# Patient Record
Sex: Male | Born: 1935
Health system: Southern US, Community
[De-identification: ages and names within clinical notes are randomized; demographics above are authoritative.]

## PROBLEM LIST (undated history)

## (undated) DIAGNOSIS — I2699 Other pulmonary embolism without acute cor pulmonale: Secondary | ICD-10-CM

## (undated) DIAGNOSIS — H409 Unspecified glaucoma: Secondary | ICD-10-CM

## (undated) DIAGNOSIS — K5900 Constipation, unspecified: Secondary | ICD-10-CM

## (undated) DIAGNOSIS — I10 Essential (primary) hypertension: Secondary | ICD-10-CM

## (undated) DIAGNOSIS — C439 Malignant melanoma of skin, unspecified: Secondary | ICD-10-CM

## (undated) DIAGNOSIS — E785 Hyperlipidemia, unspecified: Secondary | ICD-10-CM

## (undated) DIAGNOSIS — C61 Malignant neoplasm of prostate: Secondary | ICD-10-CM

## (undated) DIAGNOSIS — I341 Nonrheumatic mitral (valve) prolapse: Secondary | ICD-10-CM

## (undated) DIAGNOSIS — I82409 Acute embolism and thrombosis of unspecified deep veins of unspecified lower extremity: Secondary | ICD-10-CM

## (undated) DIAGNOSIS — J302 Other seasonal allergic rhinitis: Secondary | ICD-10-CM

## (undated) HISTORY — PX: CATARACT EXTRACTION: SUR2

## (undated) HISTORY — DX: Hyperlipidemia, unspecified: E78.5

## (undated) HISTORY — PX: EYE SURGERY: SHX253

## (undated) HISTORY — PX: COLONOSCOPY: SHX174

---

## 1998-05-03 HISTORY — PX: PROSTATECTOMY: SHX69

## 2002-11-21 ENCOUNTER — Encounter: Payer: Self-pay | Admitting: Emergency Medicine

## 2002-11-21 ENCOUNTER — Observation Stay (HOSPITAL_COMMUNITY): Admission: EM | Admit: 2002-11-21 | Discharge: 2002-11-22 | Payer: Self-pay | Admitting: Emergency Medicine

## 2004-10-26 ENCOUNTER — Ambulatory Visit: Payer: Self-pay | Admitting: Internal Medicine

## 2004-11-25 ENCOUNTER — Ambulatory Visit: Payer: Self-pay | Admitting: Internal Medicine

## 2004-12-24 ENCOUNTER — Ambulatory Visit: Payer: Self-pay | Admitting: Internal Medicine

## 2004-12-30 ENCOUNTER — Ambulatory Visit: Payer: Self-pay | Admitting: Internal Medicine

## 2004-12-30 ENCOUNTER — Encounter: Payer: Self-pay | Admitting: Internal Medicine

## 2005-03-04 ENCOUNTER — Ambulatory Visit: Payer: Self-pay | Admitting: Internal Medicine

## 2005-04-05 ENCOUNTER — Ambulatory Visit: Payer: Self-pay | Admitting: Internal Medicine

## 2005-09-14 ENCOUNTER — Ambulatory Visit: Payer: Self-pay | Admitting: Internal Medicine

## 2006-03-15 ENCOUNTER — Ambulatory Visit: Payer: Self-pay | Admitting: Internal Medicine

## 2006-03-25 ENCOUNTER — Ambulatory Visit: Payer: Self-pay | Admitting: Internal Medicine

## 2006-03-25 LAB — CONVERTED CEMR LAB
ALT: 23 units/L (ref 0–40)
BUN: 13 mg/dL (ref 6–23)
Creatinine, Ser: 1.1 mg/dL (ref 0.4–1.5)
LDL Cholesterol: 118 mg/dL — ABNORMAL HIGH (ref 0–99)
VLDL: 32 mg/dL (ref 0–40)

## 2006-06-28 ENCOUNTER — Ambulatory Visit: Payer: Self-pay | Admitting: Internal Medicine

## 2006-06-28 ENCOUNTER — Ambulatory Visit: Payer: Self-pay

## 2006-07-11 ENCOUNTER — Encounter: Admission: RE | Admit: 2006-07-11 | Discharge: 2006-07-11 | Payer: Self-pay | Admitting: Internal Medicine

## 2006-07-11 ENCOUNTER — Ambulatory Visit: Payer: Self-pay | Admitting: Internal Medicine

## 2006-10-04 ENCOUNTER — Encounter: Payer: Self-pay | Admitting: Internal Medicine

## 2006-10-05 DIAGNOSIS — I341 Nonrheumatic mitral (valve) prolapse: Secondary | ICD-10-CM | POA: Insufficient documentation

## 2006-10-27 ENCOUNTER — Ambulatory Visit: Payer: Self-pay | Admitting: Internal Medicine

## 2006-10-27 DIAGNOSIS — I82409 Acute embolism and thrombosis of unspecified deep veins of unspecified lower extremity: Secondary | ICD-10-CM | POA: Insufficient documentation

## 2006-10-27 DIAGNOSIS — Z8546 Personal history of malignant neoplasm of prostate: Secondary | ICD-10-CM | POA: Insufficient documentation

## 2006-10-27 DIAGNOSIS — E785 Hyperlipidemia, unspecified: Secondary | ICD-10-CM | POA: Insufficient documentation

## 2006-12-28 ENCOUNTER — Encounter: Payer: Self-pay | Admitting: Internal Medicine

## 2007-03-09 ENCOUNTER — Ambulatory Visit: Payer: Self-pay | Admitting: Internal Medicine

## 2007-03-13 LAB — CONVERTED CEMR LAB
BUN: 10 mg/dL (ref 6–23)
CO2: 30 meq/L (ref 19–32)
Calcium: 9.7 mg/dL (ref 8.4–10.5)
Cholesterol: 193 mg/dL (ref 0–200)
GFR calc Af Amer: 122 mL/min
GFR calc non Af Amer: 101 mL/min
Glucose, Bld: 95 mg/dL (ref 70–99)
Hemoglobin: 15.9 g/dL (ref 13.0–17.0)
LDL Cholesterol: 122 mg/dL — ABNORMAL HIGH (ref 0–99)
Potassium: 4.7 meq/L (ref 3.5–5.1)
Total CHOL/HDL Ratio: 4.7
Triglycerides: 146 mg/dL (ref 0–149)

## 2007-09-07 ENCOUNTER — Ambulatory Visit: Payer: Self-pay | Admitting: *Deleted

## 2007-09-07 ENCOUNTER — Emergency Department (HOSPITAL_COMMUNITY): Admission: EM | Admit: 2007-09-07 | Discharge: 2007-09-07 | Payer: Self-pay | Admitting: Emergency Medicine

## 2007-09-07 ENCOUNTER — Telehealth: Payer: Self-pay | Admitting: Internal Medicine

## 2007-09-07 ENCOUNTER — Encounter (INDEPENDENT_AMBULATORY_CARE_PROVIDER_SITE_OTHER): Payer: Self-pay | Admitting: Emergency Medicine

## 2007-09-13 ENCOUNTER — Inpatient Hospital Stay (HOSPITAL_COMMUNITY): Admission: AD | Admit: 2007-09-13 | Discharge: 2007-09-16 | Payer: Self-pay | Admitting: Internal Medicine

## 2007-09-13 ENCOUNTER — Ambulatory Visit: Payer: Self-pay | Admitting: Internal Medicine

## 2007-09-13 ENCOUNTER — Ambulatory Visit: Payer: Self-pay | Admitting: Cardiology

## 2007-09-13 ENCOUNTER — Telehealth (INDEPENDENT_AMBULATORY_CARE_PROVIDER_SITE_OTHER): Payer: Self-pay | Admitting: *Deleted

## 2007-09-13 DIAGNOSIS — Z86718 Personal history of other venous thrombosis and embolism: Secondary | ICD-10-CM

## 2007-09-13 LAB — CONVERTED CEMR LAB
BUN: 9 mg/dL (ref 6–23)
Basophils Absolute: 0 10*3/uL (ref 0.0–0.1)
Basophils Relative: 0.1 % (ref 0.0–1.0)
Eosinophils Absolute: 0.1 10*3/uL (ref 0.0–0.7)
Hemoglobin: 15 g/dL (ref 13.0–17.0)
Lymphocytes Relative: 11.5 % — ABNORMAL LOW (ref 12.0–46.0)
MCHC: 34.1 g/dL (ref 30.0–36.0)
MCV: 97.9 fL (ref 78.0–100.0)
Monocytes Absolute: 1.1 10*3/uL — ABNORMAL HIGH (ref 0.1–1.0)
Neutro Abs: 6 10*3/uL (ref 1.4–7.7)
RBC: 4.49 M/uL (ref 4.22–5.81)
RDW: 11.5 % (ref 11.5–14.6)

## 2007-09-14 ENCOUNTER — Ambulatory Visit: Payer: Self-pay | Admitting: Vascular Surgery

## 2007-09-14 ENCOUNTER — Encounter: Payer: Self-pay | Admitting: Internal Medicine

## 2007-09-14 ENCOUNTER — Ambulatory Visit: Payer: Self-pay | Admitting: Internal Medicine

## 2007-09-18 ENCOUNTER — Telehealth (INDEPENDENT_AMBULATORY_CARE_PROVIDER_SITE_OTHER): Payer: Self-pay | Admitting: *Deleted

## 2007-09-18 ENCOUNTER — Ambulatory Visit: Payer: Self-pay | Admitting: Internal Medicine

## 2007-09-22 ENCOUNTER — Ambulatory Visit: Payer: Self-pay | Admitting: Internal Medicine

## 2007-09-22 DIAGNOSIS — Z86711 Personal history of pulmonary embolism: Secondary | ICD-10-CM | POA: Insufficient documentation

## 2007-09-26 ENCOUNTER — Ambulatory Visit: Payer: Self-pay | Admitting: Internal Medicine

## 2007-09-26 LAB — CONVERTED CEMR LAB
INR: 8
Prothrombin Time: 33.9 s

## 2007-10-03 ENCOUNTER — Ambulatory Visit: Payer: Self-pay | Admitting: Internal Medicine

## 2007-10-03 LAB — CONVERTED CEMR LAB
INR: 2.3
Prothrombin Time: 18.6 s

## 2007-10-11 ENCOUNTER — Ambulatory Visit: Payer: Self-pay | Admitting: Internal Medicine

## 2007-10-11 LAB — CONVERTED CEMR LAB
INR: 3.4
Prothrombin Time: 22.4 s

## 2007-10-20 ENCOUNTER — Ambulatory Visit: Payer: Self-pay | Admitting: Internal Medicine

## 2007-10-30 ENCOUNTER — Ambulatory Visit: Payer: Self-pay | Admitting: Internal Medicine

## 2007-10-30 ENCOUNTER — Inpatient Hospital Stay (HOSPITAL_COMMUNITY): Admission: EM | Admit: 2007-10-30 | Discharge: 2007-10-30 | Payer: Self-pay | Admitting: Emergency Medicine

## 2007-11-01 ENCOUNTER — Telehealth (INDEPENDENT_AMBULATORY_CARE_PROVIDER_SITE_OTHER): Payer: Self-pay | Admitting: *Deleted

## 2007-11-01 ENCOUNTER — Ambulatory Visit: Payer: Self-pay | Admitting: Internal Medicine

## 2007-11-01 ENCOUNTER — Encounter (INDEPENDENT_AMBULATORY_CARE_PROVIDER_SITE_OTHER): Payer: Self-pay | Admitting: *Deleted

## 2007-11-01 LAB — CONVERTED CEMR LAB: INR: 2.1

## 2007-11-02 ENCOUNTER — Telehealth (INDEPENDENT_AMBULATORY_CARE_PROVIDER_SITE_OTHER): Payer: Self-pay | Admitting: *Deleted

## 2007-11-09 ENCOUNTER — Ambulatory Visit: Payer: Self-pay | Admitting: Internal Medicine

## 2007-12-11 ENCOUNTER — Ambulatory Visit: Payer: Self-pay | Admitting: Internal Medicine

## 2007-12-11 LAB — CONVERTED CEMR LAB
INR: 2
Prothrombin Time: 17.5 s

## 2007-12-22 ENCOUNTER — Ambulatory Visit: Payer: Self-pay | Admitting: Internal Medicine

## 2007-12-22 LAB — CONVERTED CEMR LAB
INR: 1.5
Prothrombin Time: 14.9 s

## 2008-01-09 ENCOUNTER — Ambulatory Visit: Payer: Self-pay | Admitting: Internal Medicine

## 2008-01-09 LAB — CONVERTED CEMR LAB
INR: 2.5
Prothrombin Time: 19.2 s

## 2008-01-30 ENCOUNTER — Ambulatory Visit: Payer: Self-pay | Admitting: Internal Medicine

## 2008-02-06 ENCOUNTER — Telehealth: Payer: Self-pay | Admitting: Internal Medicine

## 2008-02-07 ENCOUNTER — Telehealth (INDEPENDENT_AMBULATORY_CARE_PROVIDER_SITE_OTHER): Payer: Self-pay | Admitting: *Deleted

## 2008-02-09 ENCOUNTER — Ambulatory Visit: Payer: Self-pay | Admitting: Internal Medicine

## 2008-02-09 ENCOUNTER — Telehealth: Payer: Self-pay | Admitting: Internal Medicine

## 2008-02-09 LAB — CONVERTED CEMR LAB: INR: 1.8

## 2008-02-16 ENCOUNTER — Ambulatory Visit: Payer: Self-pay | Admitting: Internal Medicine

## 2008-02-16 LAB — CONVERTED CEMR LAB: INR: 2.3

## 2008-02-20 ENCOUNTER — Encounter: Payer: Self-pay | Admitting: Internal Medicine

## 2008-03-14 ENCOUNTER — Ambulatory Visit: Payer: Self-pay | Admitting: Internal Medicine

## 2008-03-14 LAB — CONVERTED CEMR LAB
INR: 2.6
Prothrombin Time: 19.7 s

## 2008-04-15 ENCOUNTER — Ambulatory Visit: Payer: Self-pay | Admitting: Internal Medicine

## 2008-04-15 LAB — CONVERTED CEMR LAB: Prothrombin Time: 17.1 s

## 2008-04-23 ENCOUNTER — Ambulatory Visit: Payer: Self-pay | Admitting: Internal Medicine

## 2008-04-29 ENCOUNTER — Ambulatory Visit: Payer: Self-pay | Admitting: Internal Medicine

## 2008-05-06 LAB — CONVERTED CEMR LAB
ALT: 22 units/L (ref 0–53)
AST: 25 units/L (ref 0–37)
CO2: 29 meq/L (ref 19–32)
Calcium: 9.5 mg/dL (ref 8.4–10.5)
Direct LDL: 124.7 mg/dL
GFR calc Af Amer: 106 mL/min
HDL: 33.8 mg/dL — ABNORMAL LOW (ref >39.0)
Potassium: 4 meq/L (ref 3.5–5.1)
Sodium: 141 meq/L (ref 135–145)
Total CHOL/HDL Ratio: 6
Triglycerides: 202 mg/dL (ref 0–149)

## 2008-05-24 ENCOUNTER — Ambulatory Visit: Payer: Self-pay | Admitting: Internal Medicine

## 2008-06-24 ENCOUNTER — Ambulatory Visit: Payer: Self-pay | Admitting: Internal Medicine

## 2008-07-22 ENCOUNTER — Ambulatory Visit: Payer: Self-pay | Admitting: Internal Medicine

## 2008-07-22 LAB — CONVERTED CEMR LAB: INR: 18

## 2008-08-23 ENCOUNTER — Ambulatory Visit: Payer: Self-pay | Admitting: Internal Medicine

## 2008-08-23 LAB — CONVERTED CEMR LAB: INR: 2.2

## 2008-09-23 ENCOUNTER — Ambulatory Visit: Payer: Self-pay | Admitting: Internal Medicine

## 2008-09-23 LAB — CONVERTED CEMR LAB
INR: 3.1
Prothrombin Time: 21.1 s

## 2008-10-22 ENCOUNTER — Ambulatory Visit: Payer: Self-pay | Admitting: Internal Medicine

## 2008-10-22 LAB — CONVERTED CEMR LAB
INR: 2.1
Prothrombin Time: 17.9 s

## 2008-11-25 ENCOUNTER — Ambulatory Visit: Payer: Self-pay | Admitting: Internal Medicine

## 2008-11-28 ENCOUNTER — Telehealth (INDEPENDENT_AMBULATORY_CARE_PROVIDER_SITE_OTHER): Payer: Self-pay | Admitting: *Deleted

## 2008-12-20 ENCOUNTER — Ambulatory Visit: Payer: Self-pay | Admitting: Internal Medicine

## 2009-01-17 ENCOUNTER — Ambulatory Visit: Payer: Self-pay | Admitting: Internal Medicine

## 2009-01-17 LAB — CONVERTED CEMR LAB
INR: 2.7
Prothrombin Time: 19.8 s

## 2009-02-14 ENCOUNTER — Ambulatory Visit: Payer: Self-pay | Admitting: Internal Medicine

## 2009-02-14 LAB — CONVERTED CEMR LAB
INR: 2.5
Prothrombin Time: 19.2 s

## 2009-03-14 ENCOUNTER — Ambulatory Visit: Payer: Self-pay | Admitting: Family Medicine

## 2009-03-14 LAB — CONVERTED CEMR LAB: Prothrombin Time: 15.6 s

## 2009-03-21 ENCOUNTER — Ambulatory Visit: Payer: Self-pay | Admitting: Internal Medicine

## 2009-03-31 ENCOUNTER — Ambulatory Visit: Payer: Self-pay | Admitting: Internal Medicine

## 2009-04-08 ENCOUNTER — Encounter: Payer: Self-pay | Admitting: Internal Medicine

## 2009-04-08 LAB — CONVERTED CEMR LAB: PSA: <0.04 ng/mL

## 2009-04-18 ENCOUNTER — Encounter: Payer: Self-pay | Admitting: Internal Medicine

## 2009-04-21 ENCOUNTER — Ambulatory Visit: Payer: Self-pay | Admitting: Internal Medicine

## 2009-04-21 LAB — CONVERTED CEMR LAB
INR: 2.8 — ABNORMAL HIGH (ref 0.8–1.0)
Prothrombin Time: 29.2 s — ABNORMAL HIGH (ref 9.1–11.7)

## 2009-04-22 ENCOUNTER — Encounter: Payer: Self-pay | Admitting: Internal Medicine

## 2009-05-19 ENCOUNTER — Ambulatory Visit: Payer: Self-pay | Admitting: Hematology & Oncology

## 2009-05-19 ENCOUNTER — Ambulatory Visit: Payer: Self-pay | Admitting: Internal Medicine

## 2009-05-19 LAB — CONVERTED CEMR LAB
INR: 3
Prothrombin Time: 21 s

## 2009-05-30 ENCOUNTER — Encounter: Payer: Self-pay | Admitting: Internal Medicine

## 2009-05-30 LAB — CBC WITH DIFFERENTIAL (CANCER CENTER ONLY)
EOS%: 6.5 % (ref 0.0–7.0)
Eosinophils Absolute: 0.3 10*3/uL (ref 0.0–0.5)
LYMPH%: 27.9 % (ref 14.0–48.0)
MCH: 34 pg — ABNORMAL HIGH (ref 28.0–33.4)
MCHC: 35.1 g/dL (ref 32.0–35.9)
MCV: 97 fL (ref 82–98)
MONO%: 9.2 % (ref 0.0–13.0)
NEUT#: 2.3 10*3/uL (ref 1.5–6.5)
Platelets: 162 10*3/uL (ref 145–400)
RBC: 4.43 10*6/uL (ref 4.20–5.70)
RDW: 11 % (ref 10.5–14.6)

## 2009-06-02 ENCOUNTER — Ambulatory Visit: Payer: Self-pay | Admitting: Internal Medicine

## 2009-06-02 DIAGNOSIS — G4700 Insomnia, unspecified: Secondary | ICD-10-CM

## 2009-06-02 LAB — D-DIMER, QUANTITATIVE: D-Dimer, Quant: 0.22 ug/mL-FEU (ref 0.00–0.48)

## 2009-06-04 LAB — JAK2 GENOTYPR: JAK2 GenotypR: NOT DETECTED

## 2009-06-05 ENCOUNTER — Ambulatory Visit: Payer: Self-pay | Admitting: Internal Medicine

## 2009-06-05 LAB — HYPERCOAGULABLE PANEL, COMPREHENSIVE
Anticardiolipin IgA: <3 APL U/mL (ref <10)
Anticardiolipin IgM: <3 MPL U/mL (ref <10)
Beta-2 Glyco I IgG: <3 U/mL (ref <=15)
Beta-2-Glycoprotein I IgM: <3 U/mL (ref <=15)
DRVVT 1:1 Mix: 40.5 secs (ref 36.1–47.0)
PTTLA 4:1 Mix: 40.5 secs (ref 36.3–48.8)
Protein C Activity: <15 % (ref 75–133)
Protein C, Total: 51 % — ABNORMAL LOW (ref 70–140)
Protein S Activity: 29 % — ABNORMAL LOW (ref 69–129)

## 2009-06-09 LAB — CONVERTED CEMR LAB
ALT: 33 units/L (ref 0–53)
AST: 30 units/L (ref 0–37)
Calcium: 9.7 mg/dL (ref 8.4–10.5)
Chloride: 106 meq/L (ref 96–112)
Creatinine, Ser: 1.1 mg/dL (ref 0.4–1.5)
GFR calc non Af Amer: 69.27 mL/min (ref >60)
Potassium: 4.6 meq/L (ref 3.5–5.1)
VLDL: 35 mg/dL (ref 0.0–40.0)

## 2009-06-16 ENCOUNTER — Ambulatory Visit: Payer: Self-pay | Admitting: Internal Medicine

## 2009-06-16 LAB — CONVERTED CEMR LAB: INR: 3.9 — ABNORMAL HIGH (ref 0.8–1.0)

## 2009-06-30 ENCOUNTER — Ambulatory Visit: Payer: Self-pay | Admitting: Internal Medicine

## 2009-06-30 LAB — CONVERTED CEMR LAB: INR: 2.6

## 2009-07-21 ENCOUNTER — Ambulatory Visit: Payer: Self-pay | Admitting: Internal Medicine

## 2009-08-18 ENCOUNTER — Ambulatory Visit: Payer: Self-pay | Admitting: Internal Medicine

## 2009-09-15 ENCOUNTER — Ambulatory Visit: Payer: Self-pay

## 2009-09-15 ENCOUNTER — Ambulatory Visit: Payer: Self-pay | Admitting: Internal Medicine

## 2009-09-19 ENCOUNTER — Encounter: Payer: Self-pay | Admitting: Internal Medicine

## 2009-10-30 ENCOUNTER — Ambulatory Visit: Payer: Self-pay | Admitting: Family

## 2009-10-30 ENCOUNTER — Telehealth (INDEPENDENT_AMBULATORY_CARE_PROVIDER_SITE_OTHER): Payer: Self-pay | Admitting: *Deleted

## 2009-10-30 ENCOUNTER — Ambulatory Visit (HOSPITAL_BASED_OUTPATIENT_CLINIC_OR_DEPARTMENT_OTHER): Admission: RE | Admit: 2009-10-30 | Discharge: 2009-10-30 | Payer: Self-pay | Admitting: Internal Medicine

## 2009-10-30 ENCOUNTER — Ambulatory Visit: Payer: Self-pay | Admitting: Diagnostic Radiology

## 2009-10-30 LAB — CONVERTED CEMR LAB
BUN: 19 mg/dL (ref 6–23)
CO2: 24 meq/L (ref 19–32)
Calcium: 9.5 mg/dL (ref 8.4–10.5)
Creatinine, Ser: 0.98 mg/dL (ref 0.40–1.50)
Glucose, Bld: 100 mg/dL — ABNORMAL HIGH (ref 70–99)

## 2009-12-15 ENCOUNTER — Ambulatory Visit: Payer: Self-pay | Admitting: Internal Medicine

## 2009-12-15 ENCOUNTER — Ambulatory Visit: Payer: Self-pay | Admitting: Diagnostic Radiology

## 2009-12-15 ENCOUNTER — Ambulatory Visit (HOSPITAL_BASED_OUTPATIENT_CLINIC_OR_DEPARTMENT_OTHER): Admission: RE | Admit: 2009-12-15 | Discharge: 2009-12-15 | Payer: Self-pay | Admitting: Internal Medicine

## 2009-12-15 DIAGNOSIS — M1990 Unspecified osteoarthritis, unspecified site: Secondary | ICD-10-CM

## 2010-02-09 ENCOUNTER — Telehealth (INDEPENDENT_AMBULATORY_CARE_PROVIDER_SITE_OTHER): Payer: Self-pay | Admitting: *Deleted

## 2010-03-13 ENCOUNTER — Telehealth (INDEPENDENT_AMBULATORY_CARE_PROVIDER_SITE_OTHER): Payer: Self-pay | Admitting: *Deleted

## 2010-03-13 ENCOUNTER — Ambulatory Visit: Payer: Self-pay | Admitting: Internal Medicine

## 2010-03-13 DIAGNOSIS — M549 Dorsalgia, unspecified: Secondary | ICD-10-CM | POA: Insufficient documentation

## 2010-03-16 ENCOUNTER — Encounter: Admission: RE | Admit: 2010-03-16 | Discharge: 2010-03-16 | Payer: Self-pay | Admitting: Internal Medicine

## 2010-03-17 ENCOUNTER — Telehealth: Payer: Self-pay | Admitting: Internal Medicine

## 2010-04-14 ENCOUNTER — Ambulatory Visit: Payer: Self-pay | Admitting: Internal Medicine

## 2010-05-28 ENCOUNTER — Telehealth: Payer: Self-pay | Admitting: Internal Medicine

## 2010-06-02 NOTE — Consult Note (Signed)
Summary: hematology --d/c coumadin 5-11  [facility]   Imported By: Lanelle Bal 06/30/2009 07:57:35  _____________________________________________________________________  External Attachment:    Type:   Image     Comment:   External Document

## 2010-06-02 NOTE — Progress Notes (Signed)
Summary: question about conflict of his two new meds for back  Phone Note Call from Patient Call back at Home Phone [phone]   Caller: Patient Summary of Call: patient called here after he picked up the two medications that Dr Drue Novel prescribed for him this morning---says he was reading enclosed paperwork and he says he has a conflict between these medications and the full aspirin that he takes every evening---I didnt see aspirin on his meds list  needs Dr Drue Novel to advise him--ok to take aspirin with these two new meds?? or discontinue while on these two meds?? Initial call taken by: Jerolyn Shin,  March 13, 2010 11:45 AM  Follow-up for Phone Call        Please advise. Lucious Groves CMA  March 13, 2010 1:17 PM    sometimes NSAIDs like meloxicam affect blood thinning properties of aspirin.  At this point I think he will benefit from meloxicam. I recommend him to  take the medications as prescribed Jose E. Paz MD,  March 13, 2010 4:11 PM  Additional Follow-up for Phone Call Additional follow up Details #1::        Patient aware of Dr.Paz's instructions and ok'd Additional Follow-up by: Shonna Chock CMA,  March 13, 2010 5:10 PM

## 2010-06-02 NOTE — Assessment & Plan Note (Signed)
Summary: pt/inr/swh  Nurse Visit   Vital Signs:  Patient profile:   77 year old male Weight:      168.6 pounds BP sitting:   120 / 80  Vitals Entered By: Shary Decamp (June 30, 2009 9:52 AM)  Allergies: 1)  ! Sulfa Laboratory Results   Blood Tests      INR: 2.6   (Normal Range: 0.88-1.12   Therap INR: 2.0-3.5) Comments: PREVIOUS INR: 3.9 06/16/09 CURRENT DOSE: 5MG  TABLETS - 1/2 tablet daily No change Next INR is scheduled for 07/21/09... ok?? Shary Decamp  June 30, 2009 9:54 AM yes Nolon Rod. Gokul Waybright MD  June 30, 2009 6:44 PM     Orders Added: 1)  Est. Patient Level I [99211] 2)  Protime [09811BJ]

## 2010-06-02 NOTE — Progress Notes (Signed)
Summary: REFILL REQUEST  Phone Note Refill Request Message from:  Patient on February 09, 2010 11:21 AM  Refills Requested: Medication #1:  ATENOLOL 50 MG  TABS 1 tab once daily   Dosage confirmed as above?Dosage Confirmed   Supply Requested: 3 months  Medication #2:  PRAVACHOL 20 MG TABS 1 by mouth qhs   Dosage confirmed as above?Dosage Confirmed   Supply Requested: 3 months [facility]  Next Appointment Scheduled: 04/14/10 Initial call taken by: Lavell Islam,  February 09, 2010 11:22 AM    Prescriptions: ATENOLOL 50 MG  TABS (ATENOLOL) 1 tab once daily  #90 x 3   Entered by:   Army Fossa CMA   Authorized by:   Nolon Rod. Paz MD   Signed by:   Army Fossa CMA on 02/09/2010   Method used:   Electronically to        [facility]* (mail-order)       [address]       [place], [place]  [address]       Ph: [phone]       Fax: [phone]   RxID:   [id number] PRAVACHOL 20 MG TABS (PRAVASTATIN SODIUM) 1 by mouth qhs  #90 x 3   Entered by:   Army Fossa CMA   Authorized by:   Nolon Rod. Paz MD   Signed by:   Army Fossa CMA on 02/09/2010   Method used:   Electronically to        [facility]* (mail-order)       [address]       [place], [place]  [address]       Ph: [phone]       Fax: [phone]   RxID:   [id number]

## 2010-06-02 NOTE — Assessment & Plan Note (Signed)
Summary: PT CHECK--refer to hematology--needs OV---/KDC  Nurse Visit   Vital Signs:  Patient profile:   77 year old male Height:      73 inches Weight:      167 pounds BMI:     22.11 Pulse rate:   70 / minute BP sitting:   130 / 90  Vitals Entered By: Shary Decamp (May 19, 2009 9:03 AM) CC: INR check   Allergies: 1)  ! Sulfa Laboratory Results   Blood Tests     PT: 21.0 s   (Normal Range: 10.6-13.4)  INR: 3.0   (Normal Range: 0.88-1.12   Therap INR: 2.0-3.5) Comments: PREVIOUS INR 2.8 (04/21/2009) CURRENT DOSE: 5mg  tablets - 1/2 by mouth once daily except 1 on fri No change Patient scheduled next PT/INR check for 4 weeks.....ok? Shary Decamp  May 19, 2009 9:05 AM yes Muscab Brenneman E. Christien Berthelot MD  May 19, 2009 9:49 AM     Impression & Recommendations:  Problem # 1:  PE (ICD-415.19) last seen 12-09 needs OV (yearly) also will refer to hematology: ?coumadin for life  send my last 2 OV notes  His updated medication list for this problem includes:    Warfarin Sodium 5 Mg Tabs (Warfarin sodium) .Marland Kitchen... As directed  Orders: Protime (16109UE) Hematology Referral (Hematology)  discussed with pt, he has cpx scheduled for 06/02/09.  Hematology referral done [facility]  May 20, 2009 2:23 PM  Complete Medication List: 1)  Atenolol 50 Mg Tabs (Atenolol) .Marland Kitchen.. 1 tab once daily 2)  Glucosamine-chondroitin Tabs (Glucosamine-chondroitin tabs) 3)  Enulose 10 Gm/62ml Soln (Lactulose encephalopathy) .... 2 tsp two times a day prn 4)  Mvi  5)  Antihistimine  .... Prn 6)  Tylenol  .... Prn 7)  Folic Acid  8)  Vicodin Es 4.5-409 Mg Tabs (Hydrocodone-acetaminophen) .Marland Kitchen.. 1 -2 q 6 hrs as needed pain 9)  Alprazolam 0.5 Mg Tabs (Alprazolam) .Marland Kitchen.. 1-1 1/2 by mouth at bedtime as needed insomnia 10)  Warfarin Sodium 5 Mg Tabs (Warfarin sodium) .... As directed 11)  Flexeril 10 Mg Tabs (Cyclobenzaprine hcl) .Marland Kitchen.. 1 by mouth at bedtime 12)  Pravachol 20 Mg Tabs (Pravastatin sodium)  .Marland Kitchen.. 1 by mouth qhs   Orders Added: 1)  Est. Patient Level I [81191] 2)  Protime [47829FA] 3)  Hematology Referral [Hematology]

## 2010-06-02 NOTE — Assessment & Plan Note (Signed)
Summary: pt check/kdc  Nurse Visit   Vital Signs:  Patient profile:   77 year old male Height:      73 inches Weight:      168 pounds BP sitting:   120 / 80  Vitals Entered By: Shary Decamp (July 21, 2009 8:23 AM) CC: inr check   Allergies: 1)  ! Sulfa Laboratory Results   Blood Tests      INR: 2.4   (Normal Range: 0.88-1.12   Therap INR: 2.0-3.5) Comments: CURRENT DOSE: 5mg  tablets - 1/2 by mouth once daily No change recheck in 4 weeks Shary Decamp  July 21, 2009 8:24 AM     Orders Added: 1)  Est. Patient Level I [47829] 2)  Protime [56213YQ]

## 2010-06-02 NOTE — Miscellaneous (Signed)
Summary: Orders Update  Clinical Lists Changes  Problems: Added new problem of CALF PAIN, RIGHT (ICD-729.5) Orders: Added new Test order of Venous Duplex Lower Extremity (Venous Duplex Lower) - Signed 

## 2010-06-02 NOTE — Consult Note (Signed)
Summary: likely d[facility] coumadin 08-2009[facility]  [facility]   Imported By: Lanelle Bal 06/19/2009 12:23:40  _____________________________________________________________________  External Attachment:    Type:   Image     Comment:   External Document

## 2010-06-02 NOTE — Assessment & Plan Note (Signed)
Summary: PT CHECK///SPH  Nurse Visit   Vital Signs:  Patient profile:   77 year old male Height:      73 inches Weight:      168 pounds BP sitting:   130 / 80  Vitals Entered By: Shary Decamp (August 18, 2009 9:12 AM)   Allergies: 1)  ! Sulfa Laboratory Results   Blood Tests      INR: 2.1   (Normal Range: 0.88-1.12   Therap INR: 2.0-3.5) Comments: CURRENT DOSE:  5mg  tablets - 1/2 by mouth once daily No change ??d/c coumadin in may?? Shary Decamp  August 18, 2009 9:14 AM continue with same dose, D/C Coumadin in one month!!  ( hematology consult) office visit in 3 months patient to let me know if there is any problems with leg swelling, chest pain or shortness of breath Frieda Arnall E. Taje Tondreau MD  August 19, 2009 1:32 PM  discussed with pt Shary Decamp  August 20, 2009 10:00 AM     Orders Added: 1)  Est. Patient Level I [16109] 2)  Protime [[id number]] Prescriptions: ATENOLOL 50 MG  TABS (ATENOLOL) 1 tab once daily  #90 x 1   Entered by:   Shary Decamp   Authorized by:   Nolon Rod. Shemeca Lukasik MD   Signed by:   Shary Decamp on 08/18/2009   Method used:   Faxed to ...       [facility])             , [place]         Ph:        Fax: [phone]   RxID:   [id number] PRAVACHOL 20 MG TABS (PRAVASTATIN SODIUM) 1 by mouth qhs  #90 x 1   Entered by:   Shary Decamp   Authorized by:   Nolon Rod. Aerion Bagdasarian MD   Signed by:   Shary Decamp on 08/18/2009   Method used:   Faxed to ...       [facility]order)             , [place]         Ph:        Fax: [phone]   RxID:   [id number]

## 2010-06-02 NOTE — Assessment & Plan Note (Signed)
Summary: yearly--NS/KDC   Vital Signs:  Patient profile:   77 year old male Height:      73 inches Weight:      168.6 pounds Pulse rate:   76 / minute BP sitting:   120 / 80  Vitals Entered By: Shary Decamp (June 02, 2009 1:19 PM) CC: yearly - not fasting Comments  - saw Dr. Myna Hidalgo last week, had CBC, d-dimer, coagu panel drawn  - had PSA 12/10 by urology but pt has been cleared from urology for 10 years Shary Decamp  June 02, 2009 1:33 PM    History of Present Illness: h/o DVT PE---saw Dr. Myna Hidalgo last week, had CBC, d-dimer, coagu panel drawn. Plans to d/c coumadin May 2011  h/o prostate ca --- had PSA 12/10 by urology but pt has been cleared from urology for 10 years; will just need a PSA yearly  hyperlipidemia-good medication compliance  MVP-- on atenolol  insomnia-- rarely takes xanax  back pain-- uses pain meds rarely   Allergies: 1)  ! Sulfa  Past History:  Past Medical History: MVP Prostate cancer  Hyperlipidemia DVT, hx of 2001 (after prostate surgery) PE 5-09: coumadin for life? (will be refered to hematology at some point)  Past Surgical History: Reviewed history from 04/23/2008 and no changes required. Prostatectomy ( aprox. 2000) Cataract extraction  Family History: Reviewed history from 09/13/2007 and no changes required.  CAD-- father have heart disease on his mid 89s DM--no prostate ca--no colon cancer--no  Social History: Reviewed history from 10/27/2006 and no changes required. Never Smoked Alcohol use-no Married no children diet-- healthy for the most part  exercise-- no  Review of Systems General:  Denies fever and weight loss. CV:  Denies chest pain or discomfort and swelling of feet. Resp:  Denies cough and shortness of breath. GI:  Denies bloody stools, nausea, and vomiting. GU:  Denies hematuria, urinary frequency, and urinary hesitancy. Psych:  Denies anxiety and depression.  Physical Exam  General:  alert,  well-developed, and well-nourished.   Neck:  no masses, no thyromegaly, and normal carotid upstroke.   Lungs:  normal respiratory effort, no intercostal retractions, no accessory muscle use, and normal breath sounds.   Heart:  normal rate, regular rhythm, and no murmur.   Abdomen:  soft, non-tender, normal bowel sounds, no distention, and no masses.  no bruit Extremities:  no edema Psych:  Oriented X3, memory intact for recent and remote, normally interactive, good eye contact, not anxious appearing, and not depressed appearing.     Impression & Recommendations:  Problem # 1:  PE (ICD-415.19) just a slow hematology  Plan is to a stop Coumadin in May 2011 if the labs are fine His updated medication list for this problem includes:    Warfarin Sodium 5 Mg Tabs (Warfarin sodium) .Marland Kitchen... As directed  Problem # 2:  HEALTH SCREENING (ICD-V70.0) TD 2002 pneumonia shot 2006 shingles shot: had already    cscope 01-2005, had tics, next 2016  rec:  increase  exercise and continue with his healthy diet    Problem # 3:  HYPERLIPIDEMIA (ICD-272.4) labs  His updated medication list for this problem includes:    Pravachol 20 Mg Tabs (Pravastatin sodium) .Marland Kitchen... 1 by mouth qhs  Labs Reviewed: SGOT: 25 (04/29/2008)   SGPT: 22 (04/29/2008)   HDL:33.8 (04/29/2008), 41.5 (03/09/2007)  LDL:DEL (04/29/2008), 122 (03/09/2007)  Chol:204 (04/29/2008), 193 (03/09/2007)  Trig:202 (04/29/2008), 146 (03/09/2007)  Problem # 4:  PROSTATE CANCER, HX OF (ICD-V10.46) cleared  by  urology from now on  will need a PSA yearly here.  Next PSA 1- 2012  Problem # 5:  INSOMNIA-SLEEP DISORDER-UNSPEC (ICD-780.52) continue xanax prn  Complete Medication List: 1)  Pravachol 20 Mg Tabs (Pravastatin sodium) .Marland Kitchen.. 1 by mouth qhs 2)  Atenolol 50 Mg Tabs (Atenolol) .Marland Kitchen.. 1 tab once daily 3)  Alprazolam 0.5 Mg Tabs (Alprazolam) .Marland Kitchen.. 1-1 1/2 by mouth at bedtime as needed insomnia 4)  Glucosamine-chondroitin Tabs  (Glucosamine-chondroitin tabs) 5)  Enulose 10 Gm/97ml Soln (Lactulose encephalopathy) .... 2 tsp two times a day prn 6)  Mvi  7)  Antihistimine  .... Prn 8)  Tylenol  .... Prn 9)  Folic Acid  10)  Vicodin Es 7.5-750 Mg Tabs (Hydrocodone-acetaminophen) .Marland Kitchen.. 1 -2 q 6 hrs as needed pain 11)  Flexeril 10 Mg Tabs (Cyclobenzaprine hcl) .Marland Kitchen.. 1 by mouth at bedtime 12)  Warfarin Sodium 5 Mg Tabs (Warfarin sodium) .... As directed  Patient Instructions: 1)  came  back fasting: 2)  FLP, AST, ALT, BMP, TSH Dx hyperlipidemia 3)  Please schedule a follow-up appointment in 6 months .    Preventive Care Screening  Colonoscopy:    Date:  12/30/2004    Results:  Diverticulosis, hemorrhoids (internal)   Prior Values:    PSA:  <0.04 (04/08/2009)    Last Tetanus Booster:  Td (01/01/2001)    Last Flu Shot:  hat it elsewhere (04/23/2008)    Last Pneumovax:  Pneumovax (03/03/2005)

## 2010-06-02 NOTE — Progress Notes (Signed)
----   Converted from flag ---- ---- 03/17/2010 11:32 AM, Army Fossa CMA wrote: Please cancel orthopedic surgeon referral. ------------------------------

## 2010-06-02 NOTE — Assessment & Plan Note (Signed)
Summary: 6 MTH FU/NS/KDC   Vital Signs:  Patient profile:   77 year old male Weight:      170.25 pounds Pulse rate:   67 / minute Pulse rhythm:   regular BP sitting:   118 / 82  (left arm) Cuff size:   regular  Vitals Entered By: Army Fossa CMA (December 15, 2009 12:43 PM) CC: 6 month f/u :not fasting Comments Still having back and hip pain.   History of Present Illness:  routine office visit patient complained of right hip pain for the last 2 months, he has a history of DJD, he suspects  that's the problem. He takes Advil 2 or 3 times a day and Tylenol less than 3000 mg daily with relatively good control of the symptoms Pain decrease with inactivity and increased with walking. no back pain per se  ROS Wife has advanced melanoma, Zayvier  is obviously concerned, his counselor today He saw his urologist, he follow he for prostate cancer for 10 years, he was released from urology care. Needs yearly exam here from this point on  Current Medications (verified): 1)  Pravachol 20 Mg Tabs (Pravastatin Sodium) .Marland Kitchen.. 1 By Mouth Qhs 2)  Atenolol 50 Mg  Tabs (Atenolol) .Marland Kitchen.. 1 Tab Once Daily 3)  Alprazolam 0.5 Mg  Tabs (Alprazolam) .Marland Kitchen.. 1-1 1/2 By Mouth At Bedtime As Needed Insomnia 4)  Glucosamine-Chondroitin   Tabs (Glucosamine-Chondroitin Tabs) 5)  Enulose 10 Gm/41ml  Soln (Lactulose Encephalopathy) .... 2 Tsp Two Times A Day Prn 6)  Mvi 7)  Antihistimine .... Prn 8)  Tylenol .... Prn 9)  Folic Acid 10)  Flexeril 10 Mg Tabs (Cyclobenzaprine Hcl) .Marland Kitchen.. 1 By Mouth At Bedtime 11)  Equate  Liqd (Nutritional Supplements) .... As Needed  Allergies: 1)  ! Sulfa  Past History:  Past Medical History: Reviewed history from 09/15/2009 and no changes required. MVP Prostate cancer  Hyperlipidemia DVT, hx of 2001 (after prostate surgery) PE 5-09:was referred to hematology, and they recommend to discontinue Coumadin 5/11  Past Surgical History: Reviewed history from 04/23/2008 and no  changes required. Prostatectomy ( aprox. 2000) Cataract extraction  Social History: Never Smoked Alcohol use-no Married, wife w/ melanoma, advanced  no children diet-- healthy for the most part  exercise-- no  Review of Systems CV:  Denies chest pain or discomfort and swelling of feet. Resp:  Denies cough and shortness of breath.  Physical Exam  General:  alert and well-developed.   Neck:  no masses.   Lungs:  Normal respiratory effort, chest expands symmetrically. Lungs are clear to auscultation, no crackles or wheezes. Heart:  Normal rate and regular rhythm. S1 and S2 normal without gallop, murmur, click, rub or other extra sounds. Extremities:  no edema   Impression & Recommendations:  Problem # 1:  DEGENERATIVE JOINT DISEASE (ICD-715.90) hip pain likely related to DJD see instructions The following medications were removed from the medication list:    Vicodin Es 7.5-750 Mg Tabs (Hydrocodone-acetaminophen) .Marland Kitchen... 1 -2 q 6 hrs as needed pain  Orders: T-Hip Comp Right Min 2 views (73510TC)  Problem # 2:  DVT, HX OF (ICD-V12.51) off Coumadin The following medications were removed from the medication list:    Warfarin Sodium 5 Mg Tabs (Warfarin sodium) .Marland Kitchen... As directed  Problem # 3:  PROSTATE CANCER, HX OF (ICD-V10.46) patient was followup by urology for 10 years, last followup was negative. Will have regular checkups with me from now on. Return to urology p.r.n.  Problem # 4:  wife with melanoma counseled   Complete Medication List: 1)  Pravachol 20 Mg Tabs (Pravastatin sodium) .Marland Kitchen.. 1 by mouth qhs 2)  Atenolol 50 Mg Tabs (Atenolol) .Marland Kitchen.. 1 tab once daily 3)  Alprazolam 0.5 Mg Tabs (Alprazolam) .Marland Kitchen.. 1-1 1/2 by mouth at bedtime as needed insomnia 4)  Glucosamine-chondroitin Tabs (Glucosamine-chondroitin tabs) 5)  Enulose 10 Gm/48ml Soln (Lactulose encephalopathy) .... 2 tsp two times a day prn 6)  Mvi  7)  Antihistimine  .... Prn 8)  Tylenol  .... Prn 9)  Folic  Acid  10)  Flexeril 10 Mg Tabs (Cyclobenzaprine hcl) .Marland Kitchen.. 1 by mouth at bedtime 11)  Equate Liqd (Nutritional supplements) .... As needed  Patient Instructions: 1)  keep tylenol less than 4,000 mg a day 2)  kep advil to less than 3 tablets a day 3)  watch for side effects, stomach irritation 4)  Please schedule a follow-up appointment in 4 months .  Prescriptions: ENULOSE 10 GM/15ML  SOLN (LACTULOSE ENCEPHALOPATHY) 2 tsp two times a day prn  #3 month supp x 1   Entered by:   Army Fossa CMA   Authorized by:   Nolon Rod. Paz MD   Signed by:   Army Fossa CMA on 12/15/2009   Method used:   Faxed to ...       [facility]order)             , [place]         Ph:        Fax: [phone]   RxID:   [id number] ATENOLOL 50 MG  TABS (ATENOLOL) 1 tab once daily  #90 x 3   Entered by:   Army Fossa CMA   Authorized by:   Nolon Rod. Paz MD   Signed by:   Army Fossa CMA on 12/15/2009   Method used:   Faxed to ...       [facility]order)             , [place]         Ph:        Fax: [phone]   RxID:   [id number] PRAVACHOL 20 MG TABS (PRAVASTATIN SODIUM) 1 by mouth qhs  #90 x 3   Entered by:   Army Fossa CMA   Authorized by:   Nolon Rod. Paz MD   Signed by:   Army Fossa CMA on 12/15/2009   Method used:   Faxed to ...       [facility]order)             , [place]         Ph:        Fax: [phone]   RxID:   [id number]

## 2010-06-02 NOTE — Assessment & Plan Note (Signed)
Summary: LUNG CONCERN//KN--Rm 5   Vital Signs:  Patient profile:   77 year old male Height:      73 inches Weight:      168.75 pounds BMI:     22.34 O2 Sat:      100 % on Room air Temp:     98.1 degrees F oral Pulse rate:   60 / minute Pulse rhythm:   regular Resp:     16 per minute BP sitting:   136 / 88  (right arm) Cuff size:   regular  Vitals Entered By: Mervin Kung CMA (October 30, 2009 9:27 AM)  O2 Flow:  Room air  CC:  Room 5  Pt states he is having pain in left side of back when he breathes x 2 days. Pt states he has history of PE.Marland Kitchen  History of Present Illness: Mr Platte is a 77 year old male who presents today with complaint of left sided pleuritic pain with inspiration which started 4 days ago.  Notes + wheezing and some shortness of breath with walking and continued pain.   Patient has history of DVT and bilateral PE in 09/2007.  Denies associated fever. Notes slight cough this AM.  Denies associated calf pain or LE swelling.  He has completed his coumadin therapy for his prior PE- last dose was 2 weeks ago.    Allergies: 1)  ! Sulfa  Past History:  Past Medical History: Last updated: 09/15/2009 MVP Prostate cancer  Hyperlipidemia DVT, hx of 2001 (after prostate surgery) PE 5-09:was referred to hematology, and they recommend to discontinue Coumadin 5/11  Past Surgical History: Last updated: 04/23/2008 Prostatectomy ( aprox. 2000) Cataract extraction  Family History: Last updated: 06/02/2009  CAD-- father have heart disease on his mid 45s DM--no prostate ca--no colon cancer--no  Social History: Last updated: 06/02/2009 Never Smoked Alcohol use-no Married no children diet-- healthy for the most part  exercise-- no  Risk Factors: Smoking Status: never (10/05/2006)  Review of Systems       see HPI  Physical Exam  General:  Well-developed,well-nourished,in no acute distress; alert,appropriate and cooperative throughout examination Head:   Normocephalic and atraumatic without obvious abnormalities. No apparent alopecia or balding. Chest Wall:  no reproducible tenderness on left lateral ribs. Lungs:  Normal respiratory effort, chest expands symmetrically. Lungs are clear to auscultation, no crackles or wheezes. Heart:  Normal rate and regular rhythm. S1 and S2 normal without gallop, murmur, click, rub or other extra sounds. Psych:  Cognition and judgment appear intact. Alert and cooperative with normal attention span and concentration. No apparent delusions, illusions, hallucinations   Impression & Recommendations:  Problem # 1:  CHEST PAIN, PLEURITIC (GMW-102.72) Assessment New CTA chest ordered and performed given patient's hx of prior PE.  CTA was negative for PE.  Suspect that his pain is musculolskeletal in nature.  Orders: TLB-BMP (Basic Metabolic Panel-BMET) (80048-METABOL) Misc. Referral (Misc. Ref)  Complete Medication List: 1)  Pravachol 20 Mg Tabs (Pravastatin sodium) .Marland Kitchen.. 1 by mouth qhs 2)  Atenolol 50 Mg Tabs (Atenolol) .Marland Kitchen.. 1 tab once daily 3)  Alprazolam 0.5 Mg Tabs (Alprazolam) .Marland Kitchen.. 1-1 1/2 by mouth at bedtime as needed insomnia 4)  Glucosamine-chondroitin Tabs (Glucosamine-chondroitin tabs) 5)  Enulose 10 Gm/42ml Soln (Lactulose encephalopathy) .... 2 tsp two times a day prn 6)  Mvi  7)  Antihistimine  .... Prn 8)  Tylenol  .... Prn 9)  Folic Acid  10)  Vicodin Es 7.5-750 Mg Tabs (Hydrocodone-acetaminophen) .Marland Kitchen.. 1 -2  q 6 hrs as needed pain 11)  Flexeril 10 Mg Tabs (Cyclobenzaprine hcl) .Marland Kitchen.. 1 by mouth at bedtime 12)  Warfarin Sodium 5 Mg Tabs (Warfarin sodium) .... As directed  Patient Instructions: 1)  Complete your lab work downstairs. 2)  We will call you with the time for CT scan this afternoon.  CC: Room 5  Pt states he is having pain in left side of back when he breathes x 2 days. Pt states he has history of PE. Is Patient Diabetic? No Comments Pt states he has completed and is off of  Coumadin.   Current Allergies (reviewed today): ! SULFA

## 2010-06-02 NOTE — Assessment & Plan Note (Signed)
Summary: calf pain/swh   Vital Signs:  Patient profile:   77 year old male Height:      73 inches Weight:      168 pounds BMI:     22.25 Temp:     99.3 degrees F BP sitting:   120 / 80  Vitals Entered By: Shary Decamp (Sep 15, 2009 8:44 AM) CC: rt calf pain x yesterday, pt has not d/c'd coumadin   History of Present Illness: here with his wife One-day history of right calf pain,? Swelling The patient denies any unusual exercise or injury No recent car or airplane trip  Current Medications (verified): 1)  Pravachol 20 Mg Tabs (Pravastatin Sodium) .Marland Kitchen.. 1 By Mouth Qhs 2)  Atenolol 50 Mg  Tabs (Atenolol) .Marland Kitchen.. 1 Tab Once Daily 3)  Alprazolam 0.5 Mg  Tabs (Alprazolam) .Marland Kitchen.. 1-1 1/2 By Mouth At Bedtime As Needed Insomnia 4)  Glucosamine-Chondroitin   Tabs (Glucosamine-Chondroitin Tabs) 5)  Enulose 10 Gm/53ml  Soln (Lactulose Encephalopathy) .... 2 Tsp Two Times A Day Prn 6)  Mvi 7)  Antihistimine .... Prn 8)  Tylenol .... Prn 9)  Folic Acid 10)  Vicodin Es 0.4-540 Mg  Tabs (Hydrocodone-Acetaminophen) .Marland Kitchen.. 1 -2 Q 6 Hrs As Needed Pain 11)  Flexeril 10 Mg Tabs (Cyclobenzaprine Hcl) .Marland Kitchen.. 1 By Mouth At Bedtime 12)  Warfarin Sodium 5 Mg Tabs (Warfarin Sodium) .... As Directed  Allergies (verified): 1)  ! Sulfa  Past History:  Past Medical History: MVP Prostate cancer  Hyperlipidemia DVT, hx of 2001 (after prostate surgery) PE 5-09:was referred to hematology, and they recommend to discontinue Coumadin 5/11  Review of Systems       denies chest pain or shortness of breath No palpitations He does admit that here lately he is getting a little more tired with yard work but again no exertional chest pain  Physical Exam  General:  alert, well-developed, and well-nourished.  alert, well-developed, and well-nourished.   Pulses:  good pedal pulses bilaterally, good capillary refill bilaterally Extremities:  no pitting edema Left calf is half inch larger than the right No  tenderness to palpation   Impression & Recommendations:  Problem # 1:  LEG PAIN, RIGHT (ICD-729.5) the patient presents with right calf pain, exam showed mild enlargement of the right calf, no tenderness. He is still on Coumadin 4 a history of DVT and PE in the past, hematology recommended to discontinue Coumadin and by the end of May 2011 Plan: Ultrasound rule out DVT, if the ultrasound is negative I think we will be able to discontinue Coumadin as planned in few days. If the ultrasound is positive for DVT, likely he will need Coumadin for life.  Problem # 2:  addendum reportedly ultrasound negative Advised patient  no DVT okay to discontinue Coumadin as planned For the pain:  recommend leg elevation  and tylenol, to let me know if not better  pt aware.Marland KitchenMarland KitchenShary Decamp  Sep 15, 2009 4:45 PM  Complete Medication List: 1)  Pravachol 20 Mg Tabs (Pravastatin sodium) .Marland Kitchen.. 1 by mouth qhs 2)  Atenolol 50 Mg Tabs (Atenolol) .Marland Kitchen.. 1 tab once daily 3)  Alprazolam 0.5 Mg Tabs (Alprazolam) .Marland Kitchen.. 1-1 1/2 by mouth at bedtime as needed insomnia 4)  Glucosamine-chondroitin Tabs (Glucosamine-chondroitin tabs) 5)  Enulose 10 Gm/62ml Soln (Lactulose encephalopathy) .... 2 tsp two times a day prn 6)  Mvi  7)  Antihistimine  .... Prn 8)  Tylenol  .... Prn 9)  Folic Acid  10)  Vicodin  Es 7.5-750 Mg Tabs (Hydrocodone-acetaminophen) .Marland Kitchen.. 1 -2 q 6 hrs as needed pain 11)  Flexeril 10 Mg Tabs (Cyclobenzaprine hcl) .Marland Kitchen.. 1 by mouth at bedtime 12)  Warfarin Sodium 5 Mg Tabs (Warfarin sodium) .... As directed  Other Orders: Protime ([id number]) Fingerstick (11914) Doppler Referral (Doppler)  Laboratory Results   Blood Tests      INR: 2.5   (Normal Range: 0.88-1.12   Therap INR: 2.0-3.5) Comments: CURRENT DOSE: 5MG  TABLETS - 1/2 by mouth once daily....Marland KitchenMarland KitchenShary Decamp  Sep 15, 2009 8:45 AM

## 2010-06-02 NOTE — Progress Notes (Signed)
Summary: discomfort left side  Phone Note Call from Patient   Caller: Patient Summary of Call: PT left VM that he has has some discomfort in his left side which he though to be a pull muscle. Pt now states that he feel the discomfort is in his lungs and request OV to see dr Drue Novel today. Tried to call pt back to get more details line busy will try again later.Marland KitchenMarland KitchenFelecia Deloach CMA  October 30, 2009 8:38 AM  pt c/o some wheezing,SOB, and pain on left back side increasing with deep breath x3days.  pt also has a slight non-productive cough. Pt denies any pressure, numbness, dizziness, or difficulty breathing. Pt has pending appt for today but advise if symptoms worsen or change in any way prior to OV he needs to be seen in ED immediately, pt ok..............Marland KitchenFelecia Deloach CMA  October 30, 2009 8:59 AM

## 2010-06-02 NOTE — Letter (Signed)
Summary: Letter from Patient with Concerns  Letter from Patient with Concerns   Imported By: Lanelle Bal 10/04/2009 10:58:45  _____________________________________________________________________  External Attachment:    Type:   Image     Comment:   External Document  Appended Document: Letter from Patient with Concerns advised patient our records are different from the hospital records. We know he has no hypertension we knew he had PE in 2009  We knew also that he had a DVT in 2001 after surgery We will discuss this in more detail when he comes back  Appended Document: Letter from Patient with Concerns DISCUSS WITH PATIENT

## 2010-06-02 NOTE — Assessment & Plan Note (Signed)
Summary: back pain/kn   Vital Signs:  Patient profile:   77 year old male Weight:      169.13 pounds Pulse rate:   71 / minute Pulse rhythm:   regular BP sitting:   128 / 88  (left arm) Cuff size:   regular  Vitals Entered By: Army Fossa CMA (March 13, 2010 9:33 AM) CC: Pt here c/o ongoing back pain. Comments Worse over past 10 days. [facility] [address]    History of Present Illness: continue with back pain since the last visit 8-11 pain he is now mostly in the mid  lower back and sometimes in the right lower back From time to time it radiates to the right leg, lateral aspect of the tight. Pain does not go beyond the knee. He is taking Tylenol and ibuprofen several times a day pain increased by walking Pain decrease by lying down on the left side and bending the knees.  Review of systems No fever No bladder or bowel incontinence No rash No abdominal pain No gross hematuria  Current Medications (verified): 1)  Pravachol 20 Mg Tabs (Pravastatin Sodium) .Marland Kitchen.. 1 By Mouth Qhs 2)  Atenolol 50 Mg  Tabs (Atenolol) .Marland Kitchen.. 1 Tab Once Daily 3)  Alprazolam 0.5 Mg  Tabs (Alprazolam) .Marland Kitchen.. 1-1 1/2 By Mouth At Bedtime As Needed Insomnia 4)  Glucosamine-Chondroitin   Tabs (Glucosamine-Chondroitin Tabs) 5)  Enulose 10 Gm/63ml  Soln (Lactulose Encephalopathy) .... 2 Tsp Two Times A Day Prn 6)  Mvi 7)  Antihistimine .... Prn 8)  Tylenol .... Prn 9)  Folic Acid  Allergies (verified): 1)  ! Sulfa  Past History:  Past Medical History: Reviewed history from 09/15/2009 and no changes required. MVP Prostate cancer  Hyperlipidemia DVT, hx of 2001 (after prostate surgery) PE 5-09:was referred to hematology, and they recommend to discontinue Coumadin 5/11  Past Surgical History: Reviewed history from 04/23/2008 and no changes required. Prostatectomy ( aprox. 2000) Cataract extraction  Social History: Reviewed history from 12/15/2009 and no changes required. Never Smoked Alcohol  use-no Married, wife w/ melanoma, advanced  no children diet-- healthy for the most part  exercise-- no  Physical Exam  General:  alert and well-developed.   Abdomen:  soft, non-tender, no distention, no masses, no guarding, and no rigidity.   Msk:  no tender to palpation of the lower back Extremities:  no edema Neurologic:  posture and gait is antalgic Lower extremity motor strength symmetric Knee jerk symmetric Decrease right ankle jerk   Impression & Recommendations:  Problem # 1:  BACK PAIN (ICD-724.5) back pain for months with some radicular features History prostate cancer Plan: MRI orthopedic surgical referral Pain control with Vicodin, Mobic. Discontinue OTCs  The following medications were removed from the medication list:    Flexeril 10 Mg Tabs (Cyclobenzaprine hcl) .Marland Kitchen... 1 by mouth at bedtime His updated medication list for this problem includes:    Vicodin 5-500 Mg Tabs (Hydrocodone-acetaminophen) ..... One or 2 every 6 hours as needed for pain    Meloxicam 15 Mg Tabs (Meloxicam) ..... One by mouth daily, take with food  Orders: Radiology Referral (Radiology) Orthopedic Referral (Ortho)  Complete Medication List: 1)  Pravachol 20 Mg Tabs (Pravastatin sodium) .Marland Kitchen.. 1 by mouth qhs 2)  Atenolol 50 Mg Tabs (Atenolol) .Marland Kitchen.. 1 tab once daily 3)  Alprazolam 0.5 Mg Tabs (Alprazolam) .Marland Kitchen.. 1-1 1/2 by mouth at bedtime as needed insomnia 4)  Glucosamine-chondroitin Tabs (Glucosamine-chondroitin tabs) 5)  Enulose 10 Gm/69ml Soln (Lactulose encephalopathy) .Marland KitchenMarland KitchenMarland Kitchen  2 tsp two times a day prn 6)  Mvi  7)  Antihistimine  .... Prn 8)  Folic Acid  9)  Vicodin 5-500 Mg Tabs (Hydrocodone-acetaminophen) .... One or 2 every 6 hours as needed for pain 10)  Meloxicam 15 Mg Tabs (Meloxicam) .... One by mouth daily, take with food  Patient Instructions: 1)  discontinue over-the-counter pain medication 2)  Take medicines as prescribed 3)  watch for side effects, Vicodin will make her  drowsy, Mobic may irritate your stomach 4)  heating pad Prescriptions: MELOXICAM 15 MG TABS (MELOXICAM) one by mouth daily, take with food  #30 x 1   Entered and Authorized by:   Elita Quick E. Calbert Hulsebus MD   Signed by:   Nolon Rod. Onia Shiflett MD on 03/13/2010   Method used:   Print then Give to Patient   RxID:   [id number] VICODIN 5-500 MG TABS (HYDROCODONE-ACETAMINOPHEN) one or 2 every 6 hours as needed for pain  #60 x 0   Entered and Authorized by:   Nolon Rod. Dariel Betzer MD   Signed by:   Nolon Rod. Ashlay Altieri MD on 03/13/2010   Method used:   Print then Give to Patient   RxID:   [id number]    Orders Added: 1)  Est. Patient Level IV [02725] 2)  Radiology Referral [Radiology] 3)  Orthopedic Referral [Ortho]

## 2010-06-04 NOTE — Assessment & Plan Note (Signed)
Summary: 4 MONTH//OV//PH   Vital Signs:  Patient profile:   77 year old male Weight:      169.50 pounds Pulse rate:   67 / minute Pulse rhythm:   regular BP sitting:   126 / 84  (left arm) Cuff size:   regular  Vitals Entered By: Army Fossa CMA (April 14, 2010 9:24 AM) CC: 4 month f/u- fasting  Comments Discuss Vicodin & Meloxicam [facility] [address]    History of Present Illness: followup on back pain, it  is 90% better. meloxicam helps the pain, taking it as needed. He took some Vicodin but has several  leftover pills   Current Medications (verified): 1)  Pravachol 20 Mg Tabs (Pravastatin Sodium) .Marland Kitchen.. 1 By Mouth Qhs 2)  Atenolol 50 Mg  Tabs (Atenolol) .Marland Kitchen.. 1 Tab Once Daily 3)  Glucosamine-Chondroitin   Tabs (Glucosamine-Chondroitin Tabs) 4)  Enulose 10 Gm/78ml  Soln (Lactulose Encephalopathy) .... 2 Tsp Two Times A Day Prn 5)  Mvi 6)  Antihistimine .... Prn 7)  Folic Acid 8)  Vicodin 5-500 Mg Tabs (Hydrocodone-Acetaminophen) .... One or 2 Every 6 Hours As Needed For Pain 9)  Meloxicam 15 Mg Tabs (Meloxicam) .... One By Mouth Daily, Take With Food  Allergies (verified): 1)  ! Sulfa  Past History:  Past Medical History: MVP Prostate cancer  Hyperlipidemia DVT, hx of 2001 (after prostate surgery) PE 5-09:was referred to hematology, and they recommend to discontinue Coumadin 5/11  Past Surgical History: Reviewed history from 04/23/2008 and no changes required. Prostatectomy ( aprox. 2000) Cataract extraction  Social History: Reviewed history from 12/15/2009 and no changes required. Never Smoked Alcohol use-no Married, wife w/ melanoma, advanced  no children diet-- healthy for the most part  exercise-- no  Review of Systems CV:  Denies chest pain or discomfort and swelling of feet. Resp:  Denies cough and shortness of breath. GI:  Denies abdominal pain; no hearburn.  Physical Exam  General:  alert and well-developed.   Lungs:  Normal respiratory  effort, chest expands symmetrically. Lungs are clear to auscultation, no crackles or wheezes. Heart:  Normal rate and regular rhythm. S1 and S2 normal without gallop, murmur, click, rub or other extra sounds. Psych:  Cognition and judgment appear intact. Alert and cooperative with normal attention span and concentration. No apparent delusions, illusions, hallucinations   Impression & Recommendations:  Problem # 1:  BACK PAIN (ICD-724.5) much improved Plan: Meloxicam as needed, Vicodin as needed as well.... if meloxicam does  not help  enough  His updated medication list for this problem includes:    Vicodin 5-500 Mg Tabs (Hydrocodone-acetaminophen) ..... One or 2 every 6 hours as needed for pain    Meloxicam 15 Mg Tabs (Meloxicam) ..... One by mouth daily, take with food  Problem # 2:  DEGENERATIVE JOINT DISEASE (ICD-715.90) see #1 Meloxicam and Vicodin as needed His updated medication list for this problem includes:    Vicodin 5-500 Mg Tabs (Hydrocodone-acetaminophen) ..... One or 2 every 6 hours as needed for pain    Meloxicam 15 Mg Tabs (Meloxicam) ..... One by mouth daily, take with food  Problem # 3:  HYPERLIPIDEMIA (ICD-272.4) no change His updated medication list for this problem includes:    Pravachol 20 Mg Tabs (Pravastatin sodium) .Marland Kitchen... 1 by mouth qhs  Labs Reviewed: SGOT: 30 (06/05/2009)   SGPT: 33 (06/05/2009)   HDL:38.90 (06/05/2009), 33.8 (04/29/2008)  LDL:86 (06/05/2009), DEL (81/19/1478)  Chol:160 (06/05/2009), 204 (04/29/2008)  Trig:175.0 (06/05/2009), 202 (04/29/2008)  Complete  Medication List: 1)  Pravachol 20 Mg Tabs (Pravastatin sodium) .Marland Kitchen.. 1 by mouth qhs 2)  Atenolol 50 Mg Tabs (Atenolol) .Marland Kitchen.. 1 tab once daily 3)  Glucosamine-chondroitin Tabs (Glucosamine-chondroitin tabs) 4)  Enulose 10 Gm/71ml Soln (Lactulose encephalopathy) .... 2 tsp two times a day prn 5)  Mvi  6)  Antihistimine  .... Prn 7)  Folic Acid  8)  Vicodin 5-500 Mg Tabs  (Hydrocodone-acetaminophen) .... One or 2 every 6 hours as needed for pain 9)  Meloxicam 15 Mg Tabs (Meloxicam) .... One by mouth daily, take with food  Patient Instructions: 1)  Please schedule a follow-up appointment in 2 months, fasting, physical exam Prescriptions: MELOXICAM 15 MG TABS (MELOXICAM) one by mouth daily, take with food  #90 x 1   Entered and Authorized by:   Elita Quick E. Paz MD   Signed by:   Nolon Rod. Paz MD on 04/14/2010   Method used:   Print then Give to Patient   RxID:   [id number]    Orders Added: 1)  Est. Patient Level III [14782]

## 2010-06-04 NOTE — Progress Notes (Signed)
Summary: flexeril refill?  Phone Note Call from Patient Call back at Work Phone [phone]   Caller: Patient Summary of Call: Pt called and states that he has been taking Flexeril ongoing for back pain. His medication has expired so he would like a new rx sent in to [facility] [address]. Is this possible? He has an appt in 2 weeks to see you.  Initial call taken by: Army Fossa CMA,  May 28, 2010 12:46 PM  Follow-up for Phone Call        Flexeril 10 mg one p.o. b.i.d. p.r.n. pain, 60, 3 refills Follow-up by: Nolon Rod. Antero Derosia MD,  May 28, 2010 1:40 PM    New/Updated Medications: FLEXERIL 10 MG TABS (CYCLOBENZAPRINE HCL) 1 by mouth two times a day as needed for pain. Prescriptions: FLEXERIL 10 MG TABS (CYCLOBENZAPRINE HCL) 1 by mouth two times a day as needed for pain.  #60 x 3   Entered by:   Army Fossa CMA   Authorized by:   Nolon Rod. Quyen Cutsforth MD   Signed by:   Army Fossa CMA on 05/28/2010   Method used:   Electronically to        [facility] [address] (retail)       [phone]       [place], [place]  [address]       Ph: [phone]       Fax: [phone]   RxID:   [id number]

## 2010-06-09 ENCOUNTER — Encounter (INDEPENDENT_AMBULATORY_CARE_PROVIDER_SITE_OTHER): Payer: Medicare Other | Admitting: Internal Medicine

## 2010-06-09 ENCOUNTER — Other Ambulatory Visit: Payer: Self-pay | Admitting: Internal Medicine

## 2010-06-09 ENCOUNTER — Encounter: Payer: Self-pay | Admitting: Internal Medicine

## 2010-06-09 DIAGNOSIS — M1990 Unspecified osteoarthritis, unspecified site: Secondary | ICD-10-CM

## 2010-06-09 DIAGNOSIS — E785 Hyperlipidemia, unspecified: Secondary | ICD-10-CM

## 2010-06-09 DIAGNOSIS — Z8546 Personal history of malignant neoplasm of prostate: Secondary | ICD-10-CM

## 2010-06-09 DIAGNOSIS — Z0000 Encounter for general adult medical examination without abnormal findings: Secondary | ICD-10-CM

## 2010-06-09 DIAGNOSIS — Z130 Encounter for screening for diseases of the blood and blood-forming organs and certain disorders involving the immune mechanism: Secondary | ICD-10-CM

## 2010-06-09 DIAGNOSIS — H6980 Other specified disorders of Eustachian tube, unspecified ear: Secondary | ICD-10-CM

## 2010-06-09 LAB — CBC WITH DIFFERENTIAL/PLATELET
Basophils Absolute: 0 10*3/uL (ref 0.0–0.1)
Eosinophils Absolute: 0.2 10*3/uL (ref 0.0–0.7)
HCT: 45.2 % (ref 39.0–52.0)
Hemoglobin: 15.9 g/dL (ref 13.0–17.0)
Lymphs Abs: 1 10*3/uL (ref 0.7–4.0)
MCHC: 35.1 g/dL (ref 30.0–36.0)
Neutro Abs: 4 10*3/uL (ref 1.4–7.7)
Platelets: 166 10*3/uL (ref 150.0–400.0)
RDW: 12.2 % (ref 11.5–14.6)

## 2010-06-09 LAB — ALT: ALT: 35 U/L (ref 0–53)

## 2010-06-09 LAB — LIPID PANEL
HDL: 35.5 mg/dL — ABNORMAL LOW (ref >39.00)
Total CHOL/HDL Ratio: 4
VLDL: 39.8 mg/dL (ref 0.0–40.0)

## 2010-06-18 NOTE — Assessment & Plan Note (Signed)
Summary: CPX AND LABS/SPH/PH   Vital Signs:  Patient profile:   77 year old male Height:      73 inches Weight:      170.38 pounds BMI:     22.56 Pulse rate:   80 / minute Pulse rhythm:   regular BP sitting:   132 / 82  (left arm) Cuff size:   regular  Vitals Entered By: Army Fossa CMA (June 09, 2010 9:52 AM) CC: CPX, fasting  Comments feel like has water in ears Discuss ASA dosge  [employer] [facility] [employer]/[facility]    History of Present Illness: Here for Medicare AWV:  1.   Risk factors based on Past M, S, F history: reviewed  2.   Physical Activities: active at home, trying to take walks but is busy taking care of wife  3.   Depression/mood: wife w/ cancer , sometimes get anxious but is managing ok 4.   Hearing: some difficult on the L, see below  5.   ADL's: independent  6.   Fall Risk: no recent falls , prevention discussed  7.   Home Safety: does feel safe at home  8.   Height, weight, &visual acuity: see VS, sees eye doctor routinely  9.   Counseling: yes  10.   Labs ordered based on risk factors: yes  11.           Referral Coordination, if needed  12.           Care Plan, see a/p  13.            Cognitive Assessment : cognition, memory and motor skill seem appropiate   in addition, we discussed the following too much ASA?-- no L ear feels like it has water x last week, on-off . No d/c or pain, hearing slightly  decreased . no recent URI symptoms  low back pain-- still there on-off, pain relatively well controlled w/ vicodin-flexeril-meloxicam  Prostate cancer-- was released by urology, 11 years post surgery    Hyperlipidemia-- good medication compliance     Preventive Screening-Counseling & Management  Caffeine-Diet-Exercise     Does Patient Exercise: yes  Current Medications (verified): 1)  Pravachol 20 Mg Tabs (Pravastatin Sodium) .Marland Kitchen.. 1 By Mouth Qhs 2)  Atenolol 50 Mg  Tabs (Atenolol) .Marland Kitchen.. 1 Tab Once Daily 3)  Glucosamine-Chondroitin    Tabs (Glucosamine-Chondroitin Tabs) 4)  Enulose 10 Gm/69ml  Soln (Lactulose Encephalopathy) .... 2 Tsp Two Times A Day Prn 5)  Mvi 6)  Antihistimine .... Prn 7)  Folic Acid 8)  Vicodin 5-500 Mg Tabs (Hydrocodone-Acetaminophen) .... One or 2 Every 6 Hours As Needed For Pain 9)  Meloxicam 15 Mg Tabs (Meloxicam) .... One By Mouth Daily, Take With Food 10)  Flexeril 10 Mg Tabs (Cyclobenzaprine Hcl) .Marland Kitchen.. 1 By Mouth Two Times A Day As Needed For Pain. 11)  Aspirtab 324 Mg Tbec (Aspirin) 12)  Miralax  Powd (Polyethylene Glycol 3350) .... As Needed 13)  Tylenol Pm Extra Strength 500-25 Mg Tabs (Diphenhydramine-Apap (Sleep)) .... As Needed  Allergies (verified): 1)  ! Sulfa  Past History:  Past Medical History: MVP Prostate cancer , released from urology in 2010,needs yearly PSAs with PCP Hyperlipidemia DVT, hx of 2001 (after prostate surgery) PE 5-09:was referred to hematology, and they recommend to discontinue Coumadin 5/11  Past Surgical History: Reviewed history from 04/23/2008 and no changes required. Prostatectomy ( aprox. 2000) Cataract extraction  Family History: Reviewed history from 06/02/2009 and no changes required.  CAD-- father have heart disease on his mid 36s DM--no prostate ca--no colon cancer--no  Social History: Never Smoked Alcohol use-no Married, wife w/ melanoma, advanced  no children diet-- healthy for the most part  exercise-- trying to walk most days Does Patient Exercise:  yes  Review of Systems CV:  Denies chest pain or discomfort and swelling of feet. Resp:  Denies cough and shortness of breath. GI:  Denies bloody stools, diarrhea, nausea, and vomiting. GU:  Denies hematuria, urinary frequency, and urinary hesitancy. MS:  (-) LE paresthesias  .  Physical Exam  General:  alert, well-developed, and well-nourished.   Ears:  R ear normal and L ear normal.  (L TM slightly retracted?) Neck:  no masses and no thyromegaly.   Lungs:  Normal  respiratory effort, chest expands symmetrically. Lungs are clear to auscultation, no crackles or wheezes. Heart:  normal rate, regular rhythm, and no murmur.   Abdomen:  soft, non-tender, no distention, no masses, no guarding, and no rigidity.   Extremities:  no edema, calves  symmetric, nontender Neurologic:  alert & oriented X3, strength normal in all extremities, and gait normal.   Psych:  Oriented X3, memory intact for recent and remote, normally interactive, good eye contact, not anxious appearing, and not depressed appearing.     Impression & Recommendations:  Problem # 1:  HEALTH SCREENING (ICD-V70.0)  TD 2002 pneumonia shot  got his second shot in 2006 shingles shot: had already had a  flu shot already   cscope 01-2005, had tics, next 2016  status post prostatectomy for prostate cancer around 2000, discharge from urology  2010, Rx  yearly PSAs ---checking today  counseled: continue trying to exercise daily, continue with his healthy diet  Orders: Medicare -1st Annual Wellness Visit [phone])  Problem # 2:  DEGENERATIVE JOINT DISEASE (ICD-715.90) continue with   right-sided back pain from time to time; we agreed to continue with present medications and he is to think about see DR Ramos, local injection?   His updated medication list for this problem includes:    Vicodin 5-500 Mg Tabs (Hydrocodone-acetaminophen) ..... One or 2 every 6 hours as needed for pain    Meloxicam 15 Mg Tabs (Meloxicam) ..... One by mouth daily, take with food    Aspirtab 324 Mg Tbec (Aspirin)  Problem # 3:  HYPERLIPIDEMIA (ICD-272.4) good medication compliance  His updated medication list for this problem includes:    Pravachol 20 Mg Tabs (Pravastatin sodium) .Marland Kitchen... 1 by mouth qhs  Labs Reviewed: SGOT: 30 (06/05/2009)   SGPT: 33 (06/05/2009)   HDL:38.90 (06/05/2009), 33.8 (04/29/2008)  LDL:86 (06/05/2009), DEL (98/03/9146)  Chol:160 (06/05/2009), 204 (04/29/2008)  Trig:175.0 (06/05/2009), 202  (04/29/2008)  Orders: TLB-ALT (SGPT) (84460-ALT) TLB-AST (SGOT) (84450-SGOT) TLB-Lipid Panel (80061-LIPID) Specimen Handling (82956)  Problem # 4:  ? of EUSTACHIAN TUBE DYSFUNCTION (ICD-381.81) left ear feels like it has water on it  Exam unremarkable, TM slightly retracted? recommendation to wait for a few days, if symptoms persist will need a ENT evaluation  Complete Medication List: 1)  Pravachol 20 Mg Tabs (Pravastatin sodium) .Marland Kitchen.. 1 by mouth qhs 2)  Atenolol 50 Mg Tabs (Atenolol) .Marland Kitchen.. 1 tab once daily 3)  Enulose 10 Gm/15ml Soln (Lactulose encephalopathy) .... 2 tsp two times a day prn 4)  Miralax Powd (Polyethylene glycol 3350) .... As needed 5)  Vicodin 5-500 Mg Tabs (Hydrocodone-acetaminophen) .... One or 2 every 6 hours as needed for pain 6)  Meloxicam 15 Mg Tabs (Meloxicam) .... One by  mouth daily, take with food 7)  Flexeril 10 Mg Tabs (Cyclobenzaprine hcl) .Marland Kitchen.. 1 by mouth two times a day as needed for pain. 8)  Aspirtab 324 Mg Tbec (Aspirin) 9)  Glucosamine-chondroitin Tabs (Glucosamine-chondroitin tabs) 10)  Tylenol Pm Extra Strength 500-25 Mg Tabs (Diphenhydramine-apap (sleep)) .... As needed 11)  Mvi  12)  Antihistimine  .... Prn 13)  Folic Acid   Other Orders: Venipuncture (95638) TLB-PSA (Prostate Specific Antigen) (84153-PSA) TLB-CBC Platelet - w/Differential (85025-CBCD)  Patient Instructions: 1)  Please schedule a follow-up appointment in 6 months .  Prescriptions: VICODIN 5-500 MG TABS (HYDROCODONE-ACETAMINOPHEN) one or 2 every 6 hours as needed for pain  #60 x 0   Entered and Authorized by:   Nolon Rod. Kanyla Omeara MD   Signed by:   Nolon Rod. Saksham Akkerman MD on 06/09/2010   Method used:   Print then Give to Patient   RxID:   [id number]    Orders Added: 1)  Venipuncture [06301] 2)  TLB-PSA (Prostate Specific Antigen) [60109-NAT] 3)  TLB-CBC Platelet - w/Differential [85025-CBCD] 4)  TLB-ALT (SGPT) [84460-ALT] 5)  TLB-AST (SGOT) [84450-SGOT] 6)  TLB-Lipid Panel  [80061-LIPID] 7)  Specimen Handling [99000] 8)  Est. Patient Level III [55732] 9)  Medicare -1st Annual Wellness Visit [G0438]   Immunization History:  Influenza Immunization History:    Influenza:  historical (01/01/2010)   Immunization History:  Influenza Immunization History:    Influenza:  Historical (01/01/2010)   Risk Factors:  Exercise:  yes    ANTICOAGULATION RECORD PREVIOUS REGIMEN & LAB RESULTS Anticoagulation Diagnosis:  PE on  03/14/2009 Previous INR Goal Range:  2-3 on  03/14/2009 Previous INR:  2.5 on  09/15/2009 Previous Coumadin Dose(mg):  1/2 TAB QD on  04/23/2008 Previous Regimen:  2 tab Fridays and 1 tab all other days on  03/14/2009  NEW REGIMEN & LAB RESULTS Regimen: 2 tab Fridays and 1 tab all other days  (no change)

## 2010-09-11 ENCOUNTER — Other Ambulatory Visit: Payer: Self-pay | Admitting: *Deleted

## 2010-09-11 MED ORDER — LACTULOSE 10 GM/15ML PO SOLN: ORAL | Status: DC

## 2010-09-15 NOTE — Discharge Summary (Signed)
NAMEJORDIN, Brandon Burke NO.:  [id number]   MEDICAL RECORD NO.:  [id number]          PATIENT TYPE:  INP   LOCATION:  3712                         FACILITY:  [facility]   PHYSICIAN:  Titus Dubin. Hopper, MD,FACP,FCCPDATE OF BIRTH:  1933-09-30   DATE OF ADMISSION:  09/14/2007  DATE OF DISCHARGE:  09/16/2007                               DISCHARGE SUMMARY   ADMITTING DIAGNOSES:  1. Bilateral pulmonary emboli with pulmonary infarctions.  2. Past medical history of deep venous thrombosis.  3. History of prostate cancer.   BRIEF HISTORY:  Mr. Huelsmann presented acutely on Sep 13, 2007, with  right-sided chest pain, which was pleuritic in nature.  NSAIDs had  resulted in some improvement, as has holding his breath and change in  position.  He was seen in the emergency room on Sep 07, 2007, for  tendonitis of the right lower extremity.  He did have a past medical  history of deep venous thrombosis in 2001.  At the emergency room  visit, venous Dopplers were negative for deep venous thrombosis.   PHYSICAL EXAMINATION:  GENERAL:  On exam, he was afebrile.  VITAL SIGNS:  Normal despite his pain.  He was splinting on the right  without rub.  Homans sign was negative bilaterally.   He was sent for chest x-ray and CT angiogram on a stat basis.  This  revealed bilateral emboli with infarctions, greater on the right than  the left.   He was hospitalized for anticoagulation with heparin and Coumadin as per  pharmacy.  His narcotic pain medication was continued.   He stabilized and had no complications during the hospitalization.   On the day of discharge, he was essentially pain free.  He stated that  Sep 16, 2007 was the first day that he did  not have the pleuritic-type  pain.   He was afebrile.  It is to be noted that his temperature had been as  high as 101.2 on Sep 14, 2007, but he was otherwise afebrile during the  hospitalization.  He denied any shortness of breath.  He was  afebrile  with a regular rate of 75 with respiratory rate of 18.  Blood pressure  was 123/68, and O2 sats were 96% on room air.  On exam, he had decreased  breath sounds at the right base without whispered pectoriloquy or rub.  Homans sign was negative.  He had no edema.  Repeat venous Doppler in  the hospital had been negative as well.   White count was 6,600 with a hematocrit of 41.  INR at the time of  discharge was 2.6.   His coagulopathy profile was basically normal.  His antithrombin III was  121 with normals 75-120.  His dRVVT was 48.5 with normals of 36-47.  The  remainder of the coagulopathy profile was negative.  He was given a copy  of this for his home file.   The patient felt comfortable administering his Lovenox and was willing  to go home with Lovenox therapy.  He is to be noted that this  necessitated a copay of approximately $400.   He  was discharged on Lovenox 110 mg daily for 13 days.  Coumadin was  being continued at 5 mg daily with a followup prothrombin time May 18,  at Dr. Leta Jungling office.   He was to continue the Vicodin 1-2 every 6 hours as needed for pain.  There was no change in his atenolol 50 mg daily, folic acid 100 mg  daily, lactulose 10 mg daily and, Niaspan 1000 mg daily.   He was asked to practice modified incentive spirometry by blowing up by  10 balloons a day.   The past medical history was again reviewed.  The initial deep venous  thrombosis had occurred following surgery for prostate cancer, but  albeit several months thereafter.  He did have plantar fascitis at that  time and was somewhat limited in his mobilization.   At this time, there is no predisposition for deep venous thrombosis or  pulmonary and thromboemboli and lifelong anticoagulation is assumed to  be the appropriate course.   I have recommended that he  see Dr. Genene Churn. Granfortuna to review the  coagulopathy data and history in reference to lifelong anticoagulation  in view  of the absence of any triggers or  predisposition.  These  recommendations are based on the review of anticoagulation duration post  event  in [facility] within the last month.   His discharge status improved.   Prognosis appears to be good.   DIET AND ACTIVITY:  As tolerated.   He is to return to the emergency room should he have recurrence of chest  pain, hemoptysis, or other warning signs as discussed.      Titus Dubin. Alwyn Ren, MD,FACP,FCCP  Electronically Signed     [facility]/MEDQ  D:  09/16/2007  T:  09/16/2007  Job:  [id number]

## 2010-09-15 NOTE — Discharge Summary (Signed)
Brandon Burke, Brandon Burke           ACCOUNT NO.:  [id number]   MEDICAL RECORD NO.:  [id number]         PATIENT TYPE:  CINP   LOCATION:                               FACILITY:  [facility]   PHYSICIAN:  Brandon Burke, MDDATE OF BIRTH:  [birth year]   DATE OF ADMISSION:  10/30/2007  DATE OF DISCHARGE:  10/30/2007                               DISCHARGE SUMMARY   PRIMARY CARE PHYSICIAN:  Brandon Ora, MD   DISCHARGE DIAGNOSES:  Recent deep vein thrombosis with pulmonary  embolism.   HISTORY OF PRESENT ILLNESS:  Brandon Burke is a very pleasant 77-year-  old white male recently discharged from [facility] on Sep 16, 2007, with continued treatment for bilateral pulmonary embolism with  pulmonary infarctions on continued Lovenox and Coumadin therapy.  The  patient presented to [facility] Emergency Room on morning of admission  with worsening right lower extremity pain without any associated  shortness of breath or chest pain.  INR obtained at the time of  admission 2.0.  The patient concerned of recurrent or worsening DVT,  therefore, presented to the emergency room for further evaluation.  Due  to the patient's history, as stated above the patient was admitted for  further evaluation and treatment.   PAST MEDICAL HISTORY:  1. Recent DVT with bilateral PE.  2. Hypertension.  3. Hyperlipidemia.  4. Mitral valve prolapse.  5. Prostate cancer.   COURSE OF HOSPITALIZATION:  1. Increasing right lower extremity pain in the setting of known      bilateral pulmonary embolisms with history of DVT.  The patient was      admitted for emergency room and placed on heparin drip.  The      patient did report difficulty managing his INRs as it has been as      high as 8.  As mentioned above, the patient denied any worsening      shortness of breath or chest pain actually having improved since      discharge on Sep 16, 2007.  Venous Dopplers of the lower      extremities were obtained during  this admission, which showed no      evidence of DVT and the patient's leg pain has resolved shortly      after time of admission.  After discussion with the patient's      primary care physician, I have decided that the patient will be      followed by the Coumadin Clinic for management of his INR while on      Coumadin.  The patient is agreeable to this plan.   MEDICATIONS:  At the time of discharge:  1. Coumadin 2.5 mg a.m. daily unless otherwise instructed.  2. Atenolol 50 mg p.o. daily.  3. Vicodin 5/325 one to two tablets p.o. q.4 h. p.r.n. pain.  4. Folic acid 1 mg p.o. daily.  5. Lactulose 10 mg p.o. daily.  6. Xanax 0.5 mg p.o. nightly as needed.  7. Glucosamine chondroitin 1 tablet daily.   Pertinent lab work during this hospitalization:  White cell count 4.6,  platelet count 140, hemoglobin  14.2, hematocrit 40.6.  Sodium 139,  potassium 5.2, BUN 17, creatinine 1.01.  D-dimer less than 0.22.  INR at  the time of discharge 2.0.   DISPOSITION:  The patient felt medically stable for discharge home with  continued anticoagulation therapy.  The patient is scheduled to follow  up with the Coumadin Clinic on November 01, 2007, at 12:15 p.m.  In addition,  the patient instructed to call primary care physician, Dr. Willow Burke, for  appointment in approximately 1 to 2 months.      Cordelia Pen, NP      Raenette Rover. Felicity Coyer, MD  Electronically Signed    LE/MEDQ  D:  12/04/2007  T:  12/05/2007  Job:  [id number]   cc:   Brandon Ora, MD

## 2010-09-15 NOTE — H&P (Signed)
NAMEERRIC, MACHNIK NO.:  [id number]   MEDICAL RECORD NO.:  [id number]          PATIENT TYPE:  INP   LOCATION:  3113                         FACILITY:  [facility]   PHYSICIAN:  Maude Leriche, MD      DATE OF BIRTH:  [birth year]   DATE OF ADMISSION:  10/30/2007  DATE OF DISCHARGE:                              HISTORY & PHYSICAL   PRIMARY CARE PHYSICIAN:  Dr. Willow Ora.   CHIEF COMPLAINT:  Leg pain.   HISTORY OF PRESENT ILLNESS:  Mr. Bracco is a 77 year old white male  with history of right lower extremity DVT and pulmonary embolism  resultant there of and has been under treatment for the past 4 weeks  after a prolonged hospital course, who presents with worsening right  lower extremity pain.  He reports this is the same pain that he had at  the presentation of DVT, and his primary care physician told him to  present to the ED if he had recurrence in his pain.  He denies shortness  of breath or chest tightness.  He does report some sharp pleuritic chest  pain that is consistent with his prior PE and has been getting better.  He denies swelling or focal pain in his leg that is palpable, but he  just has generalized pain in his right calf.  He reports he has been  having difficulty with his Coumadin and anticoagulation treatment.   PAST MEDICAL HISTORY:  1. Recent DVT with PE.  2. Hypertension.  3. Hyperlipidemia.  4. Mitral valve prolapse.  5. Prostate cancer.   FAMILY HISTORY:  Significant for hypertension.   SOCIAL HISTORY:  No drugs of abuse, nondrinker, nonsmoker.  [place]   ALLERGIES:  Sulfa.   MEDICATIONS:  1. Multivitamin.  2. Lactulose.  3. Glucosamine chondroitan.  4. Folic acid.  5. Atenolol 50 mg once daily.  6. Coumadin 2.5 mg daily.  7. Vicodin as needed for pain.  8. Xanax 0.5 mg as needed.   REVIEW OF SYSTEMS:  As per history of present illness, also a 14-point  review of systems was obtained  and negative.   PHYSICAL EXAMINATION:  VITAL SIGNS:  Temperature 97.6 degrees  Fahrenheit, pulse 63, respirations 18, blood pressure 130/68.  GENERAL:  Healthy-appearing white male in no acute distress.  HEENT: Oropharynx clear.  Extraocular movements intact.  NECK:  Supple without jugular venous distention.  CHEST:  Clear to auscultation bilaterally without wheezes, rales or  rhonchi.  HEART:  Regular rate and rhythm without murmur or gallop.  ABDOMEN:  Soft, nontender, positive bowel sounds.  EXTREMITIES:  No palpable cords, tenderness to palpation, edema or  erythema of either lower extremity.  SKIN: No gross skin lesions.  NEUROLOGIC:  No focal weakness.   LABORATORY DATA:  CBC within normal limits.  INR 2.0, D-dimer less than  0.23.   IMPRESSION:  Patient with DVT and PE on active therapy with new pain in  right lower extremity and low end INR.  Concern for insufficient  anticoagulation and extension of DVT.  Plan to admit for heparinization  and increased Coumadin dose.  Placed on telemetry for monitoring and  will check a Doppler of lower extremities in the morning to evaluate for  progression of DVT.      Maude Leriche, MD  Electronically Signed     EWR/MEDQ  D:  10/30/2007  T:  10/30/2007  Job:  [id number]

## 2010-09-18 NOTE — Discharge Summary (Signed)
NAME:  Brandon Burke, Brandon Burke NO.:  [id number]   MEDICAL RECORD NO.:  [id number]                   PATIENT TYPE:  OBV   LOCATION:  0374                                 FACILITY:  [facility]   PHYSICIAN:  Rene Paci, M.D. [facility]          DATE OF BIRTH:  26-Apr-1932   DATE OF ADMISSION:  11/21/2002  DATE OF DISCHARGE:  11/22/2002                                 DISCHARGE SUMMARY   DISCHARGE DIAGNOSES:  1. Chest pain, rule out myocardial infarction, negative.  Etiology unclear.  2. History of mitral valve prolapse.  3. Elevated blood pressure.   DISCHARGE MEDICATIONS:  1. Atenolol 50 mg p.o. daily (as prior to admission).  2. Baby aspirin 81 mg p.o. daily.  3. Over-the-counter pain relief such as Aleve, Tylenol, or ibuprofen as     needed.   DISPOSITION:  Home.   FOLLOWUP:  Tomorrow a.m., July 23 at 8:30 with an exercise Cardiolite at  [facility] to further risk stratify, then with Wanda Plump, MD [facility],  patient's chosen primary care physician with whom he has not yet met.   HOSPITAL COURSE:  CHEST PAIN, RULE OUT MYOCARDIAL INFARCTION:  The patient  is a pleasant, healthy 77 year old thin white gentleman with no significant  past medical history except for mitral valve prolapse for which he has been  treated with atenolol.  Per his report brought to the hospital day of  admission when he experienced a sudden onset of sharp pressure like  sensation substernally and beneath his left breast.  This sensation lasted  at a significant value of time, approximately 15-20 minutes and then waned  on its own without intervention.  Pain was non-exertional, did not radiate  to arm or jaw.  No dyspnea on exertion.  No previous chest pain.  No history  of previous MI or coronary disease.  Cardiac risk factors are negative for  tobacco, diabetes, hypertension, unknown lipids, father with MI at age 41.  Initial set of enzymes in the emergency room were negative but  given his  family history it was felt reasonable to admit patient to telemetry for rule  out enzymes and observation.  His enzymes remained normal throughout this  hospitalization and patient had no recurrence of his chest pain.  It should  be noted patient was never given nitroglycerin as his chest pain had  resolved before he got to the emergency room.  It is unclear if his muscular  pain was a pleuritic type event or musculoskeletal.  It should also be noted  his O2 saturations remained completely normal throughout this  hospitalization.  An outpatient Cardiolite was arranged with Willa Rough, M.D. to be performed the day after admission to further risk stratify.  Results of this will be sent to patient's chosen primary care physician as  stated above.  His EKG was normal sinus rhythm without any ST or T changes.   CONDITION ON DISCHARGE:  Medically  stable.                                               Rene Paci, M.D. [facility]    VL/MEDQ  D:  11/22/2002  T:  11/22/2002  Job:  [id number]   cc:   Wanda Plump, MD [facility]  [phone] [address] [place], [place] [address]

## 2010-12-11 ENCOUNTER — Ambulatory Visit (INDEPENDENT_AMBULATORY_CARE_PROVIDER_SITE_OTHER): Payer: Medicare Other | Admitting: Internal Medicine

## 2010-12-11 ENCOUNTER — Encounter: Payer: Self-pay | Admitting: Internal Medicine

## 2010-12-11 DIAGNOSIS — M1990 Unspecified osteoarthritis, unspecified site: Secondary | ICD-10-CM

## 2010-12-11 DIAGNOSIS — E785 Hyperlipidemia, unspecified: Secondary | ICD-10-CM

## 2010-12-11 NOTE — Progress Notes (Signed)
  Subjective:    Patient ID: Brandon Burke, male    DOB: May 17, 1931, 77 y.o.   MRN: [id number]  HPI Routine office visit, feels very well, in fact states he has not felt this well in a while.  Past Medical History: MVP Prostate cancer , released from urology in 2010,needs yearly PSAs with PCP Hyperlipidemia DVT, hx of 2001 (after prostate surgery) PE 5-09:was referred to hematology, and they recommend to discontinue Coumadin 5/11  Past Surgical History: Prostatectomy ( aprox. 2000) Cataract extraction  Family History:  CAD-- father have heart disease on his mid 70s DM--no prostate ca--no colon cancer--no  Social History: Never Smoked Alcohol use-no Married, wife w/ melanoma, advanced  no children diet-- healthy for the most part  exercise-- trying to walk most days Does Patient Exercise:  yes  Review of Systems Denies chest pain or shortness of breath Good medication compliance with routine medicines. He was having problems with hip pain, doing  better, has discontinue pain medication. His wife (she is my pt) has melanoma, fortunately she is doing well.    Objective:   Physical Exam  Constitutional: He is oriented to person, place, and time. He appears well-developed and well-nourished.  Cardiovascular: Normal rate and regular rhythm.   No murmur heard. Pulmonary/Chest: Effort normal. No respiratory distress. He has no wheezes. He has no rales.  Abdominal: Soft. Bowel sounds are normal. He exhibits no distension. There is no tenderness. There is no rebound.  Musculoskeletal: He exhibits no edema.  Neurological: He is alert and oriented to person, place, and time.          Assessment & Plan:  Counseled about his wife

## 2010-12-11 NOTE — Assessment & Plan Note (Signed)
Doing great, hip pain has decreased, not needing essentially any medication

## 2010-12-11 NOTE — Assessment & Plan Note (Signed)
Well-controlled, no change 

## 2011-01-27 LAB — CBC
MCV: 97.3
Platelets: 164
Platelets: 180
WBC: 6.6
WBC: 7.4

## 2011-01-27 LAB — PROTIME-INR
Prothrombin Time: 24 — ABNORMAL HIGH
Prothrombin Time: 29.1 — ABNORMAL HIGH

## 2011-01-27 LAB — HEPARIN LEVEL (UNFRACTIONATED): Heparin Unfractionated: 0.43

## 2011-01-28 LAB — BASIC METABOLIC PANEL
Calcium: 9.3
GFR calc Af Amer: >60
GFR calc non Af Amer: >60
Glucose, Bld: 98
Sodium: 139

## 2011-01-28 LAB — PROTIME-INR
INR: 2 — ABNORMAL HIGH
INR: 2 — ABNORMAL HIGH
Prothrombin Time: 23.6 — ABNORMAL HIGH

## 2011-01-28 LAB — CBC
Hemoglobin: 14.2
RBC: 4.3
RDW: 12.6

## 2011-02-08 ENCOUNTER — Telehealth: Payer: Self-pay | Admitting: *Deleted

## 2011-02-08 MED ORDER — PRAVASTATIN SODIUM 20 MG PO TABS: 20.0000 mg | ORAL_TABLET | Freq: Every day | ORAL | Status: DC

## 2011-02-08 MED ORDER — ATENOLOL 50 MG PO TABS: 50.0000 mg | ORAL_TABLET | Freq: Every day | ORAL | Status: DC

## 2011-02-08 NOTE — Telephone Encounter (Signed)
Pt request for: Pravastatin, Atenolol refills #90 to [facility] [[facility] Done.

## 2011-06-22 ENCOUNTER — Ambulatory Visit (INDEPENDENT_AMBULATORY_CARE_PROVIDER_SITE_OTHER): Payer: Medicare Other | Admitting: Internal Medicine

## 2011-06-22 ENCOUNTER — Encounter: Payer: Self-pay | Admitting: Internal Medicine

## 2011-06-22 VITALS — BP 132/80 | HR 58 | Temp 97.7°F | Ht 72.5 in | Wt 168.0 lb

## 2011-06-22 DIAGNOSIS — M1990 Unspecified osteoarthritis, unspecified site: Secondary | ICD-10-CM

## 2011-06-22 DIAGNOSIS — I059 Rheumatic mitral valve disease, unspecified: Secondary | ICD-10-CM

## 2011-06-22 DIAGNOSIS — Z0000 Encounter for general adult medical examination without abnormal findings: Secondary | ICD-10-CM | POA: Insufficient documentation

## 2011-06-22 DIAGNOSIS — E785 Hyperlipidemia, unspecified: Secondary | ICD-10-CM

## 2011-06-22 DIAGNOSIS — Z8546 Personal history of malignant neoplasm of prostate: Secondary | ICD-10-CM

## 2011-06-22 DIAGNOSIS — Z23 Encounter for immunization: Secondary | ICD-10-CM

## 2011-06-22 LAB — COMPREHENSIVE METABOLIC PANEL
AST: 23 U/L (ref 0–37)
BUN: 18 mg/dL (ref 6–23)
CO2: 24 mEq/L (ref 19–32)
Calcium: 10 mg/dL (ref 8.4–10.5)
Chloride: 107 mEq/L (ref 96–112)
Creatinine, Ser: 1 mg/dL (ref 0.4–1.5)
GFR: 78.72 mL/min (ref >60.00)
Glucose, Bld: 86 mg/dL (ref 70–99)

## 2011-06-22 LAB — LIPID PANEL
Cholesterol: 141 mg/dL (ref 0–200)
HDL: 34.9 mg/dL — ABNORMAL LOW (ref >39.00)
Triglycerides: 130 mg/dL (ref 0.0–149.0)

## 2011-06-22 NOTE — Assessment & Plan Note (Signed)
Good med compliance , labs  

## 2011-06-22 NOTE — Assessment & Plan Note (Addendum)
Doing great, hip pain barely an issue

## 2011-06-22 NOTE — Assessment & Plan Note (Addendum)
Will check a PSA.

## 2011-06-22 NOTE — Assessment & Plan Note (Addendum)
Td 2002 pneumonia shot  got his second shot in 2006 shingles shot: had already cscope 01-2005, had tics, next 2016 status post prostatectomy for prostate cancer around 2000, discharge from urology  2010, Rx  yearly PSAs ---checking today counseled: continue trying to exercise daily, continue with his healthy diet

## 2011-06-22 NOTE — Progress Notes (Signed)
  Subjective:    Patient ID: Brandon Burke, male    DOB: Oct 05, 1931, 78 y.o.   MRN: [id number]  HPI Here for Medicare AWV: 1. Risk factors based on Past M, S, F history: reviewed  2. Physical Activities: active at home, stationary bike, takes walks weather permit  3. Depression/mood: wife w/ cancer, she is stable, feels well 4. Hearing: some difficult on the L, not worse, has not seen an audiologist  5. ADL's: independent , still drive  6. Fall Risk: no recent falls, does climbs ladders sometimes , prevention discussed  7. Home Safety: does feel safe at home  8. Height, weight, &visual acuity: see VS, sees eye doctor routinely  9. Counseling: yes  10. Labs ordered based on risk factors: yes  11.           Referral Coordination, if needed  12.           Care Plan, see a/p  13.            Cognitive Assessment : cognition, memory and motor skill seem appropiate   in addition, we discussed the following Cholesterol-- good med compliance  DJD-- had hip pain at some point,  definitely improved  Prostate cancer-- was released by urology, needs PSAs yearly for now Mild constipation, on metamucil regularly, that has decreased the need for lactulose   Past Medical History: MVP Prostate cancer , released from urology in 2010,needs yearly PSAs with PCP Hyperlipidemia DVT, hx of 2001 (after prostate surgery) PE 5-09:was referred to hematology, and they recommend to discontinue Coumadin 5/11  Past Surgical History: Prostatectomy ( aprox. 2000) Cataract extraction  Family History: CAD-- father have heart disease on his mid 19s DM--no Strokes--no prostate ca--no colon cancer--no  Social History: Never Smoked Alcohol use-no Married, wife w/ melanoma, advanced  no children diet-- healthy for the most part  exercise-- trying to walk most days , see above   Review of Systems  Constitutional: Negative for fever, fatigue and unexpected weight change.  Respiratory: Negative for  cough, shortness of breath and wheezing.   Cardiovascular: Negative for chest pain and leg swelling.  Gastrointestinal: Negative for abdominal pain and blood in stool.  Genitourinary: Negative for dysuria, hematuria and difficulty urinating.       Objective:   Physical Exam  Constitutional: He is oriented to person, place, and time. He appears well-developed and well-nourished.  HENT:  Head: Normocephalic and atraumatic.  Neck: No thyromegaly present.       Nl carotid pulse  Cardiovascular: Normal rate, regular rhythm and normal heart sounds.   No murmur heard. Pulmonary/Chest: Effort normal and breath sounds normal. He has no wheezes. He has no rales.  Abdominal: Soft. Bowel sounds are normal. He exhibits no distension. There is no tenderness. There is no rebound and no guarding.  Musculoskeletal: He exhibits no edema.  Neurological: He is alert and oriented to person, place, and time.  Skin: Skin is warm and dry.  Psychiatric: He has a normal mood and affect. His behavior is normal. Judgment and thought content normal.          Assessment & Plan:

## 2011-06-22 NOTE — Assessment & Plan Note (Signed)
asx .  EKG

## 2011-06-24 ENCOUNTER — Encounter: Payer: Self-pay | Admitting: *Deleted

## 2011-08-09 ENCOUNTER — Telehealth: Payer: Self-pay | Admitting: *Deleted

## 2011-08-09 ENCOUNTER — Telehealth: Payer: Self-pay | Admitting: Internal Medicine

## 2011-08-09 MED ORDER — PRAVASTATIN SODIUM 20 MG PO TABS: 20.0000 mg | ORAL_TABLET | Freq: Every day | ORAL | Status: DC

## 2011-08-09 MED ORDER — ATENOLOL 50 MG PO TABS: 50.0000 mg | ORAL_TABLET | Freq: Every day | ORAL | Status: DC

## 2011-08-09 NOTE — Telephone Encounter (Signed)
Refill done.  

## 2011-08-09 NOTE — Telephone Encounter (Signed)
error 

## 2012-02-17 ENCOUNTER — Other Ambulatory Visit: Payer: Self-pay

## 2012-02-17 MED ORDER — CYCLOBENZAPRINE HCL 5 MG PO TABS: 5.0000 mg | ORAL_TABLET | Freq: Two times a day (BID) | ORAL | Status: DC | PRN

## 2012-02-17 NOTE — Telephone Encounter (Signed)
Pt states needs Flexeril Rx like to keep some on hand due to muscles in back strained. Ov 06/22/11, last filled looks like 12/11/10 historical med.  Plz advise   MW

## 2012-02-17 NOTE — Telephone Encounter (Signed)
Discussed with pt, sent in rx.  

## 2012-02-17 NOTE — Telephone Encounter (Signed)
Flexeril 5 mg, 1 by mouth twice a day #40, no refills. Please warn pt about drowsiness. If pain is severe -->  needs office visit.

## 2012-03-03 ENCOUNTER — Observation Stay (HOSPITAL_COMMUNITY)
Admission: EM | Admit: 2012-03-03 | Discharge: 2012-03-03 | Disposition: A | Discharge status: Home / Self Care | Payer: Medicare Other | Attending: Family Medicine | Admitting: Family Medicine

## 2012-03-03 ENCOUNTER — Observation Stay (HOSPITAL_COMMUNITY): Payer: Medicare Other

## 2012-03-03 ENCOUNTER — Encounter (HOSPITAL_COMMUNITY): Payer: Self-pay | Admitting: Emergency Medicine

## 2012-03-03 ENCOUNTER — Emergency Department (HOSPITAL_COMMUNITY): Payer: Medicare Other

## 2012-03-03 DIAGNOSIS — R002 Palpitations: Secondary | ICD-10-CM

## 2012-03-03 DIAGNOSIS — E785 Hyperlipidemia, unspecified: Secondary | ICD-10-CM | POA: Diagnosis present

## 2012-03-03 DIAGNOSIS — Z86711 Personal history of pulmonary embolism: Secondary | ICD-10-CM

## 2012-03-03 DIAGNOSIS — I059 Rheumatic mitral valve disease, unspecified: Secondary | ICD-10-CM | POA: Insufficient documentation

## 2012-03-03 DIAGNOSIS — R079 Chest pain, unspecified: Principal | ICD-10-CM | POA: Diagnosis present

## 2012-03-03 DIAGNOSIS — R9431 Abnormal electrocardiogram [ECG] [EKG]: Secondary | ICD-10-CM

## 2012-03-03 DIAGNOSIS — N400 Enlarged prostate without lower urinary tract symptoms: Secondary | ICD-10-CM | POA: Insufficient documentation

## 2012-03-03 DIAGNOSIS — Z86718 Personal history of other venous thrombosis and embolism: Secondary | ICD-10-CM

## 2012-03-03 DIAGNOSIS — R072 Precordial pain: Secondary | ICD-10-CM

## 2012-03-03 HISTORY — DX: Malignant neoplasm of prostate: C61

## 2012-03-03 HISTORY — DX: Nonrheumatic mitral (valve) prolapse: I34.1

## 2012-03-03 HISTORY — DX: Acute embolism and thrombosis of unspecified deep veins of unspecified lower extremity: I82.409

## 2012-03-03 HISTORY — DX: Other pulmonary embolism without acute cor pulmonale: I26.99

## 2012-03-03 LAB — CBC WITH DIFFERENTIAL/PLATELET
Eosinophils Absolute: 0.2 10*3/uL (ref 0.0–0.7)
Eosinophils Relative: 4 % (ref 0–5)
HCT: 38.2 % — ABNORMAL LOW (ref 39.0–52.0)
Hemoglobin: 13.5 g/dL (ref 13.0–17.0)
Lymphocytes Relative: 23 % (ref 12–46)
Lymphs Abs: 0.9 10*3/uL (ref 0.7–4.0)
MCH: 33 pg (ref 26.0–34.0)
MCV: 93.4 fL (ref 78.0–100.0)
Monocytes Relative: 12 % (ref 3–12)
RBC: 4.09 MIL/uL — ABNORMAL LOW (ref 4.22–5.81)

## 2012-03-03 LAB — CREATININE, SERUM
Creatinine, Ser: 1.07 mg/dL (ref 0.50–1.35)
GFR calc non Af Amer: 64 mL/min — ABNORMAL LOW (ref >90)

## 2012-03-03 LAB — POCT I-STAT, CHEM 8
BUN: 22 mg/dL (ref 6–23)
Creatinine, Ser: 1.1 mg/dL (ref 0.50–1.35)
Glucose, Bld: 106 mg/dL — ABNORMAL HIGH (ref 70–99)
Hemoglobin: 12.9 g/dL — ABNORMAL LOW (ref 13.0–17.0)
TCO2: 24 mmol/L (ref 0–100)

## 2012-03-03 LAB — D-DIMER, QUANTITATIVE: D-Dimer, Quant: <0.27 ug/mL-FEU (ref 0.00–0.48)

## 2012-03-03 LAB — GLUCOSE, CAPILLARY: Glucose-Capillary: 89 mg/dL (ref 70–99)

## 2012-03-03 MED ORDER — IOHEXOL 350 MG/ML SOLN: 80.0000 mL | Freq: Once | INTRAVENOUS | Status: DC | PRN

## 2012-03-03 MED ORDER — FOLIC ACID 400 MCG PO TABS: 400.0000 ug | ORAL_TABLET | Freq: Every day | ORAL | Status: DC

## 2012-03-03 MED ORDER — ADULT MULTIVITAMIN W/MINERALS CH
1.0000 | ORAL_TABLET | Freq: Every day | ORAL | Status: DC
  Administered 2012-03-03: 1 via ORAL
  Filled 2012-03-03: qty 1

## 2012-03-03 MED ORDER — SODIUM CHLORIDE 0.9 % IV BOLUS (SEPSIS)
500.0000 mL | Freq: Once | INTRAVENOUS | Status: AC
  Administered 2012-03-03: 500 mL via INTRAVENOUS

## 2012-03-03 MED ORDER — HEPARIN SODIUM (PORCINE) 5000 UNIT/ML IJ SOLN
5000.0000 [IU] | Freq: Three times a day (TID) | INTRAMUSCULAR | Status: DC
  Administered 2012-03-03: 5000 [IU] via SUBCUTANEOUS
  Filled 2012-03-03 (×4): qty 1

## 2012-03-03 MED ORDER — SODIUM CHLORIDE 0.9 % IJ SOLN
3.0000 mL | Freq: Two times a day (BID) | INTRAMUSCULAR | Status: DC
  Administered 2012-03-03: 3 mL via INTRAVENOUS

## 2012-03-03 MED ORDER — ISOSORBIDE MONONITRATE 15 MG HALF TABLET: 15.0000 mg | ORAL_TABLET | Freq: Every day | ORAL | Status: DC

## 2012-03-03 MED ORDER — FOLIC ACID 1 MG PO TABS
1.0000 mg | ORAL_TABLET | Freq: Every day | ORAL | Status: DC
  Administered 2012-03-03: 1 mg via ORAL
  Filled 2012-03-03: qty 1

## 2012-03-03 MED ORDER — ASPIRIN 325 MG PO TABS
325.0000 mg | ORAL_TABLET | Freq: Every day | ORAL | Status: DC
  Filled 2012-03-03: qty 1

## 2012-03-03 MED ORDER — SODIUM CHLORIDE 0.9 % IV SOLN: 250.0000 mL | INTRAVENOUS | Status: DC | PRN

## 2012-03-03 MED ORDER — SIMVASTATIN 10 MG PO TABS
10.0000 mg | ORAL_TABLET | Freq: Every day | ORAL | Status: DC
  Filled 2012-03-03: qty 1

## 2012-03-03 MED ORDER — CYCLOBENZAPRINE HCL 5 MG PO TABS
5.0000 mg | ORAL_TABLET | Freq: Three times a day (TID) | ORAL | Status: DC | PRN
  Filled 2012-03-03: qty 1

## 2012-03-03 MED ORDER — NITROGLYCERIN 0.4 MG SL SUBL: 0.4000 mg | SUBLINGUAL_TABLET | SUBLINGUAL | Status: DC | PRN

## 2012-03-03 MED ORDER — GLUCOSAMINE-CHONDROITIN 500-400 MG PO TABS: 1.0000 | ORAL_TABLET | Freq: Every day | ORAL | Status: DC

## 2012-03-03 MED ORDER — ATENOLOL 50 MG PO TABS
50.0000 mg | ORAL_TABLET | Freq: Every day | ORAL | Status: DC
  Administered 2012-03-03: 50 mg via ORAL
  Filled 2012-03-03: qty 1

## 2012-03-03 MED ORDER — SUPER HIGH VITAMINS/MINERALS PO TABS: 1.0000 | ORAL_TABLET | Freq: Every day | ORAL | Status: DC

## 2012-03-03 MED ORDER — PRAVASTATIN SODIUM 20 MG PO TABS: 40.0000 mg | ORAL_TABLET | Freq: Every day | ORAL | Status: DC

## 2012-03-03 MED ORDER — SODIUM CHLORIDE 0.9 % IJ SOLN: 3.0000 mL | INTRAMUSCULAR | Status: DC | PRN

## 2012-03-03 NOTE — Discharge Summary (Signed)
Physician Discharge Summary  Brandon Mitra Lebo WJX:[id number] DOB: 07/30/1931 DOA: 03/03/2012  PCP: Willow Ora, MD  Admit date: 03/03/2012 Discharge date: 03/03/2012  Time spent: 20 minutes  Recommendations for Outpatient Follow-up:  1. Patient will follow up with Dr. Algie Coffer who saw patient briefly in hospital for Exercise stress test early next week 2. Patient's statin has been increased to 40 mg each bedtime 3. Patient was started on low-dose Imdur 15 mg daily.  Discharge Diagnoses:  Principal Problem:  *Chest pain Active Problems:  HYPERLIPIDEMIA  DVT, HX OF   Discharge Condition: Good  Diet recommendation: Low-salt heart healthy  Filed Weights   03/03/12 0500  Weight: 74.3 kg (163 lb 12.8 oz)    History of present illness:  This 78 year old male with history of benign prostatic hypertrophy palpitations which are chronic, known mitral valve prolapse diagnosed in [place] a long time ago, history of pulmonary embolism presented to [facility] emergency room with tachycardia and mild left arm pain on day of admission as per Dr. Olena Leatherwood notes. This is made better by nitroglycerin in the emergency room. Patient had no shortness of breath.. Given his prior history of pulmonary embolus and DVT in a setting of prosthetic cancer that has been treated in the past, he underwent a CT angiogram as well as an echocardiogram which did not show any wall motion abnormality. However his CT angiogram showed significant burden of coronary calcification and as a result of this Dr. Algie Coffer was consulted. Dr Algie Coffer did speak with the patient in the room with me and recommended potential risk ratification-patient will need followup with him in 3-5 days for an outpatient exercise stress test. He potentially will also need to have a lipid panel to risk stratify him and he has been placed on Imdur 50 mg which should be held if patient becomes a little bit high lightheaded or  hypotensive.    Procedures:  Echocardiogram  CT scan chest (i.e. Studies not automatically included, echos, thoracentesis, etc; not x-rays)  Consultations:  Cardiology  Discharge Exam: Filed Vitals:   03/03/12 0500 03/03/12 0617 03/03/12 1117 03/03/12 1358  BP: 152/89 138/82 136/77 137/71  Pulse: 62  69 72  Temp: 97.9 F (36.6 C)   98.5 F (36.9 C)  TempSrc: Oral   Oral  Resp: 16   18  Height: 5\' 11"  (1.803 m)     Weight: 74.3 kg (163 lb 12.8 oz)     SpO2: 97%   96%     Discharge Instructions     Medication List     As of 03/03/2012  5:49 PM    STOP taking these medications         aspirin EC 325 MG tablet      TAKE these medications         aspirin 325 MG tablet   Take 325 mg by mouth at bedtime.      atenolol 50 MG tablet   Commonly known as: TENORMIN   Take 1 tablet (50 mg total) by mouth daily.      cyclobenzaprine 5 MG tablet   Commonly known as: FLEXERIL   Take 5 mg by mouth 3 (three) times daily as needed. For pain      folic acid 400 MCG tablet   Commonly known as: FOLVITE   Take 400 mcg by mouth daily.      glucosamine-chondroitin 500-400 MG tablet   Take 1 tablet by mouth daily.      isosorbide mononitrate 15  mg Tb24   Commonly known as: IMDUR   Take 0.5 tablets (15 mg total) by mouth daily.      multivitamin,tx-minerals tablet   Take 1 tablet by mouth daily.      pravastatin 20 MG tablet   Commonly known as: PRAVACHOL   Take 2 tablets (40 mg total) by mouth daily.          The results of significant diagnostics from this hospitalization (including imaging, microbiology, ancillary and laboratory) are listed below for reference.    Significant Diagnostic Studies: Dg Chest 2 View  03/03/2012  *RADIOLOGY REPORT*  Clinical Data: Shortness of breath and chest pressure.  CHEST - 2 VIEW  Comparison: CT chest 10/30/2009 and PA and lateral chest 09/13/2007.  Findings: Minimal linear atelectasis or scarring is seen in the lung bases.   The lungs otherwise appear clear.  Heart size is normal.  No pneumothorax or pleural effusion.  IMPRESSION: No acute disease.   Original Report Authenticated By: Holley Dexter, M.D.    Ct Angio Chest Pe W/cm &/or Wo Cm  03/03/2012  *RADIOLOGY REPORT*  Clinical Data: Rule out pulmonary embolus  CT ANGIOGRAPHY CHEST  Technique:  Multidetector CT imaging of the chest using the standard protocol during bolus administration of intravenous contrast. Multiplanar reconstructed images including MIPs were obtained and reviewed to evaluate the vascular anatomy.  Contrast:  80 ml of omni 300  Comparison: 10/30/2009  Findings:  Lungs/pleura: Again noted is a small area of loculated pleural fluid within the medial right lung base.  This is unchanged measuring approximately 2.5 x 5.0 cm, image 76.  Dependent changes and subsegmental atelectasis noted within the posterior lung bases. No airspace consolidation.  There is a clustered area of peribronchovascular nodules within the right middle lobe, image 60. These are new from previous exam.  Small granuloma is identified within the right upper lobe, image 43.  Heart/Mediastinum: No supraclavicular or axillary adenopathy.  No enlarged mediastinal or hilar lymph nodes.  Calcifications within the LAD and RCA coronary arteries noted.  Heart size appears normal.  No pericardial effusion.  The pulmonary arteries are patent.  Upper abdomen: Simple appearing cyst is identified within the dome of liver, image 82.  Smaller hypodensities are also along the dome of the liver measures 4.3 mm.  This is unchanged from previous exam and remains too small to characterize.  Bones/Musculoskeletal:  Review of the visualized bony structures is unremarkable.  IMPRESSION: 1.   There is no evidence for acute pulmonary embolus. 2.  Dependent changes and subsegmental atelectasis present within both lung bases. 3.  Stable focal area of loculated pleural fluid within the right base.  This is unchanged  since 10/30/2009. 4.  New clustered area of peribronchovascular nodularity within the right middle lobe.  Most likely the sequela of atypical inflammation or infection. 5.  Prominent coronary artery calcifications involving the LAD and RCA.   Original Report Authenticated By: Signa Kell, M.D.     Microbiology: No results found for this or any previous visit (from the past 240 hour(s)).   Labs: Basic Metabolic Panel:  Lab 03/03/12 4132 03/03/12 0100  NA -- 142  K -- 3.8  CL -- 105  CO2 -- --  GLUCOSE -- 106*  BUN -- 22  CREATININE 1.07 1.10  CALCIUM -- --  MG -- --  PHOS -- --   Liver Function Tests: No results found for this basename: AST:5,ALT:5,ALKPHOS:5,BILITOT:5,PROT:5,ALBUMIN:5 in the last 168 hours No results found for this basename:  LIPASE:5,AMYLASE:5 in the last 168 hours No results found for this basename: AMMONIA:5 in the last 168 hours CBC:  Lab 03/03/12 0100 03/03/12 0044  WBC -- 4.1  NEUTROABS -- 2.5  HGB 12.9* 13.5  HCT 38.0* 38.2*  MCV -- 93.4  PLT -- 125*   Cardiac Enzymes: No results found for this basename: CKTOTAL:5,CKMB:5,CKMBINDEX:5,TROPONINI:5 in the last 168 hours BNP: BNP (last 3 results) No results found for this basename: PROBNP:3 in the last 8760 hours CBG:  Lab 03/03/12 0735  GLUCAP 89       Signed:  Rhetta Mura  [facility] 03/03/2012, 5:49 PM

## 2012-03-03 NOTE — Progress Notes (Signed)
  Echocardiogram 2D Echocardiogram has been performed.  Alvester Eads 03/03/2012, 10:42 AM

## 2012-03-03 NOTE — H&P (Signed)
[facility] History and Physical  Brandon Burke [facility] ION:[id number] DOB: March 09, 1932 DOA: 03/03/2012  Referring physician: ED PCP: Willow Ora, MD  Specialists: None  Chief Complaint: CP  HPI: Brandon Burke is a 78 y.o. male who presents with palpitation chest pain, onset earlier this evening.  Nothing made it worse, located in L arm, no associated SOB.  Made better with nitro in ED.  Patient has history of DVT/PE x2 in the past 1 of the 2 was provoked the other was not.  Hospitalist service has been asked to admit for CP r/o cardiac and PE.  Review of Systems: 12 systems reviewed and negative  Past Medical History  Diagnosis Date  . Mitral valve prolapse   . PE (pulmonary embolism)   . DVT (deep venous thrombosis)   . High blood cholesterol   . Prostate cancer    Past Surgical History  Procedure Date  . Prostatectomy    Social History:  reports that he has never smoked. He does not have any smokeless tobacco history on file. He reports that he does not drink alcohol or use illicit drugs.  Allergies  Allergen Reactions  . Sulfonamide Derivatives Nausea And Vomiting    No family history on file. No family history of clotting disorders, dad had CAD and died age 38.  Prior to Admission medications   Medication Sig Start Date End Date Taking? Authorizing Provider  aspirin 325 MG tablet Take 325 mg by mouth at bedtime.     Yes Historical Provider, MD  atenolol (TENORMIN) 50 MG tablet Take 1 tablet (50 mg total) by mouth daily. 08/09/11  Yes Wanda Plump, MD  cyclobenzaprine (FLEXERIL) 5 MG tablet Take 5 mg by mouth 3 (three) times daily as needed. For pain   Yes Historical Provider, MD  folic acid (FOLVITE) 400 MCG tablet Take 400 mcg by mouth daily.     Yes Historical Provider, MD  glucosamine-chondroitin 500-400 MG tablet Take 1 tablet by mouth daily.     Yes Historical Provider, MD  Multiple Vitamins-Minerals (MULTIVITAMIN,TX-MINERALS) tablet Take 1 tablet by mouth  daily.     Yes Historical Provider, MD  pravastatin (PRAVACHOL) 20 MG tablet Take 1 tablet (20 mg total) by mouth daily. 08/09/11  Yes Wanda Plump, MD   Physical Exam: Filed Vitals:   03/03/12 0011 03/03/12 0100 03/03/12 0319  BP: 150/68 133/71 140/75  Pulse: 76 69 72  Temp: 97.9 F (36.6 C)    TempSrc: Oral    Resp: 25 18 14   SpO2: 97% 99% 98%    General:  NAD, resting comfortably in bed Eyes: PEERLA EOMI ENT: mucous membranes moist Neck: supple w/o JVD Cardiovascular: RRR w/o MRG Respiratory: CTA B Abdomen: soft, nt, nd, bs+ Skin: no rash nor lesion Musculoskeletal: MAE, full ROM all 4 extremities Psychiatric: normal tone and affect Neurologic: AAOx3, grossly non-focal   Labs on Admission:  Basic Metabolic Panel:  Lab 03/03/12 2440  NA 142  K 3.8  CL 105  CO2 --  GLUCOSE 106*  BUN 22  CREATININE 1.10  CALCIUM --  MG --  PHOS --   Liver Function Tests: No results found for this basename: AST:5,ALT:5,ALKPHOS:5,BILITOT:5,PROT:5,ALBUMIN:5 in the last 168 hours No results found for this basename: LIPASE:5,AMYLASE:5 in the last 168 hours No results found for this basename: AMMONIA:5 in the last 168 hours CBC:  Lab 03/03/12 0100 03/03/12 0044  WBC -- 4.1  NEUTROABS -- 2.5  HGB 12.9* 13.5  HCT 38.0* 38.2*  MCV -- 93.4  PLT -- 125*   Cardiac Enzymes: No results found for this basename: CKTOTAL:5,CKMB:5,CKMBINDEX:5,TROPONINI:5 in the last 168 hours  BNP (last 3 results) No results found for this basename: PROBNP:3 in the last 8760 hours CBG: No results found for this basename: GLUCAP:5 in the last 168 hours  Radiological Exams on Admission: Dg Chest 2 View  03/03/2012  *RADIOLOGY REPORT*  Clinical Data: Shortness of breath and chest pressure.  CHEST - 2 VIEW  Comparison: CT chest 10/30/2009 and PA and lateral chest 09/13/2007.  Findings: Minimal linear atelectasis or scarring is seen in the lung bases.  The lungs otherwise appear clear.  Heart size is normal.   No pneumothorax or pleural effusion.  IMPRESSION: No acute disease.   Original Report Authenticated By: Holley Dexter, M.D.     EKG: Independently reviewed.  ST segment depressions in leads 2,3, avf, appear old  Assessment/Plan Principal Problem:  *Chest pain Active Problems:  HYPERLIPIDEMIA   1. Chest pain - palpitations with chest pain, ddx PE vs cardiac vs other, d. Dimer pending, doppler US pending, 2d echo pending, needs cards to r/o ACS. 2. Hyperlipidemia - only risk factor for ACS but this is very well controlled over past few years.  Code Status: Full Code (must indicate code status--if unknown or must be presumed, indicate so) Family Communication: Spoke with patient and wife (indicate person spoken with, if applicable, with phone number if by telephone) Disposition Plan: Admit to obs (indicate anticipated LOS)  Time spent: 50 min  Thanvi Blincoe M. [facility] Pager [phone]  If 7PM-7AM, please contact night-coverage [email or web address] Password [facility] 03/03/2012, 3:54 AM

## 2012-03-03 NOTE — Progress Notes (Signed)
PHARMACIST - PHYSICIAN ORDER COMMUNICATION  CONCERNING: P&T Medication Policy on Herbal Medications  DESCRIPTION:  This patient's order for:  Glucosamine-chondroitin  has been noted.  This product(s) is classified as an "herbal" or natural product. Due to a lack of definitive safety studies or FDA approval, nonstandard manufacturing practices, plus the potential risk of unknown drug-drug interactions while on inpatient medications, the Pharmacy and Therapeutics Committee does not permit the use of "herbal" or natural products of this type within [facility].   ACTION TAKEN: The pharmacy department is unable to verify this order at this time and your patient has been informed of this safety policy. Please reevaluate patient's clinical condition at discharge and address if the herbal or natural product(s) should be resumed at that time.   Christoper Fabian, PharmD, BCPS Clinical pharmacist, pager [phone] 03/03/2012   5:21 AM

## 2012-03-03 NOTE — ED Provider Notes (Signed)
History     CSN: [id number]  Arrival date & time 03/03/12  0009   First MD Initiated Contact with Patient 03/03/12 0012      Chief Complaint  Patient presents with  . Palpitations    (Consider location/radiation/quality/duration/timing/severity/associated sxs/prior treatment) Patient is a 78 y.o. male presenting with palpitations and chest pain. The history is provided by the patient. No language interpreter was used.  Palpitations  This is a new problem. The current episode started 1 to 2 hours ago. The problem occurs constantly. The problem has not changed since onset.Associated with: none. Associated symptoms include chest pain and chest pressure. Pertinent negatives include no diaphoresis, no fever, no lower extremity edema and no shortness of breath. He has tried nothing for the symptoms. The treatment provided no relief. Risk factors include dyslipidemia. His past medical history does not include valve disorder.  Chest Pain The chest pain began 1 - 2 hours ago. Chest pain occurs constantly. The chest pain is unchanged. Associated with: walking across the room. The pain is currently at 4/10. The severity of the pain is mild. The quality of the pain is described as tightness. Radiates to: across chest. Primary symptoms include palpitations. Pertinent negatives for primary symptoms include no fever and no shortness of breath.  The palpitations did not occur with shortness of breath.  Pertinent negatives for associated symptoms include no diaphoresis and no lower extremity edema. He tried aspirin for the symptoms. Risk factors include being elderly and male gender.  Pertinent negatives for past medical history include no MI and no valve disorder.  Procedure history is negative for cardiac catheterization.     Past Medical History  Diagnosis Date  . Mitral valve prolapse   . PE (pulmonary embolism)   . DVT (deep venous thrombosis)   . High blood cholesterol   . Prostate cancer      Past Surgical History  Procedure Date  . Prostatectomy     No family history on file.  History  Substance Use Topics  . Smoking status: Never Smoker   . Smokeless tobacco: Not on file  . Alcohol Use: No      Review of Systems  Constitutional: Negative for fever and diaphoresis.  Respiratory: Negative for shortness of breath.   Cardiovascular: Positive for chest pain and palpitations.  All other systems reviewed and are negative.    Allergies  Sulfonamide derivatives  Home Medications   Current Outpatient Rx  Name Route Sig Dispense Refill  . ASPIRIN 325 MG PO TABS Oral Take 325 mg by mouth at bedtime.      . ASPIRIN EC 325 MG PO TBEC Oral Take 650 mg by mouth once.    . ATENOLOL 50 MG PO TABS Oral Take 1 tablet (50 mg total) by mouth daily. 90 tablet 3  . CYCLOBENZAPRINE HCL 5 MG PO TABS Oral Take 5 mg by mouth 3 (three) times daily as needed. For pain    . FOLIC ACID 400 MCG PO TABS Oral Take 400 mcg by mouth daily.      Marland Kitchen GLUCOSAMINE-CHONDROITIN 500-400 MG PO TABS Oral Take 1 tablet by mouth daily.      . SUPER HIGH VITAMINS/MINERALS PO TABS Oral Take 1 tablet by mouth daily.      Marland Kitchen PRAVASTATIN SODIUM 20 MG PO TABS Oral Take 1 tablet (20 mg total) by mouth daily. 90 tablet 3    BP 133/71  Pulse 69  Temp 97.9 F (36.6 C) (Oral)  Resp 18  SpO2 99%  Physical Exam  Constitutional: He is oriented to person, place, and time. He appears well-developed and well-nourished. No distress.  HENT:  Head: Normocephalic and atraumatic.  Mouth/Throat: Oropharynx is clear and moist.  Eyes: Conjunctivae normal are normal. Pupils are equal, round, and reactive to light.  Neck: Normal range of motion. Neck supple. No JVD present.  Cardiovascular: Normal rate, regular rhythm and intact distal pulses.   Pulmonary/Chest: Effort normal and breath sounds normal. He has no wheezes. He has no rales.  Abdominal: Soft. Bowel sounds are normal. There is no tenderness. There is no  rebound and no guarding.  Musculoskeletal: Normal range of motion.  Neurological: He is alert and oriented to person, place, and time.  Skin: Skin is warm and dry.  Psychiatric: He has a normal mood and affect.    ED Course  Procedures (including critical care time)  Labs Reviewed  CBC WITH DIFFERENTIAL - Abnormal; Notable for the following:    RBC 4.09 (*)     HCT 38.2 (*)     Platelets 125 (*)     All other components within normal limits  POCT I-STAT, CHEM 8 - Abnormal; Notable for the following:    Glucose, Bld 106 (*)     Hemoglobin 12.9 (*)     HCT 38.0 (*)     All other components within normal limits  POCT I-STAT TROPONIN I   Dg Chest 2 View  03/03/2012  *RADIOLOGY REPORT*  Clinical Data: Shortness of breath and chest pressure.  CHEST - 2 VIEW  Comparison: CT chest 10/30/2009 and PA and lateral chest 09/13/2007.  Findings: Minimal linear atelectasis or scarring is seen in the lung bases.  The lungs otherwise appear clear.  Heart size is normal.  No pneumothorax or pleural effusion.  IMPRESSION: No acute disease.   Original Report Authenticated By: Holley Dexter, M.D.      No diagnosis found.    MDM   Date: 03/03/2012  Rate: 75  Rhythm: normal sinus rhythm  QRS Axis: normal  Intervals: normal  ST/T Wave abnormalities: nonspecific ST changes  Conduction Disutrbances:none  Narrative Interpretation:   Old EKG Reviewed: changes noted      Case d/w cards fellow, admit to medicine    Persephone Schriever K Isreal Moline-Rasch, MD 03/03/12 [phone]

## 2012-03-03 NOTE — Progress Notes (Signed)
VASCULAR LAB PRELIMINARY  PRELIMINARY  PRELIMINARY  PRELIMINARY  Bilateral lower extremity venous duplex completed.    Preliminary report:  Bilateral:  No evidence of DVT, superficial thrombosis, or Baker's Cyst.   Lise Pincus, RVS 03/03/2012, 10:52 AM

## 2012-03-03 NOTE — ED Notes (Addendum)
PT. ARRIVED WITH EMS FROM HOME REPORTS PALPITATIONS " SKIPPING BEATS" ONSET THIS EVENING , DENIES CHEST PAIN , NO SOB . PT. TOOK 3 ADULT ASA PRIOR TO ARRIVAL.

## 2012-03-03 NOTE — Progress Notes (Signed)
UR Completed Ovid Witman Graves-Bigelow, RN,BSN [phone]  

## 2012-03-03 NOTE — ED Notes (Signed)
REPORT GIVEN TO 3 WEST UNIT NURSE , TRANSPORTED IN STABLE CONDITION , DENIES CHEST PAIN / NO SOB , IV SITE UNREMARKABLE.

## 2012-03-07 ENCOUNTER — Ambulatory Visit (INDEPENDENT_AMBULATORY_CARE_PROVIDER_SITE_OTHER): Payer: Medicare Other | Admitting: Internal Medicine

## 2012-03-07 VITALS — BP 138/72 | HR 56 | Temp 97.5°F | Wt 166.0 lb

## 2012-03-07 DIAGNOSIS — R079 Chest pain, unspecified: Secondary | ICD-10-CM

## 2012-03-07 DIAGNOSIS — E785 Hyperlipidemia, unspecified: Secondary | ICD-10-CM

## 2012-03-07 NOTE — Patient Instructions (Addendum)
Come back fasting: FLP, CBC, LFT---- DX palpitations, chest discomfort Come back in 4 months for a physical exam Call anytime if your symptoms resurface

## 2012-03-07 NOTE — Assessment & Plan Note (Signed)
Pravachol was increased from 20 mg to 40 mg at the hospital, check labs

## 2012-03-07 NOTE — Assessment & Plan Note (Addendum)
Recent episode of palpitations and ill-defined chest discomfort. Was admitted to the hospital (pt reports he had had EKG lead @ his chest--> I assume he was at a telemetry bed). Had a single troponin at the hospital that was negative. Echocardiogram showed no wall motion abnormalities. Went to cardiology to have a stress test yesterday , the patient canceled it  and likes to be referred to a [place]  office. Plan: Stress test at [facility] Call if symptoms resurface Labs  Continue imdur  for now Consider a Holter

## 2012-03-07 NOTE — Progress Notes (Signed)
  Subjective:    Patient ID: Brandon Burke, male    DOB: Jun 03, 1931, 78 y.o.   MRN: [id number]  HPI Hospital followup Was seen at the hospital 03/03/2012, admitted to telemetry (?) Per chart, he complained of palpitations, chest pain. Chest pain decreased with nitroglycerin per chart review CT angiogram show no pulmonary emboli, he did have significant calcium burden in the coronary arteries, they consulted cardiology, they eventually recommended an outpatient stress test. He was started on imdur Echocardiogram showed no wall motion abnormalities. Labs reviewed as well  Past Medical History: MVP Prostate cancer , released from urology in 2010,needs yearly PSAs with PCP Hyperlipidemia DVT, hx of 2001 (after prostate surgery) PE 5-09:was referred to hematology, and they recommend to discontinue Coumadin 5/11  Past Surgical History: Prostatectomy ( aprox. 2000) Cataract extraction  Family History: CAD-- father have heart disease on his mid 65s DM--no Strokes--no prostate ca--no colon cancer--no  Social History: Never Smoked Alcohol use-no Married, wife w/ melanoma, advanced   no children  Review of Systems Patient is here for followup, the way he described his symptoms is:  palpitations, "no actual chest pain" (dyscomfort, "something not right"), some left arm discomfort. Since he left the hospital he is asymptomatic He denies nausea, loss of consciousness. He did have some dry cough. His CBC show a slightly decreased hemoglobin was in the hospital, denies abdominal pain, GERD or blood in the stools.    Objective:   Physical Exam General -- alert, well-developed, and well-nourished.    Lungs -- normal respiratory effort, no intercostal retractions, no accessory muscle use, and normal breath sounds.   Heart-- normal rate, regular rhythm, no murmur, and no gallop.   Abdomen--soft, non-tender, no distention, no masses, no HSM, no guarding, and no rigidity.     Extremities-- no pretibial edema bilaterally Psych-- Cognition and judgment appear intact. Alert and cooperative with normal attention span and concentration.  not anxious appearing and not depressed appearing.       Assessment & Plan:

## 2012-03-08 ENCOUNTER — Encounter: Payer: Self-pay | Admitting: Internal Medicine

## 2012-03-08 ENCOUNTER — Telehealth: Payer: Self-pay | Admitting: Internal Medicine

## 2012-03-08 ENCOUNTER — Other Ambulatory Visit (INDEPENDENT_AMBULATORY_CARE_PROVIDER_SITE_OTHER): Payer: Medicare Other

## 2012-03-08 DIAGNOSIS — R002 Palpitations: Secondary | ICD-10-CM

## 2012-03-08 DIAGNOSIS — R0789 Other chest pain: Secondary | ICD-10-CM

## 2012-03-08 LAB — LIPID PANEL
Cholesterol: 140 mg/dL (ref 0–200)
HDL: 31.9 mg/dL — ABNORMAL LOW (ref >39.00)
Total CHOL/HDL Ratio: 4
VLDL: 44.2 mg/dL — ABNORMAL HIGH (ref 0.0–40.0)

## 2012-03-08 LAB — HEPATIC FUNCTION PANEL
AST: 23 U/L (ref 0–37)
Alkaline Phosphatase: 59 U/L (ref 39–117)
Bilirubin, Direct: 0.1 mg/dL (ref 0.0–0.3)

## 2012-03-08 LAB — CBC WITH DIFFERENTIAL/PLATELET
Basophils Absolute: 0 10*3/uL (ref 0.0–0.1)
Eosinophils Absolute: 0.1 10*3/uL (ref 0.0–0.7)
Lymphocytes Relative: 20 % (ref 12.0–46.0)
MCHC: 33.7 g/dL (ref 30.0–36.0)
Monocytes Relative: 9 % (ref 3.0–12.0)
Neutrophils Relative %: 67.6 % (ref 43.0–77.0)
RBC: 4.82 Mil/uL (ref 4.22–5.81)
RDW: 12.6 % (ref 11.5–14.6)

## 2012-03-08 NOTE — Telephone Encounter (Signed)
Please advise 

## 2012-03-08 NOTE — Telephone Encounter (Signed)
Go back to pravastatin 20 mg daily for one week, then try again pravastatin 40 mg daily. See if that has anything to do with headaches. If the headache is severe, needs to be seen or go to the ER

## 2012-03-08 NOTE — Telephone Encounter (Signed)
Discussed with pt

## 2012-03-08 NOTE — Telephone Encounter (Signed)
Patient calling, he has developed a h/a in the back of his head near the base of his skull the last two afternoons.  His Pravastatin was increased from 20mg  40mg  on Friday 11/1 pm.  He was seen in the office on Tuesday 11/5.  Was told that the medication could cause h/a's.   The headache has not been present in the morning when he awakens.   Triaged per Headache protocal.  Sent a message to the office.   Please advise if he can cut back on the Pravastatin.  His stress test is scheduled for 11/14.

## 2012-03-09 ENCOUNTER — Encounter: Payer: Self-pay | Admitting: Family Medicine

## 2012-03-09 ENCOUNTER — Ambulatory Visit (INDEPENDENT_AMBULATORY_CARE_PROVIDER_SITE_OTHER): Payer: Medicare Other | Admitting: Family Medicine

## 2012-03-09 ENCOUNTER — Telehealth: Payer: Self-pay | Admitting: Internal Medicine

## 2012-03-09 VITALS — BP 120/70 | HR 85 | Temp 97.8°F | Wt 166.0 lb

## 2012-03-09 DIAGNOSIS — R519 Headache, unspecified: Secondary | ICD-10-CM | POA: Insufficient documentation

## 2012-03-09 DIAGNOSIS — R42 Dizziness and giddiness: Secondary | ICD-10-CM

## 2012-03-09 DIAGNOSIS — R079 Chest pain, unspecified: Secondary | ICD-10-CM

## 2012-03-09 DIAGNOSIS — R51 Headache: Secondary | ICD-10-CM | POA: Insufficient documentation

## 2012-03-09 MED ORDER — NITROGLYCERIN 0.3 MG SL SUBL: SUBLINGUAL_TABLET | SUBLINGUAL | Status: DC

## 2012-03-09 NOTE — Assessment & Plan Note (Signed)
None since admission Stress test scheduled next week with Corinda Gubler

## 2012-03-09 NOTE — Telephone Encounter (Signed)
Noted, patient will see Dr.Lowne today at 4:00 pm. Message will be forwarded to Dr.Paz [facility]

## 2012-03-09 NOTE — Progress Notes (Signed)
  Subjective:    Patient ID: Brandon Burke, male    DOB: April 23, 1932, 78 y.o.   MRN: [id number]  HPIPt here to f/u hospital.  Pt was admitted for chest pain but has not had anymore cp since arriving in ER.   Pt was d/c on Imdur and inc dose pravachol.  Pt thought it was the pravachol.   He did not need any ntg in hospital per pt.  No other complaints.    Review of Systems As above    Objective:   Physical Exam  Nursing note and vitals reviewed. Constitutional: He is oriented to person, place, and time. He appears well-developed and well-nourished.  HENT:  Right Ear: External ear normal.  Left Ear: External ear normal.  Nose: Nose normal.  Eyes: EOM are normal. Right eye exhibits no discharge. Left eye exhibits no discharge.  Neck: Normal range of motion. Neck supple. No thyromegaly present.  Cardiovascular: Normal rate and regular rhythm.   Pulmonary/Chest: Effort normal and breath sounds normal.  Musculoskeletal: He exhibits no edema and no tenderness.  Neurological: He is alert and oriented to person, place, and time.  Psychiatric: He has a normal mood and affect. His behavior is normal. Judgment and thought content normal.          Assessment & Plan:

## 2012-03-09 NOTE — Telephone Encounter (Signed)
Caller: Tim/Patient; Patient Name: Brandon Burke; PCP: Willow Ora; Best Callback Phone Number: [phone]; Reason for call: 03/09/12 Headache behind right ear with dizziness 8/10 comes and goes with sharp pain. Elevation of pain when turning neck.  Took 1 500mg  Tylenol at 0900 with no improvement and headache is worse. Urgent symptom of "New onset of severe headache unrelieved with 4 hours of home care" per Headache protocol.  Home care advice given.  Appointment scheduled 03/09/12 at 16:00 with Dr. Laury Axon "Doc of the afternoon".

## 2012-03-09 NOTE — Patient Instructions (Signed)
Chest Pain (Nonspecific) It is often hard to give a specific diagnosis for the cause of chest pain. There is always a chance that your pain could be related to something serious, such as a heart attack or a blood clot in the lungs. You need to follow up with your caregiver for further evaluation. CAUSES   Heartburn.  Pneumonia or bronchitis.  Anxiety or stress.  Inflammation around your heart (pericarditis) or lung (pleuritis or pleurisy).  A blood clot in the lung.  A collapsed lung (pneumothorax). It can develop suddenly on its own (spontaneous pneumothorax) or from injury (trauma) to the chest.  Shingles infection (herpes zoster virus). The chest wall is composed of bones, muscles, and cartilage. Any of these can be the source of the pain.  The bones can be bruised by injury.  The muscles or cartilage can be strained by coughing or overwork.  The cartilage can be affected by inflammation and become sore (costochondritis). DIAGNOSIS  Lab tests or other studies, such as X-rays, electrocardiography, stress testing, or cardiac imaging, may be needed to find the cause of your pain.  TREATMENT   Treatment depends on what may be causing your chest pain. Treatment may include:  Acid blockers for heartburn.  Anti-inflammatory medicine.  Pain medicine for inflammatory conditions.  Antibiotics if an infection is present.  You may be advised to change lifestyle habits. This includes stopping smoking and avoiding alcohol, caffeine, and chocolate.  You may be advised to keep your head raised (elevated) when sleeping. This reduces the chance of acid going backward from your stomach into your esophagus.  Most of the time, nonspecific chest pain will improve within 2 to 3 days with rest and mild pain medicine. HOME CARE INSTRUCTIONS   If antibiotics were prescribed, take your antibiotics as directed. Finish them even if you start to feel better.  For the next few days, avoid physical  activities that bring on chest pain. Continue physical activities as directed.  Do not smoke.  Avoid drinking alcohol.  Only take over-the-counter or prescription medicine for pain, discomfort, or fever as directed by your caregiver.  Follow your caregiver's suggestions for further testing if your chest pain does not go away.  Keep any follow-up appointments you made. If you do not go to an appointment, you could develop lasting (chronic) problems with pain. If there is any problem keeping an appointment, you must call to reschedule. SEEK MEDICAL CARE IF:   You think you are having problems from the medicine you are taking. Read your medicine instructions carefully.  Your chest pain does not go away, even after treatment.  You develop a rash with blisters on your chest. SEEK IMMEDIATE MEDICAL CARE IF:   You have increased chest pain or pain that spreads to your arm, neck, jaw, back, or abdomen.  You develop shortness of breath, an increasing cough, or you are coughing up blood.  You have severe back or abdominal pain, feel nauseous, or vomit.  You develop severe weakness, fainting, or chills.  You have a fever. THIS IS AN EMERGENCY. Do not wait to see if the pain will go away. Get medical help at once. Call your local emergency services (911 in [place] Do not drive yourself to the hospital. MAKE SURE YOU:   Understand these instructions.  Will watch your condition.  Will get help right away if you are not doing well or get worse. Document Released: 01/27/2005 Document Revised: 07/12/2011 Document Reviewed: 11/23/2007 ExitCare Patient Information 2013 [employer].  

## 2012-03-09 NOTE — Assessment & Plan Note (Signed)
Prob from Imdur---  Stop and switch to NTG Sl as needed Pt to have stress test next week If symptoms do no improve or worsen rto or go to ER Again --- pt has had no chest pain since the ER and was not given ntg during hospital stay

## 2012-03-16 ENCOUNTER — Ambulatory Visit (HOSPITAL_COMMUNITY): Payer: Medicare Other | Attending: Cardiovascular Disease | Admitting: Radiology

## 2012-03-16 VITALS — BP 151/93 | Ht 72.0 in | Wt 160.0 lb

## 2012-03-16 DIAGNOSIS — R0609 Other forms of dyspnea: Secondary | ICD-10-CM | POA: Insufficient documentation

## 2012-03-16 DIAGNOSIS — R0602 Shortness of breath: Secondary | ICD-10-CM

## 2012-03-16 DIAGNOSIS — R079 Chest pain, unspecified: Secondary | ICD-10-CM

## 2012-03-16 DIAGNOSIS — R002 Palpitations: Secondary | ICD-10-CM | POA: Insufficient documentation

## 2012-03-16 DIAGNOSIS — R42 Dizziness and giddiness: Secondary | ICD-10-CM | POA: Insufficient documentation

## 2012-03-16 DIAGNOSIS — R0989 Other specified symptoms and signs involving the circulatory and respiratory systems: Secondary | ICD-10-CM | POA: Insufficient documentation

## 2012-03-16 MED ORDER — TECHNETIUM TC 99M SESTAMIBI GENERIC - CARDIOLITE
11.0000 | Freq: Once | INTRAVENOUS | Status: AC | PRN
  Administered 2012-03-16: 11 via INTRAVENOUS

## 2012-03-16 MED ORDER — REGADENOSON 0.4 MG/5ML IV SOLN
0.4000 mg | Freq: Once | INTRAVENOUS | Status: AC
  Administered 2012-03-16: 0.4 mg via INTRAVENOUS

## 2012-03-16 MED ORDER — TECHNETIUM TC 99M SESTAMIBI GENERIC - CARDIOLITE
33.0000 | Freq: Once | INTRAVENOUS | Status: AC | PRN
  Administered 2012-03-16: 33 via INTRAVENOUS

## 2012-03-16 NOTE — Progress Notes (Signed)
[facility] SITE 3 NUCLEAR MED [address] [id number] [place] [address] [phone]  Cardiology Nuclear Med Study  SOU NOHR is a 78 y.o. male     MRN : [id number]     DOB: 20-Feb-1932  Procedure Date: 03/16/2012  Nuclear Med Background Indication for Stress Test:  Evaluation for Ischemia and ED visit for CP (-) enzymes History:  03/03/12 ECHO-EF:60%;?2004 Myocardial Perfusion Study;MVP Cardiac Risk Factors: Lipids  Symptoms:  Chest Pain, Dizziness, DOE, Fatigue with Exertion and Palpitations   Nuclear Pre-Procedure Caffeine/Decaff Intake:  None NPO After: 6:30am   Lungs:  clear O2 Sat: 98% on room air. IV 0.9% NS with Angio Cath:  22g  IV Site: L Forearm  IV Started by:  Milana Na, EMT-P  Chest Size (in):  42 Cup Size: n/a  Height: 6' (1.829 m)  Weight:  160 lb (72.576 kg)  BMI:  Body mass index is 21.70 kg/(m^2). Tech Comments:  Rx this am    Nuclear Med Study 1 or 2 day study: 1 day  Stress Test Type:  Treadmill/Lexiscan  Reading MD: Kristeen Miss, MD  Order Authorizing Provider:  J.Paz MD  Resting Radionuclide: Technetium 46m Sestamibi  Resting Radionuclide Dose: 11.0 mCi   Stress Radionuclide:  Technetium 66m Sestamibi  Stress Radionuclide Dose: 33.0 mCi           Stress Protocol Rest HR: 70 Stress HR: 95  Rest BP: 151/93 Stress BP: 161/87  Exercise Time (min): 2:00 METS: n/a   Predicted Max HR: 142 bpm % Max HR: 66.9 bpm Rate Pressure Product: 62952   Dose of Adenosine (mg):  n/a Dose of Lexiscan: 0.4 mg  Dose of Atropine (mg): n/a Dose of Dobutamine: n/a mcg/kg/min (at max HR)  Stress Test Technologist: Frederick Peers, EMT-P  Nuclear Technologist:  Domenic Polite, CNMT     Rest Procedure:  Myocardial perfusion imaging was performed at rest 45 minutes following the intravenous administration of Technetium 45m Sestamibi Rest ECG: NSR - Normal EKG  Stress Procedure:  The patient received IV Lexiscan 0.4 mg over  15-seconds with concurrent low level exercise and then Technetium 79m Sestamibi was injected at 30-seconds while the patient continued walking one more minute. There were no significant changes with Lexiscan. Quantitative spect images were obtained after a 45-minute delay. Stress ECG: No significant change from baseline ECG  QPS Raw Data Images:  Normal; no motion artifact; normal heart/lung ratio. Stress Images:  Normal homogeneous uptake in all areas of the myocardium. Rest Images:  Normal homogeneous uptake in all areas of the myocardium. Subtraction (SDS):  No evidence of ischemia. Transient Ischemic Dilatation (Normal <1.22):  0.91 Lung/Heart Ratio (Normal <0.45):  0.27  Quantitative Gated Spect Images QGS EDV:  73 ml QGS ESV:  25 ml  Impression Exercise Capacity:  Lexiscan with no exercise. BP Response:  Normal blood pressure response. Clinical Symptoms:  No significant symptoms noted. ECG Impression:  No significant ST segment change suggestive of ischemia. Comparison with Prior Nuclear Study: No images to compare  Overall Impression:  Normal stress nuclear study.  No evidence of ischemia.    LV Ejection Fraction: 66%.  LV Wall Motion:  NL LV Function; NL Wall Motion.    Vesta Mixer, Montez Hageman., MD, [facility] 03/16/2012, 3:11 PM Office - [phone] Pager [phone]

## 2012-05-09 ENCOUNTER — Other Ambulatory Visit: Payer: Self-pay

## 2012-05-09 MED ORDER — PRAVASTATIN SODIUM 40 MG PO TABS: 40.0000 mg | ORAL_TABLET | Freq: Every day | ORAL | Status: DC

## 2012-05-09 NOTE — Telephone Encounter (Signed)
Spoke to pt's wife & she state that we needed to send in pravastatin 40mg  1 tab daily to [facility].  Refill done.

## 2012-05-09 NOTE — Telephone Encounter (Signed)
Msg from the patient requesting a refill on Pravastatin 40 mg be sent to [facility]. His dose was increased but we are still calling in the 20 mg pill and he would like a call back if you have questions.

## 2012-06-17 ENCOUNTER — Other Ambulatory Visit: Payer: Self-pay

## 2012-07-06 ENCOUNTER — Encounter: Payer: Self-pay | Admitting: Internal Medicine

## 2012-07-06 ENCOUNTER — Ambulatory Visit (INDEPENDENT_AMBULATORY_CARE_PROVIDER_SITE_OTHER): Payer: Medicare Other | Admitting: Internal Medicine

## 2012-07-06 VITALS — BP 130/82 | HR 58 | Ht 72.5 in | Wt 167.0 lb

## 2012-07-06 DIAGNOSIS — Z8546 Personal history of malignant neoplasm of prostate: Secondary | ICD-10-CM

## 2012-07-06 DIAGNOSIS — E785 Hyperlipidemia, unspecified: Secondary | ICD-10-CM

## 2012-07-06 DIAGNOSIS — Z0000 Encounter for general adult medical examination without abnormal findings: Secondary | ICD-10-CM

## 2012-07-06 DIAGNOSIS — R079 Chest pain, unspecified: Secondary | ICD-10-CM

## 2012-07-06 DIAGNOSIS — M1990 Unspecified osteoarthritis, unspecified site: Secondary | ICD-10-CM

## 2012-07-06 LAB — LIPID PANEL
Cholesterol: 140 mg/dL (ref 0–200)
HDL: 36.4 mg/dL — ABNORMAL LOW (ref >39.00)
LDL Cholesterol: 72 mg/dL (ref 0–99)
Triglycerides: 159 mg/dL — ABNORMAL HIGH (ref 0.0–149.0)
VLDL: 31.8 mg/dL (ref 0.0–40.0)

## 2012-07-06 LAB — AST: AST: 26 U/L (ref 0–37)

## 2012-07-06 MED ORDER — ATENOLOL 50 MG PO TABS: 50.0000 mg | ORAL_TABLET | Freq: Every day | ORAL | Status: DC

## 2012-07-06 MED ORDER — PRAVASTATIN SODIUM 40 MG PO TABS: 40.0000 mg | ORAL_TABLET | Freq: Every day | ORAL | Status: DC

## 2012-07-06 NOTE — Assessment & Plan Note (Signed)
On Pravachol 40 mg, check labs

## 2012-07-06 NOTE — Patient Instructions (Signed)

## 2012-07-06 NOTE — Progress Notes (Signed)
  Subjective:    Patient ID: Brandon Burke, male    DOB: Mar 23, 1932, 79 y.o.   MRN: [id number]  HPI Here for Medicare AWV:  1. Risk factors based on Past M, S, F history: reviewed  2. Physical Activities: active at home, stationary bike, takes walks weather permit  3. Depression/mood: wife w/ cancer, no depression  4. Hearing: some difficult on the L, not worse, declined a referral to  audiology 5. ADL's: independent , still drive  6. Fall Risk: no recent falls, pt being very careful, see instructions  7. Home Safety: does feel safe at home  8. Height, weight, &visual acuity: see VS, sees eye doctor routinely  9. Counseling: yes  10. Labs ordered based on risk factors: yes  11. Referral Coordination, if needed  12. Care Plan, see a/p  13. Cognitive Assessment : cognition, memory and motor skill seem appropiate   in addition, we discussed the following High cholesterol, good compliance with medications, no apparent distress. Chest pain, no further symptoms, stress test was negative. DJD, uses Flexeril from time to time for back spasms.  Past Medical History: MVP Prostate cancer , released from urology in 2010,needs yearly PSAs with PCP Hyperlipidemia DVT, hx of 2001 (after prostate surgery) PE 5-09:was referred to hematology, and they recommend to discontinue Coumadin 5/11  Past Surgical History: Prostatectomy ( aprox. 2000) Cataract extraction  Family History: CAD-- father have heart disease on his mid 81s DM--no Strokes--no prostate ca--no colon cancer--no  Social History: Never Smoked Alcohol use-no Married, wife w/ melanoma, advanced   no children    Review of Systems Denies nausea, vomiting, diarrhea or blood in the stools. No dysuria, gross hematuria, difficulty urinating or difficulty controlling his bowels. Sleeping well most nights, from time to time needs a OTC sleeping aid.     Objective:   Physical Exam BP 130/82  Pulse 58  Ht 6' 0.5" (1.842  m)  Wt 167 lb (75.751 kg)  BMI 22.33 kg/m2  SpO2 99%  General -- alert, well-developed  Neck --no thyromegaly , normal carotid pulse Lungs -- normal respiratory effort, no intercostal retractions, no accessory muscle use, and normal breath sounds.   Heart-- normal rate, regular rhythm, no murmur, and no gallop.   Abdomen--soft, non-tender, no distention, no masses, no HSM, no guarding, and no rigidity.   Extremities-- no pretibial edema bilaterally Neurologic-- alert & oriented X3 and strength normal in all extremities. Psych-- Cognition and judgment appear intact. Alert and cooperative with normal attention span and concentration.  not anxious appearing and not depressed appearing.      Assessment & Plan:

## 2012-07-06 NOTE — Assessment & Plan Note (Addendum)
Td 2002, 2013 pneumonia shot  ---- got his second shot in 2006 shingles shot: had already cscope 01-2005, had tics, next 2016 status post prostatectomy for prostate cancer around 2000, discharge from urology  2010, Rx  yearly PSAs ---checking today counseled:   continue with his healthy life style!

## 2012-07-06 NOTE — Assessment & Plan Note (Signed)
Doing well, uses Flexeril from time to time for muscle spasms in the back.

## 2012-07-06 NOTE — Assessment & Plan Note (Signed)
Followup with urology is when necessary, check a PSA yearly.

## 2012-07-06 NOTE — Assessment & Plan Note (Addendum)
No further symptoms, stress test  (-) 03-2012

## 2013-01-09 ENCOUNTER — Ambulatory Visit (INDEPENDENT_AMBULATORY_CARE_PROVIDER_SITE_OTHER): Payer: Medicare Other | Admitting: Internal Medicine

## 2013-01-09 ENCOUNTER — Encounter: Payer: Self-pay | Admitting: Internal Medicine

## 2013-01-09 VITALS — BP 156/83 | HR 60 | Temp 97.7°F | Wt 166.0 lb

## 2013-01-09 DIAGNOSIS — G4700 Insomnia, unspecified: Secondary | ICD-10-CM

## 2013-01-09 DIAGNOSIS — R5383 Other fatigue: Secondary | ICD-10-CM | POA: Insufficient documentation

## 2013-01-09 DIAGNOSIS — R5381 Other malaise: Secondary | ICD-10-CM

## 2013-01-09 DIAGNOSIS — Z86711 Personal history of pulmonary embolism: Secondary | ICD-10-CM

## 2013-01-09 LAB — CBC WITH DIFFERENTIAL/PLATELET
Basophils Absolute: 0.1 10*3/uL (ref 0.0–0.1)
Eosinophils Absolute: 0.1 10*3/uL (ref 0.0–0.7)
Hemoglobin: 15.7 g/dL (ref 13.0–17.0)
Lymphocytes Relative: 19.3 % (ref 12.0–46.0)
MCHC: 34.5 g/dL (ref 30.0–36.0)
Neutro Abs: 3.3 10*3/uL (ref 1.4–7.7)
Platelets: 149 10*3/uL — ABNORMAL LOW (ref 150.0–400.0)
RDW: 12.6 % (ref 11.5–14.6)

## 2013-01-09 LAB — FOLATE: Folate: >24.8 ng/mL (ref >5.9)

## 2013-01-09 LAB — BASIC METABOLIC PANEL
BUN: 16 mg/dL (ref 6–23)
CO2: 27 mEq/L (ref 19–32)
Calcium: 9.9 mg/dL (ref 8.4–10.5)
Creatinine, Ser: 1.1 mg/dL (ref 0.4–1.5)
Glucose, Bld: 89 mg/dL (ref 70–99)
Sodium: 142 mEq/L (ref 135–145)

## 2013-01-09 NOTE — Assessment & Plan Note (Signed)
Occasional difficulty falling back to sleep ; recommend observation for now

## 2013-01-09 NOTE — Progress Notes (Signed)
  Subjective:    Patient ID: Brandon Burke, male    DOB: June 01, 1931, 79 y.o.   MRN: [id number]  HPI 6 month f/u, has several concerns: 3 months h/o lack of energy, Denies dyspnea on exertion, depression or anxiety, " simply less energy". Admits that in the daytime he feels more sleepy, usually takes one nap, at night he is able to fall asleep ok but is hard to go back to sleep if he has to go to the bathroom. Every 4 or 5 weeks gets very sweaty at night. Denies recent weight loss, actual fever or chills. Good medication compliance except for aspirin, due to excessive bleeding (from skin, no hematuria or blood per rectum) is decreasing the dose of aspirin a little    Past Medical History: MVP Prostate cancer , released from urology in 2010,needs yearly PSAs with PCP Hyperlipidemia DVT, hx of 2001 (after prostate surgery) PE 5-09:was referred to hematology, and they recommend to discontinue Coumadin 5/11  Past Surgical History: Prostatectomy ( aprox. 2000) Cataract extraction  Family History: CAD-- father have heart disease on his mid 79s DM--no Strokes--no prostate ca--no colon cancer--no  Social History: Never Smoked Alcohol use-no Married, wife w/ melanoma, advanced   no children    Review of Systems No LE edema. No snoring. No nausea, vomiting, diarrhea. No blood per rectum No cough-wheezing No HAs or new arthralgias  No problems with memory. No anxiety.     Objective:   Physical Exam BP 156/83  Pulse 60  Temp(Src) 97.7 F (36.5 C)  Wt 166 lb (75.297 kg)  BMI 22.19 kg/m2  SpO2 98% General -- alert, well-developed, NAD. Healthy appearing 79 year old gentleman Neck --no thyromegaly. HEENT-- Not pale.   Lungs -- normal respiratory effort, no intercostal retractions, no accessory muscle use, and normal breath sounds.  Heart-- normal rate, regular rhythm, no murmur.  Abdomen-- Not distended, good bowel sounds,soft, non-tender. No mass,organomegaly.No  rebound or rigidity.  Extremities-- no pretibial edema bilaterally  Neurologic-- alert & oriented X3. Speech, gait normal. Strength normal in all extremities.  Psych-- Cognition and judgment appear intact. Alert and cooperative with normal attention span and concentration. not anxious appearing and not depressed appearing.       Assessment & Plan:

## 2013-01-09 NOTE — Patient Instructions (Signed)
Get your blood work before you leave  Next visit in  3 months for a routine office visit Please make an appointment before you leave the office today (or call few weeks in advance) Call anytime if symptoms increase

## 2013-01-09 NOTE — Assessment & Plan Note (Addendum)
3 months history of fatigue. No cardiopulmonary symptoms. He is also getting more sleepy during the daytime, denies snoring.  Sx are age-related? Early dementia? Plan: CBC, BMP, B12, folic acid, vit d  sedimentation rate.

## 2013-01-09 NOTE — Assessment & Plan Note (Signed)
Reduce  aspirin from 325 to162 mg daily due to to excessive bleeding

## 2013-01-10 LAB — VITAMIN D 25 HYDROXY (VIT D DEFICIENCY, FRACTURES): Vit D, 25-Hydroxy: 44 ng/mL (ref 30–89)

## 2013-02-01 ENCOUNTER — Encounter: Payer: Self-pay | Admitting: Internal Medicine

## 2013-04-12 ENCOUNTER — Ambulatory Visit (INDEPENDENT_AMBULATORY_CARE_PROVIDER_SITE_OTHER): Payer: Medicare Other | Admitting: Internal Medicine

## 2013-04-12 ENCOUNTER — Encounter: Payer: Self-pay | Admitting: Internal Medicine

## 2013-04-12 VITALS — BP 131/85 | HR 61 | Temp 98.3°F | Wt 166.0 lb

## 2013-04-12 DIAGNOSIS — R5381 Other malaise: Secondary | ICD-10-CM

## 2013-04-12 DIAGNOSIS — G4700 Insomnia, unspecified: Secondary | ICD-10-CM

## 2013-04-12 NOTE — Patient Instructions (Signed)
  Next visit for a physical exam  fasting, by March 2015 Please make an appointment

## 2013-04-12 NOTE — Progress Notes (Signed)
   Subjective:    Patient ID: Brandon Burke, male    DOB: [birth year], 79 y.o.   MRN: [id number]  HPI Followup from previous visit. He complained of fatigue and difficulty sleeping, since the last visit he is doing great, symptoms resolve. He is taking Tylenol PM at night and that helps. On looking back he thinks he was not exercising enough and had some stress, now he is more active and again feels well. Denies snoring or lower extremity edema  Past Medical History  Diagnosis Date  . Mitral valve prolapse   . PE (pulmonary embolism)     5-09:was referred to hematology, and they recommend to discontinue Coumadin 5/11  . DVT (deep venous thrombosis)     hx of 2001 (after prostate surgery)  . Hyperlipidemia   . Prostate cancer      released from urology in 2010,needs yearly PSAs with PCP   Past Surgical History  Procedure Laterality Date  . Prostatectomy  2000   History   Social History  . Marital Status: Married    Spouse Name: N/A    Number of Children: 0  . Years of Education: N/A   Occupational History  . Retired    Social History Main Topics  . Smoking status: Never Smoker   . Smokeless tobacco: Never Used  . Alcohol Use: No  . Drug Use: No  . Sexual Activity: Not on file   Other Topics Concern  . Not on file   Social History Narrative   Married, wife w/ melanoma, advanced     no children      Family History: CAD-- father have heart disease on his mid 34s DM--no Strokes--no prostate ca--no colon cancer--no     Review of Systems     Objective:   Physical Exam BP 131/85  Pulse 61  Temp(Src) 98.3 F (36.8 C)  Wt 166 lb (75.297 kg)  SpO2 100% General -- alert, well-developed, NAD.   Lungs -- normal respiratory effort, no intercostal retractions, no accessory muscle use, and normal breath sounds.  Heart-- normal rate, regular rhythm, no murmur.  Extremities-- no pretibial edema bilaterally  Neurologic--  alert & oriented X3.  Psych--  Cognition and judgment appear intact. Cooperative with normal attention span and concentration. No anxious appearing , no depressed appearing.      Assessment & Plan:  Fatigue, insomnia: Labs were unremarkable, symptoms resolve, continue with Tylenol PM. Next visit March 2015.

## 2013-04-12 NOTE — Progress Notes (Signed)
Pre visit review using our clinic review tool, if applicable. No additional management support is needed unless otherwise documented below in the visit note. 

## 2013-05-11 ENCOUNTER — Other Ambulatory Visit: Payer: Self-pay | Admitting: Internal Medicine

## 2013-07-03 ENCOUNTER — Encounter: Payer: Medicare Other | Admitting: Internal Medicine

## 2013-07-19 ENCOUNTER — Encounter: Payer: Medicare Other | Admitting: Internal Medicine

## 2013-08-10 ENCOUNTER — Telehealth: Payer: Self-pay

## 2013-08-10 NOTE — Telephone Encounter (Deleted)
Pre visit review using our clinic review tool, if applicable. No additional management support is needed unless otherwise documented below in the visit note. 

## 2013-08-10 NOTE — Telephone Encounter (Signed)
Medication List and allergies:  Reviewed and updated  90 day supply/mail order: [facility] Local prescriptions:   Immunizations due:   A/P:   No changes to FH, PSH or Personal Hx Flu vaccine--01/2013 Tdap--06/2011 PNA--03/2005 Shingles--06/2005 CCS--12/2004--Dr Gessner--nml--next 2016 PSA--06/2011--0.01  To Discuss with Provider: Not at this time

## 2013-08-13 ENCOUNTER — Ambulatory Visit (INDEPENDENT_AMBULATORY_CARE_PROVIDER_SITE_OTHER): Payer: Medicare Other | Admitting: Internal Medicine

## 2013-08-13 ENCOUNTER — Encounter: Payer: Self-pay | Admitting: Internal Medicine

## 2013-08-13 VITALS — BP 124/74 | HR 70 | Temp 98.0°F | Ht 72.0 in | Wt 166.0 lb

## 2013-08-13 DIAGNOSIS — Z0000 Encounter for general adult medical examination without abnormal findings: Secondary | ICD-10-CM

## 2013-08-13 DIAGNOSIS — M1990 Unspecified osteoarthritis, unspecified site: Secondary | ICD-10-CM

## 2013-08-13 DIAGNOSIS — E785 Hyperlipidemia, unspecified: Secondary | ICD-10-CM

## 2013-08-13 NOTE — Assessment & Plan Note (Addendum)
Td 2002, 2013 pneumonia shot  ---- got his second shot in 2006 prevnar recommended shingles shot: had already cscope 01-2005, had tics, next 2016 status post prostatectomy for prostate cancer around 2000, discharge from urology  2010, Rx  yearly PSAs ---checking today Sees dermatology from time to time counseled:   continue with his healthy life style

## 2013-08-13 NOTE — Assessment & Plan Note (Addendum)
Developed back pain today, has that problem from time to time, recommend to call me if he is not improving soon, to take Tylenol as needed

## 2013-08-13 NOTE — Patient Instructions (Signed)
Schedule a lab appointment, fasting: PSA-- prostate cancer FLP AS ALT-- hyperlipidemia      Next visit is for routine check up regards your cholesterol in 6 months  No need to come back fasting Please make an appointment    Think about getting a immunization called [place] cause injuries and can affect all age groups. It is possible to use preventive measures to significantly decrease the likelihood of falls. There are many simple measures which can make your home safer and prevent falls. OUTDOORS  Repair cracks and edges of walkways and driveways.  Remove high doorway thresholds.  Trim shrubbery on the main path into your home.  Have good outside lighting.  Clear walkways of tools, rocks, debris, and clutter.  Check that handrails are not broken and are securely fastened. Both sides of steps should have handrails.  Have leaves, snow, and ice cleared regularly.  Use sand or salt on walkways during winter months.  In the garage, clean up grease or oil spills. BATHROOM  Install night lights.  Install grab bars by the toilet and in the tub and shower.  Use non-skid mats or decals in the tub or shower.  Place a plastic non-slip stool in the shower to sit on, if needed.  Keep floors dry and clean up all water on the floor immediately.  Remove soap buildup in the tub or shower on a regular basis.  Secure bath mats with non-slip, double-sided rug tape.  Remove throw rugs and tripping hazards from the floors. BEDROOMS  Install night lights.  Make sure a bedside light is easy to reach.  Do not use oversized bedding.  Keep a telephone by your bedside.  Have a firm chair with side arms to use for getting dressed.  Remove throw rugs and tripping hazards from the floor. KITCHEN  Keep handles on pots and pans turned toward the center of the stove. Use back burners when possible.  Clean up spills quickly and allow time for  drying.  Avoid walking on wet floors.  Avoid hot utensils and knives.  Position shelves so they are not too high or low.  Place commonly used objects within easy reach.  If necessary, use a sturdy step stool with a grab bar when reaching.  Keep electrical cables out of the way.  Do not use floor polish or wax that makes floors slippery. If you must use wax, use non-skid floor wax.  Remove throw rugs and tripping hazards from the floor. STAIRWAYS  Never leave objects on stairs.  Place handrails on both sides of stairways and use them. Fix any loose handrails. Make sure handrails on both sides of the stairways are as long as the stairs.  Check carpeting to make sure it is firmly attached along stairs. Make repairs to worn or loose carpet promptly.  Avoid placing throw rugs at the top or bottom of stairways, or properly secure the rug with carpet tape to prevent slippage. Get rid of throw rugs, if possible.  Have an electrician put in a light switch at the top and bottom of the stairs. OTHER FALL PREVENTION TIPS  Wear low-heel or rubber-soled shoes that are supportive and fit well. Wear closed toe shoes.  When using a stepladder, make sure it is fully opened and both spreaders are firmly locked. Do not climb a closed stepladder.  Add color or contrast paint or tape to grab bars and handrails in your home. Place contrasting color strips  on first and last steps.  Learn and use mobility aids as needed. Install an electrical emergency response system.  Turn on lights to avoid dark areas. Replace light bulbs that burn out immediately. Get light switches that glow.  Arrange furniture to create clear pathways. Keep furniture in the same place.  Firmly attach carpet with non-skid or double-sided tape.  Eliminate uneven floor surfaces.  Select a carpet pattern that does not visually hide the edge of steps.  Be aware of all pets. OTHER HOME SAFETY TIPS  Set the water temperature  for 120 F (48.8 C).  Keep emergency numbers on or near the telephone.  Keep smoke detectors on every level of the home and near sleeping areas. Document Released: 04/09/2002 Document Revised: 10/19/2011 Document Reviewed: 07/09/2011 [facility] Patient Information 2014 [place].

## 2013-08-13 NOTE — Assessment & Plan Note (Signed)
Good compliance with medications, labs 

## 2013-08-13 NOTE — Progress Notes (Signed)
Subjective:    Patient ID: Brandon Burke, male    DOB: 07/15/1931, 80 y.o.   MRN: [id number]  DOS:  08/13/2013 Type of  Visit:  Here for Medicare AWV:  1. Risk factors based on Past M, S, F history: reviewed  2. Physical Activities: active at home, stationary bike, takes walks weather permit  3. Depression/mood: wife w/ cancer, neg depression  screening 4. Hearing: some difficult on the L, pt will let me know if needs a referral to audiology  5. ADL's: independent , still drive  6. Fall Risk: +risk for  falls, pt being very careful, see instructions  7. Home Safety: does feel safe at home  8. Height, weight, &visual acuity: see VS, sees eye doctor routinely  9. Counseling: yes  10. Labs ordered based on risk factors: yes  11. Referral Coordination, if needed  12. Care Plan, see a/p  13. Cognitive Assessment : cognition, memory and motor skill seem appropiate   in addition, we discussed the following Hyperlipidemia, good medication compliance, due for labs MVP, on atenolol, asymptomatic. Developed back pain this morning, is the first episode in a long time, located in the lower back bilaterally , no radiation, and no LE paresthesias.   ROS No  CP, SOB No palpitations, no lower extremity edema Denies  nausea, vomiting diarrhea No abdominal pain Denies  blood in the stools (-) cough, sputum production (-) wheezing, chest congestion No dysuria, gross hematuria, difficulty urinating  No headaches Denies dizziness    Past Medical History  Diagnosis Date  . Mitral valve prolapse   . PE (pulmonary embolism)     5-09:was referred to hematology, and they recommend to discontinue Coumadin 5/11  . DVT (deep venous thrombosis)     hx of 2001 (after prostate surgery)  . Hyperlipidemia   . Prostate cancer      released from urology in 2010,needs yearly PSAs with PCP    Past Surgical History  Procedure Laterality Date  . Prostatectomy  2000    History   Social  History  . Marital Status: Married    Spouse Name: N/A    Number of Children: 0  . Years of Education: N/A   Occupational History  . Retired    Social History Main Topics  . Smoking status: Never Smoker   . Smokeless tobacco: Never Used  . Alcohol Use: No  . Drug Use: No  . Sexual Activity: Not on file   Other Topics Concern  . Not on file   Social History Narrative   Married, wife w/ melanoma, advanced     no children      Family History  Problem Relation Age of Onset  . CAD Father 61  . Diabetes Neg Hx   . Colon cancer Neg Hx   . Prostate cancer Neg Hx        Medication List       This list is accurate as of: 08/13/13  5:36 PM.  Always use your most recent med list.               aspirin 81 MG tablet  Take 162 mg by mouth daily.     atenolol 50 MG tablet  Commonly known as:  TENORMIN  Take 1 tablet (50 mg total) by mouth daily.     cyclobenzaprine 5 MG tablet  Commonly known as:  FLEXERIL  TAKE ONE TABLET BY MOUTH TWICE DAILY AS NEEDED     folic acid  400 MCG tablet  Commonly known as:  FOLVITE  Take 400 mcg by mouth daily.     multivitamin,tx-minerals tablet  Take 1 tablet by mouth daily.     pravastatin 40 MG tablet  Commonly known as:  PRAVACHOL  Take 1 tablet (40 mg total) by mouth daily.           Objective:   Physical Exam BP 124/74  Pulse 70  Temp(Src) 98 F (36.7 C)  Ht 6' (1.829 m)  Wt 166 lb (75.297 kg)  BMI 22.51 kg/m2  SpO2 100% General -- alert, well-developed, NAD.  Neck --no thyromegaly  HEENT-- Not pale.  Lungs -- normal respiratory effort, no intercostal retractions, no accessory muscle use, and normal breath sounds.  Heart-- normal rate, regular rhythm, no murmur.  Abdomen-- Not distended, good bowel sounds,soft, non-tender.  Back-- no TTP @  L spine Extremities-- no pretibial edema bilaterally  Neurologic--  alert & oriented X3. Speech normal, gait with some difficulty, uses a cane. Antalgic posture noted. DTRs  symmetric  Psych-- Cognition and judgment appear intact. Cooperative with normal attention span and concentration. No anxious or depressed appearing.     Assessment & Plan:

## 2013-08-14 ENCOUNTER — Other Ambulatory Visit: Payer: Medicare Other

## 2013-08-14 ENCOUNTER — Other Ambulatory Visit: Payer: Self-pay | Admitting: Internal Medicine

## 2013-08-20 ENCOUNTER — Other Ambulatory Visit: Payer: Medicare Other

## 2013-08-21 ENCOUNTER — Other Ambulatory Visit (INDEPENDENT_AMBULATORY_CARE_PROVIDER_SITE_OTHER): Payer: Medicare Other

## 2013-08-21 DIAGNOSIS — E785 Hyperlipidemia, unspecified: Secondary | ICD-10-CM

## 2013-08-21 DIAGNOSIS — C61 Malignant neoplasm of prostate: Secondary | ICD-10-CM

## 2013-08-21 LAB — LIPID PANEL
CHOLESTEROL: 137 mg/dL (ref 0–200)
HDL: 32.2 mg/dL — ABNORMAL LOW (ref >39.00)
LDL CALC: 61 mg/dL (ref 0–99)
TRIGLYCERIDES: 219 mg/dL — AB (ref 0.0–149.0)
Total CHOL/HDL Ratio: 4
VLDL: 43.8 mg/dL — ABNORMAL HIGH (ref 0.0–40.0)

## 2013-08-21 LAB — ALT: ALT: 26 U/L (ref 0–53)

## 2013-08-21 LAB — PSA: PSA: 0.01 ng/mL — AB (ref 0.10–4.00)

## 2013-08-21 LAB — AST: AST: 27 U/L (ref 0–37)

## 2013-09-17 ENCOUNTER — Encounter: Payer: Medicare Other | Admitting: Internal Medicine

## 2013-12-14 ENCOUNTER — Encounter: Payer: Self-pay | Admitting: Internal Medicine

## 2014-02-05 ENCOUNTER — Encounter: Payer: Self-pay | Admitting: Internal Medicine

## 2014-02-12 ENCOUNTER — Ambulatory Visit: Payer: Medicare Other | Admitting: Internal Medicine

## 2014-02-17 ENCOUNTER — Other Ambulatory Visit: Payer: Self-pay | Admitting: Internal Medicine

## 2014-02-26 ENCOUNTER — Encounter: Payer: Self-pay | Admitting: Internal Medicine

## 2014-02-26 ENCOUNTER — Ambulatory Visit (INDEPENDENT_AMBULATORY_CARE_PROVIDER_SITE_OTHER): Payer: Medicare Other | Admitting: Internal Medicine

## 2014-02-26 VITALS — BP 143/86 | HR 61 | Temp 97.5°F | Wt 162.0 lb

## 2014-02-26 DIAGNOSIS — J309 Allergic rhinitis, unspecified: Secondary | ICD-10-CM

## 2014-02-26 DIAGNOSIS — R5382 Chronic fatigue, unspecified: Secondary | ICD-10-CM

## 2014-02-26 DIAGNOSIS — I341 Nonrheumatic mitral (valve) prolapse: Secondary | ICD-10-CM

## 2014-02-26 LAB — CBC WITH DIFFERENTIAL/PLATELET
BASOS PCT: 0.6 % (ref 0.0–3.0)
Basophils Absolute: 0 10*3/uL (ref 0.0–0.1)
EOS ABS: 0.2 10*3/uL (ref 0.0–0.7)
EOS PCT: 3.2 % (ref 0.0–5.0)
HEMATOCRIT: 45.5 % (ref 39.0–52.0)
HEMOGLOBIN: 15.2 g/dL (ref 13.0–17.0)
LYMPHS ABS: 1.2 10*3/uL (ref 0.7–4.0)
Lymphocytes Relative: 21.4 % (ref 12.0–46.0)
MCHC: 33.5 g/dL (ref 30.0–36.0)
MCV: 97 fl (ref 78.0–100.0)
MONO ABS: 0.5 10*3/uL (ref 0.1–1.0)
Monocytes Relative: 8.9 % (ref 3.0–12.0)
NEUTROS ABS: 3.8 10*3/uL (ref 1.4–7.7)
NEUTROS PCT: 65.9 % (ref 43.0–77.0)
Platelets: 163 10*3/uL (ref 150.0–400.0)
RBC: 4.69 Mil/uL (ref 4.22–5.81)
RDW: 12.6 % (ref 11.5–15.5)
WBC: 5.7 10*3/uL (ref 4.0–10.5)

## 2014-02-26 LAB — BASIC METABOLIC PANEL
BUN: 14 mg/dL (ref 6–23)
CALCIUM: 9.7 mg/dL (ref 8.4–10.5)
CHLORIDE: 105 meq/L (ref 96–112)
CO2: 22 meq/L (ref 19–32)
Creatinine, Ser: 1 mg/dL (ref 0.4–1.5)
GFR: 79.11 mL/min (ref >60.00)
GLUCOSE: 89 mg/dL (ref 70–99)
Potassium: 4.5 mEq/L (ref 3.5–5.1)
SODIUM: 139 meq/L (ref 135–145)

## 2014-02-26 LAB — TSH: TSH: 2.48 u[IU]/mL (ref 0.35–4.50)

## 2014-02-26 NOTE — Assessment & Plan Note (Signed)
Recommend to discontinue antihistaminics, change to Flonase. See comments under fatigue

## 2014-02-26 NOTE — Progress Notes (Signed)
Subjective:    Patient ID: Brandon Burke, male    DOB: [birth year], 80 y.o.   MRN: [id number]  DOS:  02/26/2014 Type of visit - description : rov, here w/ wife Interval history: --Few months history of ill-defined symptoms, fatigue, tired, fall sleep easy, mild DOE; not doing anything different in except taking antihistaminics for allergies for the last few weeks. --MVP, on atenolol --Also complaining of dizziness for a few seconds sometimes when he stands up. No nausea vomiting.    ROS Denies chest pain, lower extremity edema, palpitations or orthopnea No vomiting, diarrhea or blood in the stools No cough or bronchial congestion No headaches, diplopia, slurred speech, motor deficits No anxiety or depression per se   Past Medical History  Diagnosis Date  . Mitral valve prolapse   . PE (pulmonary embolism)     5-09:was referred to hematology, and they recommend to discontinue Coumadin 5/11  . DVT (deep venous thrombosis)     hx of 2001 (after prostate surgery)  . Hyperlipidemia   . Prostate cancer      released from urology in 2010,needs yearly PSAs with PCP    Past Surgical History  Procedure Laterality Date  . Prostatectomy  2000    History   Social History  . Marital Status: Married    Spouse Name: N/A    Number of Children: 0  . Years of Education: N/A   Occupational History  . Retired    Social History Main Topics  . Smoking status: Never Smoker   . Smokeless tobacco: Never Used  . Alcohol Use: No  . Drug Use: No  . Sexual Activity: Not on file   Other Topics Concern  . Not on file   Social History Narrative   Married, wife w/ melanoma, advanced     no children         Medication List       This list is accurate as of: 02/26/14 11:59 PM.  Always use your most recent med list.               aspirin 81 MG tablet  Take 162 mg by mouth daily.     atenolol 50 MG tablet  Commonly known as:  TENORMIN  Take 1 tablet by mouth  daily     cyclobenzaprine 5 MG tablet  Commonly known as:  FLEXERIL  TAKE ONE TABLET BY MOUTH TWICE DAILY AS NEEDED     folic acid 732 MCG tablet  Commonly known as:  FOLVITE  Take 400 mcg by mouth daily.     multivitamin,tx-minerals tablet  Take 1 tablet by mouth daily.     pravastatin 40 MG tablet  Commonly known as:  PRAVACHOL  Take 1 tablet by mouth  daily           Objective:   Physical Exam BP 143/86  Pulse 61  Temp(Src) 97.5 F (36.4 C) (Oral)  Wt 162 lb (73.483 kg)  SpO2 100%  General -- alert, well-developed, NAD.  Neck --no thyromegaly  no JVD at 45,  HEENT-- Not pale.   Lungs -- normal respiratory effort, no intercostal retractions, no accessory muscle use, and normal breath sounds.  Heart-- normal rate, regular rhythm, no murmur.  Abdomen-- Not distended, good bowel sounds,soft, non-tender.  Extremities-- no pretibial edema bilaterally  Neurologic--  alert & oriented X3. Speech normal, gait appropriate for age, strength symmetric and appropriate for age.    Psych-- Cognition and judgment appear intact. Cooperative  with normal attention span and concentration. No anxious or depressed appearing.       Assessment & Plan:   Dizziness, like   benign, recommend observation

## 2014-02-26 NOTE — Assessment & Plan Note (Signed)
On beta blockers

## 2014-02-26 NOTE — Patient Instructions (Signed)
Get your blood work before you leave   Stop the antihistaminics Start OTC flonase 2 sprays on each side of the nose daily for allergies  Please come back to the office by 08-2014  for a physical exam. Come back fasting

## 2014-02-26 NOTE — Progress Notes (Signed)
Pre visit review using our clinic review tool, if applicable. No additional management support is needed unless otherwise documented below in the visit note. 

## 2014-02-26 NOTE — Assessment & Plan Note (Signed)
Nonspecific symptoms, review of systems negative. Recommend to stop antihistaminics which may cause fatigue. General labs He has been less active lately, recommend to gradually go back to exercise regimen. Call if not improving

## 2014-02-28 ENCOUNTER — Encounter: Payer: Self-pay | Admitting: Internal Medicine

## 2014-05-01 ENCOUNTER — Encounter: Payer: Self-pay | Admitting: Internal Medicine

## 2014-05-15 DIAGNOSIS — H02055 Trichiasis without entropian left lower eyelid: Secondary | ICD-10-CM | POA: Diagnosis not present

## 2014-05-15 DIAGNOSIS — H4011X1 Primary open-angle glaucoma, mild stage: Secondary | ICD-10-CM | POA: Diagnosis not present

## 2014-06-12 DIAGNOSIS — H4011X1 Primary open-angle glaucoma, mild stage: Secondary | ICD-10-CM | POA: Diagnosis not present

## 2014-07-23 ENCOUNTER — Emergency Department (HOSPITAL_BASED_OUTPATIENT_CLINIC_OR_DEPARTMENT_OTHER): Payer: Medicare Other

## 2014-07-23 ENCOUNTER — Encounter (HOSPITAL_BASED_OUTPATIENT_CLINIC_OR_DEPARTMENT_OTHER): Payer: Self-pay

## 2014-07-23 ENCOUNTER — Ambulatory Visit: Payer: Self-pay | Admitting: Adult Health

## 2014-07-23 ENCOUNTER — Encounter: Payer: Self-pay | Admitting: Adult Health

## 2014-07-23 ENCOUNTER — Ambulatory Visit (INDEPENDENT_AMBULATORY_CARE_PROVIDER_SITE_OTHER): Payer: Medicare Other | Admitting: Adult Health

## 2014-07-23 ENCOUNTER — Telehealth: Payer: Self-pay | Admitting: Internal Medicine

## 2014-07-23 ENCOUNTER — Emergency Department (HOSPITAL_BASED_OUTPATIENT_CLINIC_OR_DEPARTMENT_OTHER)
Admission: EM | Admit: 2014-07-23 | Discharge: 2014-07-23 | Disposition: A | Discharge status: Home / Self Care | Payer: Medicare Other | Attending: Emergency Medicine | Admitting: Emergency Medicine

## 2014-07-23 VITALS — BP 148/78 | HR 66 | Temp 98.4°F | Resp 18 | Ht 71.5 in | Wt 160.8 lb

## 2014-07-23 DIAGNOSIS — M79661 Pain in right lower leg: Secondary | ICD-10-CM | POA: Diagnosis not present

## 2014-07-23 DIAGNOSIS — E785 Hyperlipidemia, unspecified: Secondary | ICD-10-CM | POA: Insufficient documentation

## 2014-07-23 DIAGNOSIS — Z8546 Personal history of malignant neoplasm of prostate: Secondary | ICD-10-CM | POA: Insufficient documentation

## 2014-07-23 DIAGNOSIS — Z86711 Personal history of pulmonary embolism: Secondary | ICD-10-CM | POA: Insufficient documentation

## 2014-07-23 DIAGNOSIS — Z7982 Long term (current) use of aspirin: Secondary | ICD-10-CM | POA: Diagnosis not present

## 2014-07-23 DIAGNOSIS — Z79899 Other long term (current) drug therapy: Secondary | ICD-10-CM | POA: Diagnosis not present

## 2014-07-23 DIAGNOSIS — Z8679 Personal history of other diseases of the circulatory system: Secondary | ICD-10-CM | POA: Insufficient documentation

## 2014-07-23 DIAGNOSIS — M79604 Pain in right leg: Secondary | ICD-10-CM | POA: Insufficient documentation

## 2014-07-23 DIAGNOSIS — Z86718 Personal history of other venous thrombosis and embolism: Secondary | ICD-10-CM | POA: Diagnosis not present

## 2014-07-23 LAB — CBC WITH DIFFERENTIAL/PLATELET
BASOS ABS: 0 10*3/uL (ref 0.0–0.1)
Basophils Relative: 0 % (ref 0–1)
Eosinophils Absolute: 0.1 10*3/uL (ref 0.0–0.7)
Eosinophils Relative: 1 % (ref 0–5)
HCT: 41.4 % (ref 39.0–52.0)
Hemoglobin: 14.4 g/dL (ref 13.0–17.0)
LYMPHS ABS: 0.7 10*3/uL (ref 0.7–4.0)
Lymphocytes Relative: 16 % (ref 12–46)
MCH: 33.1 pg (ref 26.0–34.0)
MCHC: 34.8 g/dL (ref 30.0–36.0)
MCV: 95.2 fL (ref 78.0–100.0)
Monocytes Absolute: 0.5 10*3/uL (ref 0.1–1.0)
Monocytes Relative: 11 % (ref 3–12)
Neutro Abs: 3.3 10*3/uL (ref 1.7–7.7)
Neutrophils Relative %: 72 % (ref 43–77)
Platelets: 154 10*3/uL (ref 150–400)
RBC: 4.35 MIL/uL (ref 4.22–5.81)
RDW: 12 % (ref 11.5–15.5)
WBC: 4.6 10*3/uL (ref 4.0–10.5)

## 2014-07-23 LAB — BASIC METABOLIC PANEL
Anion gap: 8 (ref 5–15)
BUN: 15 mg/dL (ref 6–23)
CO2: 28 mmol/L (ref 19–32)
Calcium: 9.5 mg/dL (ref 8.4–10.5)
Chloride: 103 mmol/L (ref 96–112)
Creatinine, Ser: 0.98 mg/dL (ref 0.50–1.35)
GFR calc Af Amer: 88 mL/min — ABNORMAL LOW (ref >90)
GFR calc non Af Amer: 76 mL/min — ABNORMAL LOW (ref >90)
GLUCOSE: 101 mg/dL — AB (ref 70–99)
POTASSIUM: 4 mmol/L (ref 3.5–5.1)
SODIUM: 139 mmol/L (ref 135–145)

## 2014-07-23 LAB — CK: Total CK: 54 U/L (ref 7–232)

## 2014-07-23 NOTE — ED Notes (Signed)
Right calf pain since this am. Hx of DVT. Sent from PMD

## 2014-07-23 NOTE — ED Notes (Signed)
Patient transported to Ultrasound 

## 2014-07-23 NOTE — Patient Instructions (Signed)
Proceed to ER for further evaluation of right calf pain.,

## 2014-07-23 NOTE — ED Provider Notes (Signed)
  Physical Exam  BP 145/61 mmHg  Pulse 61  Temp(Src) 98.1 F (36.7 C) (Oral)  Resp 16  Ht 5' 11.5" (1.816 m)  Wt 160 lb (72.576 kg)  BMI 22.01 kg/m2  SpO2 99%  Physical Exam  ED Course  Procedures  MDM RO DVT on R, pending DVT study.  This was unremarkable.  Discussed possible occult pathology with pt and encouraged close PCP fu.  DC home in stable condition.  Pain of right lower extremity         Debby Freiberg, MD 07/23/14 1723

## 2014-07-23 NOTE — ED Provider Notes (Signed)
CSN: [id number]     Arrival date & time 07/23/14  1353 History   First MD Initiated Contact with Patient 07/23/14 1403     Chief Complaint  Patient presents with  . Leg Pain     (Consider location/radiation/quality/duration/timing/severity/associated sxs/prior Treatment) HPI Comments: Right calf pain since 6 AM. No trauma. Patient sent from PCP to rule out DVT. Has had 2 DVTs in the past. No longer anticoagulated since 2011. History of prostate cancer status post resection. Denies chest pain or shortness of breath today. Denies any fevers, chills, nausea or vomiting. No focal weakness, numbness or tingling. No back pain or abdominal pain.  The history is provided by the patient.    Past Medical History  Diagnosis Date  . Mitral valve prolapse   . PE (pulmonary embolism)     5-09:was referred to hematology, and they recommend to discontinue Coumadin 5/11  . DVT (deep venous thrombosis)     hx of 2001 (after prostate surgery)  . Hyperlipidemia   . Prostate cancer      released from urology in 2010,needs yearly PSAs with PCP   Past Surgical History  Procedure Laterality Date  . Prostatectomy  2000   Family History  Problem Relation Age of Onset  . CAD Father 70  . Diabetes Neg Hx   . Colon cancer Neg Hx   . Prostate cancer Neg Hx    History  Substance Use Topics  . Smoking status: Never Smoker   . Smokeless tobacco: Never Used  . Alcohol Use: No    Review of Systems  Constitutional: Negative for fever, activity change and appetite change.  HENT: Negative for congestion and rhinorrhea.   Eyes: Negative for visual disturbance.  Respiratory: Negative for cough, chest tightness and shortness of breath.   Gastrointestinal: Negative for nausea, vomiting and abdominal pain.  Genitourinary: Negative for dysuria, urgency and hematuria.  Musculoskeletal: Positive for myalgias and arthralgias.  Skin: Negative for rash.  Neurological: Negative for dizziness, weakness,  light-headedness and headaches.  A complete 10 system review of systems was obtained and all systems are negative except as noted in the HPI and PMH.      Allergies  Nitrofuran derivatives and Sulfonamide derivatives  Home Medications   Prior to Admission medications   Medication Sig Start Date End Date Taking? Authorizing Provider  aspirin 81 MG tablet Take 162 mg by mouth daily.    Historical Provider, MD  atenolol (TENORMIN) 50 MG tablet Take 1 tablet by mouth  daily 02/18/14   Colon Branch, MD  cyclobenzaprine (FLEXERIL) 5 MG tablet TAKE ONE TABLET BY MOUTH TWICE DAILY AS NEEDED 05/11/13   Colon Branch, MD  fluticasone [facility]) 50 MCG/ACT nasal spray Place 1 spray into both nostrils daily as needed for allergies or rhinitis.    Historical Provider, MD  folic acid (FOLVITE) 357 MCG tablet Take 400 mcg by mouth daily.      Historical Provider, MD  latanoprost (XALATAN) 0.005 % ophthalmic solution Place 1 drop into both eyes at bedtime.    Historical Provider, MD  Multiple Vitamins-Minerals (MULTIVITAMIN,TX-MINERALS) tablet Take 1 tablet by mouth daily.      Historical Provider, MD  pravastatin (PRAVACHOL) 40 MG tablet Take 1 tablet by mouth  daily 02/18/14   Colon Branch, MD   BP 105/49 mmHg  Pulse 65  Temp(Src) 98.1 F (36.7 C) (Oral)  Resp 16  Ht 5' 11.5" (1.816 m)  Wt 160 lb (72.576 kg)  BMI 22.01  kg/m2  SpO2 100% Physical Exam  Constitutional: He is oriented to person, place, and time. He appears well-developed and well-nourished. No distress.  HENT:  Head: Normocephalic and atraumatic.  Mouth/Throat: Oropharynx is clear and moist. No oropharyngeal exudate.  Eyes: Conjunctivae and EOM are normal. Pupils are equal, round, and reactive to light.  Neck: Normal range of motion. Neck supple.  No meningismus.  Cardiovascular: Normal rate, regular rhythm, normal heart sounds and intact distal pulses.   No murmur heard. Pulmonary/Chest: Effort normal and breath sounds normal. No  respiratory distress.  Abdominal: Soft. There is no tenderness. There is no rebound and no guarding.  Musculoskeletal: Normal range of motion. He exhibits tenderness. He exhibits no edema.  Right calf tenderness, no appreciable asymmetry, no overlying skin change. Intact DP and PT pulses  Neurological: He is alert and oriented to person, place, and time. No cranial nerve deficit. He exhibits normal muscle tone. Coordination normal.  No ataxia on finger to nose bilaterally. No pronator drift. 5/5 strength throughout. CN 2-12 intact. Negative Romberg. Equal grip strength. Sensation intact. Gait is normal.   Skin: Skin is warm.  Psychiatric: He has a normal mood and affect. His behavior is normal.  Nursing note and vitals reviewed.   ED Course  Procedures (including critical care time) Labs Review Labs Reviewed  BASIC METABOLIC PANEL - Abnormal; Notable for the following:    Glucose, Bld 101 (*)    GFR calc non Af Amer 76 (*)    GFR calc Af Amer 88 (*)    All other components within normal limits  CBC WITH DIFFERENTIAL/PLATELET  CK    Imaging Review No results found.   EKG Interpretation None      MDM   Final diagnoses:  None   Calf pain with history of DVT. No chest pain or shortness of breath to suggest PE. No hypoxia or tachycardia.  Doppler to rule out DVT. Check electrolytes and CK.  Labs and doppler pending at time of sign out to Dr. Colin Rhein.     Ezequiel Essex, MD 07/23/14 [phone]

## 2014-07-23 NOTE — Discharge Instructions (Signed)

## 2014-07-23 NOTE — ED Notes (Signed)
MD at bedside discussing test results and dispo plan of care. 

## 2014-07-23 NOTE — Progress Notes (Addendum)
Subjective:    Patient ID: Brandon Burke, male    DOB: 09/04/1931, 81 y.o.   MRN: [id number]  HPI  81 year old male who presents to the office for right calf pain since 6 am this morning. Feels as though the pain is dull in nature. PMH significant for DVT/PE. Denies SOB, palpitations, or tachycardia. Not currently anticoagulated. Hemodynamically stable at this time. Endorses taking two regular ASA this morning when he felt the pain.   2009 DVT in Right leg and bilateral PE  2000 DVT in left leg.   Review of Systems  Constitutional: Negative for activity change and appetite change.  Respiratory: Negative for cough, chest tightness and shortness of breath.   Cardiovascular: Negative for chest pain and leg swelling.       Right calf pain  Musculoskeletal: Negative for joint swelling.  Skin: Negative for color change and rash.   Past Medical History  Diagnosis Date  . Mitral valve prolapse   . PE (pulmonary embolism)     5-09:was referred to hematology, and they recommend to discontinue Coumadin 5/11  . DVT (deep venous thrombosis)     hx of 2001 (after prostate surgery)  . Hyperlipidemia   . Prostate cancer      released from urology in 2010,needs yearly PSAs with PCP    History   Social History  . Marital Status: Married    Spouse Name: N/A  . Number of Children: 0  . Years of Education: N/A   Occupational History  . Retired    Social History Main Topics  . Smoking status: Never Smoker   . Smokeless tobacco: Never Used  . Alcohol Use: No  . Drug Use: No  . Sexual Activity: Not on file   Other Topics Concern  . Not on file   Social History Narrative   Married, wife w/ melanoma, advanced     no children     Past Surgical History  Procedure Laterality Date  . Prostatectomy  2000    Family History  Problem Relation Age of Onset  . CAD Father 16  . Diabetes Neg Hx   . Colon cancer Neg Hx   . Prostate cancer Neg Hx     Allergies  Allergen  Reactions  . Nitrofuran Derivatives     headache  . Sulfonamide Derivatives Nausea And Vomiting    Current Outpatient Prescriptions on File Prior to Visit  Medication Sig Dispense Refill  . aspirin 81 MG tablet Take 162 mg by mouth daily.    Marland Kitchen atenolol (TENORMIN) 50 MG tablet Take 1 tablet by mouth  daily 90 tablet 1  . cyclobenzaprine (FLEXERIL) 5 MG tablet TAKE ONE TABLET BY MOUTH TWICE DAILY AS NEEDED 40 tablet 0  . folic acid (FOLVITE) 517 MCG tablet Take 400 mcg by mouth daily.      . Multiple Vitamins-Minerals (MULTIVITAMIN,TX-MINERALS) tablet Take 1 tablet by mouth daily.      . pravastatin (PRAVACHOL) 40 MG tablet Take 1 tablet by mouth  daily 90 tablet 1   No current facility-administered medications on file prior to visit.    BP 148/78 mmHg  Pulse 66  Temp(Src) 98.4 F (36.9 C) (Oral)  Resp 18  Ht 5' 11.5" (1.816 m)  Wt 160 lb 12.8 oz (72.938 kg)  BMI 22.12 kg/m2  SpO2 97%       Objective:   Physical Exam  Constitutional: He is oriented to person, place, and time. He appears well-developed and well-nourished.  No distress.  Cardiovascular: Normal rate and normal heart sounds.  Exam reveals no friction rub.   No murmur heard. Pulmonary/Chest: No respiratory distress. He has no wheezes. He has no rales. He exhibits no tenderness.  Musculoskeletal: He exhibits tenderness.  Dull pain in right calf with tenderness. No warmth, erythema, and/or superficial venous dilation   Neurological: He is alert and oriented to person, place, and time.  Skin: Skin is warm and dry.  Psychiatric: He has a normal mood and affect. His behavior is normal.       Assessment & Plan:  Possible DVT -  Attempted to rule out DVT/PE in clinic. Was not able to get an [place] until after 8 pm tonight ,due to PMH, sending patient to the ER for evaluation in a timely manner. Report called to charge nurse in ER.  - Patient has understanding of plan and is agreeable - Discussed plan with Debbrah Alar, NP

## 2014-07-23 NOTE — Progress Notes (Signed)
Pre visit review using our clinic review tool, if applicable. No additional management support is needed unless otherwise documented below in the visit note. 

## 2014-07-23 NOTE — Telephone Encounter (Signed)
[facility] - Client [facility]  Patient Name: Brandon Burke  DOB: Aug 07, 1931    Initial Comment Caller states he is having right calf pain and he has a history of blood clots. He wants to know will the office be able to give him an ultrasound of the leg.    Nurse Assessment  Nurse: Wynetta Emery, RN, [place] [facility] Date/Time Eilene Ghazi Time): 07/23/2014 10:16:56 AM  Confirm and document reason for call. If symptomatic, describe symptoms. ---[name] has history of blood clots in right calf -- having unusual pain in right calf of leg for 4 hours 6am onset  Has the patient traveled out of the country within the last 30 days? ---No  Does the patient require triage? ---Yes  Related visit to physician within the last 2 weeks? ---No  Does the PT have any chronic conditions? (i.e. diabetes, asthma, etc.) ---Yes  List chronic conditions. ---DVT right leg     Guidelines    Guideline Title Affirmed Question Affirmed Notes  Leg Pain [1] Thigh or calf pain AND [2] only 1 side AND [3] present > 1 hour    Final Disposition User   See Physician within 4 Hours (or PCP triage) Wynetta Emery, RN, [place] office to see if they wanted nurse to send to UC or if they wanted to order an [place] for him since could not make appt; Office states they have another PA coming in [place] and advised to check his schedule was able to pull his schedule and appt made for 100pm with Sallee Provencal, PA Patient notifed

## 2014-08-03 ENCOUNTER — Other Ambulatory Visit: Payer: Self-pay | Admitting: Internal Medicine

## 2014-08-07 ENCOUNTER — Telehealth: Payer: Self-pay | Admitting: Internal Medicine

## 2014-08-07 NOTE — Telephone Encounter (Signed)
Pre Visit letter sent  °

## 2014-08-20 DIAGNOSIS — H02054 Trichiasis without entropian left upper eyelid: Secondary | ICD-10-CM | POA: Diagnosis not present

## 2014-08-28 ENCOUNTER — Telehealth: Payer: Self-pay | Admitting: *Deleted

## 2014-08-28 ENCOUNTER — Encounter: Payer: Self-pay | Admitting: *Deleted

## 2014-08-28 NOTE — Telephone Encounter (Signed)
Pre-Visit Call completed with patient and chart updated.   Pre-Visit Info documented in Specialty Comments under [employer].    

## 2014-08-29 ENCOUNTER — Ambulatory Visit (INDEPENDENT_AMBULATORY_CARE_PROVIDER_SITE_OTHER): Payer: Medicare Other | Admitting: Internal Medicine

## 2014-08-29 ENCOUNTER — Encounter: Payer: Self-pay | Admitting: Internal Medicine

## 2014-08-29 VITALS — BP 122/78 | HR 76 | Temp 98.0°F | Ht 72.0 in | Wt 160.0 lb

## 2014-08-29 DIAGNOSIS — I341 Nonrheumatic mitral (valve) prolapse: Secondary | ICD-10-CM

## 2014-08-29 DIAGNOSIS — G4700 Insomnia, unspecified: Secondary | ICD-10-CM

## 2014-08-29 DIAGNOSIS — Z8546 Personal history of malignant neoplasm of prostate: Secondary | ICD-10-CM | POA: Diagnosis not present

## 2014-08-29 DIAGNOSIS — Z23 Encounter for immunization: Secondary | ICD-10-CM

## 2014-08-29 DIAGNOSIS — E785 Hyperlipidemia, unspecified: Secondary | ICD-10-CM | POA: Diagnosis not present

## 2014-08-29 DIAGNOSIS — M150 Primary generalized (osteo)arthritis: Secondary | ICD-10-CM

## 2014-08-29 DIAGNOSIS — Z0000 Encounter for general adult medical examination without abnormal findings: Secondary | ICD-10-CM | POA: Diagnosis not present

## 2014-08-29 DIAGNOSIS — M159 Polyosteoarthritis, unspecified: Secondary | ICD-10-CM

## 2014-08-29 LAB — LIPID PANEL
CHOL/HDL RATIO: 3
Cholesterol: 114 mg/dL (ref 0–200)
HDL: 33.3 mg/dL — ABNORMAL LOW (ref >39.00)
LDL Cholesterol: 52 mg/dL (ref 0–99)
NonHDL: 80.7
Triglycerides: 146 mg/dL (ref 0.0–149.0)
VLDL: 29.2 mg/dL (ref 0.0–40.0)

## 2014-08-29 LAB — AST: AST: 20 U/L (ref 0–37)

## 2014-08-29 LAB — PSA: PSA: 0.01 ng/mL — ABNORMAL LOW (ref 0.10–4.00)

## 2014-08-29 LAB — ALT: ALT: 18 U/L (ref 0–53)

## 2014-08-29 MED ORDER — ATENOLOL 50 MG PO TABS: 50.0000 mg | ORAL_TABLET | Freq: Every day | ORAL | Status: DC

## 2014-08-29 MED ORDER — PRAVASTATIN SODIUM 40 MG PO TABS: 40.0000 mg | ORAL_TABLET | Freq: Every day | ORAL | Status: DC

## 2014-08-29 NOTE — Assessment & Plan Note (Signed)
Occasionally takes Flexeril, Tylenol or naproxen

## 2014-08-29 NOTE — Assessment & Plan Note (Addendum)
Td   2013 pneumonia shot 2006 prevnar -- today shingles shot: had already  cscope 01-2005, had tics, pt electing no further screening cscopes or hemoccults  status post prostatectomy for prostate cancer around 2000, discharge from urology 2010, Rx yearly PSAs ---pt likes to cont checking, will get a psa  today   counseled: continue with his healthy life style

## 2014-08-29 NOTE — Patient Instructions (Signed)
Get your labs Come back to the office in 1 year   for a physical exam  Please schedule an appointment at the front desk    Come back fasting       Fall Prevention and Home Safety Falls cause injuries and can affect all age groups. It is possible to use preventive measures to significantly decrease the likelihood of falls. There are many simple measures which can make your home safer and prevent falls. OUTDOORS  Repair cracks and edges of walkways and driveways.  Remove high doorway thresholds.  Trim shrubbery on the main path into your home.  Have good outside lighting.  Clear walkways of tools, rocks, debris, and clutter.  Check that handrails are not broken and are securely fastened. Both sides of steps should have handrails.  Have leaves, snow, and ice cleared regularly.  Use sand or salt on walkways during winter months.  In the garage, clean up grease or oil spills. BATHROOM  Install night lights.  Install grab bars by the toilet and in the tub and shower.  Use non-skid mats or decals in the tub or shower.  Place a plastic non-slip stool in the shower to sit on, if needed.  Keep floors dry and clean up all water on the floor immediately.  Remove soap buildup in the tub or shower on a regular basis.  Secure bath mats with non-slip, double-sided rug tape.  Remove throw rugs and tripping hazards from the floors. BEDROOMS  Install night lights.  Make sure a bedside light is easy to reach.  Do not use oversized bedding.  Keep a telephone by your bedside.  Have a firm chair with side arms to use for getting dressed.  Remove throw rugs and tripping hazards from the floor. KITCHEN  Keep handles on pots and pans turned toward the center of the stove. Use back burners when possible.  Clean up spills quickly and allow time for drying.  Avoid walking on wet floors.  Avoid hot utensils and knives.  Position shelves so they are not too high or low.  Place  commonly used objects within easy reach.  If necessary, use a sturdy step stool with a grab bar when reaching.  Keep electrical cables out of the way.  Do not use floor polish or wax that makes floors slippery. If you must use wax, use non-skid floor wax.  Remove throw rugs and tripping hazards from the floor. STAIRWAYS  Never leave objects on stairs.  Place handrails on both sides of stairways and use them. Fix any loose handrails. Make sure handrails on both sides of the stairways are as long as the stairs.  Check carpeting to make sure it is firmly attached along stairs. Make repairs to worn or loose carpet promptly.  Avoid placing throw rugs at the top or bottom of stairways, or properly secure the rug with carpet tape to prevent slippage. Get rid of throw rugs, if possible.  Have an electrician put in a light switch at the top and bottom of the stairs. OTHER FALL PREVENTION TIPS  Wear low-heel or rubber-soled shoes that are supportive and fit well. Wear closed toe shoes.  When using a stepladder, make sure it is fully opened and both spreaders are firmly locked. Do not climb a closed stepladder.  Add color or contrast paint or tape to grab bars and handrails in your home. Place contrasting color strips on first and last steps.  Learn and use mobility aids as needed. Install  an electrical emergency response system.  Turn on lights to avoid dark areas. Replace light bulbs that burn out immediately. Get light switches that glow.  Arrange furniture to create clear pathways. Keep furniture in the same place.  Firmly attach carpet with non-skid or double-sided tape.  Eliminate uneven floor surfaces.  Select a carpet pattern that does not visually hide the edge of steps.  Be aware of all pets. OTHER HOME SAFETY TIPS  Set the water temperature for 120 F (48.8 C).  Keep emergency numbers on or near the telephone.  Keep smoke detectors on every level of the home and near  sleeping areas. Document Released: 04/09/2002 Document Revised: 10/19/2011 Document Reviewed: 07/09/2011 [facility] Patient Information 2015 [place], [place]. This information is not intended to replace advice given to you by your health care provider. Make sure you discuss any questions you have with your health care provider.   Preventive Care for Adults Ages 57 and over  Blood pressure check.** / Every 1 to 2 years.  Lipid and cholesterol check.**/ Every 5 years beginning at age 46.  Lung cancer screening. / Every year if you are aged 67-80 years and have a 30-pack-year history of smoking and currently smoke or have quit within the past 15 years. Yearly screening is stopped once you have quit smoking for at least 15 years or develop a health problem that would prevent you from having lung cancer treatment.  Fecal occult blood test (FOBT) of stool. / Every year beginning at age 58 and continuing until age 18. You may not have to do this test if you get a colonoscopy every 10 years.  Flexible sigmoidoscopy** or colonoscopy.** / Every 5 years for a flexible sigmoidoscopy or every 10 years for a colonoscopy beginning at age 61 and continuing until age 33.  Hepatitis C blood test.** / For all people born from [birth year] through 1963 and any individual with known risks for hepatitis C.  Abdominal aortic aneurysm (AAA) screening.** / A one-time screening for ages 72 to 4 years who are current or former smokers.  Skin self-exam. / Monthly.  Influenza vaccine. / Every year.  Tetanus, diphtheria, and acellular pertussis (Tdap/Td) vaccine.** / 1 dose of Td every 10 years.  Varicella vaccine.** / Consult your health care provider.  Zoster vaccine.** / 1 dose for adults aged 71 years or older.  Pneumococcal 13-valent conjugate (PCV13) vaccine.** / Consult your health care provider.  Pneumococcal polysaccharide (PPSV23) vaccine.** / 1 dose for all adults aged 85 years and older.  Meningococcal  vaccine.** / Consult your health care provider.  Hepatitis A vaccine.** / Consult your health care provider.  Hepatitis B vaccine.** / Consult your health care provider.  Haemophilus influenzae type b (Hib) vaccine.** / Consult your health care provider. **Family history and personal history of risk and conditions may change your health care provider's recommendations. Document Released: 06/15/2001 Document Revised: 04/24/2013 Document Reviewed: 09/14/2010 [facility] Patient Information 2015 [place], [place]. This information is not intended to replace advice given to you by your health care provider. Make sure you discuss any questions you have with your health care provider.

## 2014-08-29 NOTE — Assessment & Plan Note (Signed)
Seems to be doing great, check a PSA

## 2014-08-29 NOTE — Assessment & Plan Note (Signed)
On beta blockers, asymptomatic

## 2014-08-29 NOTE — Assessment & Plan Note (Signed)
Currently not an issue, uses OTCs as needed

## 2014-08-29 NOTE — Progress Notes (Signed)
Pre visit review using our clinic review tool, if applicable. No additional management support is needed unless otherwise documented below in the visit note. 

## 2014-08-29 NOTE — Progress Notes (Signed)
Subjective:    Patient ID: Brandon Burke, male    DOB: 28-Dec-1931, 81 y.o.   MRN: [id number]  DOS:  08/29/2014 Type of visit - description :  Here for Medicare AWV:  1. Risk factors based on Past M, S, F history: reviewed  2. Physical Activities: slt less active this year, plans to do better  3. Depression/mood: neg screening 4. Hearing: this year reports no problem, last year was referred to audiology but didn't go 5. ADL's: independent , still drive  6. Fall Risk: +risk for falls,   see instructions  7. Home Safety: does feel safe at home  8. Height, weight, &visual acuity: see VS, sees eye doctor routinely, has GLAUCOMA, VISION OK  9. Counseling: yes  10. Labs ordered based on risk factors: yes  11. Referral Coordination, if needed  12. Care Plan, see a/p  13. Cognitive Assessment : cognition, memory and motor skill seem appropiate  14.care team updated  15. End of life care-- already d/w wife  in addition, we discussed the following High cholesterol, good compliance with medications. Due for labs Mitral valve prolapse, on beta blockers, asymptomatic Last time he was here complaining of mild fatigue, still has mild symptoms on and off but not having major problems Insomnia, occasionally takes a OTC with good results. Prostate cancer, history of. Needs a PSA, doing great , asymptomatic  Review of Systems  Constitutional: No fever, chills. No unexplained wt changes. No unusual sweats HEENT: No dental problems, ear discharge, facial swelling, voice changes. No eye discharge, occ redness , f/u by opht. no intolerance to light Respiratory: No wheezing or difficulty breathing. No cough , mucus production Cardiovascular: No CP, leg swelling or palpitations GI: no nausea, vomiting, diarrhea or abdominal pain.  No blood in the stools. No dysphagia   Endocrine: No polyphagia, polyuria or polydipsia GU: No dysuria, gross hematuria, difficulty urinating. No urinary  urgency or frequency. Musculoskeletal: No joint swellings or unusual aches or pains Skin: No change in the color of the skin, palor or rash Allergic, immunologic: No environmental allergies or food allergies Neurological: No dizziness or syncope. No headaches. No diplopia, slurred speech, motor deficits, facial numbness Hematological: No enlarged lymph nodes, easy bruising or bleeding Psychiatry: No suicidal ideas, hallucinations, behavior problems or confusion. No unusual/severe anxiety or depression.    Past Medical History  Diagnosis Date  . Mitral valve prolapse   . PE (pulmonary embolism)     5-09:was referred to hematology, and they recommend to discontinue Coumadin 5/11  . DVT (deep venous thrombosis)     hx of 2001 (after prostate surgery)  . Hyperlipidemia   . Prostate cancer      released from urology in 2010,needs yearly PSAs with PCP    Past Surgical History  Procedure Laterality Date  . Prostatectomy  2000    History   Social History  . Marital Status: Married    Spouse Name: N/A  . Number of Children: 0  . Years of Education: N/A   Occupational History  . Retired    Social History Main Topics  . Smoking status: Never Smoker   . Smokeless tobacco: Never Used  . Alcohol Use: No  . Drug Use: No  . Sexual Activity: Not on file   Other Topics Concern  . Not on file   Social History Narrative   Married, wife w/ melanoma, advanced     no children      Family History  Problem Relation  Age of Onset  . CAD Father 22  . Diabetes Neg Hx   . Colon cancer Neg Hx   . Prostate cancer Neg Hx        Medication List       This list is accurate as of: 08/29/14 11:37 AM.  Always use your most recent med list.               aspirin 81 MG tablet  Take 162 mg by mouth daily.     atenolol 50 MG tablet  Commonly known as:  TENORMIN  Take 1 tablet (50 mg total) by mouth daily.     cyclobenzaprine 5 MG tablet  Commonly known as:  FLEXERIL  TAKE ONE  TABLET BY MOUTH TWICE DAILY AS NEEDED     diphenhydramine-acetaminophen 25-500 MG Tabs  Commonly known as:  TYLENOL PM  Take 1 tablet by mouth at bedtime as needed.     erythromycin ophthalmic ointment  1 application at bedtime.     fluticasone 50 MCG/ACT nasal spray  Commonly known as:  FLONASE  Place 1 spray into both nostrils daily as needed for allergies or rhinitis.     folic acid 092 MCG tablet  Commonly known as:  FOLVITE  Take 400 mcg by mouth daily.     latanoprost 0.005 % ophthalmic solution  Commonly known as:  XALATAN  Place 1 drop into both eyes at bedtime.     multivitamin,tx-minerals tablet  Take 1 tablet by mouth daily.     naproxen 250 MG tablet  Commonly known as:  NAPROSYN  Take 250 mg by mouth as needed.     pravastatin 40 MG tablet  Commonly known as:  PRAVACHOL  Take 1 tablet (40 mg total) by mouth daily.           Objective:   Physical Exam BP 122/78 mmHg  Pulse 76  Temp(Src) 98 F (36.7 C) (Oral)  Ht 6' (1.829 m)  Wt 160 lb (72.576 kg)  BMI 21.70 kg/m2  SpO2 98% General:   Well developed, well nourished . NAD.  HEENT:  Normocephalic . Face symmetric, atraumatic Neck --No thyromegaly, good carotid pulses Lungs:  CTA B Normal respiratory effort, no intercostal retractions, no accessory muscle use. Heart: RRR,  no murmur.  Abdomen:  Not distended, soft, non-tender. No rebound or rigidity. No mass,organomegaly;no bruit  Muscle skeletal: no pretibial edema bilaterally  Skin: Not pale. Not jaundice Neurologic:  alert & oriented X3.  Speech normal, gait appropriate for age and unassisted Psych--  Cognition and judgment appear intact.  Cooperative with normal attention span and concentration.  Behavior appropriate. No anxious or depressed appearing.       Assessment & Plan:

## 2014-08-29 NOTE — Assessment & Plan Note (Signed)
Good compliance with Pravachol, labs

## 2014-09-17 DIAGNOSIS — H4011X1 Primary open-angle glaucoma, mild stage: Secondary | ICD-10-CM | POA: Diagnosis not present

## 2014-09-17 DIAGNOSIS — H02055 Trichiasis without entropian left lower eyelid: Secondary | ICD-10-CM | POA: Diagnosis not present

## 2014-09-20 DIAGNOSIS — Z85828 Personal history of other malignant neoplasm of skin: Secondary | ICD-10-CM | POA: Diagnosis not present

## 2014-09-20 DIAGNOSIS — L570 Actinic keratosis: Secondary | ICD-10-CM | POA: Diagnosis not present

## 2014-09-20 DIAGNOSIS — L218 Other seborrheic dermatitis: Secondary | ICD-10-CM | POA: Diagnosis not present

## 2014-09-20 DIAGNOSIS — L910 Hypertrophic scar: Secondary | ICD-10-CM | POA: Diagnosis not present

## 2014-09-20 DIAGNOSIS — L821 Other seborrheic keratosis: Secondary | ICD-10-CM | POA: Diagnosis not present

## 2015-01-23 DIAGNOSIS — Z23 Encounter for immunization: Secondary | ICD-10-CM | POA: Diagnosis not present

## 2015-01-24 ENCOUNTER — Encounter: Payer: Self-pay | Admitting: Internal Medicine

## 2015-02-03 ENCOUNTER — Encounter: Payer: Self-pay | Admitting: Internal Medicine

## 2015-03-19 DIAGNOSIS — H04123 Dry eye syndrome of bilateral lacrimal glands: Secondary | ICD-10-CM | POA: Diagnosis not present

## 2015-03-19 DIAGNOSIS — H401131 Primary open-angle glaucoma, bilateral, mild stage: Secondary | ICD-10-CM | POA: Diagnosis not present

## 2015-04-01 DIAGNOSIS — L910 Hypertrophic scar: Secondary | ICD-10-CM | POA: Diagnosis not present

## 2015-04-01 DIAGNOSIS — L820 Inflamed seborrheic keratosis: Secondary | ICD-10-CM | POA: Diagnosis not present

## 2015-04-01 DIAGNOSIS — Z85828 Personal history of other malignant neoplasm of skin: Secondary | ICD-10-CM | POA: Diagnosis not present

## 2015-04-01 DIAGNOSIS — L570 Actinic keratosis: Secondary | ICD-10-CM | POA: Diagnosis not present

## 2015-04-01 DIAGNOSIS — L821 Other seborrheic keratosis: Secondary | ICD-10-CM | POA: Diagnosis not present

## 2015-04-22 DIAGNOSIS — H401131 Primary open-angle glaucoma, bilateral, mild stage: Secondary | ICD-10-CM | POA: Diagnosis not present

## 2015-07-20 ENCOUNTER — Other Ambulatory Visit: Payer: Self-pay | Admitting: Internal Medicine

## 2015-08-21 DIAGNOSIS — H02055 Trichiasis without entropian left lower eyelid: Secondary | ICD-10-CM | POA: Diagnosis not present

## 2015-08-21 DIAGNOSIS — H04123 Dry eye syndrome of bilateral lacrimal glands: Secondary | ICD-10-CM | POA: Diagnosis not present

## 2015-08-21 DIAGNOSIS — H401131 Primary open-angle glaucoma, bilateral, mild stage: Secondary | ICD-10-CM | POA: Diagnosis not present

## 2015-10-02 DIAGNOSIS — L812 Freckles: Secondary | ICD-10-CM | POA: Diagnosis not present

## 2015-10-02 DIAGNOSIS — L570 Actinic keratosis: Secondary | ICD-10-CM | POA: Diagnosis not present

## 2015-10-02 DIAGNOSIS — Z85828 Personal history of other malignant neoplasm of skin: Secondary | ICD-10-CM | POA: Diagnosis not present

## 2015-10-02 DIAGNOSIS — L821 Other seborrheic keratosis: Secondary | ICD-10-CM | POA: Diagnosis not present

## 2015-10-08 ENCOUNTER — Ambulatory Visit (INDEPENDENT_AMBULATORY_CARE_PROVIDER_SITE_OTHER): Payer: Medicare Other | Admitting: Internal Medicine

## 2015-10-08 ENCOUNTER — Encounter: Payer: Self-pay | Admitting: Internal Medicine

## 2015-10-08 VITALS — BP 118/76 | HR 62 | Temp 97.8°F | Ht 72.0 in | Wt 162.4 lb

## 2015-10-08 DIAGNOSIS — R5382 Chronic fatigue, unspecified: Secondary | ICD-10-CM | POA: Diagnosis not present

## 2015-10-08 DIAGNOSIS — E785 Hyperlipidemia, unspecified: Secondary | ICD-10-CM | POA: Diagnosis not present

## 2015-10-08 DIAGNOSIS — Z8546 Personal history of malignant neoplasm of prostate: Secondary | ICD-10-CM

## 2015-10-08 DIAGNOSIS — Z0000 Encounter for general adult medical examination without abnormal findings: Secondary | ICD-10-CM

## 2015-10-08 NOTE — Patient Instructions (Signed)
Get your blood work before you leave      Next visit in one year    Fall Prevention and Home Safety Falls cause injuries and can affect all age groups. It is possible to use preventive measures to significantly decrease the likelihood of falls. There are many simple measures which can make your home safer and prevent falls. OUTDOORS  Repair cracks and edges of walkways and driveways.  Remove high doorway thresholds.  Trim shrubbery on the main path into your home.  Have good outside lighting.  Clear walkways of tools, rocks, debris, and clutter.  Check that handrails are not broken and are securely fastened. Both sides of steps should have handrails.  Have leaves, snow, and ice cleared regularly.  Use sand or salt on walkways during winter months.  In the garage, clean up grease or oil spills. BATHROOM  Install night lights.  Install grab bars by the toilet and in the tub and shower.  Use non-skid mats or decals in the tub or shower.  Place a plastic non-slip stool in the shower to sit on, if needed.  Keep floors dry and clean up all water on the floor immediately.  Remove soap buildup in the tub or shower on a regular basis.  Secure bath mats with non-slip, double-sided rug tape.  Remove throw rugs and tripping hazards from the floors. BEDROOMS  Install night lights.  Make sure a bedside light is easy to reach.  Do not use oversized bedding.  Keep a telephone by your bedside.  Have a firm chair with side arms to use for getting dressed.  Remove throw rugs and tripping hazards from the floor. KITCHEN  Keep handles on pots and pans turned toward the center of the stove. Use back burners when possible.  Clean up spills quickly and allow time for drying.  Avoid walking on wet floors.  Avoid hot utensils and knives.  Position shelves so they are not too high or low.  Place commonly used objects within easy reach.  If necessary, use a sturdy step  stool with a grab bar when reaching.  Keep electrical cables out of the way.  Do not use floor polish or wax that makes floors slippery. If you must use wax, use non-skid floor wax.  Remove throw rugs and tripping hazards from the floor. STAIRWAYS  Never leave objects on stairs.  Place handrails on both sides of stairways and use them. Fix any loose handrails. Make sure handrails on both sides of the stairways are as long as the stairs.  Check carpeting to make sure it is firmly attached along stairs. Make repairs to worn or loose carpet promptly.  Avoid placing throw rugs at the top or bottom of stairways, or properly secure the rug with carpet tape to prevent slippage. Get rid of throw rugs, if possible.  Have an electrician put in a light switch at the top and bottom of the stairs. OTHER FALL PREVENTION TIPS  Wear low-heel or rubber-soled shoes that are supportive and fit well. Wear closed toe shoes.  When using a stepladder, make sure it is fully opened and both spreaders are firmly locked. Do not climb a closed stepladder.  Add color or contrast paint or tape to grab bars and handrails in your home. Place contrasting color strips on first and last steps.  Learn and use mobility aids as needed. Install an electrical emergency response system.  Turn on lights to avoid dark areas. Replace light bulbs that burn   out immediately. Get light switches that glow.  Arrange furniture to create clear pathways. Keep furniture in the same place.  Firmly attach carpet with non-skid or double-sided tape.  Eliminate uneven floor surfaces.  Select a carpet pattern that does not visually hide the edge of steps.  Be aware of all pets. OTHER HOME SAFETY TIPS  Set the water temperature for 120 F (48.8 C).  Keep emergency numbers on or near the telephone.  Keep smoke detectors on every level of the home and near sleeping areas. Document Released: 04/09/2002 Document Revised: 10/19/2011  Document Reviewed: 07/09/2011 ExitCare Patient Information 2015 [employer]. This information is not intended to replace advice given to you by your health care provider. Make sure you discuss any questions you have with your health care provider.   Preventive Care for Adults Ages 65 and over  Blood pressure check.** / Every 1 to 2 years.  Lipid and cholesterol check.**/ Every 5 years beginning at age 20.  Lung cancer screening. / Every year if you are aged 55-80 years and have a 30-pack-year history of smoking and currently smoke or have quit within the past 15 years. Yearly screening is stopped once you have quit smoking for at least 15 years or develop a health problem that would prevent you from having lung cancer treatment.  Fecal occult blood test (FOBT) of stool. / Every year beginning at age 50 and continuing until age 75. You may not have to do this test if you get a colonoscopy every 10 years.  Flexible sigmoidoscopy** or colonoscopy.** / Every 5 years for a flexible sigmoidoscopy or every 10 years for a colonoscopy beginning at age 50 and continuing until age 75.  Hepatitis C blood test.** / For all people born from 1945 through 1965 and any individual with known risks for hepatitis C.  Abdominal aortic aneurysm (AAA) screening.** / A one-time screening for ages 65 to 75 years who are current or former smokers.  Skin self-exam. / Monthly.  Influenza vaccine. / Every year.  Tetanus, diphtheria, and acellular pertussis (Tdap/Td) vaccine.** / 1 dose of Td every 10 years.  Varicella vaccine.** / Consult your health care provider.  Zoster vaccine.** / 1 dose for adults aged 60 years or older.  Pneumococcal 13-valent conjugate (PCV13) vaccine.** / Consult your health care provider.  Pneumococcal polysaccharide (PPSV23) vaccine.** / 1 dose for all adults aged 65 years and older.  Meningococcal vaccine.** / Consult your health care provider.  Hepatitis A vaccine.** /  Consult your health care provider.  Hepatitis B vaccine.** / Consult your health care provider.  Haemophilus influenzae type b (Hib) vaccine.** / Consult your health care provider. **Family history and personal history of risk and conditions may change your health care provider's recommendations. Document Released: 06/15/2001 Document Revised: 04/24/2013 Document Reviewed: 09/14/2010 ExitCare Patient Information 2015 [employer]. This information is not intended to replace advice given to you by your health care provider. Make sure you discuss any questions you have with your health care provider.   

## 2015-10-08 NOTE — Progress Notes (Signed)
Pre visit review using our clinic review tool, if applicable. No additional management support is needed unless otherwise documented below in the visit note. 

## 2015-10-08 NOTE — Assessment & Plan Note (Signed)
Td   2013; pneumonia shot 2006; prevnar -- 2016 shingles shot: had already  cscope 01-2005, had tics, pt electing no further screening cscopes or hemoccults  status post prostatectomy for prostate cancer around 2000, discharge from urology 2010, Rx yearly PSAs ---pt likes to cont checking, will get a psa  today   counseled: continue with his healthy life style , has a healthcare power of attorney, fall prevention discussed

## 2015-10-08 NOTE — Progress Notes (Signed)
Subjective:    Patient ID: Brandon Burke, male    DOB: [birth year], 82 y.o.   MRN: [id number]  DOS:  10/08/2015 Type of visit - description : CPX Interval history: In general feeling well   Review of Systems Constitutional: No fever. No chills. No unexplained wt changes. No unusual sweats. Some days he feels fatigue in the afternoon, takes a nap and feels better occ  insomnia, better with Tylenol PM sporadically  HEENT: No dental problems, no ear discharge, occasional ear itching,  no facial swelling, no voice changes. No eye discharge, no eye  redness , no  intolerance to light   Respiratory: No wheezing , no  difficulty breathing. No cough , no mucus production  Cardiovascular: No CP, no leg swelling , no  Palpitations  GI: no nausea, no vomiting, no diarrhea , no  abdominal pain.  No blood in the stools. No dysphagia, no odynophagia    Endocrine: No polyphagia, no polyuria , no polydipsia  GU: No dysuria, gross hematuria, difficulty urinating. No urinary urgency, no frequency.  Musculoskeletal: No joint swellings or unusual aches or pains  Skin: No change in the color of the skin, palor , no  Rash  Allergic, immunologic: No environmental allergies , no  food allergies  Neurological: No dizziness no  syncope. No headaches. No diplopia, no slurred, no slurred speech, no motor deficits, no facial  Numbness  Hematological: No enlarged lymph nodes, no easy bruising , no unusual bleedings  Psychiatry: No suicidal ideas, no hallucinations, no beavior problems, no confusion.  No unusual/severe anxiety, no depression   Past Medical History  Diagnosis Date  . Mitral valve prolapse   . PE (pulmonary embolism)     5-09:was referred to hematology, and they recommend to discontinue Coumadin 5/11  . DVT (deep venous thrombosis) ([place])     hx of 2001 (after prostate surgery)  . Hyperlipidemia   . Prostate cancer [facility])      released from urology in 2010,needs yearly PSAs with  PCP    Past Surgical History  Procedure Laterality Date  . Prostatectomy  2000    Social History   Social History  . Marital Status: Married    Spouse Name: N/A  . Number of Children: 0  . Years of Education: N/A   Occupational History  . Retired    Social History Main Topics  . Smoking status: Never Smoker   . Smokeless tobacco: Never Used  . Alcohol Use: No  . Drug Use: No  . Sexual Activity: Not on file   Other Topics Concern  . Not on file   Social History Narrative   Married, wife w/ melanoma, advanced     no children      Family History  Problem Relation Age of Onset  . CAD Father 61  . Diabetes Neg Hx   . Colon cancer Neg Hx   . Prostate cancer Neg Hx        Medication List       This list is accurate as of: 10/08/15 11:59 PM.  Always use your most recent med list.               ARTIFICIAL TEARS OP  Apply to eye daily as needed.     aspirin 81 MG tablet  Take 162 mg by mouth daily.     atenolol 50 MG tablet  Commonly known as:  TENORMIN  Take 1 tablet (50 mg total) by mouth daily.  diphenhydramine-acetaminophen 25-500 MG Tabs tablet  Commonly known as:  TYLENOL PM  Take 1 tablet by mouth at bedtime as needed.     erythromycin ophthalmic ointment  1 application at bedtime.     fluticasone 50 MCG/ACT nasal spray  Commonly known as:  FLONASE  Place 1 spray into both nostrils daily as needed for allergies or rhinitis.     folic acid A999333 MCG tablet  Commonly known as:  FOLVITE  Take 400 mcg by mouth daily.     latanoprost 0.005 % ophthalmic solution  Commonly known as:  XALATAN  Place 1 drop into both eyes at bedtime. Reported on 10/08/2015     multivitamin,tx-minerals tablet  Take 1 tablet by mouth daily.     polyethylene glycol packet  Commonly known as:  MIRALAX / GLYCOLAX  Take 17 g by mouth daily.     pravastatin 40 MG tablet  Commonly known as:  PRAVACHOL  Take 1 tablet (40 mg total) by mouth daily.     travoprost  (benzalkonium) 0.004 % ophthalmic solution  Commonly known as:  TRAVATAN  Place 1 drop into both eyes at bedtime.           Objective:   Physical Exam BP 118/76 mmHg  Pulse 62  Temp(Src) 97.8 F (36.6 C) (Oral)  Ht 6' (1.829 m)  Wt 162 lb 6 oz (73.653 kg)  BMI 22.02 kg/m2  SpO2 96%  General:   Well developed, well nourished . NAD.  Neck: No  thyromegaly  HEENT:  Normocephalic . Face symmetric, atraumatic. Ears: TMs and ear canals normal Lungs:  CTA B Normal respiratory effort, no intercostal retractions, no accessory muscle use. Heart: RRR,  no murmur.  No pretibial edema bilaterally  Abdomen:  Not distended, soft, non-tender. No rebound or rigidity.   Skin: Exposed areas without rash. Not pale. Not jaundice Neurologic:  alert & oriented X3.  Speech normal, gait appropriate for age and unassisted Strength symmetric and appropriate for age.  Psych: Cognition and judgment appear intact.  Cooperative with normal attention span and concentration.  Behavior appropriate. No anxious or depressed appearing.    Assessment & Plan:   Assessment Hyperlipidemia Prostate cancer, released from urology 2010, Rx yearly PSAs by PCP HEM:DVT 2001 after surgery, PE 2009, eval by hematology, they recommend DC Coumadin 2011 H/o MVP-- on BB glaucoma Sees dermatology q 6 months   PLAN Hyperlipidemia: Good compliance w/ medication, check FLP, AST, ALT (he is not fasting) History of prostate cancer: Check a PSA MVP: On beta blockers, no murmur Chronic fatigue: ROS (-) for red flag sx, Rx observation for now. RTC 1 year

## 2015-10-09 DIAGNOSIS — Z09 Encounter for follow-up examination after completed treatment for conditions other than malignant neoplasm: Secondary | ICD-10-CM | POA: Insufficient documentation

## 2015-10-09 LAB — CBC WITH DIFFERENTIAL/PLATELET
BASOS PCT: 0.4 % (ref 0.0–3.0)
Basophils Absolute: 0 10*3/uL (ref 0.0–0.1)
EOS ABS: 0.2 10*3/uL (ref 0.0–0.7)
Eosinophils Relative: 4.1 % (ref 0.0–5.0)
HEMATOCRIT: 43.8 % (ref 39.0–52.0)
HEMOGLOBIN: 14.9 g/dL (ref 13.0–17.0)
LYMPHS PCT: 18.4 % (ref 12.0–46.0)
Lymphs Abs: 0.9 10*3/uL (ref 0.7–4.0)
MCHC: 34 g/dL (ref 30.0–36.0)
MCV: 96.7 fl (ref 78.0–100.0)
MONO ABS: 0.6 10*3/uL (ref 0.1–1.0)
Monocytes Relative: 12 % (ref 3.0–12.0)
Neutro Abs: 3.1 10*3/uL (ref 1.4–7.7)
Neutrophils Relative %: 65.1 % (ref 43.0–77.0)
Platelets: 147 10*3/uL — ABNORMAL LOW (ref 150.0–400.0)
RBC: 4.53 Mil/uL (ref 4.22–5.81)
RDW: 12.6 % (ref 11.5–15.5)
WBC: 4.7 10*3/uL (ref 4.0–10.5)

## 2015-10-09 LAB — LIPID PANEL
CHOLESTEROL: 132 mg/dL (ref 0–200)
HDL: 33.7 mg/dL — ABNORMAL LOW (ref >39.00)
LDL Cholesterol: 68 mg/dL (ref 0–99)
NonHDL: 98.18
TRIGLYCERIDES: 149 mg/dL (ref 0.0–149.0)
Total CHOL/HDL Ratio: 4
VLDL: 29.8 mg/dL (ref 0.0–40.0)

## 2015-10-09 LAB — BASIC METABOLIC PANEL
BUN: 20 mg/dL (ref 6–23)
CHLORIDE: 106 meq/L (ref 96–112)
CO2: 24 mEq/L (ref 19–32)
CREATININE: 1.1 mg/dL (ref 0.40–1.50)
Calcium: 9.5 mg/dL (ref 8.4–10.5)
GFR: 68.15 mL/min (ref >60.00)
Glucose, Bld: 87 mg/dL (ref 70–99)
Potassium: 4.2 mEq/L (ref 3.5–5.1)
Sodium: 141 mEq/L (ref 135–145)

## 2015-10-09 LAB — ALT: ALT: 14 U/L (ref 0–53)

## 2015-10-09 LAB — AST: AST: 19 U/L (ref 0–37)

## 2015-10-09 LAB — PSA: PSA: 0 ng/mL — ABNORMAL LOW (ref 0.10–4.00)

## 2015-10-09 NOTE — Assessment & Plan Note (Signed)
Hyperlipidemia: Good compliance w/ medication, check FLP, AST, ALT (he is not fasting) History of prostate cancer: Check a PSA MVP: On beta blockers, no murmur Chronic fatigue: ROS (-) for red flag sx, Rx observation for now. RTC 1 year

## 2015-10-28 DIAGNOSIS — H43812 Vitreous degeneration, left eye: Secondary | ICD-10-CM | POA: Diagnosis not present

## 2015-10-28 DIAGNOSIS — H401131 Primary open-angle glaucoma, bilateral, mild stage: Secondary | ICD-10-CM | POA: Diagnosis not present

## 2015-10-28 DIAGNOSIS — H02055 Trichiasis without entropian left lower eyelid: Secondary | ICD-10-CM | POA: Diagnosis not present

## 2015-10-28 DIAGNOSIS — H04123 Dry eye syndrome of bilateral lacrimal glands: Secondary | ICD-10-CM | POA: Diagnosis not present

## 2015-11-20 DIAGNOSIS — H401131 Primary open-angle glaucoma, bilateral, mild stage: Secondary | ICD-10-CM | POA: Diagnosis not present

## 2015-12-17 DIAGNOSIS — H02055 Trichiasis without entropian left lower eyelid: Secondary | ICD-10-CM | POA: Diagnosis not present

## 2015-12-22 ENCOUNTER — Telehealth: Payer: Self-pay | Admitting: Internal Medicine

## 2015-12-22 NOTE — Telephone Encounter (Signed)
Pt dropped off a medical form for Dr. Larose Kells to fill out, pt will pick up when ready, documents placed in tray at front office

## 2015-12-25 NOTE — Telephone Encounter (Signed)
Have not seen form, will forward to [place].

## 2015-12-25 NOTE — Telephone Encounter (Signed)
Please call patient when forms are ready for pick up.

## 2015-12-26 NOTE — Telephone Encounter (Signed)
Paperwork received and completed.  Called and left detailed message on voice mail that paper work is ready for pick up.

## 2015-12-29 NOTE — Telephone Encounter (Signed)
Copies of paperwork sent for scanning.

## 2016-01-04 ENCOUNTER — Other Ambulatory Visit: Payer: Self-pay | Admitting: Internal Medicine

## 2016-01-29 DIAGNOSIS — H0232 Blepharochalasis right lower eyelid: Secondary | ICD-10-CM | POA: Diagnosis not present

## 2016-01-29 DIAGNOSIS — H02055 Trichiasis without entropian left lower eyelid: Secondary | ICD-10-CM | POA: Diagnosis not present

## 2016-01-29 DIAGNOSIS — H0231 Blepharochalasis right upper eyelid: Secondary | ICD-10-CM | POA: Diagnosis not present

## 2016-01-29 DIAGNOSIS — H02054 Trichiasis without entropian left upper eyelid: Secondary | ICD-10-CM | POA: Diagnosis not present

## 2016-02-13 ENCOUNTER — Telehealth: Payer: Self-pay | Admitting: Internal Medicine

## 2016-02-13 MED ORDER — CYCLOBENZAPRINE HCL 10 MG PO TABS: 10.0000 mg | ORAL_TABLET | Freq: Every evening | ORAL | 1 refills | Status: DC | PRN

## 2016-02-13 NOTE — Telephone Encounter (Signed)
Med no longer on med list. Please advise.

## 2016-02-13 NOTE — Telephone Encounter (Signed)
Patient is calling for a refill of Flexeril. Please advise.    Patient relation: self Patient phone: [phone] [facility]acy: [facility]  [address], [address] [place], [place], [place] [address]

## 2016-02-13 NOTE — Telephone Encounter (Signed)
Used to take it, ask pt  for what condition he uses it but is okay to refill, one tablet daily as needed #30 one refill

## 2016-02-13 NOTE — Telephone Encounter (Signed)
Spoke w/ Pt, he uses Flexeril PRN for back pain. Rx sent to [facility].

## 2016-03-23 DIAGNOSIS — H02055 Trichiasis without entropian left lower eyelid: Secondary | ICD-10-CM | POA: Diagnosis not present

## 2016-04-02 DIAGNOSIS — D1801 Hemangioma of skin and subcutaneous tissue: Secondary | ICD-10-CM | POA: Diagnosis not present

## 2016-04-02 DIAGNOSIS — L570 Actinic keratosis: Secondary | ICD-10-CM | POA: Diagnosis not present

## 2016-04-02 DIAGNOSIS — L821 Other seborrheic keratosis: Secondary | ICD-10-CM | POA: Diagnosis not present

## 2016-04-02 DIAGNOSIS — Z85828 Personal history of other malignant neoplasm of skin: Secondary | ICD-10-CM | POA: Diagnosis not present

## 2016-04-20 DIAGNOSIS — H02055 Trichiasis without entropian left lower eyelid: Secondary | ICD-10-CM | POA: Diagnosis not present

## 2016-04-20 DIAGNOSIS — H04123 Dry eye syndrome of bilateral lacrimal glands: Secondary | ICD-10-CM | POA: Diagnosis not present

## 2016-04-20 DIAGNOSIS — H401131 Primary open-angle glaucoma, bilateral, mild stage: Secondary | ICD-10-CM | POA: Diagnosis not present

## 2016-06-04 ENCOUNTER — Telehealth: Payer: Self-pay | Admitting: Internal Medicine

## 2016-06-04 NOTE — Telephone Encounter (Signed)
Called patient to schedule awv. Left msg for patient to call office and  schedule awv with health coach.

## 2016-06-04 NOTE — Telephone Encounter (Signed)
Patient called office regarding awv. Patient had questions about awv. Explained what an awv is. Patient declined to have awv at this time, but wanted to schedule cpe wit provider. Scheduled cpe in May 2018.

## 2016-06-10 DIAGNOSIS — H401131 Primary open-angle glaucoma, bilateral, mild stage: Secondary | ICD-10-CM | POA: Diagnosis not present

## 2016-06-10 DIAGNOSIS — H02055 Trichiasis without entropian left lower eyelid: Secondary | ICD-10-CM | POA: Diagnosis not present

## 2016-06-10 DIAGNOSIS — H04123 Dry eye syndrome of bilateral lacrimal glands: Secondary | ICD-10-CM | POA: Diagnosis not present

## 2016-06-21 DIAGNOSIS — S0502XA Injury of conjunctiva and corneal abrasion without foreign body, left eye, initial encounter: Secondary | ICD-10-CM | POA: Diagnosis not present

## 2016-08-06 DIAGNOSIS — Z23 Encounter for immunization: Secondary | ICD-10-CM | POA: Diagnosis not present

## 2016-08-23 DIAGNOSIS — H02055 Trichiasis without entropian left lower eyelid: Secondary | ICD-10-CM | POA: Diagnosis not present

## 2016-09-17 DIAGNOSIS — L821 Other seborrheic keratosis: Secondary | ICD-10-CM | POA: Diagnosis not present

## 2016-09-17 DIAGNOSIS — L570 Actinic keratosis: Secondary | ICD-10-CM | POA: Diagnosis not present

## 2016-09-17 DIAGNOSIS — Z85828 Personal history of other malignant neoplasm of skin: Secondary | ICD-10-CM | POA: Diagnosis not present

## 2016-09-21 DIAGNOSIS — H16141 Punctate keratitis, right eye: Secondary | ICD-10-CM | POA: Diagnosis not present

## 2016-09-22 ENCOUNTER — Ambulatory Visit (INDEPENDENT_AMBULATORY_CARE_PROVIDER_SITE_OTHER): Payer: Medicare Other | Admitting: Internal Medicine

## 2016-09-22 ENCOUNTER — Encounter: Payer: Self-pay | Admitting: Internal Medicine

## 2016-09-22 VITALS — BP 118/60 | HR 58 | Temp 98.1°F | Resp 14 | Ht 72.0 in | Wt 160.2 lb

## 2016-09-22 DIAGNOSIS — Z23 Encounter for immunization: Secondary | ICD-10-CM

## 2016-09-22 DIAGNOSIS — Z0000 Encounter for general adult medical examination without abnormal findings: Secondary | ICD-10-CM

## 2016-09-22 NOTE — Progress Notes (Signed)
Pre visit review using our clinic review tool, if applicable. No additional management support is needed unless otherwise documented below in the visit note. 

## 2016-09-22 NOTE — Assessment & Plan Note (Addendum)
--  Td   2013; pneumonia shot2006, booster 08-2016; prevnar -- 2016; s/p zostavax; had shingrex #1 @ pharmacy, will get #2 --CCS: scope 01-2005, had tics, pt electing no further screening   --S/P prostatectomy for prostate cancer around 2000, discharge from urology 2010, Rx yearly PSAs;  likes to cont checking, will get a psa  today --counseled: Diet, exercise. --Labs: CMP, FLP, CBC, TSH, PSA

## 2016-09-22 NOTE — Progress Notes (Signed)
Subjective:    Patient ID: Brandon Burke, male    DOB: January 20, 1932, 83 y.o.   MRN: [id number]  DOS:  09/22/2016 Type of visit - description : CPX Interval history: In general feeling well. Does have some GI issues. See below   Review of Systems  He is usually very regular, takes MiraLAX daily.  ~ 4 weeks ago developed episodes of constipation despite MiraLAX, after 3 or 4 days he decided to take a laxative and developed "explosive BMs" (loose stools mixed with gas), few episodes a day. He developed constipation again and took laxatives, then had more "explosive" BMs. He had this "cycle" 3 or 4 times. He has been more regular for the last 5 days. Denies fever, chills. No weight loss No blood in the stools, nausea or vomiting. No actual abdominal pain, he does have cramps with BMs.   Other than above, a 14 point review of systems is negative    Past Medical History:  Diagnosis Date  . DVT (deep venous thrombosis) ([place])    hx of 2001 (after prostate surgery)  . Hyperlipidemia   . Mitral valve prolapse   . PE (pulmonary embolism)    5-09:was referred to hematology, and they recommend to discontinue Coumadin 5/11  . Prostate cancer [facility])     released from urology in 2010,needs yearly PSAs with PCP    Past Surgical History:  Procedure Laterality Date  . PROSTATECTOMY  2000    Social History   Social History  . Marital status: Married    Spouse name: N/A  . Number of children: 0  . Years of education: N/A   Occupational History  . Retired Retired   Social History Main Topics  . Smoking status: Never Smoker  . Smokeless tobacco: Never Used  . Alcohol use No  . Drug use: No  . Sexual activity: Not on file   Other Topics Concern  . Not on file   Social History Narrative   Married, wife w/ melanoma, advanced, doing well as off 08-2016     no children    Moved to [facility] [place]  [place]     Family History    Problem Relation Age of Onset  . CAD Father 25  . Diabetes Neg Hx   . Colon cancer Neg Hx   . Prostate cancer Neg Hx      Allergies as of 09/22/2016      Reactions   Nitrofuran Derivatives    headache   Sulfonamide Derivatives Nausea And Vomiting      Medication List       Accurate as of 09/22/16 11:59 PM. Always use your most recent med list.          ARTIFICIAL TEARS OP Apply to eye daily as needed.   aspirin 81 MG tablet Take 162 mg by mouth daily.   atenolol 50 MG tablet Commonly known as:  TENORMIN Take 1 tablet (50 mg total) by mouth daily.   cyclobenzaprine 10 MG tablet Commonly known as:  FLEXERIL Take 1 tablet (10 mg total) by mouth at bedtime as needed for muscle spasms.   diphenhydramine-acetaminophen 25-500 MG Tabs tablet Commonly known as:  TYLENOL PM Take 1 tablet by mouth at bedtime as needed.   erythromycin ophthalmic ointment 1 application at bedtime.   fluticasone 50 MCG/ACT nasal spray Commonly known as:  FLONASE Place 1 spray into both nostrils daily as needed for allergies or  rhinitis.   folic acid 893 MCG tablet Commonly known as:  FOLVITE Take 400 mcg by mouth daily.   latanoprost 0.005 % ophthalmic solution Commonly known as:  XALATAN Place 1 drop into both eyes at bedtime. Reported on 10/08/2015   multivitamin,tx-minerals tablet Take 1 tablet by mouth daily.   polyethylene glycol packet Commonly known as:  MIRALAX / GLYCOLAX Take 17 g by mouth daily.   pravastatin 40 MG tablet Commonly known as:  PRAVACHOL Take 1 tablet (40 mg total) by mouth daily.          Objective:   Physical Exam BP 118/60 (BP Location: Right Arm, Patient Position: Sitting, Cuff Size: Small)   Pulse (!) 58   Temp 98.1 F (36.7 C) (Oral)   Resp 14   Ht 6' (1.829 m)   Wt 160 lb 4 oz (72.7 kg)   SpO2 96%   BMI 21.73 kg/m   General:   Well developed, well nourished . NAD.  Neck: No  thyromegaly  HEENT:  Normocephalic . Face symmetric,  atraumatic Lungs:  CTA B Normal respiratory effort, no intercostal retractions, no accessory muscle use. Heart: RRR,  no murmur.  No pretibial edema bilaterally  Abdomen:  Not distended, soft, non-tender. No rebound or rigidity.   Skin: Exposed areas without rash. Not pale. Not jaundice Neurologic:  alert & oriented X3.  Speech normal, gait appropriate for age and unassisted Strength symmetric and appropriate for age.  Psych: Cognition and judgment appear intact.  Cooperative with normal attention span and concentration.  Behavior appropriate. No anxious or depressed appearing.    Assessment & Plan:    Assessment Hyperlipidemia Prostate cancer, released from urology 2010, Rx yearly PSAs by PCP HEM:DVT 2001 after surgery, PE 2009, eval by hematology, they recommend DC Coumadin 2011 H/o MVP-- on BB glaucoma Sees dermatology q 6 months   PLAN Hyperlipidemia: Continue Pravachol, checking labs Prostate cancer: Asx. Checking a PSA Sees dermatology regularly Diarrhea constipation: See ROS, sxs started 4 weeks ago, the last 5 days sxs have decreased. Recommend probiotics, healthy diet. Restart MiraLAX daily. Will need further eval if he has anemia   or if symptoms continue. (Had a colonoscopy 01-2005, normal except for diverticuli).  Takes Flexeril for aches and pains occasionally  RTC one year

## 2016-09-22 NOTE — Patient Instructions (Signed)
  GO TO THE FRONT DESK Schedule labs to be done within few days, fasting  Schedule your next appointment for a  physical exam in one year  Also consider Medicare wellness with one of our registered nurses  Go back on MiraLAX daily  Start a probiotic OTC to help your stomach (ALIGN)  If you continue to have diarrhea and constipation: Please let me know

## 2016-09-23 NOTE — Assessment & Plan Note (Addendum)
Hyperlipidemia: Continue Pravachol, checking labs Prostate cancer: Asx. Checking a PSA Sees dermatology regularly Diarrhea constipation: See ROS, sxs started 4 weeks ago, the last 5 days sxs have decreased. Recommend probiotics, healthy diet. Restart MiraLAX daily. Will need further eval if he has anemia   or if symptoms continue. (Had a colonoscopy 01-2005, normal except for diverticuli).  Takes Flexeril for aches and pains occasionally  RTC one year

## 2016-09-28 ENCOUNTER — Other Ambulatory Visit (INDEPENDENT_AMBULATORY_CARE_PROVIDER_SITE_OTHER): Payer: Medicare Other

## 2016-09-28 DIAGNOSIS — Z0000 Encounter for general adult medical examination without abnormal findings: Secondary | ICD-10-CM

## 2016-09-28 DIAGNOSIS — H401131 Primary open-angle glaucoma, bilateral, mild stage: Secondary | ICD-10-CM | POA: Diagnosis not present

## 2016-09-28 DIAGNOSIS — H04123 Dry eye syndrome of bilateral lacrimal glands: Secondary | ICD-10-CM | POA: Diagnosis not present

## 2016-09-28 DIAGNOSIS — H02055 Trichiasis without entropian left lower eyelid: Secondary | ICD-10-CM | POA: Diagnosis not present

## 2016-09-28 LAB — CBC WITH DIFFERENTIAL/PLATELET
Basophils Absolute: 0 10*3/uL (ref 0.0–0.1)
Basophils Relative: 0.7 % (ref 0.0–3.0)
EOS ABS: 0.2 10*3/uL (ref 0.0–0.7)
Eosinophils Relative: 3.4 % (ref 0.0–5.0)
HEMATOCRIT: 44 % (ref 39.0–52.0)
Hemoglobin: 15 g/dL (ref 13.0–17.0)
LYMPHS ABS: 1.1 10*3/uL (ref 0.7–4.0)
Lymphocytes Relative: 20.8 % (ref 12.0–46.0)
MCHC: 34.2 g/dL (ref 30.0–36.0)
MCV: 96.7 fl (ref 78.0–100.0)
MONOS PCT: 10.3 % (ref 3.0–12.0)
Monocytes Absolute: 0.6 10*3/uL (ref 0.1–1.0)
Neutro Abs: 3.5 10*3/uL (ref 1.4–7.7)
Neutrophils Relative %: 64.8 % (ref 43.0–77.0)
PLATELETS: 152 10*3/uL (ref 150.0–400.0)
RBC: 4.55 Mil/uL (ref 4.22–5.81)
RDW: 12.9 % (ref 11.5–15.5)
WBC: 5.4 10*3/uL (ref 4.0–10.5)

## 2016-09-28 LAB — COMPREHENSIVE METABOLIC PANEL
ALBUMIN: 4.2 g/dL (ref 3.5–5.2)
ALT: 21 U/L (ref 0–53)
AST: 18 U/L (ref 0–37)
Alkaline Phosphatase: 48 U/L (ref 39–117)
BUN: 15 mg/dL (ref 6–23)
CALCIUM: 9.5 mg/dL (ref 8.4–10.5)
CHLORIDE: 107 meq/L (ref 96–112)
CO2: 27 meq/L (ref 19–32)
Creatinine, Ser: 1.05 mg/dL (ref 0.40–1.50)
GFR: 71.73 mL/min (ref >60.00)
Glucose, Bld: 96 mg/dL (ref 70–99)
Potassium: 4.3 mEq/L (ref 3.5–5.1)
Sodium: 141 mEq/L (ref 135–145)
Total Bilirubin: 0.5 mg/dL (ref 0.2–1.2)
Total Protein: 6.8 g/dL (ref 6.0–8.3)

## 2016-09-28 LAB — LIPID PANEL
CHOL/HDL RATIO: 4
Cholesterol: 144 mg/dL (ref 0–200)
HDL: 34.7 mg/dL — ABNORMAL LOW (ref >39.00)
LDL CALC: 79 mg/dL (ref 0–99)
NonHDL: 109.36
TRIGLYCERIDES: 151 mg/dL — AB (ref 0.0–149.0)
VLDL: 30.2 mg/dL (ref 0.0–40.0)

## 2016-09-28 LAB — PSA: PSA: 0 ng/mL — ABNORMAL LOW (ref 0.10–4.00)

## 2016-09-28 LAB — TSH: TSH: 4.17 u[IU]/mL (ref 0.35–4.50)

## 2016-10-22 DIAGNOSIS — H02055 Trichiasis without entropian left lower eyelid: Secondary | ICD-10-CM | POA: Diagnosis not present

## 2016-10-22 DIAGNOSIS — H04123 Dry eye syndrome of bilateral lacrimal glands: Secondary | ICD-10-CM | POA: Diagnosis not present

## 2016-10-22 DIAGNOSIS — H401131 Primary open-angle glaucoma, bilateral, mild stage: Secondary | ICD-10-CM | POA: Diagnosis not present

## 2016-10-23 ENCOUNTER — Other Ambulatory Visit: Payer: Self-pay | Admitting: Internal Medicine

## 2016-11-01 DIAGNOSIS — Z23 Encounter for immunization: Secondary | ICD-10-CM | POA: Diagnosis not present

## 2016-11-29 ENCOUNTER — Ambulatory Visit (INDEPENDENT_AMBULATORY_CARE_PROVIDER_SITE_OTHER): Payer: Medicare Other | Admitting: Internal Medicine

## 2016-11-29 ENCOUNTER — Encounter: Payer: Self-pay | Admitting: Internal Medicine

## 2016-11-29 ENCOUNTER — Telehealth: Payer: Self-pay | Admitting: Internal Medicine

## 2016-11-29 ENCOUNTER — Ambulatory Visit (HOSPITAL_BASED_OUTPATIENT_CLINIC_OR_DEPARTMENT_OTHER)
Admission: RE | Admit: 2016-11-29 | Discharge: 2016-11-29 | Disposition: A | Discharge status: Home / Self Care | Payer: Medicare Other | Source: Ambulatory Visit | Attending: Internal Medicine | Admitting: Internal Medicine

## 2016-11-29 VITALS — BP 128/68 | HR 60 | Temp 97.6°F | Resp 14 | Ht 72.0 in | Wt 165.0 lb

## 2016-11-29 DIAGNOSIS — M79661 Pain in right lower leg: Secondary | ICD-10-CM | POA: Diagnosis not present

## 2016-11-29 NOTE — Patient Instructions (Addendum)
Please get your ultrasound done today to rule out a DVT.  Tylenol as needed  Call if you are not gradually better, also if severe pain, redness, swelling.

## 2016-11-29 NOTE — Assessment & Plan Note (Signed)
Right calf pain, mild swelling. He has a history of PE/DVT remotely, no other risk factors. To be sure will check ultrasound to rule out DVT. Otherwise treat as a MSK issue. Vascular exam normal. Addendum: Ultrasound negative for DVT

## 2016-11-29 NOTE — Progress Notes (Signed)
Pre visit review using our clinic review tool, if applicable. No additional management support is needed unless otherwise documented below in the visit note. 

## 2016-11-29 NOTE — Progress Notes (Signed)
Subjective:    Patient ID: Brandon Burke, male    DOB: [birth year], 83 y.o.   MRN: [id number]  DOS:  11/29/2016 Type of visit - description : acute Interval history: Woke up this morning with pain at the right calf, pain is steady, intensity changes up and down but not severe. Not particularly painful when he walks or moves his leg.    Review of Systems Denies redness, no swelling, no injury or overdoing. No back pain or radicular symptoms No fever chills No chest pain or difficulty breathing No prolonged car trips or airplane trips.  Past Medical History:  Diagnosis Date  . DVT (deep venous thrombosis) ([place])    hx of 2001 (after prostate surgery)  . Hyperlipidemia   . Mitral valve prolapse   . PE (pulmonary embolism)    5-09:was referred to hematology, and they recommend to discontinue Coumadin 5/11  . Prostate cancer [facility])     released from urology in 2010,needs yearly PSAs with PCP    Past Surgical History:  Procedure Laterality Date  . PROSTATECTOMY  2000    Social History   Social History  . Marital status: Married    Spouse name: N/A  . Number of children: 0  . Years of education: N/A   Occupational History  . Retired Retired   Social History Main Topics  . Smoking status: Never Smoker  . Smokeless tobacco: Never Used  . Alcohol use No  . Drug use: No  . Sexual activity: Not on file   Other Topics Concern  . Not on file   Social History Narrative   Married, wife w/ melanoma, advanced, doing well as off 08-2016     no children    Moved to [facility] 03-2016 (  at the independent area as off 08-2016)      Allergies as of 11/29/2016      Reactions   Nitrofuran Derivatives    headache   Sulfonamide Derivatives Nausea And Vomiting      Medication List       Accurate as of 11/29/16  9:34 PM. Always use your most recent med list.          ARTIFICIAL TEARS OP Apply to eye daily as needed.   aspirin 81 MG tablet Take 162 mg by  mouth daily.   atenolol 50 MG tablet Commonly known as:  TENORMIN Take 1 tablet (50 mg total) by mouth daily.   cyclobenzaprine 10 MG tablet Commonly known as:  FLEXERIL Take 1 tablet (10 mg total) by mouth at bedtime as needed for muscle spasms.   diphenhydramine-acetaminophen 25-500 MG Tabs tablet Commonly known as:  TYLENOL PM Take 1 tablet by mouth at bedtime as needed.   erythromycin ophthalmic ointment 1 application at bedtime.   fluticasone 50 MCG/ACT nasal spray Commonly known as:  FLONASE Place 1 spray into both nostrils daily as needed for allergies or rhinitis.   folic acid 259 MCG tablet Commonly known as:  FOLVITE Take 400 mcg by mouth daily.   latanoprost 0.005 % ophthalmic solution Commonly known as:  XALATAN Place 1 drop into both eyes at bedtime. Reported on 10/08/2015   multivitamin,tx-minerals tablet Take 1 tablet by mouth daily.   polyethylene glycol packet Commonly known as:  MIRALAX / GLYCOLAX Take 17 g by mouth daily.   pravastatin 40 MG tablet Commonly known as:  PRAVACHOL Take 1 tablet (40 mg total) by mouth daily.  Objective:   Physical Exam BP 128/68 (BP Location: Left Arm, Patient Position: Sitting, Cuff Size: Small)   Pulse 60   Temp 97.6 F (36.4 C) (Oral)   Resp 14   Ht 6' (1.829 m)   Wt 165 lb (74.8 kg)   SpO2 96%   BMI 22.38 kg/m  General:   Well developed, well nourished . NAD.  HEENT:  Normocephalic . Face symmetric, atraumatic Lungs:  CTA B Normal respiratory effort, no intercostal retractions, no accessory muscle use. Heart: RRR,  no murmur.  No pretibial edema bilaterally  MSK: No TTP at the back Lower extremities: Good pedal pulses, calves are measured, right one is 1 inch larger in circumference. Not TTP. Calf is not red Skin: Not pale. Not jaundice Neurologic:  alert & oriented X3.  Speech normal, gait appropriate for age and unassisted Psych--  Cognition and judgment appear intact.  Cooperative  with normal attention span and concentration.  Behavior appropriate. No anxious or depressed appearing.      Assessment & Plan:   Assessment Hyperlipidemia Prostate cancer, released from urology 2010, Rx yearly PSAs by PCP HEM:DVT 2001 after surgery, PE 2009, eval by hematology, they recommend DC Coumadin 2011 H/o MVP-- on BB glaucoma Sees dermatology q 6 months   PLAN Right calf pain, mild swelling. He has a history of PE/DVT remotely, no other risk factors. To be sure will check ultrasound to rule out DVT. Otherwise treat as a MSK issue. Vascular exam normal. Addendum: Ultrasound negative for DVT

## 2016-12-08 DIAGNOSIS — H04123 Dry eye syndrome of bilateral lacrimal glands: Secondary | ICD-10-CM | POA: Diagnosis not present

## 2016-12-08 DIAGNOSIS — H01001 Unspecified blepharitis right upper eyelid: Secondary | ICD-10-CM | POA: Diagnosis not present

## 2016-12-08 DIAGNOSIS — H401131 Primary open-angle glaucoma, bilateral, mild stage: Secondary | ICD-10-CM | POA: Diagnosis not present

## 2016-12-08 DIAGNOSIS — H01002 Unspecified blepharitis right lower eyelid: Secondary | ICD-10-CM | POA: Diagnosis not present

## 2016-12-08 DIAGNOSIS — H01004 Unspecified blepharitis left upper eyelid: Secondary | ICD-10-CM | POA: Diagnosis not present

## 2016-12-20 DIAGNOSIS — H01005 Unspecified blepharitis left lower eyelid: Secondary | ICD-10-CM | POA: Diagnosis not present

## 2016-12-20 DIAGNOSIS — H02055 Trichiasis without entropian left lower eyelid: Secondary | ICD-10-CM | POA: Diagnosis not present

## 2016-12-20 DIAGNOSIS — H01004 Unspecified blepharitis left upper eyelid: Secondary | ICD-10-CM | POA: Diagnosis not present

## 2017-01-06 DIAGNOSIS — H01001 Unspecified blepharitis right upper eyelid: Secondary | ICD-10-CM | POA: Diagnosis not present

## 2017-01-06 DIAGNOSIS — H04123 Dry eye syndrome of bilateral lacrimal glands: Secondary | ICD-10-CM | POA: Diagnosis not present

## 2017-01-06 DIAGNOSIS — H01002 Unspecified blepharitis right lower eyelid: Secondary | ICD-10-CM | POA: Diagnosis not present

## 2017-01-06 DIAGNOSIS — H01004 Unspecified blepharitis left upper eyelid: Secondary | ICD-10-CM | POA: Diagnosis not present

## 2017-01-06 DIAGNOSIS — H01005 Unspecified blepharitis left lower eyelid: Secondary | ICD-10-CM | POA: Diagnosis not present

## 2017-01-08 ENCOUNTER — Other Ambulatory Visit: Payer: Self-pay | Admitting: Internal Medicine

## 2017-01-13 DIAGNOSIS — C44229 Squamous cell carcinoma of skin of left ear and external auricular canal: Secondary | ICD-10-CM | POA: Diagnosis not present

## 2017-01-13 DIAGNOSIS — L570 Actinic keratosis: Secondary | ICD-10-CM | POA: Diagnosis not present

## 2017-01-13 DIAGNOSIS — L821 Other seborrheic keratosis: Secondary | ICD-10-CM | POA: Diagnosis not present

## 2017-01-13 DIAGNOSIS — Z85828 Personal history of other malignant neoplasm of skin: Secondary | ICD-10-CM | POA: Diagnosis not present

## 2017-02-03 ENCOUNTER — Encounter: Payer: Self-pay | Admitting: Internal Medicine

## 2017-02-04 DIAGNOSIS — H01001 Unspecified blepharitis right upper eyelid: Secondary | ICD-10-CM | POA: Diagnosis not present

## 2017-02-04 DIAGNOSIS — H01004 Unspecified blepharitis left upper eyelid: Secondary | ICD-10-CM | POA: Diagnosis not present

## 2017-02-04 DIAGNOSIS — H02055 Trichiasis without entropian left lower eyelid: Secondary | ICD-10-CM | POA: Diagnosis not present

## 2017-02-04 DIAGNOSIS — H01002 Unspecified blepharitis right lower eyelid: Secondary | ICD-10-CM | POA: Diagnosis not present

## 2017-04-07 DIAGNOSIS — L570 Actinic keratosis: Secondary | ICD-10-CM | POA: Diagnosis not present

## 2017-04-07 DIAGNOSIS — L812 Freckles: Secondary | ICD-10-CM | POA: Diagnosis not present

## 2017-04-07 DIAGNOSIS — L821 Other seborrheic keratosis: Secondary | ICD-10-CM | POA: Diagnosis not present

## 2017-04-07 DIAGNOSIS — Z85828 Personal history of other malignant neoplasm of skin: Secondary | ICD-10-CM | POA: Diagnosis not present

## 2017-04-07 DIAGNOSIS — D1801 Hemangioma of skin and subcutaneous tissue: Secondary | ICD-10-CM | POA: Diagnosis not present

## 2017-06-10 DIAGNOSIS — H01005 Unspecified blepharitis left lower eyelid: Secondary | ICD-10-CM | POA: Diagnosis not present

## 2017-06-10 DIAGNOSIS — H401131 Primary open-angle glaucoma, bilateral, mild stage: Secondary | ICD-10-CM | POA: Diagnosis not present

## 2017-06-10 DIAGNOSIS — H02055 Trichiasis without entropian left lower eyelid: Secondary | ICD-10-CM | POA: Diagnosis not present

## 2017-07-15 DIAGNOSIS — H01002 Unspecified blepharitis right lower eyelid: Secondary | ICD-10-CM | POA: Diagnosis not present

## 2017-07-15 DIAGNOSIS — H02055 Trichiasis without entropian left lower eyelid: Secondary | ICD-10-CM | POA: Diagnosis not present

## 2017-07-15 DIAGNOSIS — H01004 Unspecified blepharitis left upper eyelid: Secondary | ICD-10-CM | POA: Diagnosis not present

## 2017-07-15 DIAGNOSIS — H01001 Unspecified blepharitis right upper eyelid: Secondary | ICD-10-CM | POA: Diagnosis not present

## 2017-07-16 ENCOUNTER — Other Ambulatory Visit: Payer: Self-pay | Admitting: Internal Medicine

## 2017-07-20 NOTE — Progress Notes (Addendum)
Subjective:   Brandon Burke is a 84 y.o. male who presents for Medicare Annual/Subsequent preventive examination.  Review of Systems: No ROS.  Medicare Wellness Visit. Additional risk factors are reflected in the social history.  Cardiac Risk Factors include: advanced age (>2men, >73 women);dyslipidemia;male gender Sleep patterns: Wakes once to urinate. Takes sleep aid occasionally. Sleeps 7-8 hrs. Home Safety/Smoke Alarms: Feels safe in home. Smoke alarms in place.  Living environment; residence and Firearm Safety: Lives at retirement home. Emergency pull strings in apt.  Seat Belt Safety/Bike Helmet: Wears seat belt. Eye-Dr.Digby every 6 weeks.   Male:   CCS- No longer doing routine screening due to age.    PSA-  Lab Results  Component Value Date   PSA 0.00 (L) 09/28/2016   PSA 0.00 Repeated and verified X2. (L) 10/08/2015   PSA 0.01 (L) 08/29/2014       Objective:    Vitals: BP 138/76 (BP Location: Left Arm, Patient Position: Sitting, Cuff Size: Normal)   Pulse 64   Ht 5\' 11"  (1.803 m)   Wt 168 lb (76.2 kg)   SpO2 97%   BMI 23.43 kg/m   Body mass index is 23.43 kg/m.  Advanced Directives 07/26/2017 07/23/2014 03/03/2012  Does Patient Have a Medical Advance Directive? Yes No;Yes Patient has advance directive, copy not in chart  Type of Advance Directive [place];Living will [place];Living will [place];Living will  Does patient want to make changes to medical advance directive? - Yes - information given -  Copy of [place] in Chart? No - copy requested No - copy requested -  Pre-existing out of facility DNR order (yellow form or pink MOST form) - - No    Tobacco Social History   Tobacco Use  Smoking Status Never Smoker  Smokeless Tobacco Never Used     Counseling given: Not Answered   Clinical Intake:     Pain : No/denies pain    Past Medical History:  Diagnosis  Date  . DVT (deep venous thrombosis) ([place])    hx of 2001 (after prostate surgery)  . Hyperlipidemia   . Mitral valve prolapse   . PE (pulmonary embolism)    5-09:was referred to hematology, and they recommend to discontinue Coumadin 5/11  . Prostate cancer [facility])     released from urology in 2010,needs yearly PSAs with PCP   Past Surgical History:  Procedure Laterality Date  . PROSTATECTOMY  2000   Family History  Problem Relation Age of Onset  . CAD Father 24  . Diabetes Neg Hx   . Colon cancer Neg Hx   . Prostate cancer Neg Hx    Social History   Socioeconomic History  . Marital status: Married    Spouse name: Not on file  . Number of children: 0  . Years of education: Not on file  . Highest education level: Not on file  Occupational History  . Occupation: Retired    Fish farm manager: RETIRED  Social Needs  . Financial resource strain: Not on file  . Food insecurity:    Worry: Not on file    Inability: Not on file  . Transportation needs:    Medical: Not on file    Non-medical: Not on file  Tobacco Use  . Smoking status: Never Smoker  . Smokeless tobacco: Never Used  Substance and Sexual Activity  . Alcohol use: No  . Drug use: No  . Sexual activity: Not  on file  Lifestyle  . Physical activity:    Days per week: Not on file    Minutes per session: Not on file  . Stress: Not on file  Relationships  . Social connections:    Talks on phone: Not on file    Gets together: Not on file    Attends religious service: Not on file    Active member of club or organization: Not on file    Attends meetings of clubs or organizations: Not on file    Relationship status: Not on file  Other Topics Concern  . Not on file  Social History Narrative   Married, wife w/ melanoma, advanced, doing well as off 08-2016     no children    Moved to [facility] 03-2016 (  at the independent area as off 08-2016)    Outpatient Encounter Medications as of 07/26/2017  Medication Sig  .  aspirin 81 MG tablet Take 162 mg by mouth daily.  Marland Kitchen atenolol (TENORMIN) 50 MG tablet Take 1 tablet (50 mg total) by mouth daily.  . diphenhydramine-acetaminophen (TYLENOL PM) 25-500 MG TABS Take 1 tablet by mouth at bedtime as needed.  Marland Kitchen erythromycin ophthalmic ointment 1 application at bedtime.  . fluticasone (FLONASE) 50 MCG/ACT nasal spray Place 1 spray into both nostrils daily as needed for allergies or rhinitis.  . folic acid (FOLVITE) 449 MCG tablet Take 400 mcg by mouth daily.    . Hypromellose (ARTIFICIAL TEARS OP) Apply to eye daily as needed.  . latanoprost (XALATAN) 0.005 % ophthalmic solution Place 1 drop into both eyes at bedtime. Reported on 10/08/2015  . Multiple Vitamins-Minerals (MULTIVITAMIN,TX-MINERALS) tablet Take 1 tablet by mouth daily.    . polyethylene glycol (MIRALAX / GLYCOLAX) packet Take 17 g by mouth daily.  . pravastatin (PRAVACHOL) 40 MG tablet TAKE 1 TABLET BY MOUTH  DAILY  . timolol (TIMOPTIC) 0.5 % ophthalmic solution   . cyclobenzaprine (FLEXERIL) 10 MG tablet Take 1 tablet (10 mg total) by mouth at bedtime as needed for muscle spasms. (Patient not taking: Reported on 07/26/2017)   No facility-administered encounter medications on file as of 07/26/2017.     Activities of Daily Living In your present state of health, do you have any difficulty performing the following activities: 07/26/2017 09/22/2016  Hearing? N N  Vision? N N  Difficulty concentrating or making decisions? N N  Walking or climbing stairs? N N  Dressing or bathing? N N  Doing errands, shopping? N N  Preparing Food and eating ? N -  Using the Toilet? N -  In the past six months, have you accidently leaked urine? N -  Do you have problems with loss of bowel control? N -  Managing your Medications? N -  Managing your Finances? N -  Housekeeping or managing your Housekeeping? N -  Some recent data might be hidden    Patient Care Team: Colon Branch, MD as PCP - Kennon Portela, MD as  Consulting Physician (Dermatology) Calvert Cantor, MD as Consulting Physician (Ophthalmology)   Assessment:   This is a routine wellness examination for Brandon Burke. Physical assessment deferred to PCP.  Exercise Activities and Dietary recommendations Current Exercise Habits: Home exercise routine, Type of exercise: walking, Time (Minutes): 20, Frequency (Times/Week): 7, Weekly Exercise (Minutes/Week): 140, Intensity: Mild Diet (meal preparation, eat out, water intake, caffeinated beverages, dairy products, fruits and vegetables): in general, a "healthy" diet  , well balanced    Goals    .  Maintain current health and independence.       Fall Risk Fall Risk  07/26/2017 09/22/2016 08/29/2014 08/13/2013 07/06/2012  Falls in the past year? No No No No No    Depression Screen PHQ 2/9 Scores 07/26/2017 09/22/2016 08/29/2014 08/13/2013  PHQ - 2 Score 0 0 0 0    Cognitive Function Ad8 score reviewed for issues:  Issues making decisions:no  Less interest in hobbies / activities:no  Repeats questions, stories (family complaining):no  Trouble using ordinary gadgets (microwave, computer, phone):no  Forgets the month or year: no  Mismanaging finances: no  Remembering appts:no  Daily problems with thinking and/or memory:no Ad8 score is=0           Immunization History  Administered Date(s) Administered  . Influenza Split 02/02/2014, 01/23/2015, 02/02/2017  . Influenza Whole 05/03/2005, 01/01/2010  . Influenza, High Dose Seasonal PF 02/01/2013, 01/23/2015  . Influenza-Unspecified 01/02/2012, 01/31/2014, 02/05/2016  . Pneumococcal Conjugate-13 08/29/2014  . Pneumococcal Polysaccharide-23 03/03/2005, 09/22/2016  . Td 01/01/2001  . Tdap 06/22/2011  . Zoster 06/03/2005  . Zoster Recombinat (Shingrix) 08/06/2016, 11/01/2016   Screening Tests Health Maintenance  Topic Date Due  . TETANUS/TDAP  06/21/2021  . INFLUENZA VACCINE  Completed  . PNA vac Low Risk Adult  Completed         Plan:   Follow up with PCP as directed  Continue to eat heart healthy diet (full of fruits, vegetables, whole grains, lean protein, water--limit salt, fat, and sugar intake) and increase physical activity as tolerated.  Continue doing brain stimulating activities (puzzles, reading, adult coloring books, staying active) to keep memory sharp.   Bring a copy of your living will and/or healthcare power of attorney to your next office visit.   I have personally reviewed and noted the following in the patient's chart:   . Medical and social history . Use of alcohol, tobacco or illicit drugs  . Current medications and supplements . Functional ability and status . Nutritional status . Physical activity . Advanced directives . List of other physicians . Hospitalizations, surgeries, and ER visits in previous 12 months . Vitals . Screenings to include cognitive, depression, and falls . Referrals and appointments  In addition, I have reviewed and discussed with patient certain preventive protocols, quality metrics, and best practice recommendations. A written personalized care plan for preventive services as well as general preventive health recommendations were provided to patient.     Naaman Plummer [place], [place]  07/26/2017  [name], MD

## 2017-07-26 ENCOUNTER — Ambulatory Visit (INDEPENDENT_AMBULATORY_CARE_PROVIDER_SITE_OTHER): Payer: Medicare Other | Admitting: *Deleted

## 2017-07-26 ENCOUNTER — Encounter: Payer: Self-pay | Admitting: *Deleted

## 2017-07-26 VITALS — BP 138/76 | HR 64 | Ht 71.0 in | Wt 168.0 lb

## 2017-07-26 DIAGNOSIS — Z0000 Encounter for general adult medical examination without abnormal findings: Secondary | ICD-10-CM | POA: Diagnosis not present

## 2017-07-26 NOTE — Patient Instructions (Signed)
Continue to eat heart healthy diet (full of fruits, vegetables, whole grains, lean protein, water--limit salt, fat, and sugar intake) and increase physical activity as tolerated.  Continue doing brain stimulating activities (puzzles, reading, adult coloring books, staying active) to keep memory sharp.   Bring a copy of your living will and/or healthcare power of attorney to your next office visit.   Mr. Brandon Burke , Thank you for taking time to come for your Medicare Wellness Visit. I appreciate your ongoing commitment to your health goals. Please review the following plan we discussed and let me know if I can assist you in the future.   These are the goals we discussed: Goals    . Maintain current health and independence.       This is a list of the screening recommended for you and due dates:  Health Maintenance  Topic Date Due  . Tetanus Vaccine  06/21/2021  . Flu Shot  Completed  . Pneumonia vaccines  Completed    Health Maintenance, Male A healthy lifestyle and preventive care is important for your health and wellness. Ask your health care provider about what schedule of regular examinations is right for you. What should I know about weight and diet? Eat a Healthy Diet  Eat plenty of vegetables, fruits, whole grains, low-fat dairy products, and lean protein.  Do not eat a lot of foods high in solid fats, added sugars, or salt.  Maintain a Healthy Weight Regular exercise can help you achieve or maintain a healthy weight. You should:  Do at least 150 minutes of exercise each week. The exercise should increase your heart rate and make you sweat (moderate-intensity exercise).  Do strength-training exercises at least twice a week.  Watch Your Levels of Cholesterol and Blood Lipids  Have your blood tested for lipids and cholesterol every 5 years starting at 82 years of age. If you are at high risk for heart disease, you should start having your blood tested when you are 82  years old. You may need to have your cholesterol levels checked more often if: ? Your lipid or cholesterol levels are high. ? You are older than 82 years of age. ? You are at high risk for heart disease.  What should I know about cancer screening? Many types of cancers can be detected early and may often be prevented. Lung Cancer  You should be screened every year for lung cancer if: ? You are a current smoker who has smoked for at least 30 years. ? You are a former smoker who has quit within the past 15 years.  Talk to your health care provider about your screening options, when you should start screening, and how often you should be screened.  Colorectal Cancer  Routine colorectal cancer screening usually begins at 82 years of age and should be repeated every 5-10 years until you are 82 years old. You may need to be screened more often if early forms of precancerous polyps or small growths are found. Your health care provider may recommend screening at an earlier age if you have risk factors for colon cancer.  Your health care provider may recommend using home test kits to check for hidden blood in the stool.  A small camera at the end of a tube can be used to examine your colon (sigmoidoscopy or colonoscopy). This checks for the earliest forms of colorectal cancer.  Prostate and Testicular Cancer  Depending on your age and overall health, your health care provider may  do certain tests to screen for prostate and testicular cancer.  Talk to your health care provider about any symptoms or concerns you have about testicular or prostate cancer.  Skin Cancer  Check your skin from head to toe regularly.  Tell your health care provider about any new moles or changes in moles, especially if: ? There is a change in a mole's size, shape, or color. ? You have a mole that is larger than a pencil eraser.  Always use sunscreen. Apply sunscreen liberally and repeat throughout the  day.  Protect yourself by wearing long sleeves, pants, a wide-brimmed hat, and sunglasses when outside.  What should I know about heart disease, diabetes, and high blood pressure?  If you are 39-57 years of age, have your blood pressure checked every 3-5 years. If you are 35 years of age or older, have your blood pressure checked every year. You should have your blood pressure measured twice-once when you are at a hospital or clinic, and once when you are not at a hospital or clinic. Record the average of the two measurements. To check your blood pressure when you are not at a hospital or clinic, you can use: ? An automated blood pressure machine at a pharmacy. ? A home blood pressure monitor.  Talk to your health care provider about your target blood pressure.  If you are between 15-57 years old, ask your health care provider if you should take aspirin to prevent heart disease.  Have regular diabetes screenings by checking your fasting blood sugar level. ? If you are at a normal weight and have a low risk for diabetes, have this test once every three years after the age of 2. ? If you are overweight and have a high risk for diabetes, consider being tested at a younger age or more often.  A one-time screening for abdominal aortic aneurysm (AAA) by ultrasound is recommended for men aged 70-75 years who are current or former smokers. What should I know about preventing infection? Hepatitis B If you have a higher risk for hepatitis B, you should be screened for this virus. Talk with your health care provider to find out if you are at risk for hepatitis B infection. Hepatitis C Blood testing is recommended for:  Everyone born from [birth year] through 1963.  Anyone with known risk factors for hepatitis C.  Sexually Transmitted Diseases (STDs)  You should be screened each year for STDs including gonorrhea and chlamydia if: ? You are sexually active and are younger than 82 years of age. ? You are  older than 82 years of age and your health care provider tells you that you are at risk for this type of infection. ? Your sexual activity has changed since you were last screened and you are at an increased risk for chlamydia or gonorrhea. Ask your health care provider if you are at risk.  Talk with your health care provider about whether you are at high risk of being infected with HIV. Your health care provider may recommend a prescription medicine to help prevent HIV infection.  What else can I do?  Schedule regular health, dental, and eye exams.  Stay current with your vaccines (immunizations).  Do not use any tobacco products, such as cigarettes, chewing tobacco, and e-cigarettes. If you need help quitting, ask your health care provider.  Limit alcohol intake to no more than 2 drinks per day. One drink equals 12 ounces of beer, 5 ounces of wine, or 1 ounces  of hard liquor.  Do not use street drugs.  Do not share needles.  Ask your health care provider for help if you need support or information about quitting drugs.  Tell your health care provider if you often feel depressed.  Tell your health care provider if you have ever been abused or do not feel safe at home. This information is not intended to replace advice given to you by your health care provider. Make sure you discuss any questions you have with your health care provider. Document Released: 10/16/2007 Document Revised: 12/17/2015 Document Reviewed: 01/21/2015 Elsevier Interactive Patient Education  [employer].

## 2017-08-03 ENCOUNTER — Telehealth: Payer: Self-pay | Admitting: Internal Medicine

## 2017-08-03 ENCOUNTER — Ambulatory Visit: Payer: Self-pay | Admitting: *Deleted

## 2017-08-03 NOTE — Telephone Encounter (Signed)
Copied from [place] [id number]. Topic: General - Other >> Aug 03, 2017 12:18 PM Carolyn Stare wrote:   Pt cal lto say he has a cold and would like advise on what he may can take. Did want an appt unless is was necessary

## 2017-08-03 NOTE — Telephone Encounter (Signed)
I returned his call regarding upper respiratory symptoms.  See triage notes.  I let him know to call us back if he does not improve over the next week or he develops fever, coughing thick, yellow or green mucus, body aches or chills or shortness of breath.  He verbalized understanding and was agreeable to this plan.   He thanked me for just confirming he was doing the right things at home as far as his symptoms go.   Reason for Disposition . Cold with no complications  Answer Assessment - Initial Assessment Questions 1. ONSET: "When did the nasal discharge start?"      Last week 2. AMOUNT: "How much discharge is there?"      My nose is runny and I'm sneezing.   My cough is better but it interferes with my sleep. 3. COUGH: "Do you have a cough?" If yes, ask: "Describe the color of your sputum" (clear, white, yellow, green)     Non productive 4. RESPIRATORY DISTRESS: "Describe your breathing."      No shortness of breath. 5. FEVER: "Do you have a fever?" If so, ask: "What is your temperature, how was it measured, and when did it start?"     No fever.  I checked it. 6. SEVERITY: "Overall, how bad are you feeling right now?" (e.g., doesn't interfere with normal activities, staying home from school/work, staying in bed)      I'm weak.   I'm not resting good at night 7. OTHER SYMPTOMS: "Do you have any other symptoms?" (e.g., sore throat, earache, wheezing, vomiting)     No N/V/D.   Throat scratchy but not sore.  I'm gargling with salt water 8. PREGNANCY: "Is there any chance you are pregnant?" "When was your last menstrual period?"     N/A  Protocols used: COMMON COLD-A-AH

## 2017-08-08 ENCOUNTER — Ambulatory Visit (INDEPENDENT_AMBULATORY_CARE_PROVIDER_SITE_OTHER): Payer: Medicare Other | Admitting: Internal Medicine

## 2017-08-08 ENCOUNTER — Encounter: Payer: Self-pay | Admitting: Internal Medicine

## 2017-08-08 VITALS — BP 134/70 | HR 71 | Temp 98.0°F | Resp 16 | Ht 72.0 in | Wt 168.4 lb

## 2017-08-08 DIAGNOSIS — H6690 Otitis media, unspecified, unspecified ear: Secondary | ICD-10-CM | POA: Diagnosis not present

## 2017-08-08 MED ORDER — AMOXICILLIN 875 MG PO TABS: 875.0000 mg | ORAL_TABLET | Freq: Two times a day (BID) | ORAL | 0 refills | Status: DC

## 2017-08-08 NOTE — Progress Notes (Signed)
Pre visit review using our clinic review tool, if applicable. No additional management support is needed unless otherwise documented below in the visit note. 

## 2017-08-08 NOTE — Patient Instructions (Signed)
Rest, fluids , tylenol  For cough:  Take Mucinex DM twice a day as needed until better or continue with Coricidin  Use OTC  Flonase : 2 nasal sprays on each side of the nose in the morning until you feel better  Take the antibiotic as prescribed  (Amoxicillin)  Call if not gradually better over the next  10 days  Call anytime if the symptoms are severe

## 2017-08-08 NOTE — Progress Notes (Signed)
Subjective:    Patient ID: Brandon Burke, male    DOB: 1932-02-17, 84 y.o.   MRN: [id number]  DOS:  08/08/2017 Type of visit - description : acute Interval history: Sx stated 9 days ago: Dry cough, sinus congestion, also developed left ear discomfort, ear was  feeling like clogged and had decreased hearing. Has been taking Coricidin with some help. + Mild sore throat. Here mostly because he continue having left ear discomfort.   Review of Systems No fever chills No nausea vomiting or diarrhea No myalgias  He had clear runny nose, getting slightly better Denies chest pain, difficulty breathing, wheezing.  Past Medical History:  Diagnosis Date  . DVT (deep venous thrombosis) ([employer])    hx of 2001 (after prostate surgery)  . Hyperlipidemia   . Mitral valve prolapse   . PE (pulmonary embolism)    5-09:was referred to hematology, and they recommend to discontinue Coumadin 5/11  . Prostate cancer [facility])     released from urology in 2010,needs yearly PSAs with PCP    Past Surgical History:  Procedure Laterality Date  . PROSTATECTOMY  2000    Social History   Socioeconomic History  . Marital status: Married    Spouse name: Not on file  . Number of children: 0  . Years of education: Not on file  . Highest education level: Not on file  Occupational History  . Occupation: Retired    Fish farm manager: RETIRED  Social Needs  . Financial resource strain: Not on file  . Food insecurity:    Worry: Not on file    Inability: Not on file  . Transportation needs:    Medical: Not on file    Non-medical: Not on file  Tobacco Use  . Smoking status: Never Smoker  . Smokeless tobacco: Never Used  Substance and Sexual Activity  . Alcohol use: No  . Drug use: No  . Sexual activity: Not on file  Lifestyle  . Physical activity:    Days per week: Not on file    Minutes per session: Not on file  . Stress: Not on file  Relationships  . Social connections:    Talks on phone: Not on  file    Gets together: Not on file    Attends religious service: Not on file    Active member of club or organization: Not on file    Attends meetings of clubs or organizations: Not on file    Relationship status: Not on file  . Intimate partner violence:    Fear of current or ex partner: Not on file    Emotionally abused: Not on file    Physically abused: Not on file    Forced sexual activity: Not on file  Other Topics Concern  . Not on file  Social History Narrative   Married, wife w/ melanoma, advanced, doing well as off 08-2016     no children    Moved to [facility] 03-2016 (  at the independent area as off 08-2016)      Allergies as of 08/08/2017      Reactions   Nitrofuran Derivatives    headache   Sulfonamide Derivatives Nausea And Vomiting      Medication List        Accurate as of 08/08/17 11:59 PM. Always use your most recent med list.          amoxicillin 875 MG tablet Commonly known as:  AMOXIL Take 1 tablet (875 mg  total) by mouth 2 (two) times daily.   ARTIFICIAL TEARS OP Apply to eye daily as needed.   aspirin 81 MG tablet Take 162 mg by mouth daily.   atenolol 50 MG tablet Commonly known as:  TENORMIN Take 1 tablet (50 mg total) by mouth daily.   cyclobenzaprine 10 MG tablet Commonly known as:  FLEXERIL Take 1 tablet (10 mg total) by mouth at bedtime as needed for muscle spasms.   diphenhydramine-acetaminophen 25-500 MG Tabs tablet Commonly known as:  TYLENOL PM Take 1 tablet by mouth at bedtime as needed.   erythromycin ophthalmic ointment 1 application at bedtime.   fluticasone 50 MCG/ACT nasal spray Commonly known as:  FLONASE Place 1 spray into both nostrils daily as needed for allergies or rhinitis.   folic acid 284 MCG tablet Commonly known as:  FOLVITE Take 400 mcg by mouth daily.   latanoprost 0.005 % ophthalmic solution Commonly known as:  XALATAN Place 1 drop into both eyes at bedtime. Reported on 10/08/2015     multivitamin,tx-minerals tablet Take 1 tablet by mouth daily.   polyethylene glycol packet Commonly known as:  MIRALAX / GLYCOLAX Take 17 g by mouth daily.   pravastatin 40 MG tablet Commonly known as:  PRAVACHOL TAKE 1 TABLET BY MOUTH  DAILY   timolol 0.5 % ophthalmic solution Commonly known as:  TIMOPTIC          Objective:   Physical Exam BP 134/70 (BP Location: Left Arm, Patient Position: Sitting, Cuff Size: Small)   Pulse 71   Temp 98 F (36.7 C) (Oral)   Resp 16   Ht 6' (1.829 m)   Wt 168 lb 6 oz (76.4 kg)   SpO2 92%   BMI 22.84 kg/m  General:   Well developed, well nourished . NAD.  HEENT:  Normocephalic . Face symmetric, atraumatic. Throat: Symmetric, no red.  Sinuses no TTP.  Nose is slightly congested.  TMs: Right normal, left bulge, slightly red, no discharge, no wax.  Canal normal Lungs:  CTA B Normal respiratory effort, no intercostal retractions, no accessory muscle use. Heart: RRR,  no murmur.  No pretibial edema bilaterally  Skin: Not pale. Not jaundice Neurologic:  alert & oriented X3.  Speech normal, gait appropriate for age and unassisted Psych--  Cognition and judgment appear intact.  Cooperative with normal attention span and concentration.  Behavior appropriate. No anxious or depressed appearing.      Assessment & Plan:   Assessment Hyperlipidemia Prostate cancer, released from urology 2010, Rx yearly PSAs by PCP HEM:DVT 2001 after surgery, PE 2009, eval by hematology, they recommend DC Coumadin 2011 H/o MVP-- on BB glaucoma Sees dermatology q 6 months   PLAN OMA, URI: Findings c/w left otitis media.  Continue supportive care including Coricidin or Mucinex, Flonase, start amoxicillin for 5 days.  Call if not improving.

## 2017-08-09 NOTE — Assessment & Plan Note (Signed)
OMA, URI: Findings c/w left otitis media.  Continue supportive care including Coricidin or Mucinex, Flonase, start amoxicillin for 5 days.  Call if not improving.

## 2017-08-30 ENCOUNTER — Encounter: Payer: Self-pay | Admitting: Internal Medicine

## 2017-08-30 ENCOUNTER — Ambulatory Visit (INDEPENDENT_AMBULATORY_CARE_PROVIDER_SITE_OTHER): Payer: Medicare Other | Admitting: Internal Medicine

## 2017-08-30 VITALS — BP 126/64 | HR 56 | Temp 97.6°F | Resp 14 | Ht 72.0 in | Wt 167.1 lb

## 2017-08-30 DIAGNOSIS — H6590 Unspecified nonsuppurative otitis media, unspecified ear: Secondary | ICD-10-CM

## 2017-08-30 MED ORDER — AZELASTINE HCL 0.1 % NA SOLN: 2.0000 | Freq: Every evening | NASAL | 3 refills | Status: DC | PRN

## 2017-08-30 MED ORDER — PREDNISONE 10 MG PO TABS
ORAL_TABLET | ORAL | 0 refills | Status: DC
Start: 2017-08-30 — End: 2017-09-27

## 2017-08-30 NOTE — Progress Notes (Signed)
Subjective:    Patient ID: Brandon Burke, male    DOB: [birth year], 84 y.o.   MRN: [id number]  DOS:  08/30/2017 Type of visit - description : acute Interval history: Was seen with L otitis media 08/08/2017, took antibiotics, felt better however he is having decreased hearing on the left ear.  He is not completely sure if the this started acutely or if the decreased hearing is going on for a while.   Review of Systems  No fever chills No ear discharge or pain Mild tinnitus on and off, not far from baseline.  Past Medical History:  Diagnosis Date  . DVT (deep venous thrombosis) ([place])    hx of 2001 (after prostate surgery)  . Hyperlipidemia   . Mitral valve prolapse   . PE (pulmonary embolism)    5-09:was referred to hematology, and they recommend to discontinue Coumadin 5/11  . Prostate cancer [facility])     released from urology in 2010,needs yearly PSAs with PCP    Past Surgical History:  Procedure Laterality Date  . PROSTATECTOMY  2000    Social History   Socioeconomic History  . Marital status: Married    Spouse name: Not on file  . Number of children: 0  . Years of education: Not on file  . Highest education level: Not on file  Occupational History  . Occupation: Retired    Fish farm manager: RETIRED  Social Needs  . Financial resource strain: Not on file  . Food insecurity:    Worry: Not on file    Inability: Not on file  . Transportation needs:    Medical: Not on file    Non-medical: Not on file  Tobacco Use  . Smoking status: Never Smoker  . Smokeless tobacco: Never Used  Substance and Sexual Activity  . Alcohol use: No  . Drug use: No  . Sexual activity: Not on file  Lifestyle  . Physical activity:    Days per week: Not on file    Minutes per session: Not on file  . Stress: Not on file  Relationships  . Social connections:    Talks on phone: Not on file    Gets together: Not on file    Attends religious service: Not on file    Active member of club  or organization: Not on file    Attends meetings of clubs or organizations: Not on file    Relationship status: Not on file  . Intimate partner violence:    Fear of current or ex partner: Not on file    Emotionally abused: Not on file    Physically abused: Not on file    Forced sexual activity: Not on file  Other Topics Concern  . Not on file  Social History Narrative   Married, wife w/ melanoma, advanced, doing well as off 08-2016     no children    Moved to [facility] 03-2016 (  at the independent area as off 08-2016)      Allergies as of 08/30/2017      Reactions   Nitrofuran Derivatives    headache   Sulfonamide Derivatives Nausea And Vomiting      Medication List        Accurate as of 08/30/17 11:59 PM. Always use your most recent med list.          ARTIFICIAL TEARS OP Apply to eye daily as needed.   aspirin 81 MG tablet Take 162 mg by mouth daily.  atenolol 50 MG tablet Commonly known as:  TENORMIN Take 1 tablet (50 mg total) by mouth daily.   azelastine 0.1 % nasal spray Commonly known as:  ASTELIN Place 2 sprays into both nostrils at bedtime as needed for rhinitis. Use in each nostril as directed   cyclobenzaprine 10 MG tablet Commonly known as:  FLEXERIL Take 1 tablet (10 mg total) by mouth at bedtime as needed for muscle spasms.   diphenhydramine-acetaminophen 25-500 MG Tabs tablet Commonly known as:  TYLENOL PM Take 1 tablet by mouth at bedtime as needed.   erythromycin ophthalmic ointment 1 application at bedtime.   fluticasone 50 MCG/ACT nasal spray Commonly known as:  FLONASE Place 1 spray into both nostrils daily as needed for allergies or rhinitis.   folic acid 765 MCG tablet Commonly known as:  FOLVITE Take 400 mcg by mouth daily.   latanoprost 0.005 % ophthalmic solution Commonly known as:  XALATAN Place 1 drop into both eyes at bedtime. Reported on 10/08/2015   multivitamin,tx-minerals tablet Take 1 tablet by mouth daily.     polyethylene glycol packet Commonly known as:  MIRALAX / GLYCOLAX Take 17 g by mouth daily.   pravastatin 40 MG tablet Commonly known as:  PRAVACHOL TAKE 1 TABLET BY MOUTH  DAILY   predniSONE 10 MG tablet Commonly known as:  DELTASONE 2 tabs a day x 5 days   timolol 0.5 % ophthalmic solution Commonly known as:  TIMOPTIC          Objective:   Physical Exam BP 126/64 (BP Location: Right Arm, Patient Position: Sitting, Cuff Size: Small)   Pulse (!) 56   Temp 97.6 F (36.4 C) (Oral)   Resp 14   Ht 6' (1.829 m)   Wt 167 lb 2 oz (75.8 kg)   SpO2 96%   BMI 22.67 kg/m  General:   Well developed, well nourished . NAD.  HEENT:  Normocephalic . Face symmetric, atraumatic. Throat: Symmetric, no red. Right TM normal Left TM: Slightly bulge, no red, no wax, canal normal. Lungs:  CTA B Normal respiratory effort, no intercostal retractions, no accessory muscle use. Heart: RRR,  no murmur.  No pretibial edema bilaterally  Skin: Not pale. Not jaundice Neurologic:  alert & oriented X3.  Speech normal, gait appropriate for age and unassisted Psych--  Cognition and judgment appear intact.  Cooperative with normal attention span and concentration.  Behavior appropriate. No anxious or depressed appearing.      Assessment & Plan:    Assessment Hyperlipidemia Prostate cancer, released from urology 2010, Rx yearly PSAs by PCP HEM:DVT 2001 after surgery, PE 2009, eval by hematology, they recommend DC Coumadin 2011 H/o MVP-- on BB glaucoma Sees dermatology q 6 months   PLAN Serous otitis: Patient was diagnosed with OMA,  treated with amoxicillin, now present with sx c/w serous otitis: Decreased hearing.  Recommend Flonase and Astelin consistently, low-dose prednisone x5 days.  If not gradually improving he would let me know.  Will need a ENT referral

## 2017-08-30 NOTE — Patient Instructions (Signed)
Flonase 2 sprays on each side of the nose every morning  Astelin 2 sprays of each side of the nose every night  Use the sprays consistently for at least 2 weeks  Prednisone for 5 days  Call if not gradually better

## 2017-08-30 NOTE — Progress Notes (Signed)
Pre visit review using our clinic review tool, if applicable. No additional management support is needed unless otherwise documented below in the visit note. 

## 2017-08-31 NOTE — Assessment & Plan Note (Signed)
Serous otitis: Patient was diagnosed with OMA,  treated with amoxicillin, now present with sx c/w serous otitis: Decreased hearing.  Recommend Flonase and Astelin consistently, low-dose prednisone x5 days.  If not gradually improving he would let me know.  Will need a ENT referral

## 2017-09-02 DIAGNOSIS — L570 Actinic keratosis: Secondary | ICD-10-CM | POA: Diagnosis not present

## 2017-09-02 DIAGNOSIS — L820 Inflamed seborrheic keratosis: Secondary | ICD-10-CM | POA: Diagnosis not present

## 2017-09-02 DIAGNOSIS — L821 Other seborrheic keratosis: Secondary | ICD-10-CM | POA: Diagnosis not present

## 2017-09-02 DIAGNOSIS — Z85828 Personal history of other malignant neoplasm of skin: Secondary | ICD-10-CM | POA: Diagnosis not present

## 2017-09-09 DIAGNOSIS — H401131 Primary open-angle glaucoma, bilateral, mild stage: Secondary | ICD-10-CM | POA: Diagnosis not present

## 2017-09-09 DIAGNOSIS — H02055 Trichiasis without entropian left lower eyelid: Secondary | ICD-10-CM | POA: Diagnosis not present

## 2017-09-09 DIAGNOSIS — H43813 Vitreous degeneration, bilateral: Secondary | ICD-10-CM | POA: Diagnosis not present

## 2017-09-09 DIAGNOSIS — H16142 Punctate keratitis, left eye: Secondary | ICD-10-CM | POA: Diagnosis not present

## 2017-09-19 ENCOUNTER — Ambulatory Visit: Payer: Self-pay

## 2017-09-19 DIAGNOSIS — H6500 Acute serous otitis media, unspecified ear: Secondary | ICD-10-CM

## 2017-09-19 NOTE — Telephone Encounter (Signed)
Telephone call from pt. Questioning if he should continue the medications and if he needs a referral for a ENT.  Pt. States he is better but continues to hear a swishy sound. negative for fever , chills, runny nose and cough.  Pt. Feels the high pollen   May have been the original cause.  Pt. Awaits recommendations on continuing medications and if referral is needed.       Reason for Disposition . Ear congestion present > 48 hours    Pt. States he is experiences a 'swishing sound " in his left ear. Already on antibiotic, and nasal decongestant.  Feels hearing has improved. Questions if needs if needs  ENT  Referral  Prior to to seeing Dr.  Larose Kells on 5/ 28/19.  Answer Assessment - Initial Assessment Questions 1. LOCATION: "Which ear is involved?"        Left ear 2. SENSATION: "Describe how the ear feels."      Sounds like water is in my ear" 3. ONSET:  "When did the ear symptoms start?"        4. PAIN: "Do you also have an earache?" If so, ask: "How bad is it?" (Scale 1-10; or mild, moderate, severe)    Not after it open up after it open up pain up5. CAUSE: "What do you think is causing the ear congestion?"    Has been treated for  Ear infection  6. URI: "Do you have a runny nose or cough?"      no 7. NASAL ALLERGIES: "Are there symptoms of hay fever, such as sneezing or a clear nasal discharge?" no   "Maybe the pollen contributed" 8. PREGNANCY: "Is there any chance you are pregnant?" "When was your last menstrual period?"     **na  Protocols used: EAR - CONGESTION-A-AH

## 2017-09-19 NOTE — Telephone Encounter (Signed)
Arrange ENT referral, DX serous otitis

## 2017-09-19 NOTE — Addendum Note (Signed)
Addended byDamita Dunnings D on: 09/19/2017 05:22 PM   Modules accepted: Orders

## 2017-09-19 NOTE — Telephone Encounter (Signed)
Referral placed.

## 2017-09-19 NOTE — Telephone Encounter (Signed)
Please advise 

## 2017-09-27 ENCOUNTER — Encounter: Payer: Self-pay | Admitting: Internal Medicine

## 2017-09-27 ENCOUNTER — Ambulatory Visit (INDEPENDENT_AMBULATORY_CARE_PROVIDER_SITE_OTHER): Payer: Medicare Other | Admitting: Internal Medicine

## 2017-09-27 VITALS — BP 126/70 | HR 67 | Temp 98.0°F | Resp 14 | Ht 72.0 in | Wt 168.1 lb

## 2017-09-27 DIAGNOSIS — Z0000 Encounter for general adult medical examination without abnormal findings: Secondary | ICD-10-CM | POA: Diagnosis not present

## 2017-09-27 LAB — LIPID PANEL
CHOL/HDL RATIO: 4
Cholesterol: 145 mg/dL (ref 0–200)
HDL: 39.7 mg/dL (ref >39.00)
LDL CALC: 81 mg/dL (ref 0–99)
NONHDL: 105.22
TRIGLYCERIDES: 119 mg/dL (ref 0.0–149.0)
VLDL: 23.8 mg/dL (ref 0.0–40.0)

## 2017-09-27 LAB — COMPREHENSIVE METABOLIC PANEL
ALT: 20 U/L (ref 0–53)
AST: 17 U/L (ref 0–37)
Albumin: 4.2 g/dL (ref 3.5–5.2)
Alkaline Phosphatase: 57 U/L (ref 39–117)
BUN: 17 mg/dL (ref 6–23)
CHLORIDE: 107 meq/L (ref 96–112)
CO2: 28 meq/L (ref 19–32)
Calcium: 9.3 mg/dL (ref 8.4–10.5)
Creatinine, Ser: 0.97 mg/dL (ref 0.40–1.50)
GFR: 78.41 mL/min (ref >60.00)
GLUCOSE: 94 mg/dL (ref 70–99)
POTASSIUM: 4.2 meq/L (ref 3.5–5.1)
Sodium: 142 mEq/L (ref 135–145)
Total Bilirubin: 0.5 mg/dL (ref 0.2–1.2)
Total Protein: 7 g/dL (ref 6.0–8.3)

## 2017-09-27 LAB — CBC WITH DIFFERENTIAL/PLATELET
BASOS PCT: 1.2 % (ref 0.0–3.0)
Basophils Absolute: 0.1 10*3/uL (ref 0.0–0.1)
EOS PCT: 4.6 % (ref 0.0–5.0)
Eosinophils Absolute: 0.2 10*3/uL (ref 0.0–0.7)
HEMATOCRIT: 44.8 % (ref 39.0–52.0)
Hemoglobin: 15.4 g/dL (ref 13.0–17.0)
LYMPHS ABS: 0.9 10*3/uL (ref 0.7–4.0)
LYMPHS PCT: 19.2 % (ref 12.0–46.0)
MCHC: 34.5 g/dL (ref 30.0–36.0)
MCV: 96.2 fl (ref 78.0–100.0)
MONOS PCT: 9.6 % (ref 3.0–12.0)
Monocytes Absolute: 0.5 10*3/uL (ref 0.1–1.0)
NEUTROS ABS: 3.1 10*3/uL (ref 1.4–7.7)
NEUTROS PCT: 65.4 % (ref 43.0–77.0)
Platelets: 157 10*3/uL (ref 150.0–400.0)
RBC: 4.65 Mil/uL (ref 4.22–5.81)
RDW: 12.9 % (ref 11.5–15.5)
WBC: 4.7 10*3/uL (ref 4.0–10.5)

## 2017-09-27 LAB — TSH: TSH: 3.73 u[IU]/mL (ref 0.35–4.50)

## 2017-09-27 LAB — PSA: PSA: 0.01 ng/mL — ABNORMAL LOW (ref 0.10–4.00)

## 2017-09-27 NOTE — Assessment & Plan Note (Signed)
Hyperlipidemia: Continue Pravachol, checking labs History of prostate cancer: Checking a PSA Glaucoma: Sees ophthalmology regularly. Sees dermatology frequently. Serous otitis: See previous visits, doing better, recommend to use Flonase / Astelin as needed History of DVT, PE: On aspirin. RTC 1 year

## 2017-09-27 NOTE — Progress Notes (Signed)
Subjective:    Patient ID: Brandon Burke, male    DOB: [birth year], 84 y.o.   MRN: [id number]  DOS:  09/27/2017 Type of visit - description : Physical exam Interval history: Here with his wife, no major concerns   Review of Systems Doing well, specifically denies difficulty urinating, blood in the urine, chest pain, depression.  Other than above, a 14 point review of systems is negative     Past Medical History:  Diagnosis Date  . DVT (deep venous thrombosis) ([place])    hx of 2001 (after prostate surgery)  . Hyperlipidemia   . Mitral valve prolapse   . PE (pulmonary embolism)    5-09:was referred to hematology, and they recommend to discontinue Coumadin 5/11  . Prostate cancer [facility])     released from urology in 2010,needs yearly PSAs with PCP    Past Surgical History:  Procedure Laterality Date  . PROSTATECTOMY  2000    Social History   Socioeconomic History  . Marital status: Married    Spouse name: Not on file  . Number of children: 0  . Years of education: Not on file  . Highest education level: Not on file  Occupational History  . Occupation: Retired    Fish farm manager: RETIRED  Social Needs  . Financial resource strain: Not on file  . Food insecurity:    Worry: Not on file    Inability: Not on file  . Transportation needs:    Medical: Not on file    Non-medical: Not on file  Tobacco Use  . Smoking status: Never Smoker  . Smokeless tobacco: Never Used  Substance and Sexual Activity  . Alcohol use: No  . Drug use: No  . Sexual activity: Not on file  Lifestyle  . Physical activity:    Days per week: Not on file    Minutes per session: Not on file  . Stress: Not on file  Relationships  . Social connections:    Talks on phone: Not on file    Gets together: Not on file    Attends religious service: Not on file    Active member of club or organization: Not on file    Attends meetings of clubs or organizations: Not on file    Relationship status: Not  on file  . Intimate partner violence:    Fear of current or ex partner: Not on file    Emotionally abused: Not on file    Physically abused: Not on file    Forced sexual activity: Not on file  Other Topics Concern  . Not on file  Social History Narrative   Married, wife w/ melanoma, advanced, doing well as off 08-2016     no children    Moved to [facility] 03-2016 (  at the independent area as off 08-2017)     Family History  Problem Relation Age of Onset  . CAD Father 7  . Diabetes Neg Hx   . Colon cancer Neg Hx   . Prostate cancer Neg Hx      Allergies as of 09/27/2017      Reactions   Nitrofuran Derivatives    headache   Sulfonamide Derivatives Nausea And Vomiting      Medication List        Accurate as of 09/27/17  6:59 PM. Always use your most recent med list.          ARTIFICIAL TEARS OP Apply to eye daily as needed.  aspirin 81 MG tablet Take 162 mg by mouth daily.   atenolol 50 MG tablet Commonly known as:  TENORMIN Take 1 tablet (50 mg total) by mouth daily.   azelastine 0.1 % nasal spray Commonly known as:  ASTELIN Place 2 sprays into both nostrils at bedtime as needed for rhinitis. Use in each nostril as directed   cyclobenzaprine 10 MG tablet Commonly known as:  FLEXERIL Take 1 tablet (10 mg total) by mouth at bedtime as needed for muscle spasms.   diphenhydramine-acetaminophen 25-500 MG Tabs tablet Commonly known as:  TYLENOL PM Take 1 tablet by mouth at bedtime as needed.   erythromycin ophthalmic ointment 1 application at bedtime.   fluticasone 50 MCG/ACT nasal spray Commonly known as:  FLONASE Place 1 spray into both nostrils daily as needed for allergies or rhinitis.   folic acid 517 MCG tablet Commonly known as:  FOLVITE Take 400 mcg by mouth daily.   latanoprost 0.005 % ophthalmic solution Commonly known as:  XALATAN Place 1 drop into both eyes at bedtime. Reported on 10/08/2015   multivitamin,tx-minerals tablet Take 1  tablet by mouth daily.   polyethylene glycol packet Commonly known as:  MIRALAX / GLYCOLAX Take 17 g by mouth daily.   pravastatin 40 MG tablet Commonly known as:  PRAVACHOL TAKE 1 TABLET BY MOUTH  DAILY   timolol 0.5 % ophthalmic solution Commonly known as:  TIMOPTIC          Objective:   Physical Exam BP 126/70 (BP Location: Right Arm, Patient Position: Sitting, Cuff Size: Small)   Pulse 67   Temp 98 F (36.7 C) (Oral)   Resp 14   Ht 6' (1.829 m)   Wt 168 lb 2 oz (76.3 kg)   SpO2 97%   BMI 22.80 kg/m  General:   Well developed, well nourished . NAD.  Neck: No  thyromegaly  HEENT:  Normocephalic . Face symmetric, atraumatic.  TMs minimally bulge otherwise normal Lungs:  CTA B Normal respiratory effort, no intercostal retractions, no accessory muscle use. Heart: RRR,  no murmur.  No pretibial edema bilaterally  Abdomen:  Not distended, soft, non-tender. No rebound or rigidity.   Skin: Exposed areas without rash. Not pale. Not jaundice Neurologic:  alert & oriented X3.  Speech normal, gait appropriate for age and unassisted Strength symmetric and appropriate for age.  Psych: Cognition and judgment appear intact.  Cooperative with normal attention span and concentration.  Behavior appropriate. No anxious or depressed appearing.     Assessment & Plan:   Assessment Hyperlipidemia Prostate cancer, released from urology 2010, Rx yearly PSAs by PCP HEM:DVT 2001 after surgery, PE 2009, eval by hematology, they recommend DC Coumadin 2011 H/o MVP-- on BB glaucoma Sees dermatology q 6 months   PLAN Hyperlipidemia: Continue Pravachol, checking labs History of prostate cancer: Checking a PSA Glaucoma: Sees ophthalmology regularly. Sees dermatology frequently. Serous otitis: See previous visits, doing better, recommend to use Flonase / Astelin as needed History of DVT, PE: On aspirin. RTC 1 year

## 2017-09-27 NOTE — Assessment & Plan Note (Addendum)
--  Td  2013; pneumonia shot2006, booster 08-2016; prevnar -- 2016; s/p zostavax;  S/p shingrex #1 and  #2 --CCS: scope 01-2005, had tics, pt electing no further screening   --S/P prostatectomy for prostate cancer around 2000, discharge from urology 2010, Rx yearly PSAs; get a  psa  today --  Diet, exercise: Discussed --Labs: CMP, FLP, CBC, TSH, PSA --EKG: NSR

## 2017-09-27 NOTE — Patient Instructions (Signed)
GO TO THE LAB : Get the blood work     GO TO THE FRONT DESK Schedule your next appointment   for a physical exam in 1 year 

## 2017-09-27 NOTE — Progress Notes (Signed)
Pre visit review using our clinic review tool, if applicable. No additional management support is needed unless otherwise documented below in the visit note. 

## 2017-11-26 ENCOUNTER — Other Ambulatory Visit: Payer: Self-pay | Admitting: Internal Medicine

## 2017-12-01 ENCOUNTER — Encounter: Payer: Self-pay | Admitting: Internal Medicine

## 2017-12-01 ENCOUNTER — Ambulatory Visit (INDEPENDENT_AMBULATORY_CARE_PROVIDER_SITE_OTHER): Payer: Medicare Other | Admitting: Internal Medicine

## 2017-12-01 VITALS — BP 126/74 | HR 70 | Temp 97.9°F | Resp 16 | Ht 72.0 in | Wt 169.1 lb

## 2017-12-01 DIAGNOSIS — H6590 Unspecified nonsuppurative otitis media, unspecified ear: Secondary | ICD-10-CM

## 2017-12-01 MED ORDER — PREDNISONE 10 MG PO TABS: ORAL_TABLET | ORAL | 0 refills | Status: DC

## 2017-12-01 NOTE — Progress Notes (Signed)
Subjective:    Patient ID: Brandon Burke, male    DOB: December 05, 1931, 84 y.o.   MRN: [id number]  DOS:  12/01/2017 Type of visit - description : acute Interval history: Reports decreased hearing on the left side.  He was having sinus congestion, was started on Astelin on Flonase and symptoms are well controlled. Also had serous otitis, similar symptoms today, he responded well to a round of prednisone.   Review of Systems Denies tinnitus, ear pain or ear discharge. Right hearing is normal Past Medical History:  Diagnosis Date  . DVT (deep venous thrombosis) ([place])    hx of 2001 (after prostate surgery)  . Hyperlipidemia   . Mitral valve prolapse   . PE (pulmonary embolism)    5-09:was referred to hematology, and they recommend to discontinue Coumadin 5/11  . Prostate cancer [facility])     released from urology in 2010,needs yearly PSAs with PCP    Past Surgical History:  Procedure Laterality Date  . PROSTATECTOMY  2000    Social History   Socioeconomic History  . Marital status: Married    Spouse name: Not on file  . Number of children: 0  . Years of education: Not on file  . Highest education level: Not on file  Occupational History  . Occupation: Retired    Fish farm manager: RETIRED  Social Needs  . Financial resource strain: Not on file  . Food insecurity:    Worry: Not on file    Inability: Not on file  . Transportation needs:    Medical: Not on file    Non-medical: Not on file  Tobacco Use  . Smoking status: Never Smoker  . Smokeless tobacco: Never Used  Substance and Sexual Activity  . Alcohol use: No  . Drug use: No  . Sexual activity: Not on file  Lifestyle  . Physical activity:    Days per week: Not on file    Minutes per session: Not on file  . Stress: Not on file  Relationships  . Social connections:    Talks on phone: Not on file    Gets together: Not on file    Attends religious service: Not on file    Active member of club or organization: Not on  file    Attends meetings of clubs or organizations: Not on file    Relationship status: Not on file  . Intimate partner violence:    Fear of current or ex partner: Not on file    Emotionally abused: Not on file    Physically abused: Not on file    Forced sexual activity: Not on file  Other Topics Concern  . Not on file  Social History Narrative   Married, wife w/ melanoma, advanced, doing well as off 08-2016     no children    Moved to [facility] 03-2016 (  at the independent area [place] off 08-2017)      Allergies as of 12/01/2017      Reactions   Nitrofuran Derivatives    headache   Sulfonamide Derivatives Nausea And Vomiting      Medication List        Accurate as of 12/01/17  2:39 PM. Always use your most recent med list.          ARTIFICIAL TEARS OP Apply to eye daily as needed.   aspirin 81 MG tablet Take 162 mg by mouth daily.   atenolol 50 MG tablet Commonly known as:  TENORMIN Take  1 tablet (50 mg total) by mouth daily.   azelastine 0.1 % nasal spray Commonly known as:  ASTELIN Place 2 sprays into both nostrils at bedtime as needed for rhinitis. Use in each nostril as directed   cyclobenzaprine 10 MG tablet Commonly known as:  FLEXERIL Take 1 tablet (10 mg total) by mouth at bedtime as needed for muscle spasms.   diphenhydramine-acetaminophen 25-500 MG Tabs tablet Commonly known as:  TYLENOL PM Take 1 tablet by mouth at bedtime as needed.   erythromycin ophthalmic ointment 1 application at bedtime.   fluticasone 50 MCG/ACT nasal spray Commonly known as:  FLONASE Place 1 spray into both nostrils daily as needed for allergies or rhinitis.   folic acid 938 MCG tablet Commonly known as:  FOLVITE Take 400 mcg by mouth daily.   latanoprost 0.005 % ophthalmic solution Commonly known as:  XALATAN Place 1 drop into both eyes at bedtime. Reported on 10/08/2015   multivitamin,tx-minerals tablet Take 1 tablet by mouth daily.   polyethylene glycol  packet Commonly known as:  MIRALAX / GLYCOLAX Take 17 g by mouth daily.   pravastatin 40 MG tablet Commonly known as:  PRAVACHOL TAKE 1 TABLET BY MOUTH  DAILY   timolol 0.5 % ophthalmic solution Commonly known as:  TIMOPTIC          Objective:   Physical Exam BP 126/74 (BP Location: Left Arm, Patient Position: Sitting, Cuff Size: Small)   Pulse 70   Temp 97.9 F (36.6 C) (Oral)   Resp 16   Ht 6' (1.829 m)   Wt 169 lb 2 oz (76.7 kg)   SpO2 97%   BMI 22.94 kg/m  General:   Well developed, NAD, see BMI.  HEENT:  Normocephalic . Face symmetric, atraumatic Both TMs are slightly bulge, worse on the left, no redness, canals normal. Nose clear. Throat symmetric, no red. On gross examination there is a slightly decreased hearing on the left. Skin: Not pale. Not jaundice  Neurologic:  alert & oriented X3.  Speech normal, gait appropriate for age and unassisted Psych--  Cognition and judgment appear intact.  Cooperative with normal attention span and concentration.  Behavior appropriate. No anxious or depressed appearing.      Assessment & Plan:   Assessment Hyperlipidemia Prostate cancer, released from urology 2010, Rx yearly PSAs by PCP HEM:DVT 2001 after surgery, PE 2009, eval by hematology, they recommend DC Coumadin 2011 H/o MVP-- on BB glaucoma Sees dermatology q 6 months   PLAN Decreased hearing, left side: See next Serous otitis: The patient still has evidence of fluid behind both tympanic membranes, slightly worse on the left.  Sinus congestion controlled with Flonase and Astelin.  Previously responded to a prednisone ground.  Plan: Oral prednisone x5 days, call if not better or if symptoms return.  Will need to ENT eval

## 2017-12-01 NOTE — Progress Notes (Signed)
Pre visit review using our clinic review tool, if applicable. No additional management support is needed unless otherwise documented below in the visit note. 

## 2017-12-01 NOTE — Patient Instructions (Signed)
Call if no better  

## 2017-12-03 NOTE — Assessment & Plan Note (Signed)
Decreased hearing, left side: See next Serous otitis: The patient still has evidence of fluid behind both tympanic membranes, slightly worse on the left.  Sinus congestion controlled with Flonase and Astelin.  Previously responded to a prednisone ground.  Plan: Oral prednisone x5 days, call if not better or if symptoms return.  Will need to ENT eval

## 2017-12-06 DIAGNOSIS — H401131 Primary open-angle glaucoma, bilateral, mild stage: Secondary | ICD-10-CM | POA: Diagnosis not present

## 2017-12-15 ENCOUNTER — Telehealth: Payer: Self-pay

## 2017-12-15 DIAGNOSIS — H9193 Unspecified hearing loss, bilateral: Secondary | ICD-10-CM

## 2017-12-15 NOTE — Telephone Encounter (Signed)
Copied from [place] [id number]. Topic: Referral - Request >> Dec 15, 2017 11:11 AM Carolyn Stare wrote:   He also would like to see Dr Andria Frames referral to ENT for fluid build up that is affecting his hearing  he has seen Dr Larose Kells about the issue

## 2017-12-15 NOTE — Telephone Encounter (Signed)
Referral placed.

## 2017-12-29 DIAGNOSIS — H6983 Other specified disorders of Eustachian tube, bilateral: Secondary | ICD-10-CM | POA: Diagnosis not present

## 2017-12-29 DIAGNOSIS — H903 Sensorineural hearing loss, bilateral: Secondary | ICD-10-CM | POA: Diagnosis not present

## 2018-01-06 DIAGNOSIS — H401131 Primary open-angle glaucoma, bilateral, mild stage: Secondary | ICD-10-CM | POA: Diagnosis not present

## 2018-01-09 DIAGNOSIS — L812 Freckles: Secondary | ICD-10-CM | POA: Diagnosis not present

## 2018-01-09 DIAGNOSIS — L570 Actinic keratosis: Secondary | ICD-10-CM | POA: Diagnosis not present

## 2018-01-09 DIAGNOSIS — D485 Neoplasm of uncertain behavior of skin: Secondary | ICD-10-CM | POA: Diagnosis not present

## 2018-01-09 DIAGNOSIS — L821 Other seborrheic keratosis: Secondary | ICD-10-CM | POA: Diagnosis not present

## 2018-01-09 DIAGNOSIS — Z85828 Personal history of other malignant neoplasm of skin: Secondary | ICD-10-CM | POA: Diagnosis not present

## 2018-01-10 DIAGNOSIS — H02055 Trichiasis without entropian left lower eyelid: Secondary | ICD-10-CM | POA: Diagnosis not present

## 2018-01-10 DIAGNOSIS — H401131 Primary open-angle glaucoma, bilateral, mild stage: Secondary | ICD-10-CM | POA: Diagnosis not present

## 2018-01-13 ENCOUNTER — Telehealth: Payer: Self-pay | Admitting: *Deleted

## 2018-01-13 NOTE — Telephone Encounter (Signed)
Received Dermatopathology Report results from [facility]; forwarded to provider/SLS 09/13

## 2018-01-30 ENCOUNTER — Other Ambulatory Visit: Payer: Self-pay | Admitting: Internal Medicine

## 2018-02-15 ENCOUNTER — Ambulatory Visit (INDEPENDENT_AMBULATORY_CARE_PROVIDER_SITE_OTHER): Payer: Medicare Other

## 2018-02-15 DIAGNOSIS — Z23 Encounter for immunization: Secondary | ICD-10-CM | POA: Diagnosis not present

## 2018-04-12 DIAGNOSIS — H02055 Trichiasis without entropian left lower eyelid: Secondary | ICD-10-CM | POA: Diagnosis not present

## 2018-04-12 DIAGNOSIS — H401131 Primary open-angle glaucoma, bilateral, mild stage: Secondary | ICD-10-CM | POA: Diagnosis not present

## 2018-06-05 ENCOUNTER — Other Ambulatory Visit: Payer: Self-pay | Admitting: Internal Medicine

## 2018-06-22 DIAGNOSIS — L820 Inflamed seborrheic keratosis: Secondary | ICD-10-CM | POA: Diagnosis not present

## 2018-06-29 ENCOUNTER — Telehealth: Payer: Self-pay | Admitting: Internal Medicine

## 2018-06-29 NOTE — Telephone Encounter (Signed)
Copied from [place] [id number]. Topic: Quick Communication - Rx Refill/Question >> Jun 29, 2018  1:35 PM Scherrie Gerlach wrote: Medication: prednisone   Pt states the water in his head, behind ears, has returned.  Pt saw Dr Larose Kells back in august 2019 for this issue.  Pt was referred to ENT. But states the issue has returned.  He only saw ENT that one time. Would like Dr Larose Kells to call in the med, as he states now not a good time to come in for appt.  As his wife is very sick .  Dr Larose Kells is aware of his wife's issue, and hope the dr will call in. If not, please call and pt will see what he can do. [facility] [address] [place] [phone] (Phone) [phone] (Fax)

## 2018-06-30 MED ORDER — FLUTICASONE PROPIONATE 50 MCG/ACT NA SUSP: 2.0000 | Freq: Every day | NASAL | 5 refills | Status: DC | PRN

## 2018-06-30 MED ORDER — PREDNISONE 20 MG PO TABS: 20.0000 mg | ORAL_TABLET | Freq: Every day | ORAL | 0 refills | Status: DC

## 2018-06-30 MED ORDER — AZELASTINE HCL 0.1 % NA SOLN: 2.0000 | Freq: Every evening | NASAL | 5 refills | Status: DC | PRN

## 2018-06-30 NOTE — Telephone Encounter (Signed)
Rxs sent

## 2018-06-30 NOTE — Telephone Encounter (Signed)
I agree, his wife is very sick, it will be very hard for him to come to the office. He has a history of serous otitis and ET  dysfunction. Plan: OTC Flonase 2 sprays on each side of the nose daily Astelin 2 sprays of each side of the nose at night, send a prescription Prednisone 20 mg 1 tablet daily #5 no refills. Call if not improving.

## 2018-07-10 DIAGNOSIS — Z85828 Personal history of other malignant neoplasm of skin: Secondary | ICD-10-CM | POA: Diagnosis not present

## 2018-07-10 DIAGNOSIS — L821 Other seborrheic keratosis: Secondary | ICD-10-CM | POA: Diagnosis not present

## 2018-07-10 DIAGNOSIS — D1801 Hemangioma of skin and subcutaneous tissue: Secondary | ICD-10-CM | POA: Diagnosis not present

## 2018-07-10 DIAGNOSIS — L570 Actinic keratosis: Secondary | ICD-10-CM | POA: Diagnosis not present

## 2018-09-16 ENCOUNTER — Other Ambulatory Visit: Payer: Self-pay | Admitting: Internal Medicine

## 2018-10-04 DIAGNOSIS — H02055 Trichiasis without entropian left lower eyelid: Secondary | ICD-10-CM | POA: Diagnosis not present

## 2018-10-04 DIAGNOSIS — H401131 Primary open-angle glaucoma, bilateral, mild stage: Secondary | ICD-10-CM | POA: Diagnosis not present

## 2018-10-04 DIAGNOSIS — H43812 Vitreous degeneration, left eye: Secondary | ICD-10-CM | POA: Diagnosis not present

## 2018-10-09 ENCOUNTER — Ambulatory Visit (INDEPENDENT_AMBULATORY_CARE_PROVIDER_SITE_OTHER): Payer: Medicare Other | Admitting: Internal Medicine

## 2018-10-09 ENCOUNTER — Other Ambulatory Visit: Payer: Self-pay

## 2018-10-09 ENCOUNTER — Encounter: Payer: Self-pay | Admitting: Internal Medicine

## 2018-10-09 VITALS — BP 156/75 | HR 69 | Temp 97.8°F | Resp 16 | Ht 72.0 in | Wt 167.4 lb

## 2018-10-09 DIAGNOSIS — Z0000 Encounter for general adult medical examination without abnormal findings: Secondary | ICD-10-CM

## 2018-10-09 DIAGNOSIS — Z8546 Personal history of malignant neoplasm of prostate: Secondary | ICD-10-CM | POA: Diagnosis not present

## 2018-10-09 DIAGNOSIS — E785 Hyperlipidemia, unspecified: Secondary | ICD-10-CM

## 2018-10-09 LAB — COMPREHENSIVE METABOLIC PANEL
ALT: 16 U/L (ref 0–53)
AST: 17 U/L (ref 0–37)
Albumin: 4.2 g/dL (ref 3.5–5.2)
Alkaline Phosphatase: 67 U/L (ref 39–117)
BUN: 14 mg/dL (ref 6–23)
CO2: 28 mEq/L (ref 19–32)
Calcium: 9.3 mg/dL (ref 8.4–10.5)
Chloride: 107 mEq/L (ref 96–112)
Creatinine, Ser: 1 mg/dL (ref 0.40–1.50)
GFR: 71.05 mL/min (ref >60.00)
Glucose, Bld: 88 mg/dL (ref 70–99)
Potassium: 4.3 mEq/L (ref 3.5–5.1)
Sodium: 142 mEq/L (ref 135–145)
Total Bilirubin: 0.6 mg/dL (ref 0.2–1.2)
Total Protein: 6.7 g/dL (ref 6.0–8.3)

## 2018-10-09 LAB — LIPID PANEL
Cholesterol: 133 mg/dL (ref 0–200)
HDL: 35.5 mg/dL — ABNORMAL LOW (ref >39.00)
LDL Cholesterol: 69 mg/dL (ref 0–99)
NonHDL: 97.2
Total CHOL/HDL Ratio: 4
Triglycerides: 139 mg/dL (ref 0.0–149.0)
VLDL: 27.8 mg/dL (ref 0.0–40.0)

## 2018-10-09 LAB — CBC WITH DIFFERENTIAL/PLATELET
Basophils Absolute: 0 10*3/uL (ref 0.0–0.1)
Basophils Relative: 0.8 % (ref 0.0–3.0)
Eosinophils Absolute: 0.2 10*3/uL (ref 0.0–0.7)
Eosinophils Relative: 5 % (ref 0.0–5.0)
HCT: 44.3 % (ref 39.0–52.0)
Hemoglobin: 15.1 g/dL (ref 13.0–17.0)
Lymphocytes Relative: 18.2 % (ref 12.0–46.0)
Lymphs Abs: 0.8 10*3/uL (ref 0.7–4.0)
MCHC: 34.1 g/dL (ref 30.0–36.0)
MCV: 97.5 fl (ref 78.0–100.0)
Monocytes Absolute: 0.5 10*3/uL (ref 0.1–1.0)
Monocytes Relative: 11.4 % (ref 3.0–12.0)
Neutro Abs: 3 10*3/uL (ref 1.4–7.7)
Neutrophils Relative %: 64.6 % (ref 43.0–77.0)
Platelets: 151 10*3/uL (ref 150.0–400.0)
RBC: 4.54 Mil/uL (ref 4.22–5.81)
RDW: 12.8 % (ref 11.5–15.5)
WBC: 4.6 10*3/uL (ref 4.0–10.5)

## 2018-10-09 LAB — PSA: PSA: 0 ng/mL — ABNORMAL LOW (ref 0.10–4.00)

## 2018-10-09 NOTE — Patient Instructions (Addendum)
Get the blood work     [place] Schedule your next appointment  A yearly check in 1 year      Check the  blood pressure 2 or 3 times a month Be sure your blood pressure is between 110/65 and  135/85. If it is consistently higher or lower, let me know

## 2018-10-09 NOTE — Assessment & Plan Note (Signed)
--  Td  2013; pneumonia shot2006, booster 08-2016; prevnar -- 2016; s/p zostavax;  S/p shingrex #1 and  #2 --CCS: scope 01-2005, had tics, pt electing no further screening   --S/P prostatectomy for prostate cancer around 2000, discharge from urology 2010, Rx yearly PSAs; get a  psa  today --  Diet, exercise: Discussed --Labs: CMP, FLP, CBC,   PSA

## 2018-10-09 NOTE — Progress Notes (Signed)
Subjective:    Patient ID: Brandon Burke, male    DOB: 1931-08-12, 85 y.o.   MRN: [id number]  DOS:  10/09/2018 Type of visit - description: CPX In general feeling well. No major concerns. Due to COVID-19 he has not been too active, has gained weight. No ambulatory BPs.    Review of Systems Specifically denies fever chills No chest pain, difficulty breathing.  No cough Emotionally doing well.  Other than above, a 14 point review of systems is negative     Past Medical History:  Diagnosis Date  . DVT (deep venous thrombosis) ([place])    hx of 2001 (after prostate surgery)  . Hyperlipidemia   . Mitral valve prolapse   . PE (pulmonary embolism)    5-09:was referred to hematology, and they recommend to discontinue Coumadin 5/11  . Prostate cancer [facility])     released from urology in 2010,needs yearly PSAs with PCP    Past Surgical History:  Procedure Laterality Date  . PROSTATECTOMY  2000    Social History   Socioeconomic History  . Marital status: Married    Spouse name: Not on file  . Number of children: 0  . Years of education: Not on file  . Highest education level: Not on file  Occupational History  . Occupation: Retired    Fish farm manager: RETIRED  Social Needs  . Financial resource strain: Not on file  . Food insecurity:    Worry: Not on file    Inability: Not on file  . Transportation needs:    Medical: Not on file    Non-medical: Not on file  Tobacco Use  . Smoking status: Never Smoker  . Smokeless tobacco: Never Used  Substance and Sexual Activity  . Alcohol use: No  . Drug use: No  . Sexual activity: Not on file  Lifestyle  . Physical activity:    Days per week: Not on file    Minutes per session: Not on file  . Stress: Not on file  Relationships  . Social connections:    Talks on phone: Not on file    Gets together: Not on file    Attends religious service: Not on file    Active member of club or organization: Not on file    Attends  meetings of clubs or organizations: Not on file    Relationship status: Not on file  . Intimate partner violence:    Fear of current or ex partner: Not on file    Emotionally abused: Not on file    Physically abused: Not on file    Forced sexual activity: Not on file  Other Topics Concern  . Not on file  Social History Narrative   Married, wife w/ melanoma, advanced, doing well as off 10/2018    no children    Moved to [facility] 03-2016 (  at the independent area [place] off 08-2017)     Family History  Problem Relation Age of Onset  . CAD Father 96  . Diabetes Neg Hx   . Colon cancer Neg Hx   . Prostate cancer Neg Hx      Allergies as of 10/09/2018      Reactions   Nitrofuran Derivatives    headache   Sulfonamide Derivatives Nausea And Vomiting      Medication List       Accurate as of October 09, 2018  1:03 PM. If you have any questions, ask your nurse or doctor.  STOP taking these medications   cyclobenzaprine 10 MG tablet Commonly known as:  FLEXERIL Stopped by:  [name], MD   erythromycin ophthalmic ointment Stopped by:  [name], MD     TAKE these medications   ARTIFICIAL TEARS OP Apply to eye daily as needed.   aspirin 81 MG tablet Take 162 mg by mouth daily.   atenolol 50 MG tablet Commonly known as:  TENORMIN Take 1 tablet (50 mg total) by mouth daily.   azelastine 0.1 % nasal spray Commonly known as:  ASTELIN Place 2 sprays into both nostrils at bedtime as needed for rhinitis or allergies.   diphenhydramine-acetaminophen 25-500 MG Tabs tablet Commonly known as:  TYLENOL PM Take 1 tablet by mouth at bedtime as needed.   fluticasone 50 MCG/ACT nasal spray Commonly known as:  FLONASE Place 2 sprays into both nostrils daily as needed for allergies or rhinitis.   folic acid 338 MCG tablet Commonly known as:  FOLVITE Take 400 mcg by mouth daily.   latanoprost 0.005 % ophthalmic solution Commonly known as:  XALATAN Place 1 drop into  both eyes at bedtime. Reported on 10/08/2015   multivitamin,tx-minerals tablet Take 1 tablet by mouth daily.   polyethylene glycol 17 g packet Commonly known as:  MIRALAX / GLYCOLAX Take 17 g by mouth daily.   pravastatin 40 MG tablet Commonly known as:  PRAVACHOL Take 1 tablet (40 mg total) by mouth daily.   predniSONE 20 MG tablet Commonly known as:  DELTASONE Take 1 tablet (20 mg total) by mouth daily with breakfast.   timolol 0.5 % ophthalmic solution Commonly known as:  TIMOPTIC           Objective:   Physical Exam BP (!) 156/75 (BP Location: Left Arm, Patient Position: Sitting, Cuff Size: Small)   Pulse 69   Temp 97.8 F (36.6 C) (Oral)   Resp 16   Ht 6' (1.829 m)   Wt 167 lb 6 oz (75.9 kg)   SpO2 100%   BMI 22.70 kg/m  General:   Well developed, NAD, BMI noted.  HEENT:  Normocephalic . Face symmetric, atraumatic Lungs:  CTA B Normal respiratory effort, no intercostal retractions, no accessory muscle use. Heart: RRR,  no murmur.  no pretibial edema bilaterally  Abdomen:  Not distended, soft, non-tender. No rebound or rigidity.   Skin: Not pale. Not jaundice Neurologic:  alert & oriented X3.  Speech normal, gait appropriate for age and unassisted Psych--  Cognition and judgment appear intact.  Cooperative with normal attention span and concentration.  Behavior appropriate. No anxious or depressed appearing.     Assessment     Assessment Hyperlipidemia Prostate cancer, released from urology 2010, Rx yearly PSAs by PCP HEM:DVT 2001 after surgery, PE 2009, eval by hematology, they recommend DC Coumadin 2011 H/o MVP-- on BB glaucoma Sees dermatology q 6 months   PLAN Here for CPX Hyperlipidemia: On Pravachol, check a CMP, FLP. History of prostate cancer: Asymptomatic, check a PSA Glaucoma: Follow-up by ophthalmology COVID-19: Lives in a retirement community, thankfully everybody has been healthy.  He follows excellent precautions. Slightly  elevated BP today: Recommend to monitor BPs at home.  See AVS RTC 1 year

## 2018-10-09 NOTE — Assessment & Plan Note (Signed)
Here for CPX Hyperlipidemia: On Pravachol, check a CMP, FLP. History of prostate cancer: Asymptomatic, check a PSA Glaucoma: Follow-up by ophthalmology COVID-19: Lives in a retirement community, thankfully everybody has been healthy.  He follows excellent precautions. Slightly elevated BP today: Recommend to monitor BPs at home.  See AVS RTC 1 year

## 2018-11-05 ENCOUNTER — Encounter: Payer: Self-pay | Admitting: Internal Medicine

## 2018-11-21 DIAGNOSIS — Z85828 Personal history of other malignant neoplasm of skin: Secondary | ICD-10-CM | POA: Diagnosis not present

## 2018-11-21 DIAGNOSIS — L218 Other seborrheic dermatitis: Secondary | ICD-10-CM | POA: Diagnosis not present

## 2018-11-21 DIAGNOSIS — L570 Actinic keratosis: Secondary | ICD-10-CM | POA: Diagnosis not present

## 2018-12-09 ENCOUNTER — Other Ambulatory Visit: Payer: Self-pay | Admitting: Internal Medicine

## 2019-01-16 DIAGNOSIS — L570 Actinic keratosis: Secondary | ICD-10-CM | POA: Diagnosis not present

## 2019-01-16 DIAGNOSIS — Z85828 Personal history of other malignant neoplasm of skin: Secondary | ICD-10-CM | POA: Diagnosis not present

## 2019-01-16 DIAGNOSIS — L812 Freckles: Secondary | ICD-10-CM | POA: Diagnosis not present

## 2019-01-16 DIAGNOSIS — D485 Neoplasm of uncertain behavior of skin: Secondary | ICD-10-CM | POA: Diagnosis not present

## 2019-01-16 DIAGNOSIS — L821 Other seborrheic keratosis: Secondary | ICD-10-CM | POA: Diagnosis not present

## 2019-01-16 DIAGNOSIS — L578 Other skin changes due to chronic exposure to nonionizing radiation: Secondary | ICD-10-CM | POA: Diagnosis not present

## 2019-01-16 DIAGNOSIS — D045 Carcinoma in situ of skin of trunk: Secondary | ICD-10-CM | POA: Diagnosis not present

## 2019-01-29 DIAGNOSIS — Z23 Encounter for immunization: Secondary | ICD-10-CM | POA: Diagnosis not present

## 2019-04-06 DIAGNOSIS — H43812 Vitreous degeneration, left eye: Secondary | ICD-10-CM | POA: Diagnosis not present

## 2019-04-06 DIAGNOSIS — H0100A Unspecified blepharitis right eye, upper and lower eyelids: Secondary | ICD-10-CM | POA: Diagnosis not present

## 2019-04-06 DIAGNOSIS — H401131 Primary open-angle glaucoma, bilateral, mild stage: Secondary | ICD-10-CM | POA: Diagnosis not present

## 2019-04-06 DIAGNOSIS — H02055 Trichiasis without entropian left lower eyelid: Secondary | ICD-10-CM | POA: Diagnosis not present

## 2019-05-07 ENCOUNTER — Other Ambulatory Visit: Payer: Self-pay | Admitting: Internal Medicine

## 2019-05-08 ENCOUNTER — Other Ambulatory Visit: Payer: Self-pay | Admitting: Internal Medicine

## 2019-05-14 ENCOUNTER — Other Ambulatory Visit: Payer: Self-pay | Admitting: *Deleted

## 2019-05-14 MED ORDER — PRAVASTATIN SODIUM 40 MG PO TABS: 40.0000 mg | ORAL_TABLET | Freq: Every day | ORAL | 3 refills | Status: DC

## 2019-06-12 DIAGNOSIS — L570 Actinic keratosis: Secondary | ICD-10-CM | POA: Diagnosis not present

## 2019-06-12 DIAGNOSIS — L820 Inflamed seborrheic keratosis: Secondary | ICD-10-CM | POA: Diagnosis not present

## 2019-06-12 DIAGNOSIS — Z85828 Personal history of other malignant neoplasm of skin: Secondary | ICD-10-CM | POA: Diagnosis not present

## 2019-06-12 DIAGNOSIS — L821 Other seborrheic keratosis: Secondary | ICD-10-CM | POA: Diagnosis not present

## 2019-06-12 DIAGNOSIS — L218 Other seborrheic dermatitis: Secondary | ICD-10-CM | POA: Diagnosis not present

## 2019-07-26 ENCOUNTER — Telehealth: Payer: Self-pay | Admitting: Internal Medicine

## 2019-07-26 MED ORDER — ATENOLOL 50 MG PO TABS: 50.0000 mg | ORAL_TABLET | Freq: Every day | ORAL | 1 refills | Status: DC

## 2019-07-26 NOTE — Telephone Encounter (Signed)
Patient is requesting a change in Pharmacies, Patient is now using,for all medications.  Herbalist [facility]) - [place], [place] Phone:  [phone]  Fax:  [phone]

## 2019-07-26 NOTE — Telephone Encounter (Signed)
Rx sent 

## 2019-07-26 NOTE — Telephone Encounter (Signed)
Noted  

## 2019-07-26 NOTE — Telephone Encounter (Signed)
Medication: atenolol (TENORMIN) 50 MG tablet    Has the patient contacted their pharmacy? No. (If no, request that the patient contact the pharmacy for the refill.) (If yes, when and what did the pharmacy advise?)  Preferred Pharmacy (with phone number or street name):  [facility]) - [place], [place] Phone:  [phone]  Fax:  [phone]       Agent: Please be advised that RX refills may take up to 3 business days. We ask that you follow-up with your pharmacy.

## 2019-07-27 DIAGNOSIS — H0100A Unspecified blepharitis right eye, upper and lower eyelids: Secondary | ICD-10-CM | POA: Diagnosis not present

## 2019-07-27 DIAGNOSIS — H401131 Primary open-angle glaucoma, bilateral, mild stage: Secondary | ICD-10-CM | POA: Diagnosis not present

## 2019-08-06 DIAGNOSIS — H6123 Impacted cerumen, bilateral: Secondary | ICD-10-CM | POA: Diagnosis not present

## 2019-08-08 ENCOUNTER — Other Ambulatory Visit: Payer: Self-pay | Admitting: Internal Medicine

## 2019-08-09 ENCOUNTER — Encounter (HOSPITAL_BASED_OUTPATIENT_CLINIC_OR_DEPARTMENT_OTHER): Payer: Self-pay | Admitting: *Deleted

## 2019-08-09 ENCOUNTER — Observation Stay (HOSPITAL_BASED_OUTPATIENT_CLINIC_OR_DEPARTMENT_OTHER)
Admission: EM | Admit: 2019-08-09 | Discharge: 2019-08-11 | Disposition: A | Discharge status: Home / Self Care | Payer: PPO | Attending: Internal Medicine | Admitting: Internal Medicine

## 2019-08-09 ENCOUNTER — Other Ambulatory Visit: Payer: Self-pay

## 2019-08-09 ENCOUNTER — Emergency Department (HOSPITAL_BASED_OUTPATIENT_CLINIC_OR_DEPARTMENT_OTHER): Payer: PPO

## 2019-08-09 DIAGNOSIS — H269 Unspecified cataract: Secondary | ICD-10-CM | POA: Diagnosis not present

## 2019-08-09 DIAGNOSIS — Z743 Need for continuous supervision: Secondary | ICD-10-CM | POA: Diagnosis not present

## 2019-08-09 DIAGNOSIS — I341 Nonrheumatic mitral (valve) prolapse: Secondary | ICD-10-CM | POA: Insufficient documentation

## 2019-08-09 DIAGNOSIS — Z882 Allergy status to sulfonamides status: Secondary | ICD-10-CM | POA: Diagnosis not present

## 2019-08-09 DIAGNOSIS — Z20822 Contact with and (suspected) exposure to covid-19: Secondary | ICD-10-CM | POA: Insufficient documentation

## 2019-08-09 DIAGNOSIS — H1132 Conjunctival hemorrhage, left eye: Secondary | ICD-10-CM | POA: Diagnosis not present

## 2019-08-09 DIAGNOSIS — W19XXXA Unspecified fall, initial encounter: Secondary | ICD-10-CM

## 2019-08-09 DIAGNOSIS — S0990XA Unspecified injury of head, initial encounter: Secondary | ICD-10-CM

## 2019-08-09 DIAGNOSIS — Z7982 Long term (current) use of aspirin: Secondary | ICD-10-CM | POA: Insufficient documentation

## 2019-08-09 DIAGNOSIS — S0292XA Unspecified fracture of facial bones, initial encounter for closed fracture: Secondary | ICD-10-CM | POA: Diagnosis present

## 2019-08-09 DIAGNOSIS — S0512XA Contusion of eyeball and orbital tissues, left eye, initial encounter: Secondary | ICD-10-CM

## 2019-08-09 DIAGNOSIS — Z79899 Other long term (current) drug therapy: Secondary | ICD-10-CM | POA: Diagnosis not present

## 2019-08-09 DIAGNOSIS — Z9079 Acquired absence of other genital organ(s): Secondary | ICD-10-CM | POA: Insufficient documentation

## 2019-08-09 DIAGNOSIS — S0230XA Fracture of orbital floor, unspecified side, initial encounter for closed fracture: Secondary | ICD-10-CM

## 2019-08-09 DIAGNOSIS — S0232XA Fracture of orbital floor, left side, initial encounter for closed fracture: Principal | ICD-10-CM | POA: Insufficient documentation

## 2019-08-09 DIAGNOSIS — R58 Hemorrhage, not elsewhere classified: Secondary | ICD-10-CM | POA: Diagnosis not present

## 2019-08-09 DIAGNOSIS — R112 Nausea with vomiting, unspecified: Secondary | ICD-10-CM | POA: Diagnosis not present

## 2019-08-09 DIAGNOSIS — W010XXA Fall on same level from slipping, tripping and stumbling without subsequent striking against object, initial encounter: Secondary | ICD-10-CM | POA: Insufficient documentation

## 2019-08-09 DIAGNOSIS — S0191XA Laceration without foreign body of unspecified part of head, initial encounter: Secondary | ICD-10-CM

## 2019-08-09 DIAGNOSIS — Z86711 Personal history of pulmonary embolism: Secondary | ICD-10-CM | POA: Insufficient documentation

## 2019-08-09 DIAGNOSIS — Z86718 Personal history of other venous thrombosis and embolism: Secondary | ICD-10-CM | POA: Diagnosis not present

## 2019-08-09 DIAGNOSIS — I10 Essential (primary) hypertension: Secondary | ICD-10-CM | POA: Insufficient documentation

## 2019-08-09 DIAGNOSIS — Z8546 Personal history of malignant neoplasm of prostate: Secondary | ICD-10-CM | POA: Diagnosis not present

## 2019-08-09 DIAGNOSIS — M1990 Unspecified osteoarthritis, unspecified site: Secondary | ICD-10-CM | POA: Insufficient documentation

## 2019-08-09 DIAGNOSIS — S0181XA Laceration without foreign body of other part of head, initial encounter: Secondary | ICD-10-CM | POA: Diagnosis not present

## 2019-08-09 DIAGNOSIS — I6782 Cerebral ischemia: Secondary | ICD-10-CM | POA: Diagnosis not present

## 2019-08-09 DIAGNOSIS — E785 Hyperlipidemia, unspecified: Secondary | ICD-10-CM | POA: Insufficient documentation

## 2019-08-09 DIAGNOSIS — S01112A Laceration without foreign body of left eyelid and periocular area, initial encounter: Secondary | ICD-10-CM | POA: Diagnosis not present

## 2019-08-09 DIAGNOSIS — S0003XA Contusion of scalp, initial encounter: Secondary | ICD-10-CM | POA: Diagnosis not present

## 2019-08-09 LAB — CBC WITH DIFFERENTIAL/PLATELET
Abs Immature Granulocytes: 0.04 10*3/uL (ref 0.00–0.07)
Basophils Absolute: 0 10*3/uL (ref 0.0–0.1)
Basophils Relative: 0 %
Eosinophils Absolute: 0.1 10*3/uL (ref 0.0–0.5)
Eosinophils Relative: 1 %
HCT: 43.4 % (ref 39.0–52.0)
Hemoglobin: 15.2 g/dL (ref 13.0–17.0)
Immature Granulocytes: 0 %
Lymphocytes Relative: 8 %
Lymphs Abs: 1 10*3/uL (ref 0.7–4.0)
MCH: 33.6 pg (ref 26.0–34.0)
MCHC: 35 g/dL (ref 30.0–36.0)
MCV: 95.8 fL (ref 80.0–100.0)
Monocytes Absolute: 0.8 10*3/uL (ref 0.1–1.0)
Monocytes Relative: 7 %
Neutro Abs: 9.6 10*3/uL — ABNORMAL HIGH (ref 1.7–7.7)
Neutrophils Relative %: 84 %
Platelets: 161 10*3/uL (ref 150–400)
RBC: 4.53 MIL/uL (ref 4.22–5.81)
RDW: 12.1 % (ref 11.5–15.5)
WBC: 11.6 10*3/uL — ABNORMAL HIGH (ref 4.0–10.5)
nRBC: 0 % (ref 0.0–0.2)

## 2019-08-09 LAB — BASIC METABOLIC PANEL
Anion gap: 10 (ref 5–15)
BUN: 21 mg/dL (ref 8–23)
CO2: 27 mmol/L (ref 22–32)
Calcium: 9.4 mg/dL (ref 8.9–10.3)
Chloride: 103 mmol/L (ref 98–111)
Creatinine, Ser: 1.2 mg/dL (ref 0.61–1.24)
GFR calc Af Amer: >60 mL/min (ref >60)
GFR calc non Af Amer: 55 mL/min — ABNORMAL LOW (ref >60)
Glucose, Bld: 116 mg/dL — ABNORMAL HIGH (ref 70–99)
Potassium: 4.1 mmol/L (ref 3.5–5.1)
Sodium: 140 mmol/L (ref 135–145)

## 2019-08-09 LAB — PROTIME-INR
INR: 1 (ref 0.8–1.2)
Prothrombin Time: 13.2 seconds (ref 11.4–15.2)

## 2019-08-09 MED ORDER — ONDANSETRON HCL 4 MG/2ML IJ SOLN
4.0000 mg | Freq: Once | INTRAMUSCULAR | Status: AC
  Administered 2019-08-09: 20:00:00 4 mg via INTRAVENOUS
  Filled 2019-08-09: qty 2

## 2019-08-09 MED ORDER — ERYTHROMYCIN 5 MG/GM OP OINT
TOPICAL_OINTMENT | Freq: Once | OPHTHALMIC | Status: AC
  Administered 2019-08-09: 1 via OPHTHALMIC
  Filled 2019-08-09: qty 3.5

## 2019-08-09 MED ORDER — MORPHINE SULFATE (PF) 4 MG/ML IV SOLN
4.0000 mg | Freq: Once | INTRAVENOUS | Status: AC
  Administered 2019-08-09: 23:00:00 4 mg via INTRAVENOUS
  Filled 2019-08-09: qty 1

## 2019-08-09 MED ORDER — MORPHINE SULFATE (PF) 4 MG/ML IV SOLN
4.0000 mg | Freq: Once | INTRAVENOUS | Status: AC
  Administered 2019-08-09: 20:00:00 4 mg via INTRAVENOUS
  Filled 2019-08-09: qty 1

## 2019-08-09 NOTE — ED Notes (Signed)
Patient c/o of nausea and vomited, moderate amount of bloody vomitus.

## 2019-08-09 NOTE — ED Provider Notes (Signed)
[name] DEPARTMENT Provider Note   CSN: [id number] Arrival date & time: 08/09/19  1759     History Chief Complaint  Patient presents with  . Fall    Brandon Burke is a 86 y.o. male.  Patient is an 86 year old male with past medical history of pulmonary embolism, hyperlipidemia, arthritis, and prostate cancer.  Patient presents today by EMS for evaluation of fall.  He was in his yard pulling the garden hose when he tripped over it, fell, and struck his head on a large branch that was on the ground he had recently cut up.  He is unsure about loss of consciousness, but does complain of pain in his head and around his left eye.  He has a laceration next to the left eye.  He denies neck pain.  He denies other injury.  The history is provided by the patient and the EMS personnel.  Fall This is a new problem. The current episode started less than 1 hour ago. The problem occurs constantly. The problem has not changed since onset.Associated symptoms include headaches. Nothing aggravates the symptoms. Nothing relieves the symptoms. He has tried nothing for the symptoms.       Past Medical History:  Diagnosis Date  . DVT (deep venous thrombosis) ([place])    hx of 2001 (after prostate surgery)  . Hyperlipidemia   . Mitral valve prolapse   . PE (pulmonary embolism)    5-09:was referred to hematology, and they recommend to discontinue Coumadin 5/11  . Prostate cancer [facility])     released from urology in 2010,needs yearly PSAs with PCP    Patient Active Problem List   Diagnosis Date Noted  . PCP NOTES >>>>>>>>>>>>>>> 10/09/2015  . Allergic rhinitis 02/26/2014  . Headache(784.0) 03/09/2012  . Annual physical exam 06/22/2011  . Osteoarthritis 12/15/2009  . Insomnia 06/02/2009  . History of pulmonary embolism 09/22/2007  . DVT, HX OF 09/13/2007  . Dyslipidemia 10/27/2006  . PROSTATE CANCER, HX OF 10/27/2006  . MVP (mitral valve prolapse) 10/05/2006    Past  Surgical History:  Procedure Laterality Date  . PROSTATECTOMY  2000       Family History  Problem Relation Age of Onset  . CAD Father 82  . Diabetes Neg Hx   . Colon cancer Neg Hx   . Prostate cancer Neg Hx     Social History   Tobacco Use  . Smoking status: Never Smoker  . Smokeless tobacco: Never Used  Substance Use Topics  . Alcohol use: No  . Drug use: No    Home Medications Prior to Admission medications   Medication Sig Start Date End Date Taking? Authorizing Provider  fluticasone (FLONASE) 50 MCG/ACT nasal spray Place 2 sprays into both nostrils daily as needed for allergies or rhinitis. 08/08/19   Colon Branch, MD  aspirin 81 MG tablet Take 162 mg by mouth daily.    [provider]  atenolol (TENORMIN) 50 MG tablet Take 1 tablet (50 mg total) by mouth daily. 07/26/19   Colon Branch, MD  azelastine (ASTELIN) 0.1 % nasal spray Place 2 sprays into both nostrils at bedtime as needed for rhinitis or allergies. 06/30/18   Colon Branch, MD  diphenhydramine-acetaminophen (TYLENOL PM) 25-500 MG TABS Take 1 tablet by mouth at bedtime as needed.    [provider]  folic acid (FOLVITE) A999333 MCG tablet Take 400 mcg by mouth daily.      [provider]  Hypromellose (ARTIFICIAL  TEARS OP) Apply to eye daily as needed.    [provider]  latanoprost (XALATAN) 0.005 % ophthalmic solution Place 1 drop into both eyes at bedtime. Reported on 10/08/2015    [provider]  Multiple Vitamins-Minerals (MULTIVITAMIN,TX-MINERALS) tablet Take 1 tablet by mouth daily.      [provider]  polyethylene glycol (MIRALAX / GLYCOLAX) packet Take 17 g by mouth daily.    [provider]  pravastatin (PRAVACHOL) 40 MG tablet Take 1 tablet (40 mg total) by mouth daily. 05/14/19   Colon Branch, MD  predniSONE (DELTASONE) 20 MG tablet Take 1 tablet (20 mg total) by mouth daily with breakfast. Patient not taking: Reported on 10/09/2018 06/30/18   Colon Branch, MD  timolol (TIMOPTIC) 0.5 % ophthalmic solution  06/10/17   [provider]    Allergies    Nitrofuran derivatives and Sulfonamide derivatives  Review of Systems   Review of Systems  Neurological: Positive for headaches.  All other systems reviewed and are negative.   Physical Exam Updated Vital Signs BP (!) 172/81 (BP Location: Right Arm)   Pulse 64   Temp 98.5 F (36.9 C) (Oral)   Resp 16   SpO2 98%   Physical Exam Vitals and nursing note reviewed.  Constitutional:      General: He is not in acute distress.    Appearance: He is well-developed. He is not diaphoretic.  HENT:     Head: Normocephalic.     Comments: There is a 2.5 cm laceration above the right eyebrow.  There is a macerated area of tissue lateral to the left eye.  There is periorbital ecchymosis present.  There is also some conjunctival blood and what appears to be a hyphema. Cardiovascular:     Rate and Rhythm: Normal rate and regular rhythm.     Heart sounds: No murmur. No friction rub.  Pulmonary:     Effort: Pulmonary effort is normal. No respiratory distress.     Breath sounds: Normal breath sounds. No wheezing or rales.  Abdominal:     General: Bowel sounds are normal. There is no distension.     Palpations: Abdomen is soft.     Tenderness: There is no abdominal tenderness.  Musculoskeletal:        General: Normal range of motion.     Cervical back: Normal range of motion and neck supple.  Skin:    General: Skin is warm and dry.  Neurological:     General: No focal deficit present.     Mental Status: He is alert and oriented to person, place, and time.     Cranial Nerves: No cranial nerve deficit.     Motor: No weakness.     Coordination: Coordination normal.         ED Results / Procedures / Treatments   Labs (all labs ordered are listed, but only abnormal results are displayed) Labs Reviewed  BASIC METABOLIC PANEL  CBC WITH DIFFERENTIAL/PLATELET  PROTIME-INR     EKG None  Radiology No results found.  Procedures Procedures (including critical care time)  Medications Ordered in ED Medications - No data to display  ED Course  I have reviewed the triage vital signs and the nursing notes.  Pertinent labs & imaging results that were available during my care of the patient were reviewed by me and considered in my medical decision making (see chart for details).    MDM Rules/Calculators/A&P  Patient is an 86 year old male with  past history of PE, mitral valve prolapse, and prostate cancer.  He was brought here by EMS after a fall that occurred while working in the yard.  He struck his head on a log and has a complex laceration lateral to his left eye.    Initial exam reveals subconjunctival blood and I suspect a small hyphema, however exam is somewhat difficult secondary to discomfort and swelling.  He does have decreased visual acuity stating that he can only see light, but cannot make out shapes or faces.  Imaging studies obtained in the ER reveal blowout fractures of the inferior and medial walls of the orbit as well as what appears to be a small focus of hemorrhage in the posterior chamber of the left globe.  These findings were discussed with Dr. Posey Pronto from ophthalmology.  He felt as though these eye injuries were able to be safely followed up in the office the next day.  I also discussed the patient with Dr. Marla Roe from facial trauma.  She is recommending eye ointment to the laceration left of the eye.  The orbital fractures she feels can be followed up as an outpatient as well.  After speaking with the above physicians, the patient began to feel nauseated and vomited.  For a short period of time, he seemed somewhat sluggish and slow to respond.  I suspect he is concussed and feel as though will require further observation.  I spoke with Dr. Redmond Pulling from trauma surgery who feels as though patient can be observed on the medicine  service.  I have spoken with Dr. Hal Hope who agrees to admit.  A dressing has been applied to the laceration and patient to be admitted to [facility].  CRITICAL CARE Performed by: Veryl Speak Total critical care time: 60 minutes Critical care time was exclusive of separately billable procedures and treating other patients. Critical care was necessary to treat or prevent imminent or life-threatening deterioration. Critical care was time spent personally by me on the following activities: development of treatment plan with patient and/or surrogate as well as nursing, discussions with consultants, evaluation of patient's response to treatment, examination of patient, obtaining history from patient or surrogate, ordering and performing treatments and interventions, ordering and review of laboratory studies, ordering and review of radiographic studies, pulse oximetry and re-evaluation of patient's condition.   Final Clinical Impression(s) / ED Diagnoses Final diagnoses:  None    Rx / DC Orders ED Discharge Orders    None       Veryl Speak, MD 08/09/19 2250

## 2019-08-09 NOTE — ED Notes (Signed)
ED Provider at bedside. 

## 2019-08-09 NOTE — ED Triage Notes (Signed)
Patient was grabbing the garden hose and his feet got tangled and fell.  Denies dizziness, c/o slight nausea.

## 2019-08-09 NOTE — ED Notes (Signed)
Hemastatic dressing placed on forehead

## 2019-08-09 NOTE — ED Notes (Signed)
EDP made aware that patient vomited bloody vomitus and small nasal bleeding.

## 2019-08-10 DIAGNOSIS — S0191XA Laceration without foreign body of unspecified part of head, initial encounter: Secondary | ICD-10-CM

## 2019-08-10 DIAGNOSIS — S0230XA Fracture of orbital floor, unspecified side, initial encounter for closed fracture: Secondary | ICD-10-CM | POA: Diagnosis not present

## 2019-08-10 DIAGNOSIS — W19XXXA Unspecified fall, initial encounter: Secondary | ICD-10-CM | POA: Diagnosis not present

## 2019-08-10 LAB — SARS CORONAVIRUS 2 (TAT 6-24 HRS): SARS Coronavirus 2: NEGATIVE

## 2019-08-10 MED ORDER — ONDANSETRON HCL 4 MG/2ML IJ SOLN
4.0000 mg | Freq: Four times a day (QID) | INTRAMUSCULAR | Status: DC | PRN
  Administered 2019-08-10: 18:00:00 4 mg via INTRAVENOUS
  Filled 2019-08-10: qty 2

## 2019-08-10 MED ORDER — FLUTICASONE PROPIONATE 50 MCG/ACT NA SUSP
2.0000 | Freq: Every day | NASAL | Status: DC | PRN
  Filled 2019-08-10: qty 16

## 2019-08-10 MED ORDER — ADULT MULTIVITAMIN W/MINERALS CH
1.0000 | ORAL_TABLET | Freq: Every day | ORAL | Status: DC
  Administered 2019-08-11: 12:00:00 1 via ORAL
  Filled 2019-08-10: qty 1

## 2019-08-10 MED ORDER — LATANOPROST 0.005 % OP SOLN
1.0000 [drp] | Freq: Every day | OPHTHALMIC | Status: DC
  Administered 2019-08-10: 21:00:00 1 [drp] via OPHTHALMIC
  Filled 2019-08-10: qty 2.5

## 2019-08-10 MED ORDER — HYDROMORPHONE HCL 1 MG/ML IJ SOLN
0.5000 mg | INTRAMUSCULAR | Status: DC | PRN
  Administered 2019-08-10 – 2019-08-11 (×3): 0.5 mg via INTRAVENOUS
  Filled 2019-08-10 (×3): qty 1

## 2019-08-10 MED ORDER — HYDROMORPHONE HCL 1 MG/ML IJ SOLN
0.5000 mg | Freq: Once | INTRAMUSCULAR | Status: AC
  Administered 2019-08-10: 09:00:00 0.5 mg via INTRAVENOUS
  Filled 2019-08-10: qty 1

## 2019-08-10 MED ORDER — POLYETHYLENE GLYCOL 3350 17 G PO PACK
17.0000 g | PACK | Freq: Every day | ORAL | Status: DC
  Administered 2019-08-10 – 2019-08-11 (×2): 17 g via ORAL
  Filled 2019-08-10 (×2): qty 1

## 2019-08-10 MED ORDER — HYDROMORPHONE HCL 1 MG/ML IJ SOLN
0.5000 mg | Freq: Once | INTRAMUSCULAR | Status: AC
  Administered 2019-08-10: 02:00:00 0.5 mg via INTRAVENOUS
  Filled 2019-08-10: qty 1

## 2019-08-10 MED ORDER — POLYVINYL ALCOHOL 1.4 % OP SOLN
1.0000 [drp] | OPHTHALMIC | Status: DC | PRN
  Filled 2019-08-10: qty 15

## 2019-08-10 MED ORDER — ASPIRIN 81 MG PO CHEW
162.0000 mg | CHEWABLE_TABLET | Freq: Every day | ORAL | Status: DC
  Administered 2019-08-11: 12:00:00 162 mg via ORAL
  Filled 2019-08-10: qty 2

## 2019-08-10 MED ORDER — ONDANSETRON HCL 4 MG/2ML IJ SOLN
4.0000 mg | Freq: Once | INTRAMUSCULAR | Status: AC
  Administered 2019-08-10: 05:00:00 4 mg via INTRAVENOUS
  Filled 2019-08-10: qty 2

## 2019-08-10 MED ORDER — ATENOLOL 50 MG PO TABS
50.0000 mg | ORAL_TABLET | Freq: Every day | ORAL | Status: DC
  Administered 2019-08-10: 15:00:00 50 mg via ORAL
  Filled 2019-08-10 (×2): qty 1

## 2019-08-10 MED ORDER — PRAVASTATIN SODIUM 40 MG PO TABS
40.0000 mg | ORAL_TABLET | Freq: Every day | ORAL | Status: DC
  Administered 2019-08-10: 21:00:00 40 mg via ORAL
  Filled 2019-08-10: qty 1

## 2019-08-10 MED ORDER — ACETAMINOPHEN 650 MG RE SUPP: 650.0000 mg | Freq: Four times a day (QID) | RECTAL | Status: DC | PRN

## 2019-08-10 MED ORDER — SODIUM CHLORIDE 0.9 % IV SOLN: INTRAVENOUS | Status: DC

## 2019-08-10 MED ORDER — AZELASTINE HCL 0.1 % NA SOLN
2.0000 | Freq: Every evening | NASAL | Status: DC | PRN
  Filled 2019-08-10: qty 30

## 2019-08-10 MED ORDER — ACETAMINOPHEN 325 MG PO TABS: 650.0000 mg | ORAL_TABLET | Freq: Four times a day (QID) | ORAL | Status: DC | PRN

## 2019-08-10 MED ORDER — TIMOLOL MALEATE 0.5 % OP SOLN
1.0000 [drp] | Freq: Two times a day (BID) | OPHTHALMIC | Status: DC
  Administered 2019-08-10 – 2019-08-11 (×2): 1 [drp] via OPHTHALMIC
  Filled 2019-08-10 (×2): qty 5

## 2019-08-10 MED ORDER — FOLIC ACID 1 MG PO TABS
0.5000 mg | ORAL_TABLET | Freq: Every day | ORAL | Status: DC
  Administered 2019-08-10: 21:00:00 0.5 mg via ORAL
  Filled 2019-08-10 (×3): qty 1

## 2019-08-10 MED ORDER — KETOROLAC TROMETHAMINE 15 MG/ML IJ SOLN
15.0000 mg | Freq: Four times a day (QID) | INTRAMUSCULAR | Status: DC | PRN
  Administered 2019-08-10 – 2019-08-11 (×2): 15 mg via INTRAVENOUS
  Filled 2019-08-10 (×2): qty 1

## 2019-08-10 MED ORDER — MELATONIN 3 MG PO TABS
3.0000 mg | ORAL_TABLET | Freq: Every day | ORAL | Status: DC
  Administered 2019-08-10: 3 mg via ORAL
  Filled 2019-08-10: qty 1

## 2019-08-10 NOTE — H&P (Signed)
History and Physical    Brandon Burke [facility] [id number] DOB: Oct 24, 1931 DOA: 08/09/2019  PCP: Colon Branch, MD   Patient coming from: Home  I have personally briefly reviewed patient's old medical records in [place]  Chief Complaint: I fell and hit my face  HPI: Brandon Burke is a 86 y.o. male with medical history significant of remote Hx of DVT and pulmonary embolism (2011), hyperlipidemia, Cataract, and remote Hx of prostate cancer presented to [facility] ER for for evaluation of fall.  He was in his yard pulling the garden hose when he tripped over it, fell, and struck his right forehead on a large branch.  He is unsure about whether he lost consciousness, but does have severe pain in his head and around his left eye.  He has a laceration next to the left eye.  He denies neck pain.  He denies other injury or pain. ED Course: CT showed blowout fractures of the inferior and medial walls of the orbit as well as what appears to be a small focus of hemorrhage in the posterior chamber of the left globe.ER physician discussed with ophthalmologist Dr. Posey Pronto who advised follow-up as outpatient. Dr. Trish Fountain facial trauma surgeon advised follow-up but since patient became more drowsy and started vomiting was planning to be admitted and likely has concussion symptoms.   Review of Systems: As per HPI otherwise 10 point review of systems negative.    Past Medical History:  Diagnosis Date  . DVT (deep venous thrombosis) ([place])    hx of 2001 (after prostate surgery)  . Hyperlipidemia   . Mitral valve prolapse   . PE (pulmonary embolism)    5-09:was referred to hematology, and they recommend to discontinue Coumadin 5/11  . Prostate cancer [facility])     released from urology in 2010,needs yearly PSAs with PCP    Past Surgical History:  Procedure Laterality Date  . PROSTATECTOMY  2000     reports that he has never smoked. He has never used smokeless tobacco. He reports that he  does not drink alcohol or use drugs.  Allergies  Allergen Reactions  . Nitrofuran Derivatives     headache  . Sulfonamide Derivatives Nausea And Vomiting    Family History  Problem Relation Age of Onset  . CAD Father 14  . Diabetes Neg Hx   . Colon cancer Neg Hx   . Prostate cancer Neg Hx     Prior to Admission medications   Medication Sig Start Date End Date Taking? Authorizing Provider  aspirin 81 MG tablet Take 162 mg by mouth daily.   Yes [provider]  atenolol (TENORMIN) 50 MG tablet Take 1 tablet (50 mg total) by mouth daily. 07/26/19  Yes Paz, Alda Berthold, MD  azelastine (ASTELIN) 0.1 % nasal spray Place 2 sprays into both nostrils at bedtime as needed for rhinitis or allergies. 06/30/18  Yes Paz, Alda Berthold, MD  diphenhydramine-acetaminophen (TYLENOL PM) 25-500 MG TABS Take 1 tablet by mouth at bedtime as needed.   Yes [provider]  fluticasone (FLONASE) 50 MCG/ACT nasal spray Place 2 sprays into both nostrils daily as needed for allergies or rhinitis. 08/08/19  Yes Paz, Alda Berthold, MD  folic acid (FOLVITE) A999333 MCG tablet Take 400 mcg by mouth at bedtime.    Yes [provider]  Hypromellose (ARTIFICIAL TEARS OP) Apply to eye daily as needed.   Yes [provider]  latanoprost (XALATAN) 0.005 % ophthalmic solution Place 1 drop  into both eyes at bedtime. Reported on 10/08/2015   Yes [provider]  Multiple Vitamins-Minerals (MULTIVITAMIN,TX-MINERALS) tablet Take 1 tablet by mouth daily.     Yes [provider]  polyethylene glycol (MIRALAX / GLYCOLAX) packet Take 17 g by mouth daily.   Yes [provider]  pravastatin (PRAVACHOL) 40 MG tablet Take 1 tablet (40 mg total) by mouth daily. Patient taking differently: Take 40 mg by mouth at bedtime.  05/14/19  Yes Paz, Alda Berthold, MD  timolol (TIMOPTIC) 0.5 % ophthalmic solution Place 1 drop into both eyes 2 (two) times daily.  06/10/17  Yes [provider]    Physical Exam:  Vitals:   08/10/19 0700 08/10/19 0830 08/10/19 0835 08/10/19 1031  BP: (!) 130/55 (!) 128/51 (!) 128/51 (!) 141/56  Pulse: 64 60 62 (!) 54  Resp: 18 16 14 18   Temp:   98.2 F (36.8 C) 99.3 F (37.4 C)  TempSrc:   Oral Oral  SpO2: 96% 97% 96% 99%    Constitutional: NAD, calm, comfortable Vitals:   08/10/19 0700 08/10/19 0830 08/10/19 0835 08/10/19 1031  BP: (!) 130/55 (!) 128/51 (!) 128/51 (!) 141/56  Pulse: 64 60 62 (!) 54  Resp: 18 16 14 18   Temp:   98.2 F (36.8 C) 99.3 F (37.4 C)  TempSrc:   Oral Oral  SpO2: 96% 97% 96% 99%   Eyes: Left eye bruise and swell, hard to open, left lids swelling, and conjunctivae normal ENMT: Mucous membranes are moist. Posterior pharynx clear of any exudate or lesions.Normal dentition.  Neck: normal, supple, no masses, no thyromegaly Respiratory: clear to auscultation bilaterally, no wheezing, no crackles. Normal respiratory effort. No accessory muscle use.  Cardiovascular: Regular rate and rhythm, no murmurs / rubs / gallops. No extremity edema. 2+ pedal pulses. No carotid bruits.  Abdomen: no tenderness, no masses palpated. No hepatosplenomegaly. Bowel sounds positive.  Musculoskeletal: no clubbing / cyanosis. No joint deformity upper and lower extremities. Good ROM, no contractures. Normal muscle tone.  Skin: no rashes, lesions, ulcers. No induration Neurologic: CN 2-12 grossly intact. Sensation intact, DTR normal. Strength 5/5 in all 4.  Psychiatric: Normal judgment and insight. Alert and oriented x 3. Normal mood.    Labs on Admission: I have personally reviewed following labs and imaging studies  CBC: Recent Labs  Lab 08/09/19 1849  WBC 11.6*  NEUTROABS 9.6*  HGB 15.2  HCT 43.4  MCV 95.8  PLT [id number]   Basic Metabolic Panel: Recent Labs  Lab 08/09/19 1849  NA 140  K 4.1  CL 103  CO2 27  GLUCOSE 116*  BUN 21  CREATININE 1.20  CALCIUM 9.4   GFR: CrCl cannot be calculated (Unknown ideal weight.). Liver Function Tests:  No results for input(s): AST, ALT, ALKPHOS, BILITOT, PROT, ALBUMIN in the last 168 hours. No results for input(s): LIPASE, AMYLASE in the last 168 hours. No results for input(s): AMMONIA in the last 168 hours. Coagulation Profile: Recent Labs  Lab 08/09/19 1849  INR 1.0   Cardiac Enzymes: No results for input(s): CKTOTAL, CKMB, CKMBINDEX, TROPONINI in the last 168 hours. BNP (last 3 results) No results for input(s): PROBNP in the last 8760 hours. HbA1C: No results for input(s): HGBA1C in the last 72 hours. CBG: No results for input(s): GLUCAP in the last 168 hours. Lipid Profile: No results for input(s): CHOL, HDL, LDLCALC, TRIG, CHOLHDL, LDLDIRECT in the last 72 hours. Thyroid Function Tests: No results for input(s): TSH, T4TOTAL, FREET4, T3FREE, THYROIDAB  in the last 72 hours. Anemia Panel: No results for input(s): VITAMINB12, FOLATE, FERRITIN, TIBC, IRON, RETICCTPCT in the last 72 hours. Urine analysis: No results found for: COLORURINE, APPEARANCEUR, [place], [place], GLUCOSEU, HGBUR, BILIRUBINUR, KETONESUR, PROTEINUR, UROBILINOGEN, NITRITE, LEUKOCYTESUR  Radiological Exams on Admission: CT Head Wo Contrast  Result Date: 08/09/2019 CLINICAL DATA:  Facial trauma. Fall while feet got tangled in garden hose. Nausea. EXAM: CT HEAD WITHOUT CONTRAST TECHNIQUE: Contiguous axial images were obtained from the base of the skull through the vertex without intravenous contrast. COMPARISON:  None. FINDINGS: Brain: Age related atrophy. Moderate to advanced chronic small vessel ischemia. No intracranial hemorrhage, mass effect, or midline shift. No hydrocephalus. The basilar cisterns are patent. No evidence of territorial infarct or acute ischemia. No extra-axial or intracranial fluid collection. Vascular: Atherosclerosis of skullbase vasculature without hyperdense vessel or abnormal calcification. Skull: No calvarial fracture. No focal bone lesion. Air in the cavernous sinuses without adjacent  fluid level or opacification of sphenoid sinuses. Sinuses/Orbits: Left orbital fracture assessed on concurrent face CT, reported separately. Other: Left frontal scalp hematoma. IMPRESSION: 1. Left frontal scalp hematoma. No acute intracranial abnormality. No calvarial fracture. 2. Air in the cavernous sinus may be tracking from left orbital fracture or related to IV placement. Lack of opacification of the adjacent sphenoid sinus argues against skull base fracture, and there is no evidence of pneumocephalus. Orbital fracture is characterized and reported on separate face CT. 3. Age related atrophy and chronic small vessel ischemia. Electronically Signed   By: Keith Rake M.D.   On: 08/09/2019 18:57   CT Maxillofacial Wo Contrast  Result Date: 08/09/2019 CLINICAL DATA:  Facial trauma. Fall while feet got tangled in garden hose. Nausea. EXAM: CT MAXILLOFACIAL WITHOUT CONTRAST TECHNIQUE: Multidetector CT imaging of the maxillofacial structures was performed. Multiplanar CT image reconstructions were also generated. COMPARISON:  None. FINDINGS: Osseous: No fracture of the nasal bone, zygomatic arches, or mandibles. The temporomandibular joints are congruent. Slight rightward nasal septal deviation. Pterygoid plates are intact. Orbits: Comminuted displaced blowout fracture of the left orbit. Displaced and comminuted fracture involves the medial and inferior orbital walls. Medial wall displacement is greater than 6 mm, inferior wall displacement is 3 mm. Large amount of retrobulbar air and soft tissue edema. Inferior rectus muscle is poorly defined. Superior oblique muscle is poorly defined. Other extraocular muscles are intact. Small focus of high density within the posterior chamber of the globe may represent small focus of hemorrhage. No obvious globe deformity. Bilateral cataract resection. No right orbital fracture or globe injury. Sinuses: Left maxillary hemosinus associated with orbital fracture. Fracture  through the medial left ethmoid air cells with opacification. Right maxillary sinus is intact. Frontal sinuses are intact. No opacification of mastoid air cells. Small amount of gas in the cavernous sinus but no fluid level or opacification of sphenoid sinus which argues against skull base fracture. Soft tissues: Left periorbital soft tissue hematoma. Soft tissues otherwise negative. Limited intracranial: Assessed on concurrent head CT, reported separately. IMPRESSION: 1. Comminuted displaced and comminuted blowout fracture of the left orbit involving the medial and inferior orbital walls. Large amount of retrobulbar air and soft tissue edema. Small focus of high density within the posterior chamber of the left globe may represent small focus of hemorrhage. No obvious globe deformity. 2. The left inferior rectus and superior oblique muscles are poorly defined due to adjacent air and edema. 3. There is air in the cavernous sinuses, however no fluid or opacification of adjacent sphenoid sinuses. This air  may be tracking from left orbital fracture or secondary to IV catheter placement. Lack of fluid in the sphenoid sinus argues against skull base fracture. Electronically Signed   By: Keith Rake M.D.   On: 08/09/2019 18:52    EKG: Independently reviewed.   Assessment/Plan Active Problems:   Facial fracture (HCC)  Left orbital fracture Patient reports significant pain needs IV pain meds Recommend ice pack Dilaudid alternated with Toradol Plan to repeat CT head if worsening of mentation, otherwise expect patient discharged home in next 24 hours and follow-up with ophthalmologist and trauma surgeon as outpatient early next week. Wound care  Nausea Had one episode of vomiting this morning.  Physical exam showed no central signs of intracranial pressure increase, if symptoms persist, will consider re-scan head with CT Start liquid diet, maintain him on IV fluid today Neuro checks  Q4H  HLD On  statin  HTN Atenolol  Cataract On eye drops  DVT prophylaxis: Lovenox Code Status: Full Family Communication: Wife and son at bedside Disposition Plan: Expect discharge in 24 hours Consults called: Ophthalmology and facial trauma surgeon Admission status: Telemetry admission   Lequita Halt MD [facility] Pager [phone]    08/10/2019, 1:20 PM

## 2019-08-10 NOTE — ED Notes (Signed)
Report provided to [facility] for transport,  ETA [id number].

## 2019-08-10 NOTE — TOC Initial Note (Signed)
Transition of Care [facility]) - Initial/Assessment Note    Patient Details  Name: Brandon Burke MRN: [id number] Date of Birth: 1931/07/22  Transition of Care [facility]) CM/SW Contact:    Alexander Mt, LCSW Phone Number: 08/10/2019, 3:18 PM  Clinical Narrative:                 CSW spoke with pt at bedside. Pt wife Brandon Burke also at bedside. Pt from [place]. He and his wife have confirmed PCP and apartment #. Pt eager to get home when he can, has a walker and cane at home if needed. TOC team remains available as needed.   Expected Discharge Plan: Home/Self Care Barriers to Discharge: Continued Medical Work up   Patient Goals and CMS Choice Patient states their goals for this hospitalization and ongoing recovery are:: get home tomorrow CMS Medicare.gov Compare Post Acute Care list provided to:: (residents at [facility]) Choice offered to / list presented to : Patient, Spouse  Expected Discharge Plan and Services Expected Discharge Plan: Home/Self Care In-house Referral: Clinical Social Work Discharge Planning Services: CM Consult Post Acute Care Choice: Durable Medical Equipment 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Apartment, [place]  Prior Living Arrangements/Services 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[place], Materials engineer Lives with:: Spouse Patient language and need for interpreter reviewed:: Yes(no needs) Do you feel safe going back to the place where you live?: Yes      Need for Family Participation in Patient Care: Yes (Comment)(assistance w/ daily cares as needed) Care giver support system in place?: Yes (comment)(spouse) Current home services: DME Criminal Activity/Legal Involvement Pertinent to Current Situation/Hospitalization: No - Comment as needed   Permission Sought/Granted Permission sought to share information with : Family Supports Permission granted to share information with : Yes, Verbal  Permission Granted  Share Information with NAME: Shawnmichael Anders  Permission granted to share info w AGENCY: [place] granted to share info w Relationship: wife  Permission granted to share info w Contact Information: [phone]  Emotional Assessment Appearance:: Appears stated age Attitude/Demeanor/Rapport: Engaged Affect (typically observed): Accepting, Adaptable, Appropriate Orientation: : Oriented to Self, Oriented to Place, Oriented to  Time, Oriented to Situation Alcohol / Substance Use: Not Applicable Psych Involvement: (n/a)  Admission diagnosis:  Facial fracture ([place]) [S02.92XA] Closed head injury, initial encounter [S09.90XA] Fall, initial encounter [W19.XXXA] Complex laceration of face, initial encounter [S01.91XA] Subconjunctival hemorrhage of left eye [H11.32] Closed blow out fracture of orbit, initial encounter ([place]) [S02.30XA] Traumatic hyphema of left eye, initial encounter [id number] Patient Active Problem List   Diagnosis Date Noted  . Fall   . Complex laceration of face   . Closed blow out fracture of orbit ([place])   . Facial fracture ([place]) 08/09/2019  . PCP NOTES >>>>>>>>>>>>>>> 10/09/2015  . Allergic rhinitis 02/26/2014  . Headache(784.0) 03/09/2012  . Annual physical exam 06/22/2011  . Osteoarthritis 12/15/2009  . Insomnia 06/02/2009  . History of pulmonary embolism 09/22/2007  . DVT, HX OF 09/13/2007  . Dyslipidemia 10/27/2006  . PROSTATE CANCER, HX OF 10/27/2006  . MVP (mitral valve prolapse) 10/05/2006   PCP:  Colon Branch, MD Pharmacy:   [facility] [address] [place] [address] [place] [address] Phone: [phone] Fax: [phone]   Readmission Risk Interventions Readmission Risk Prevention Plan 08/10/2019  Post Dischage Appt Not Complete  Appt Comments pending medical stability  Medication Screening Complete  Transportation Screening Complete  Some recent data  might be hidden

## 2019-08-10 NOTE — Consult Note (Signed)
Discussed with plastic surgery, she will follow up with patient today and order appropriate topical care.   Will not consult for that reason.    Re consult if needed, will not follow at this time. Thanks  Augusto Deckman [employer], RN,CWOCN, CNS, [place] [phone])

## 2019-08-10 NOTE — ED Notes (Signed)
Pt awake, alert, at baseline cognition.  Bruising around left orbit.  Steri strips intact to left eyebrow.  Bleeding controlled at this time.  Awaiting bed placement.  Wife at bedside.

## 2019-08-10 NOTE — Social Work (Addendum)
Pt from [facility] or [facility], message left for Elizebeth Brooking at [place], MSW, Bee Work

## 2019-08-10 NOTE — ED Notes (Deleted)
Pt to u/s

## 2019-08-10 NOTE — Consult Note (Signed)
Reason for Consult:  Facial Trauma Referring Physician: Dr. Veryl Speak  Brandon Burke is an 86 y.o. male.  HPI: The patient is an 86 yrs old wf here from admission through the [facility] ED for an injury directly prior to presentation.  It is reported he was in the garden when he tripped and was struck by a large branch. LOC is not known.  He has a laceration of his left upper eyelid and extreme left eye swelling.  He has a history of PE, prostate cancer, arthritis and hyperlipidemia. He is not a smoker.  While in the ED he complained of head pain and had an episode of vomiting.  He has been doing better today.  His left eye and periorbital area is very swollen.  The steri strips are in place.    Past Medical History:  Diagnosis Date  . DVT (deep venous thrombosis) ([place])    hx of 2001 (after prostate surgery)  . Hyperlipidemia   . Mitral valve prolapse   . PE (pulmonary embolism)    5-09:was referred to hematology, and they recommend to discontinue Coumadin 5/11  . Prostate cancer [facility])     released from urology in 2010,needs yearly PSAs with PCP    Past Surgical History:  Procedure Laterality Date  . PROSTATECTOMY  2000    Family History  Problem Relation Age of Onset  . CAD Father 65  . Diabetes Neg Hx   . Colon cancer Neg Hx   . Prostate cancer Neg Hx     Social History:  reports that he has never smoked. He has never used smokeless tobacco. He reports that he does not drink alcohol or use drugs.  Allergies:  Allergies  Allergen Reactions  . Nitrofuran Derivatives     headache  . Sulfonamide Derivatives Nausea And Vomiting    Medications: I have reviewed the patient's current medications.  Results for orders placed or performed during the hospital encounter of 08/09/19 (from the past 48 hour(s))  Basic metabolic panel     Status: Abnormal   Collection Time: 08/09/19  6:49 PM  Result Value Ref Range   Sodium 140 135 - 145 mmol/L   Potassium 4.1 3.5 - 5.1  mmol/L   Chloride 103 98 - 111 mmol/L   CO2 27 22 - 32 mmol/L   Glucose, Bld 116 (H) 70 - 99 mg/dL    Comment: Glucose reference range applies only to samples taken after fasting for at least 8 hours.   BUN 21 8 - 23 mg/dL   Creatinine, Ser 1.20 0.61 - 1.24 mg/dL   Calcium 9.4 8.9 - 10.3 mg/dL   GFR calc non Af Amer 55 (L) >60 mL/min   GFR calc Af Amer >60 >60 mL/min   Anion gap 10 5 - 15    Comment: Performed at [facility] [address] [place], [place] [address]  CBC with Differential     Status: Abnormal   Collection Time: 08/09/19  6:49 PM  Result Value Ref Range   WBC 11.6 (H) 4.0 - 10.5 K/uL   RBC 4.53 4.22 - 5.81 MIL/uL   Hemoglobin 15.2 13.0 - 17.0 g/dL   HCT 43.4 39.0 - 52.0 %   MCV 95.8 80.0 - 100.0 fL   MCH 33.6 26.0 - 34.0 pg   MCHC 35.0 30.0 - 36.0 g/dL   RDW 12.1 11.5 - 15.5 %   Platelets 161 150 - 400 K/uL   nRBC 0.0  0.0 - 0.2 %   Neutrophils Relative % 84 %   Neutro Abs 9.6 (H) 1.7 - 7.7 K/uL   Lymphocytes Relative 8 %   Lymphs Abs 1.0 0.7 - 4.0 K/uL   Monocytes Relative 7 %   Monocytes Absolute 0.8 0.1 - 1.0 K/uL   Eosinophils Relative 1 %   Eosinophils Absolute 0.1 0.0 - 0.5 K/uL   Basophils Relative 0 %   Basophils Absolute 0.0 0.0 - 0.1 K/uL   Immature Granulocytes 0 %   Abs Immature Granulocytes 0.04 0.00 - 0.07 K/uL    Comment: Performed at [facility], [address] [place], [place] [address]  Protime-INR     Status: None   Collection Time: 08/09/19  6:49 PM  Result Value Ref Range   Prothrombin Time 13.2 11.4 - 15.2 seconds   INR 1.0 0.8 - 1.2    Comment: (NOTE) INR goal varies based on device and disease states. Performed at [facility], [address] [place], [place] [address]   SARS CORONAVIRUS 2 (TAT 6-24 HRS) Nasopharyngeal Oropharyngeal swab     Status: None   Collection Time: 08/09/19  9:18 PM   Specimen: Oropharyngeal swab; Nasopharyngeal  Result Value Ref Range   SARS Coronavirus 2  NEGATIVE NEGATIVE    Comment: (NOTE) SARS-CoV-2 target nucleic acids are NOT DETECTED. The SARS-CoV-2 RNA is generally detectable in upper and lower respiratory specimens during the acute phase of infection. Negative results do not preclude SARS-CoV-2 infection, do not rule out co-infections with other pathogens, and should not be used as the sole basis for treatment or other patient management decisions. Negative results must be combined with clinical observations, patient history, and epidemiological information. The expected result is Negative. Fact Sheet for Patients: SugarRoll.be Fact Sheet for Healthcare Providers: https://www.woods-mathews.com/ This test is not yet approved or cleared by the [place] FDA and  has been authorized for detection and/or diagnosis of SARS-CoV-2 by FDA under an Emergency Use Authorization (EUA). This EUA will remain  in effect (meaning this test can be used) for the duration of the COVID-19 declaration under Section 56 4(b)(1) of the Act, 21 U.S.C. section 360bbb-3(b)(1), unless the authorization is terminated or revoked sooner. Performed at [facility], [place] [address] [place],  [address]     CT Head Wo Contrast  Result Date: 08/09/2019 CLINICAL DATA:  Facial trauma. Fall while feet got tangled in garden hose. Nausea. EXAM: CT HEAD WITHOUT CONTRAST TECHNIQUE: Contiguous axial images were obtained from the base of the skull through the vertex without intravenous contrast. COMPARISON:  None. FINDINGS: Brain: Age related atrophy. Moderate to advanced chronic small vessel ischemia. No intracranial hemorrhage, mass effect, or midline shift. No hydrocephalus. The basilar cisterns are patent. No evidence of territorial infarct or acute ischemia. No extra-axial or intracranial fluid collection. Vascular: Atherosclerosis of skullbase vasculature without hyperdense vessel or abnormal calcification. Skull:  No calvarial fracture. No focal bone lesion. Air in the cavernous sinuses without adjacent fluid level or opacification of sphenoid sinuses. Sinuses/Orbits: Left orbital fracture assessed on concurrent face CT, reported separately. Other: Left frontal scalp hematoma. IMPRESSION: 1. Left frontal scalp hematoma. No acute intracranial abnormality. No calvarial fracture. 2. Air in the cavernous sinus may be tracking from left orbital fracture or related to IV placement. Lack of opacification of the adjacent sphenoid sinus argues against skull base fracture, and there is no evidence of pneumocephalus. Orbital fracture is characterized and reported on separate  face CT. 3. Age related atrophy and chronic small vessel ischemia. Electronically Signed   By: Keith Rake M.D.   On: 08/09/2019 18:57   CT Maxillofacial Wo Contrast  Result Date: 08/09/2019 CLINICAL DATA:  Facial trauma. Fall while feet got tangled in garden hose. Nausea. EXAM: CT MAXILLOFACIAL WITHOUT CONTRAST TECHNIQUE: Multidetector CT imaging of the maxillofacial structures was performed. Multiplanar CT image reconstructions were also generated. COMPARISON:  None. FINDINGS: Osseous: No fracture of the nasal bone, zygomatic arches, or mandibles. The temporomandibular joints are congruent. Slight rightward nasal septal deviation. Pterygoid plates are intact. Orbits: Comminuted displaced blowout fracture of the left orbit. Displaced and comminuted fracture involves the medial and inferior orbital walls. Medial wall displacement is greater than 6 mm, inferior wall displacement is 3 mm. Large amount of retrobulbar air and soft tissue edema. Inferior rectus muscle is poorly defined. Superior oblique muscle is poorly defined. Other extraocular muscles are intact. Small focus of high density within the posterior chamber of the globe may represent small focus of hemorrhage. No obvious globe deformity. Bilateral cataract resection. No right orbital fracture or  globe injury. Sinuses: Left maxillary hemosinus associated with orbital fracture. Fracture through the medial left ethmoid air cells with opacification. Right maxillary sinus is intact. Frontal sinuses are intact. No opacification of mastoid air cells. Small amount of gas in the cavernous sinus but no fluid level or opacification of sphenoid sinus which argues against skull base fracture. Soft tissues: Left periorbital soft tissue hematoma. Soft tissues otherwise negative. Limited intracranial: Assessed on concurrent head CT, reported separately. IMPRESSION: 1. Comminuted displaced and comminuted blowout fracture of the left orbit involving the medial and inferior orbital walls. Large amount of retrobulbar air and soft tissue edema. Small focus of high density within the posterior chamber of the left globe may represent small focus of hemorrhage. No obvious globe deformity. 2. The left inferior rectus and superior oblique muscles are poorly defined due to adjacent air and edema. 3. There is air in the cavernous sinuses, however no fluid or opacification of adjacent sphenoid sinuses. This air may be tracking from left orbital fracture or secondary to IV catheter placement. Lack of fluid in the sphenoid sinus argues against skull base fracture. Electronically Signed   By: Keith Rake M.D.   On: 08/09/2019 18:52    Review of Systems  Constitutional: Negative.   HENT: Negative.   Eyes: Negative.   Respiratory: Negative.   Cardiovascular: Negative.   Gastrointestinal: Negative.   Endocrine: Negative.   Genitourinary: Negative.   Musculoskeletal: Negative.   Skin: Negative.    Blood pressure (!) 125/58, pulse 67, temperature 98.5 F (36.9 C), temperature source Oral, resp. rate 10, SpO2 91 %. Physical Exam  Constitutional: He appears well-developed and well-nourished.  HENT:  Head: Normocephalic.  Cardiovascular: Normal rate.  Respiratory: Effort normal. No respiratory distress.   Musculoskeletal:        General: Tenderness and edema present.  Neurological: He is alert.  Skin: Skin is warm. There is erythema.  Psychiatric: He has a normal mood and affect. His behavior is normal.    Assessment/Plan: Multiple facial fractures Keep head of bed elevated as able.  Soft diet can start tomorrow if he tolerates liquids well.  Protein shakes three times a day.  No nose blowing.  Surgery will depend on status in next 1-2 weeks.  Patient would like to treat the fractures conservatively which is very reasonable.  Any surgery would need to wait until the eye swelling  improved.  Does not need to stay in the hospital from my standpoint.  Follow up in 1-2 weeks in the office.    Loel Lofty  08/10/2019, 7:26 AM

## 2019-08-10 NOTE — Progress Notes (Signed)
Patient arrived to the floor to 6N05 via carelink, AOx4, VSS, denies pain, ambulatory with assistance, oriented to room, call bell and use of bed controls, informed about visitation policy, will continue to monitor and awaiting orders.

## 2019-08-10 NOTE — ED Provider Notes (Signed)
Pt has a bed assignment at [facility] so he can see Dr. Marla Roe who is on for facial trauma and ophthalmology.  He did have some n/v in the night and his left eye is still hurting.  He is give a dose of dilaudid 0.5 mg IV.  Pt is stable for transfer.   Isla Pence, MD 08/10/19 [phone]

## 2019-08-11 DIAGNOSIS — S0230XA Fracture of orbital floor, unspecified side, initial encounter for closed fracture: Secondary | ICD-10-CM

## 2019-08-11 LAB — CBC
HCT: 38.6 % — ABNORMAL LOW (ref 39.0–52.0)
Hemoglobin: 13.3 g/dL (ref 13.0–17.0)
MCH: 33.4 pg (ref 26.0–34.0)
MCHC: 34.5 g/dL (ref 30.0–36.0)
MCV: 97 fL (ref 80.0–100.0)
Platelets: 146 10*3/uL — ABNORMAL LOW (ref 150–400)
RBC: 3.98 MIL/uL — ABNORMAL LOW (ref 4.22–5.81)
RDW: 12.2 % (ref 11.5–15.5)
WBC: 8.2 10*3/uL (ref 4.0–10.5)
nRBC: 0 % (ref 0.0–0.2)

## 2019-08-11 MED ORDER — OXYCODONE HCL 5 MG PO TABS: 5.0000 mg | ORAL_TABLET | Freq: Four times a day (QID) | ORAL | 0 refills | Status: DC | PRN

## 2019-08-11 MED ORDER — PRAVASTATIN SODIUM 40 MG PO TABS: 40.0000 mg | ORAL_TABLET | Freq: Every day | ORAL | Status: DC

## 2019-08-11 MED ORDER — ONDANSETRON HCL 4 MG PO TABS: 4.0000 mg | ORAL_TABLET | Freq: Three times a day (TID) | ORAL | 0 refills | Status: DC | PRN

## 2019-08-11 NOTE — Discharge Summary (Signed)
Physician Discharge Summary  Brandon Burke [facility] [id number] DOB: Oct 06, 1931 DOA: 08/09/2019  PCP: Colon Branch, MD  Admit date: 08/09/2019 Discharge date: 08/11/2019  Admitted From: Home Disposition: Home  Recommendations for Outpatient Follow-up:  1. Follow up with PCP in 1 week with repeat CBC/BMP 2. Outpatient follow-up with trauma surgery/plastic surgery and ophthalmology 3. Follow up in ED if symptoms worsen or new appear   Home Health: No Equipment/Devices: None  Discharge Condition: Stable CODE STATUS: Full Diet recommendation: Heart healthy  Brief/Interim Summary: 86 y.o. male with medical history significant of remote Hx of DVT and pulmonary embolism (2011), hyperlipidemia, Cataract, and remote Hx of prostate cancer presented to [facility] ER for for evaluation of fall. He was in his yard pulling the garden hose when he tripped over it, fell, and struck his right forehead on a large branch.  In the ED,  CT showed blowout fractures of the inferior and medial walls of the orbit as well as what appears to be a small focus of hemorrhage in the posterior chamber of the left globe.ER physician discussed with ophthalmologist Dr. Posey Pronto who advised follow-up as outpatient. Dr. Trish Fountain, plastic/trauma surgeon advised follow-up but since patient became more drowsy and started vomiting,, he was admitted to the hospital.  During hospitalization, his symptoms have improved.  He is not currently vomiting and wants to go home.  He will be discharged home with outpatient follow-up with trauma surgery/plastic surgery and ophthalmology.  Discharge Diagnoses:   Left orbital fracture -Currently still has some left eye swelling.  Patient needs to follow-up with ophthalmology as well as trauma/plastic surgery Dr. Marla Roe -Continue oral pain medications on discharge.  Hold aspirin on discharge  Nausea/vomiting -Patient was observed for the same and subsequently no more vomiting since  hospitalization.  Advance diet.  If tolerates diet, discharge patient home today.  Hyperlipidemia -Continue statin  Hypertension -Continue atenolol  Cataracts-continue eyedrops   Discharge Instructions  Discharge Instructions    Diet - low sodium heart healthy   Complete by: As directed    Increase activity slowly   Complete by: As directed      Allergies as of 08/11/2019      Reactions   Nitrofuran Derivatives    headache   Sulfonamide Derivatives Nausea And Vomiting      Medication List    STOP taking these medications   aspirin 81 MG tablet     TAKE these medications   ARTIFICIAL TEARS OP Apply to eye daily as needed.   atenolol 50 MG tablet Commonly known as: TENORMIN Take 1 tablet (50 mg total) by mouth daily.   azelastine 0.1 % nasal spray Commonly known as: ASTELIN Place 2 sprays into both nostrils at bedtime as needed for rhinitis or allergies.   diphenhydramine-acetaminophen 25-500 MG Tabs tablet Commonly known as: TYLENOL PM Take 1 tablet by mouth at bedtime as needed.   fluticasone 50 MCG/ACT nasal spray Commonly known as: FLONASE Place 2 sprays into both nostrils daily as needed for allergies or rhinitis.   folic acid [id number] MCG tablet Commonly known as: FOLVITE Take 400 mcg by mouth at bedtime.   latanoprost 0.005 % ophthalmic solution Commonly known as: XALATAN Place 1 drop into both eyes at bedtime. Reported on 10/08/2015   multivitamin,tx-minerals tablet Take 1 tablet by mouth daily.   ondansetron 4 MG tablet Commonly known as: Zofran Take 1 tablet (4 mg total) by mouth every 8 (eight) hours as needed for nausea or vomiting.   oxyCODONE  5 MG immediate release tablet Commonly known as: Roxicodone Take 1 tablet (5 mg total) by mouth every 6 (six) hours as needed for severe pain.   polyethylene glycol 17 g packet Commonly known as: MIRALAX / GLYCOLAX Take 17 g by mouth daily.   pravastatin 40 MG tablet Commonly known as:  PRAVACHOL Take 1 tablet (40 mg total) by mouth at bedtime.   timolol 0.5 % ophthalmic solution Commonly known as: TIMOPTIC Place 1 drop into both eyes 2 (two) times daily.      Follow-up Information    Colon Branch, MD. Schedule an appointment as soon as possible for a visit in 1 week(s).   Specialty: Internal Medicine Contact information: [address] [place] [address] [phone]        Wallace Going, DO. Schedule an appointment as soon as possible for a visit in 1 week(s).   Specialty: Plastic Surgery Contact information: [address] [address] [phone]        Jalene Mullet, MD. Schedule an appointment as soon as possible for a visit in 1 week(s).   Specialty: Ophthalmology Contact information: [address] [place] [address] [phone]          Allergies  Allergen Reactions  . Nitrofuran Derivatives     headache  . Sulfonamide Derivatives Nausea And Vomiting    Consultations:  Dr. Dillingham/plastic, trauma surgeon  Phone consultation by ED provider with Dr. Patel/ophthalmology   Procedures/Studies: CT Head Wo Contrast  Result Date: 08/09/2019 CLINICAL DATA:  Facial trauma. Fall while feet got tangled in garden hose. Nausea. EXAM: CT HEAD WITHOUT CONTRAST TECHNIQUE: Contiguous axial images were obtained from the base of the skull through the vertex without intravenous contrast. COMPARISON:  None. FINDINGS: Brain: Age related atrophy. Moderate to advanced chronic small vessel ischemia. No intracranial hemorrhage, mass effect, or midline shift. No hydrocephalus. The basilar cisterns are patent. No evidence of territorial infarct or acute ischemia. No extra-axial or intracranial fluid collection. Vascular: Atherosclerosis of skullbase vasculature without hyperdense vessel or abnormal calcification. Skull: No calvarial fracture. No focal bone lesion. Air in the cavernous sinuses without  adjacent fluid level or opacification of sphenoid sinuses. Sinuses/Orbits: Left orbital fracture assessed on concurrent face CT, reported separately. Other: Left frontal scalp hematoma. IMPRESSION: 1. Left frontal scalp hematoma. No acute intracranial abnormality. No calvarial fracture. 2. Air in the cavernous sinus may be tracking from left orbital fracture or related to IV placement. Lack of opacification of the adjacent sphenoid sinus argues against skull base fracture, and there is no evidence of pneumocephalus. Orbital fracture is characterized and reported on separate face CT. 3. Age related atrophy and chronic small vessel ischemia. Electronically Signed   By: Keith Rake M.D.   On: 08/09/2019 18:57   CT Maxillofacial Wo Contrast  Result Date: 08/09/2019 CLINICAL DATA:  Facial trauma. Fall while feet got tangled in garden hose. Nausea. EXAM: CT MAXILLOFACIAL WITHOUT CONTRAST TECHNIQUE: Multidetector CT imaging of the maxillofacial structures was performed. Multiplanar CT image reconstructions were also generated. COMPARISON:  None. FINDINGS: Osseous: No fracture of the nasal bone, zygomatic arches, or mandibles. The temporomandibular joints are congruent. Slight rightward nasal septal deviation. Pterygoid plates are intact. Orbits: Comminuted displaced blowout fracture of the left orbit. Displaced and comminuted fracture involves the medial and inferior orbital walls. Medial wall displacement is greater than 6 mm, inferior wall displacement is 3 mm. Large amount of retrobulbar air and soft  tissue edema. Inferior rectus muscle is poorly defined. Superior oblique muscle is poorly defined. Other extraocular muscles are intact. Small focus of high density within the posterior chamber of the globe may represent small focus of hemorrhage. No obvious globe deformity. Bilateral cataract resection. No right orbital fracture or globe injury. Sinuses: Left maxillary hemosinus associated with orbital fracture.  Fracture through the medial left ethmoid air cells with opacification. Right maxillary sinus is intact. Frontal sinuses are intact. No opacification of mastoid air cells. Small amount of gas in the cavernous sinus but no fluid level or opacification of sphenoid sinus which argues against skull base fracture. Soft tissues: Left periorbital soft tissue hematoma. Soft tissues otherwise negative. Limited intracranial: Assessed on concurrent head CT, reported separately. IMPRESSION: 1. Comminuted displaced and comminuted blowout fracture of the left orbit involving the medial and inferior orbital walls. Large amount of retrobulbar air and soft tissue edema. Small focus of high density within the posterior chamber of the left globe may represent small focus of hemorrhage. No obvious globe deformity. 2. The left inferior rectus and superior oblique muscles are poorly defined due to adjacent air and edema. 3. There is air in the cavernous sinuses, however no fluid or opacification of adjacent sphenoid sinuses. This air may be tracking from left orbital fracture or secondary to IV catheter placement. Lack of fluid in the sphenoid sinus argues against skull base fracture. Electronically Signed   By: Keith Rake M.D.   On: 08/09/2019 18:52       Subjective: Patient seen and examined at bedside.  He states that his left eye area is hurting intermittently but he feels okay to go home today.  Currently not nauseous or vomiting.  No overnight fevers.  Discharge Exam: Vitals:   08/10/19 2113 08/11/19 0510  BP: (!) 129/56 (!) 104/54  Pulse: 64 (!) 59  Resp: 20 14  Temp: 98.9 F (37.2 C) 99.3 F (37.4 C)  SpO2: 100% 93%    General: Pt is alert, awake, not in acute distress.  Left periorbital swelling and bruising present.  Left eye is not open due to left lid swelling Cardiovascular: Mildly bradycardic, S1/S2 + Respiratory: bilateral decreased breath sounds at bases Abdominal: Soft, NT, ND, bowel sounds  + Extremities: no edema, no cyanosis    The results of significant diagnostics from this hospitalization (including imaging, microbiology, ancillary and laboratory) are listed below for reference.     Microbiology: Recent Results (from the past 240 hour(s))  SARS CORONAVIRUS 2 (TAT 6-24 HRS) Nasopharyngeal Oropharyngeal swab     Status: None   Collection Time: 08/09/19  9:18 PM   Specimen: Oropharyngeal swab; Nasopharyngeal  Result Value Ref Range Status   SARS Coronavirus 2 NEGATIVE NEGATIVE Final    Comment: (NOTE) SARS-CoV-2 target nucleic acids are NOT DETECTED. The SARS-CoV-2 RNA is generally detectable in upper and lower respiratory specimens during the acute phase of infection. Negative results do not preclude SARS-CoV-2 infection, do not rule out co-infections with other pathogens, and should not be used as the sole basis for treatment or other patient management decisions. Negative results must be combined with clinical observations, patient history, and epidemiological information. The expected result is Negative. Fact Sheet for Patients: SugarRoll.be Fact Sheet for Healthcare Providers: https://www.woods-mathews.com/ This test is not yet approved or cleared by the [place] FDA and  has been authorized for detection and/or diagnosis of SARS-CoV-2 by FDA under an Emergency Use Authorization (EUA). This EUA will remain  in effect (meaning this  test can be used) for the duration of the COVID-19 declaration under Section 56 4(b)(1) of the Act, 21 U.S.C. section 360bbb-3(b)(1), unless the authorization is terminated or revoked sooner. Performed at [facility], [place] [address] [place], [place] [address]      Labs: BNP (last 3 results) No results for input(s): BNP in the last 8760 hours. Basic Metabolic Panel: Recent Labs  Lab 08/09/19 1849  NA 140  K 4.1  CL 103  CO2 27  GLUCOSE 116*  BUN 21  CREATININE 1.20   CALCIUM 9.4   Liver Function Tests: No results for input(s): AST, ALT, ALKPHOS, BILITOT, PROT, ALBUMIN in the last 168 hours. No results for input(s): LIPASE, AMYLASE in the last 168 hours. No results for input(s): AMMONIA in the last 168 hours. CBC: Recent Labs  Lab 08/09/19 1849 08/11/19 0331  WBC 11.6* 8.2  NEUTROABS 9.6*  --   HGB 15.2 13.3  HCT 43.4 38.6*  MCV 95.8 97.0  PLT 161 146*   Cardiac Enzymes: No results for input(s): CKTOTAL, CKMB, CKMBINDEX, TROPONINI in the last 168 hours. BNP: Invalid input(s): POCBNP CBG: No results for input(s): GLUCAP in the last 168 hours. D-Dimer No results for input(s): DDIMER in the last 72 hours. Hgb A1c No results for input(s): HGBA1C in the last 72 hours. Lipid Profile No results for input(s): CHOL, HDL, LDLCALC, TRIG, CHOLHDL, LDLDIRECT in the last 72 hours. Thyroid function studies No results for input(s): TSH, T4TOTAL, T3FREE, THYROIDAB in the last 72 hours.  Invalid input(s): FREET3 Anemia work up No results for input(s): VITAMINB12, FOLATE, FERRITIN, TIBC, IRON, RETICCTPCT in the last 72 hours. Urinalysis No results found for: COLORURINE, APPEARANCEUR, [place], [place], [place], [place], [place], [place], PROTEINUR, UROBILINOGEN, NITRITE, LEUKOCYTESUR Sepsis Labs Invalid input(s): PROCALCITONIN,  WBC,  LACTICIDVEN Microbiology Recent Results (from the past 240 hour(s))  SARS CORONAVIRUS 2 (TAT 6-24 HRS) Nasopharyngeal Oropharyngeal swab     Status: None   Collection Time: 08/09/19  9:18 PM   Specimen: Oropharyngeal swab; Nasopharyngeal  Result Value Ref Range Status   SARS Coronavirus 2 NEGATIVE NEGATIVE Final    Comment: (NOTE) SARS-CoV-2 target nucleic acids are NOT DETECTED. The SARS-CoV-2 RNA is generally detectable in upper and lower respiratory specimens during the acute phase of infection. Negative results do not preclude SARS-CoV-2 infection, do not rule out co-infections with other pathogens, and  should not be used as the sole basis for treatment or other patient management decisions. Negative results must be combined with clinical observations, patient history, and epidemiological information. The expected result is Negative. Fact Sheet for Patients: SugarRoll.be Fact Sheet for Healthcare Providers: https://www.woods-mathews.com/ This test is not yet approved or cleared by the [place] FDA and  has been authorized for detection and/or diagnosis of SARS-CoV-2 by FDA under an Emergency Use Authorization (EUA). This EUA will remain  in effect (meaning this test can be used) for the duration of the COVID-19 declaration under Section 56 4(b)(1) of the Act, 21 U.S.C. section 360bbb-3(b)(1), unless the authorization is terminated or revoked sooner. Performed at [facility], [place] [address] [place], [place] [address]      Time coordinating discharge: 35 minutes  SIGNED:   Aline August, MD  [facility] 08/11/2019, 10:20 AM

## 2019-08-11 NOTE — Evaluation (Signed)
Physical Therapy Evaluation Patient Details Name: Brandon Burke MRN: [id number] DOB: Jan 21, 1932 Today's Date: 08/11/2019   History of Present Illness  86 y.o. male with medical history significant of remote Hx of DVT and pulmonary embolism (2011), hyperlipidemia, Cataract, and remote Hx of prostate cancer presented to [facility] ER for for evaluation of fall. CT showed blowout fractures of the inferior and medial walls of the orbit as well as what appears to be a small focus of hemorrhage in the posterior chamber of the left globe. Pt became drowsy and started vomiting and was admitted to hospital.   Clinical Impression  Prior to admission, pt lives with his wife at an [place] and is independent. Pt presents with L eye visual deficits and mild static/dynamic balance deficits. Ambulating hallway distances with no assistive device without physical assist. Able to locate signage on left side of hallway with one error. Education provided on head turns for adaptive technique and concussion management. Pt and pt wife verbalized understanding. No further PT needs.     Follow Up Recommendations No PT follow up    Equipment Recommendations  None recommended by PT    Recommendations for Other Services       Precautions / Restrictions Precautions Precautions: None Restrictions Weight Bearing Restrictions: No      Mobility  Bed Mobility               General bed mobility comments: OOB in chair  Transfers Overall transfer level: Modified independent Equipment used: None                Ambulation/Gait Ambulation/Gait assistance: Modified independent (Device/Increase time) Gait Distance (Feet): 250 Feet Assistive device: None Gait Pattern/deviations: WFL(Within Functional Limits)     General Gait Details: Steady pace, no gross unsteadiness. Able to perform head turns and step over obstacles with increased time  Stairs            Wheelchair Mobility    Modified  Rankin (Stroke Patients Only)       Balance Overall balance assessment: Mild deficits observed, not formally tested                                           Pertinent Vitals/Pain Pain Assessment: No/denies pain    Home Living Family/patient expects to be discharged to:: Private residence Living Arrangements: Spouse/significant other Available Help at Discharge: Family Type of Home: Independent living facility Home Access: Level entry     Home Layout: One level Home Equipment: Environmental consultant - 2 wheels;Cane - single point      Prior Function Level of Independence: Independent               Hand Dominance        Extremity/Trunk Assessment   Upper Extremity Assessment Upper Extremity Assessment: Overall WFL for tasks assessed    Lower Extremity Assessment Lower Extremity Assessment: Overall WFL for tasks assessed    Cervical / Trunk Assessment Cervical / Trunk Assessment: Normal  Communication   Communication: No difficulties  Cognition Arousal/Alertness: Awake/alert Behavior During Therapy: WFL for tasks assessed/performed Overall Cognitive Status: Within Functional Limits for tasks assessed                                        General Comments  Exercises     Assessment/Plan    PT Assessment Patent does not need any further PT services  PT Problem List         PT Treatment Interventions      PT Goals (Current goals can be found in the Care Plan section)  Acute Rehab PT Goals Patient Stated Goal: go home PT Goal Formulation: With patient/family Time For Goal Achievement: 08/25/19 Potential to Achieve Goals: Good    Frequency     Barriers to discharge        Co-evaluation               AM-PAC PT "6 Clicks" Mobility  Outcome Measure Help needed turning from your back to your side while in a flat bed without using bedrails?: None Help needed moving from lying on your back to sitting on the side  of a flat bed without using bedrails?: None Help needed moving to and from a bed to a chair (including a wheelchair)?: None Help needed standing up from a chair using your arms (e.g., wheelchair or bedside chair)?: None Help needed to walk in hospital room?: None Help needed climbing 3-5 steps with a railing? : None 6 Click Score: 24    End of Session   Activity Tolerance: Patient tolerated treatment well Patient left: in chair;with call bell/phone within reach;with family/visitor present Nurse Communication: Mobility status PT Visit Diagnosis: Unsteadiness on feet (R26.81)    Time: [id number] PT Time Calculation (min) (ACUTE ONLY): 12 min   Charges:   PT Evaluation $PT Eval Low Complexity: [place], PT, DPT Acute Rehabilitation Services Pager [phone] Office [phone]   Deno Etienne 08/11/2019, 5:03 PM

## 2019-08-11 NOTE — Care Management CC44 (Signed)
Condition Code 44 Documentation Completed  Patient Details  Name: Brandon Burke MRN: [id number] Date of Birth: July 11, 1931   Condition Code 44 given:  Yes Patient signature on Condition Code 44 notice:  Yes Documentation of 2 MD's agreement:  Yes Code 44 added to claim:  Yes    Claudie Leach, RN 08/11/2019, 10:36 AM

## 2019-08-11 NOTE — Progress Notes (Signed)
Patient discharged to home. Verbalizes understanding of all discharge instructions including incision care, discharge medications, and follow up MD visits. Patient's wife present for d/c instructions.

## 2019-08-11 NOTE — Care Management Obs Status (Signed)
[facility] NOTIFICATION   Patient Details  Name: Brandon Burke MRN: [id number] Date of Birth: January 15, 1932   Medicare Observation Status Notification Given:  Yes    Claudie Leach, RN 08/11/2019, 10:36 AM

## 2019-08-13 ENCOUNTER — Other Ambulatory Visit: Payer: Self-pay

## 2019-08-13 ENCOUNTER — Telehealth: Payer: Self-pay | Admitting: *Deleted

## 2019-08-13 NOTE — Telephone Encounter (Signed)
1st attempt. Unable to reach patient.   

## 2019-08-14 ENCOUNTER — Encounter: Payer: Self-pay | Admitting: Plastic Surgery

## 2019-08-14 ENCOUNTER — Ambulatory Visit: Payer: PPO | Admitting: Plastic Surgery

## 2019-08-14 VITALS — BP 169/79 | HR 66 | Temp 97.1°F | Ht 72.0 in | Wt 157.2 lb

## 2019-08-14 DIAGNOSIS — S0191XA Laceration without foreign body of unspecified part of head, initial encounter: Secondary | ICD-10-CM

## 2019-08-14 NOTE — Telephone Encounter (Signed)
I have made two attempts and have been unable to reach patient. Pt has hospital follow up scheduled w/ PCP 08/15/19@1 .

## 2019-08-14 NOTE — Progress Notes (Signed)
   Subjective:    Patient ID: Brandon Burke, male    DOB: 1931/09/16, 86 y.o.   MRN: [id number]  The patient is an 86 year old male here with his wife for follow-up on his facial fractures that were sustained last Thursday.  The patient was outside working when he sustained the injury he went to [facility] ER and vomiting in the emergency room.  There was concern and therefore he was transferred and admitted to [facility].  I saw him while he was admitted and he had extreme swelling of the left periorbital area.  The CT scan showed a comminuted displaced blowout fracture involving the medial and inferior orbital walls.  He wanted to do conservative treatment and avoid surgery if possible.  This is reasonable.  I recommended no nose blowing for several weeks.  He is also scheduled to see the eye doctor this week.  The Steri-Strips are coming loose on the left upper lid.  But he is showing good signs of.  The swelling has decreased significantly.   Review of Systems  Constitutional: Negative.   HENT: Negative.   Eyes: Positive for photophobia, pain and redness.  Respiratory: Negative.   Cardiovascular: Negative.   Endocrine: Negative.   Genitourinary: Negative.        Objective:   Physical Exam Vitals and nursing note reviewed.  HENT:     Head: Normocephalic.  Cardiovascular:     Rate and Rhythm: Normal rate.     Pulses: Normal pulses.  Pulmonary:     Effort: Pulmonary effort is normal.  Neurological:     Mental Status: He is alert. Mental status is at baseline.  Psychiatric:        Mood and Affect: Mood normal.        Behavior: Behavior normal.        Thought Content: Thought content normal.        Assessment & Plan:     ICD-10-CM   1. Complex laceration of face, initial encounter  S01.91XA     I recommend a warm patch to the eye to help keep it from getting crusting.  Recommend that if he uses the oxycodone that he plan on resting for an hour or so so that it does not make  him dizzy or unsteady on his feet I would like to see him back in 1 to 2 weeks of his eye is swelling has subsided we are still to not have to do surgery if possible.  Agrees with this plan.  Pictures were obtained of the patient and placed in the chart with the patient's or guardian's permission.

## 2019-08-15 ENCOUNTER — Other Ambulatory Visit: Payer: Self-pay

## 2019-08-15 ENCOUNTER — Ambulatory Visit (INDEPENDENT_AMBULATORY_CARE_PROVIDER_SITE_OTHER): Payer: PPO | Admitting: Internal Medicine

## 2019-08-15 ENCOUNTER — Encounter: Payer: Self-pay | Admitting: Internal Medicine

## 2019-08-15 VITALS — BP 148/51 | HR 53 | Temp 96.5°F | Resp 18 | Ht 72.0 in | Wt 158.0 lb

## 2019-08-15 DIAGNOSIS — S0232XD Fracture of orbital floor, left side, subsequent encounter for fracture with routine healing: Secondary | ICD-10-CM | POA: Diagnosis not present

## 2019-08-15 DIAGNOSIS — W19XXXD Unspecified fall, subsequent encounter: Secondary | ICD-10-CM | POA: Diagnosis not present

## 2019-08-15 NOTE — Progress Notes (Signed)
Subjective:    Patient ID: Brandon Burke, male    DOB: [birth year], 86 y.o.   MRN: [id number]  DOS:  08/15/2019 Type of visit - description: TCM Admitted to the hospital 08/09/2019, discharged 2 days later. He was admitted after a mechanical fall, CT showed a facial blowout fracture, was admitted due to drowsy and vomiting felt to be possibly a head contusion. Chemistries, CBC satisfactory.  CT 08/09/2019:  1. Comminuted displaced and comminuted blowout fracture of the left orbit involving the medial and inferior orbital walls. Large amount of retrobulbar air and soft tissue edema. Small focus of high density within the posterior chamber of the left globe may represent small focus of hemorrhage. No obvious globe deformity. 2. The left inferior rectus and superior oblique muscles are poorly defined due to adjacent air and edema. 3. There is air in the cavernous sinuses, however no fluid or opacification of adjacent sphenoid sinuses. This air may be tracking from left orbital fracture or secondary to IV catheter placement. Lack of fluid in the sphenoid sinus argues against skull base fracture.  Review of Systems Since he left the hospital he is back at home Still having pain around the area of the impact, no headache per se. Had nausea yesterday, responded to Zofran, no vomiting. Appetite is somewhat decreased. Denies neck pain, shoulder pain. No shortness of breath or cough  Past Medical History:  Diagnosis Date  . DVT (deep venous thrombosis) ([place])    hx of 2001 (after prostate surgery)  . Hyperlipidemia   . Mitral valve prolapse   . PE (pulmonary embolism)    5-09:was referred to hematology, and they recommend to discontinue Coumadin 5/11  . Prostate cancer [facility])     released from urology in 2010,needs yearly PSAs with PCP    Past Surgical History:  Procedure Laterality Date  . PROSTATECTOMY  2000    Allergies as of 08/15/2019      Reactions   Nitrofuran  Derivatives    headache   Sulfonamide Derivatives Nausea And Vomiting      Medication List       Accurate as of August 15, 2019  1:02 PM. If you have any questions, ask your nurse or doctor.        ARTIFICIAL TEARS OP Apply to eye daily as needed.   atenolol 50 MG tablet Commonly known as: TENORMIN Take 1 tablet (50 mg total) by mouth daily.   azelastine 0.1 % nasal spray Commonly known as: ASTELIN Place 2 sprays into both nostrils at bedtime as needed for rhinitis or allergies.   diphenhydramine-acetaminophen 25-500 MG Tabs tablet Commonly known as: TYLENOL PM Take 1 tablet by mouth at bedtime as needed.   fluticasone 50 MCG/ACT nasal spray Commonly known as: FLONASE Place 2 sprays into both nostrils daily as needed for allergies or rhinitis.   folic acid A999333 MCG tablet Commonly known as: FOLVITE Take 400 mcg by mouth at bedtime.   multivitamin,tx-minerals tablet Take 1 tablet by mouth daily.   ondansetron 4 MG tablet Commonly known as: Zofran Take 1 tablet (4 mg total) by mouth every 8 (eight) hours as needed for nausea or vomiting.   oxyCODONE 5 MG immediate release tablet Commonly known as: Roxicodone Take 1 tablet (5 mg total) by mouth every 6 (six) hours as needed for severe pain.   polyethylene glycol 17 g packet Commonly known as: MIRALAX / GLYCOLAX Take 17 g by mouth daily.   pravastatin 40 MG tablet Commonly known  as: PRAVACHOL Take 1 tablet (40 mg total) by mouth at bedtime.   timolol 0.5 % ophthalmic solution Commonly known as: TIMOPTIC Place 1 drop into both eyes 2 (two) times daily.          Objective:   Physical Exam BP (!) 148/51 (BP Location: Left Arm, Patient Position: Sitting, Cuff Size: Normal)   Pulse (!) 53   Temp (!) 96.5 F (35.8 C) (Temporal)   Resp 18   Ht 6' (1.829 m)   Wt 158 lb (71.7 kg)   SpO2 98%   BMI 21.43 kg/m  General:   Well developed, NAD, BMI noted. HEENT:  Normocephalic .  Face with multiple ecchymosis,  left eye is closed Lungs:  CTA B Normal respiratory effort, no intercostal retractions, no accessory muscle use. Heart: RRR,  no murmur.  Lower extremities: no pretibial edema bilaterally  Skin: Not pale. Not jaundice Neurologic:  alert & oriented X3.  Speech normal, gait assisted by a walker Psych--  Cognition and judgment appear intact.  Cooperative with normal attention span and concentration.  Behavior appropriate. No anxious or depressed appearing.      Assessment      Assessment Hyperlipidemia Prostate cancer, released from urology 2010, Rx yearly PSAs by PCP HEM:DVT 2001 after surgery, PE 2009, eval by hematology, they recommend DC Coumadin 2011 H/o MVP-- on BB glaucoma Sees dermatology q 6 months   PLAN Mechanical fall: Prevention already discussed at the hospital & reinforced at today.  He is using a walker. Facial fracture: As described above, he saw plastic surgery yesterday.  We called the ophthalmology office and arrange for a visit with them tomorrow.  Further advised by plastic surgery and ophthalmology. Aspirin: On hold since the accident, recommend to hold it for 2 weeks. Follow-up with me as a scheduled for June, sooner if needed.    This visit occurred during the SARS-CoV-2 public health emergency.  Safety protocols were in place, including screening questions prior to the visit, additional usage of staff PPE, and extensive cleaning of exam room while observing appropriate contact time as indicated for disinfecting solutions.

## 2019-08-15 NOTE — Progress Notes (Signed)
Pre visit review using our clinic review tool, if applicable. No additional management support is needed unless otherwise documented below in the visit note. 

## 2019-08-15 NOTE — Patient Instructions (Signed)
Dr. Eulas Post at [facility]  08/16/2019 at 9:45am  [place], [place], [place] [address] [phone]

## 2019-08-15 NOTE — Assessment & Plan Note (Signed)
Mechanical fall: Prevention already discussed at the hospital & reinforced at today.  He is using a walker. Facial fracture: As described above, he saw plastic surgery yesterday.  We called the ophthalmology office and arrange for a visit with them tomorrow.  Further advised by plastic surgery and ophthalmology. Aspirin: On hold since the accident, recommend to hold it for 2 weeks. Follow-up with me as a scheduled for June, sooner if needed.

## 2019-08-16 ENCOUNTER — Inpatient Hospital Stay (HOSPITAL_COMMUNITY): Payer: PPO | Admitting: Certified Registered"

## 2019-08-16 ENCOUNTER — Encounter (HOSPITAL_COMMUNITY)
Admission: AD | Disposition: A | Discharge status: Home / Self Care | Payer: Self-pay | Source: Ambulatory Visit | Attending: Ophthalmology

## 2019-08-16 ENCOUNTER — Encounter (HOSPITAL_COMMUNITY): Payer: Self-pay | Admitting: Ophthalmology

## 2019-08-16 ENCOUNTER — Ambulatory Visit (HOSPITAL_COMMUNITY)
Admission: AD | Admit: 2019-08-16 | Discharge: 2019-08-16 | Disposition: A | Discharge status: Home / Self Care | Payer: PPO | Source: Ambulatory Visit | Attending: Ophthalmology | Admitting: Ophthalmology

## 2019-08-16 ENCOUNTER — Other Ambulatory Visit: Payer: Self-pay | Admitting: Ophthalmology

## 2019-08-16 DIAGNOSIS — S0532XA Ocular laceration without prolapse or loss of intraocular tissue, left eye, initial encounter: Secondary | ICD-10-CM | POA: Insufficient documentation

## 2019-08-16 DIAGNOSIS — I10 Essential (primary) hypertension: Secondary | ICD-10-CM | POA: Insufficient documentation

## 2019-08-16 DIAGNOSIS — I341 Nonrheumatic mitral (valve) prolapse: Secondary | ICD-10-CM | POA: Diagnosis not present

## 2019-08-16 DIAGNOSIS — Z8546 Personal history of malignant neoplasm of prostate: Secondary | ICD-10-CM | POA: Insufficient documentation

## 2019-08-16 DIAGNOSIS — Z86718 Personal history of other venous thrombosis and embolism: Secondary | ICD-10-CM | POA: Diagnosis not present

## 2019-08-16 DIAGNOSIS — E785 Hyperlipidemia, unspecified: Secondary | ICD-10-CM | POA: Insufficient documentation

## 2019-08-16 DIAGNOSIS — Z7982 Long term (current) use of aspirin: Secondary | ICD-10-CM | POA: Diagnosis not present

## 2019-08-16 DIAGNOSIS — Z79899 Other long term (current) drug therapy: Secondary | ICD-10-CM | POA: Insufficient documentation

## 2019-08-16 DIAGNOSIS — W19XXXA Unspecified fall, initial encounter: Secondary | ICD-10-CM | POA: Insufficient documentation

## 2019-08-16 DIAGNOSIS — S0285XA Fracture of orbit, unspecified, initial encounter for closed fracture: Secondary | ICD-10-CM | POA: Insufficient documentation

## 2019-08-16 DIAGNOSIS — Z86711 Personal history of pulmonary embolism: Secondary | ICD-10-CM | POA: Insufficient documentation

## 2019-08-16 DIAGNOSIS — S0512XA Contusion of eyeball and orbital tissues, left eye, initial encounter: Secondary | ICD-10-CM | POA: Diagnosis not present

## 2019-08-16 DIAGNOSIS — Z20822 Contact with and (suspected) exposure to covid-19: Secondary | ICD-10-CM | POA: Insufficient documentation

## 2019-08-16 DIAGNOSIS — S0522XA Ocular laceration and rupture with prolapse or loss of intraocular tissue, left eye, initial encounter: Secondary | ICD-10-CM | POA: Diagnosis not present

## 2019-08-16 DIAGNOSIS — M1990 Unspecified osteoarthritis, unspecified site: Secondary | ICD-10-CM | POA: Diagnosis not present

## 2019-08-16 DIAGNOSIS — S0230XA Fracture of orbital floor, unspecified side, initial encounter for closed fracture: Secondary | ICD-10-CM | POA: Diagnosis not present

## 2019-08-16 DIAGNOSIS — H4032X3 Glaucoma secondary to eye trauma, left eye, severe stage: Secondary | ICD-10-CM | POA: Diagnosis not present

## 2019-08-16 DIAGNOSIS — H2102 Hyphema, left eye: Secondary | ICD-10-CM | POA: Diagnosis not present

## 2019-08-16 DIAGNOSIS — H4312 Vitreous hemorrhage, left eye: Secondary | ICD-10-CM | POA: Diagnosis not present

## 2019-08-16 HISTORY — PX: RUPTURED GLOBE EXPLORATION AND REPAIR: SHX2366

## 2019-08-16 HISTORY — DX: Essential (primary) hypertension: I10

## 2019-08-16 HISTORY — DX: Constipation, unspecified: K59.00

## 2019-08-16 HISTORY — DX: Other seasonal allergic rhinitis: J30.2

## 2019-08-16 LAB — RESPIRATORY PANEL BY RT PCR (FLU A&B, COVID)
Influenza A by PCR: NEGATIVE
Influenza B by PCR: NEGATIVE
SARS Coronavirus 2 by RT PCR: NEGATIVE

## 2019-08-16 SURGERY — REPAIR, RUPTURE, GLOBE
Anesthesia: General | Site: Eye | Laterality: Left

## 2019-08-16 MED ORDER — PROPOFOL 10 MG/ML IV BOLUS
INTRAVENOUS | Status: DC | PRN
  Administered 2019-08-16: 80 mg via INTRAVENOUS

## 2019-08-16 MED ORDER — CEFAZOLIN SUBCONJUNCTIVAL INJECTION 100 MG/0.5 ML
200.0000 mg | INJECTION | SUBCONJUNCTIVAL | Status: DC
  Filled 2019-08-16: qty 5

## 2019-08-16 MED ORDER — BSS IO SOLN
INTRAOCULAR | Status: DC | PRN
  Administered 2019-08-16: 15 mL via INTRAOCULAR

## 2019-08-16 MED ORDER — CEFAZOLIN SUBCONJUNCTIVAL INJECTION 100 MG/0.5 ML
INJECTION | SUBCONJUNCTIVAL | Status: DC | PRN
  Administered 2019-08-16: 200 mg via SUBCONJUNCTIVAL

## 2019-08-16 MED ORDER — SODIUM CHLORIDE 0.9 % IV SOLN: INTRAVENOUS | Status: DC

## 2019-08-16 MED ORDER — BSS IO SOLN
INTRAOCULAR | Status: AC
  Filled 2019-08-16: qty 15

## 2019-08-16 MED ORDER — LIDOCAINE 2% (20 MG/ML) 5 ML SYRINGE
INTRAMUSCULAR | Status: DC | PRN
  Administered 2019-08-16: 40 mg via INTRAVENOUS

## 2019-08-16 MED ORDER — PHENYLEPHRINE HCL-NACL 10-0.9 MG/250ML-% IV SOLN
INTRAVENOUS | Status: DC | PRN
  Administered 2019-08-16: 30 ug/min via INTRAVENOUS

## 2019-08-16 MED ORDER — FENTANYL CITRATE (PF) 250 MCG/5ML IJ SOLN
INTRAMUSCULAR | Status: DC | PRN
  Administered 2019-08-16 (×3): 50 ug via INTRAVENOUS

## 2019-08-16 MED ORDER — DEXAMETHASONE SODIUM PHOSPHATE 10 MG/ML IJ SOLN
INTRAMUSCULAR | Status: DC | PRN
  Administered 2019-08-16: 10 mg

## 2019-08-16 MED ORDER — SODIUM HYALURONATE 10 MG/ML IO SOLN
INTRAOCULAR | Status: AC
  Filled 2019-08-16: qty 0.85

## 2019-08-16 MED ORDER — FENTANYL CITRATE (PF) 250 MCG/5ML IJ SOLN
INTRAMUSCULAR | Status: AC
  Filled 2019-08-16: qty 5

## 2019-08-16 MED ORDER — BACITRACIN-POLYMYXIN B 500-10000 UNIT/GM OP OINT
TOPICAL_OINTMENT | OPHTHALMIC | Status: AC
  Filled 2019-08-16: qty 3.5

## 2019-08-16 MED ORDER — PROPOFOL 10 MG/ML IV BOLUS
INTRAVENOUS | Status: AC
  Filled 2019-08-16: qty 20

## 2019-08-16 MED ORDER — FENTANYL CITRATE (PF) 100 MCG/2ML IJ SOLN: 25.0000 ug | INTRAMUSCULAR | Status: DC | PRN

## 2019-08-16 MED ORDER — ONDANSETRON HCL 4 MG/2ML IJ SOLN
INTRAMUSCULAR | Status: AC
  Filled 2019-08-16: qty 2

## 2019-08-16 MED ORDER — SUGAMMADEX SODIUM 200 MG/2ML IV SOLN
INTRAVENOUS | Status: DC | PRN
  Administered 2019-08-16: 200 mg via INTRAVENOUS

## 2019-08-16 MED ORDER — BACITRACIN-POLYMYXIN B 500-10000 UNIT/GM OP OINT
TOPICAL_OINTMENT | OPHTHALMIC | Status: DC | PRN
  Administered 2019-08-16: 1 via OPHTHALMIC

## 2019-08-16 MED ORDER — DEXAMETHASONE SODIUM PHOSPHATE 10 MG/ML IJ SOLN
INTRAMUSCULAR | Status: AC
  Filled 2019-08-16: qty 1

## 2019-08-16 MED ORDER — DEXAMETHASONE SODIUM PHOSPHATE 10 MG/ML IJ SOLN
INTRAMUSCULAR | Status: DC | PRN
  Administered 2019-08-16: 5 mg via INTRAVENOUS

## 2019-08-16 MED ORDER — BSS PLUS IO SOLN
INTRAOCULAR | Status: AC
  Filled 2019-08-16: qty 500

## 2019-08-16 MED ORDER — ROCURONIUM BROMIDE 10 MG/ML (PF) SYRINGE
PREFILLED_SYRINGE | INTRAVENOUS | Status: DC | PRN
  Administered 2019-08-16: 20 mg via INTRAVENOUS
  Administered 2019-08-16: 50 mg via INTRAVENOUS
  Administered 2019-08-16: 20 mg via INTRAVENOUS

## 2019-08-16 MED ORDER — NA CHONDROIT SULF-NA HYALURON 40-30 MG/ML IO SOLN
INTRAOCULAR | Status: AC
  Filled 2019-08-16: qty 0.5

## 2019-08-16 MED ORDER — ONDANSETRON HCL 4 MG/2ML IJ SOLN
INTRAMUSCULAR | Status: DC | PRN
  Administered 2019-08-16: 4 mg via INTRAVENOUS

## 2019-08-16 SURGICAL SUPPLY — 80 items
ACCESSORY FRAGMATOME (MISCELLANEOUS) IMPLANT
APL SRG 3 HI ABS STRL LF PLS (MISCELLANEOUS) ×1
APPLICATOR DR MATTHEWS STRL (MISCELLANEOUS) ×3 IMPLANT
BAG URINE DRAINAGE (UROLOGICAL SUPPLIES) IMPLANT
BALL CTTN LRG ABS STRL LF (GAUZE/BANDAGES/DRESSINGS) ×3
BAND WRIST GAS GREEN (MISCELLANEOUS) IMPLANT
BLADE EYE CATARACT 19 1.4 BEAV (BLADE) IMPLANT
BLADE MVR KNIFE 19G (BLADE) IMPLANT
BLADE SURG 15 STRL LF DISP TIS (BLADE) IMPLANT
BLADE SURG 15 STRL SS (BLADE)
CANNULA ANT CHAM MAIN (OPHTHALMIC RELATED) IMPLANT
CANNULA SUBRETINAL FLUID 20G (BLADE) IMPLANT
CATH FOLEY 2WAY SLVR  5CC 16FR (CATHETERS)
CATH FOLEY 2WAY SLVR 5CC 16FR (CATHETERS) IMPLANT
CORD BIPOLAR FORCEPS 12FT (ELECTRODE) IMPLANT
COTTONBALL LRG STERILE PKG (GAUZE/BANDAGES/DRESSINGS) ×9 IMPLANT
COVER MAYO STAND STRL (DRAPES) IMPLANT
COVER SURGICAL LIGHT HANDLE (MISCELLANEOUS) ×3 IMPLANT
COVER WAND RF STERILE (DRAPES) ×3 IMPLANT
DRAPE OPHTHALMIC 77X100 STRL (CUSTOM PROCEDURE TRAY) ×3 IMPLANT
ERASER HMR WETFIELD 23G BP (MISCELLANEOUS) IMPLANT
FILTER BLUE MILLIPORE (MISCELLANEOUS) IMPLANT
GAS OPHTHALMIC (MISCELLANEOUS) IMPLANT
GAS WRIST BAND GREEN (MISCELLANEOUS)
GLOVE SS BIOGEL STRL SZ 6.5 (GLOVE) ×1 IMPLANT
GLOVE SUPERSENSE BIOGEL SZ 6.5 (GLOVE)
GLOVE SURG 8.5 LATEX PF (GLOVE) ×3 IMPLANT
GOWN STRL REUS W/ TWL LRG LVL3 (GOWN DISPOSABLE) ×2 IMPLANT
GOWN STRL REUS W/TWL LRG LVL3 (GOWN DISPOSABLE) ×6
KIT BASIN OR (CUSTOM PROCEDURE TRAY) ×3 IMPLANT
KIT PERFLUORON PROCEDURE 5ML (MISCELLANEOUS) IMPLANT
KIT TURNOVER KIT B (KITS) ×3 IMPLANT
KNIFE CRESCENT 1.75 EDGEAHEAD (BLADE) IMPLANT
KNIFE GRIESHABER SHARP 2.5MM (MISCELLANEOUS) IMPLANT
MARKER SKIN DUAL TIP RULER LAB (MISCELLANEOUS) IMPLANT
NDL 18GX1X1/2 (RX/OR ONLY) (NEEDLE) ×1 IMPLANT
NDL 25GX 5/8IN NON SAFETY (NEEDLE) ×1 IMPLANT
NDL HYPO 30X.5 LL (NEEDLE) ×2 IMPLANT
NDL PRECISIONGLIDE 27X1.5 (NEEDLE) IMPLANT
NEEDLE 18GX1X1/2 (RX/OR ONLY) (NEEDLE) IMPLANT
NEEDLE 25GX 5/8IN NON SAFETY (NEEDLE) IMPLANT
NEEDLE HYPO 30X.5 LL (NEEDLE) ×6 IMPLANT
NEEDLE PRECISIONGLIDE 27X1.5 (NEEDLE) IMPLANT
NS IRRIG 1000ML POUR BTL (IV SOLUTION) ×3 IMPLANT
PACK VITRECTOMY CUSTOM (CUSTOM PROCEDURE TRAY) ×3 IMPLANT
PACK VITRECTOMY PIC MCHSVP (PACKS) IMPLANT
PAD ARMBOARD 7.5X6 YLW CONV (MISCELLANEOUS) ×6 IMPLANT
PROBE LASER 20G CVD ENDOCULAR (MISCELLANEOUS) ×1 IMPLANT
PROBE LASER LIGHT 20GA (MISCELLANEOUS) IMPLANT
REPL STRA BRUSH NDL (NEEDLE) IMPLANT
REPL STRA BRUSH NEEDLE (NEEDLE) IMPLANT
RESERVOIR BACK FLUSH (MISCELLANEOUS) IMPLANT
ROLLS DENTAL (MISCELLANEOUS) ×6 IMPLANT
SCRAPER DIAMOND DUST MEMBRANE (MISCELLANEOUS) IMPLANT
SET VGFI TUBING 8065808002 (SET/KITS/TRAYS/PACK) IMPLANT
SPEAR EYE SURG WECK-CEL (MISCELLANEOUS) IMPLANT
STOPCOCK 4 WAY LG BORE MALE ST (IV SETS) IMPLANT
SUT CHROMIC 7 0 TG140 8 (SUTURE) IMPLANT
SUT ETHILON 10 0 BV100 4 (SUTURE) IMPLANT
SUT ETHILON 10 0 BV75 4 (SUTURE) IMPLANT
SUT ETHILON 10 0 CS140 6 (SUTURE) IMPLANT
SUT ETHILON 9 0 TG140 8 (SUTURE) IMPLANT
SUT SILK 2 0 (SUTURE)
SUT SILK 2-0 18XBRD TIE 12 (SUTURE) IMPLANT
SUT VICRYL 7 0 TG140 8 (SUTURE) IMPLANT
SUT VICRYL 8 0 TG140 8 (SUTURE) ×2 IMPLANT
SUT VICRYL ABS 6-0 S29 18IN (SUTURE) ×2 IMPLANT
SWAB COLLECTION DEVICE MRSA (MISCELLANEOUS) IMPLANT
SWAB CULTURE ESWAB REG 1ML (MISCELLANEOUS) IMPLANT
SYR 10ML LL (SYRINGE) IMPLANT
SYR 20ML LL LF (SYRINGE) ×3 IMPLANT
SYR 50ML SLIP (SYRINGE) IMPLANT
SYR 5ML LL (SYRINGE) IMPLANT
SYR BULB 3OZ (MISCELLANEOUS) ×3 IMPLANT
SYR TB 1ML LUER SLIP (SYRINGE) ×3 IMPLANT
TOWEL GREEN STERILE FF (TOWEL DISPOSABLE) ×5 IMPLANT
TUBING HIGH PRESS EXTEN 6IN (TUBING) IMPLANT
VITREORETINAL VISCODISSEC (MISCELLANEOUS) IMPLANT
WATER STERILE IRR 1000ML POUR (IV SOLUTION) ×3 IMPLANT
WIPE INSTRUMENT VISIWIPE 73X73 (MISCELLANEOUS) ×3 IMPLANT

## 2019-08-16 NOTE — Transfer of Care (Signed)
Immediate Anesthesia Transfer of Care Note  Patient: Brandon Burke [facility]  Procedure(s) Performed: OPEN GLOBE EXPLORATION EYE POSSIBLE ANTERIOR  CHAMBER [place] OUT (Left Eye)  Patient Location: PACU  Anesthesia Type:General  Level of Consciousness: awake, alert  and patient cooperative  Airway & Oxygen Therapy: Patient Spontanous Breathing  Post-op Assessment: Report given to RN and Post -op Vital signs reviewed and stable  Post vital signs: Reviewed and stable  Last Vitals:  Vitals Value Taken Time  BP 141/58 08/16/19 2156  Temp 36.2 C 08/16/19 2154  Pulse 62 08/16/19 2157  Resp 16 08/16/19 2157  SpO2 95 % 08/16/19 2157  Vitals shown include unvalidated device data.  Last Pain:  Vitals:   08/16/19 1749  TempSrc: Oral  PainSc: 0-No pain      Patients Stated Pain Goal: 3 ([id number])  Complications: No apparent anesthesia complications

## 2019-08-16 NOTE — H&P (Signed)
Chief Complaint: trauma to OS  HPI: Patient sustained fall 1 week ago and sustained left sided orbital fractures, eyelid skin laceration, hyphema OS. Presented to ophthalmology (Dr. Eulas Post on 08/16/19) with severely elevated eye pressures and concern for prolapsed iris and ruptured globe OS. C/o loss of vision, pain, and photophobia OS.    ROS: denies fever, chills, chest pain, dyspnea, n/v/d, headache. otherwise as in HPI   PMH allergies, DVT, prostate CA, HLD, mitral valve prolapse, sinus issues PSH CE/IOL OU MED see MAR (eye meds: travoprost qhs OU azi BID OD, AT prn, AT ung qhs OU) ALL sulfa and nitrostat FAM fam hx of glc in mother SOC does not smoke   EXAMINATION  VA w/correction OD: 20/40   OS: HM   Pupils:  OD: Equal, round, reactive, no APD OS: fixed, dilated, APD by reverse testing  T(app 1245pm): OD: 15   mm Hg OS:  41  mm Hg  CVF: full to finger counting OD, limited esp sup/inf OS EOM: full OD, limited sup/inf>nas/temp OS  Slit lamp Exam: Ext/Lids: OD scurf, MGD, OS periorbital ecchymosis and edema, healing laceration LUL Conj/Sclera: white and quiet OD, OS subconj heme, ?scleral/conj lac temporally Cornea: OD mild haze, OS very hazy, no corneal lac AC: Deep and Quiet OD, OS hyphema, very deep Iris: Round and Flat OD, OS fixed and dilated/ischemic Lens: PCIOL OU  DFE performed earlier today  Labs/Imaging:  Recent CT orbits 08/09/19:: 1. Comminuted displaced and comminuted blowout fracture of the left orbit involving the medial and inferior orbital walls. Large amount of retrobulbar air and soft tissue edema. Small focus of high density within the posterior chamber of the left globe may represent small focus of hemorrhage. No obvious globe deformity. 2. The left inferior rectus and superior oblique muscles are poorly defined due to adjacent air and edema. 3. There is air in the cavernous sinuses, however no fluid or opacification of adjacent sphenoid  sinuses. This air may be tracking from left orbital fracture or secondary to IV catheter placement. Lack of fluid in the sphenoid sinus argues against skull base fracture.   Imp/Plan:  1. Hyphema, Concern for open globe, elevated IOP OS - long discussion regarding r/b/a to surgical exploration/repair/possible washout of anterior chamber and recommend this to prevent infection and improve IOP. We discussed risks of not intervening including infection and loss of the eye. We discussed that even with surgery and repair, the eye likely will not retain its function prior to the accident, hyphema, and period of elevated eye pressure.    Lonia Skinner, M.D. Ophthalmology [facility]

## 2019-08-16 NOTE — Anesthesia Preprocedure Evaluation (Addendum)
Anesthesia Evaluation  Patient identified by MRN, date of birth, ID band Patient awake    Reviewed: Allergy & Precautions, NPO status , Patient's Chart, lab work & pertinent test results, reviewed documented beta blocker date and time   History of Anesthesia Complications Negative for: history of anesthetic complications  Airway Mallampati: I  TM Distance: >3 FB Neck ROM: Full    Dental  (+) Dental Advisory Given   Pulmonary neg pulmonary ROS,  415/2021 SARS coronavirus NEG    breath sounds clear to auscultation       Cardiovascular hypertension, Pt. on medications and Pt. 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  Reproductive/Obstetrics                            Anesthesia Physical Anesthesia Plan  ASA: III  Anesthesia Plan: General   Post-op Pain Management:    Induction: Intravenous  PONV Risk Score and Plan: 2 and Ondansetron and Dexamethasone  Airway Management Planned: Oral ETT  Additional Equipment:   Intra-op Plan:   Post-operative Plan: Extubation in OR  Informed Consent: I have reviewed the patients History and Physical, chart, labs and discussed the procedure including the risks, benefits and alternatives for the proposed anesthesia with the patient or authorized representative who has indicated his/her understanding and acceptance.     Dental advisory given  Plan  Discussed with: CRNA and Surgeon  Anesthesia Plan Comments:        Anesthesia Quick Evaluation

## 2019-08-16 NOTE — Anesthesia Procedure Notes (Signed)
Procedure Name: Intubation Date/Time: 08/16/2019 7:42 PM Performed by: Janace Litten, CRNA Pre-anesthesia Checklist: Patient identified, Emergency Drugs available, Suction available and Patient being monitored Patient Re-evaluated:Patient Re-evaluated prior to induction Oxygen Delivery Method: Circle System Utilized Preoxygenation: Pre-oxygenation with 100% oxygen Induction Type: IV induction Ventilation: Mask ventilation without difficulty Laryngoscope Size: Miller and 2 Grade View: Grade I Tube type: Oral Tube size: 7.5 mm Number of attempts: 1 Airway Equipment and Method: Stylet Placement Confirmation: ETT inserted through vocal cords under direct vision,  positive ETCO2 and breath sounds checked- equal and bilateral Secured at: 23 cm Tube secured with: Tape Dental Injury: Teeth and Oropharynx as per pre-operative assessment

## 2019-08-16 NOTE — Op Note (Signed)
Procedure: Globe exploration with possible anterior chamber washout, left eye. Indication: Uveal tissue on the ocular surface concerning for history of Ruptured globe Surgeon: Lonia Skinner, MD Anesthesia: General  Procedure Details: After proper informed consent was obtained, the patient was taken to the operating room where the patient was placed under general anesthesia in the supine position.  Patient was then prepped and draped in the usual fashion, taking care to avoid excessive pressure on the globe.  A speculum was then placed with the surgical microscope brought into place.   A paracentesis was made in the cornea superonasally using an MVR blade and the anterior chamber was lightly irrigated with balanced salt solution. A conjunctival peritomy was made superiorly and extended temporally and inferiorly for 180 degrees using a combination of blunt and sharp dissection with Wescott scissors. There was pigmented tissue, likely uveal tissue present under the conjunctiva temporally. Dissection to bare sclera was carried posterior in the temporal quadrant (and the temporal 180 degrees of the globe) and the lateral rectus muscle was isolated using a Greene and then Shamarr Faucett's muscle hook. A 6-0 Vicryl suture was placed partial thickness through the limbus superiorly/superotemporally and the globe was lightly retracted nasally to further expose the temporal sclera, taking care to not place excessive pressure on the globe. The sclera under the lateral rectus and posterior to the insertion of the lateral rectus was examined and no rupture was present in these areas. The dissection between conjunctiva/tenon's capsule and sclera was extended posteriorly to the equator of the globe, though the site of scleral rupture was not able to be visualized and was thought to be posterior to the equator. After this, the conjunctival peritomy was closed using multiple interrupted 8-0 Vicryl sutures. There was some conjunctival  tissue loss temporally from the initial injury so that the conjunctiva could not be closed completely without significant tension and in these areas the conjunctiva was fastened to episclera using 8-0 Vicryl to provide some coverage and allow for re-epithelialization in uncovered areas. The intraocular pressure was adjusted by releasing aqueous from the paracentesis with pressure on the posterior lip of the wound. The wound was hydrated with BSS and was found to be watertight. When the intraocular pressure was lowered the hyphema was observed to expand slightly and thus further anterior chamber washout was not performed given the propensity for rebleeding. Hemostasis was achieved. Ancef and dexamethasone were injected subconjunctivally. Bacitracin ointment was placed over the eye and a patch and shield were applied. The patient tolerated the procedure well.  Specimens: none Complications: none Disposition: The patient will be seen in clinic tomorrow and the patch and shield should be left on until patient is seen by ophthalmology.

## 2019-08-16 NOTE — Progress Notes (Signed)
No EKG needed per Dr.Jackson.

## 2019-08-17 NOTE — Anesthesia Postprocedure Evaluation (Signed)
Anesthesia Post Note  Patient: Brandon Burke [facility]  Procedure(s) Performed: OPEN GLOBE EXPLORATION EYE POSSIBLE ANTERIOR  CHAMBER [place] OUT (Left Eye)     Patient location during evaluation: PACU Anesthesia Type: General Level of consciousness: awake and alert, oriented and patient cooperative (slightly confused) Pain management: pain level controlled Vital Signs Assessment: post-procedure vital signs reviewed and stable Respiratory status: spontaneous breathing, nonlabored ventilation and respiratory function stable Cardiovascular status: blood pressure returned to baseline and stable Postop Assessment: no apparent nausea or vomiting Anesthetic complications: no    Last Vitals:  Vitals:   08/16/19 2230 08/16/19 2245  BP: (!) 135/57 (!) 141/52  Pulse: 63 (!) 57  Resp: 18 18  Temp:  37.2 C  SpO2: 94% 96%    Last Pain:  Vitals:   08/16/19 2245  TempSrc:   PainSc: 0-No pain                 Pinchas Reither,E. [name]

## 2019-08-23 DIAGNOSIS — Z8546 Personal history of malignant neoplasm of prostate: Secondary | ICD-10-CM | POA: Diagnosis not present

## 2019-08-23 DIAGNOSIS — R2681 Unsteadiness on feet: Secondary | ICD-10-CM | POA: Diagnosis not present

## 2019-08-23 DIAGNOSIS — M6281 Muscle weakness (generalized): Secondary | ICD-10-CM | POA: Diagnosis not present

## 2019-08-23 DIAGNOSIS — Z86718 Personal history of other venous thrombosis and embolism: Secondary | ICD-10-CM | POA: Diagnosis not present

## 2019-08-23 DIAGNOSIS — S02849D Fracture of lateral orbital wall, unspecified side, subsequent encounter for fracture with routine healing: Secondary | ICD-10-CM | POA: Diagnosis not present

## 2019-08-23 DIAGNOSIS — Z9181 History of falling: Secondary | ICD-10-CM | POA: Diagnosis not present

## 2019-08-23 DIAGNOSIS — S02842D Fracture of lateral orbital wall, left side, subsequent encounter for fracture with routine healing: Secondary | ICD-10-CM | POA: Diagnosis not present

## 2019-08-24 ENCOUNTER — Encounter (INDEPENDENT_AMBULATORY_CARE_PROVIDER_SITE_OTHER): Payer: Self-pay | Admitting: Ophthalmology

## 2019-08-24 ENCOUNTER — Other Ambulatory Visit: Payer: Self-pay

## 2019-08-24 ENCOUNTER — Ambulatory Visit (INDEPENDENT_AMBULATORY_CARE_PROVIDER_SITE_OTHER): Payer: PPO | Admitting: Ophthalmology

## 2019-08-24 DIAGNOSIS — H4302 Vitreous prolapse, left eye: Secondary | ICD-10-CM | POA: Diagnosis not present

## 2019-08-24 DIAGNOSIS — H3581 Retinal edema: Secondary | ICD-10-CM | POA: Diagnosis not present

## 2019-08-24 DIAGNOSIS — S0285XS Fracture of orbit, unspecified, sequela: Secondary | ICD-10-CM

## 2019-08-24 DIAGNOSIS — H2102 Hyphema, left eye: Secondary | ICD-10-CM

## 2019-08-24 DIAGNOSIS — Z961 Presence of intraocular lens: Secondary | ICD-10-CM | POA: Diagnosis not present

## 2019-08-24 DIAGNOSIS — S0592XS Unspecified injury of left eye and orbit, sequela: Secondary | ICD-10-CM | POA: Diagnosis not present

## 2019-08-24 DIAGNOSIS — H401130 Primary open-angle glaucoma, bilateral, stage unspecified: Secondary | ICD-10-CM | POA: Diagnosis not present

## 2019-08-24 DIAGNOSIS — H4312 Vitreous hemorrhage, left eye: Secondary | ICD-10-CM

## 2019-08-24 NOTE — Progress Notes (Addendum)
[facility] Note  08/24/2019     CHIEF COMPLAINT Patient presents for Retina Evaluation   HISTORY OF PRESENT ILLNESS: Brandon Burke is a 86 y.o. male who presents to the clinic today for:   HPI    Retina Evaluation    In left eye.  This started 1 week ago.  Duration of 1 week.  Associated Symptoms Floaters, Distortion, Photophobia, Pain, Redness, Glare and Trauma.  Context:  distance vision, mid-range vision and near vision.  Treatments tried include eye drops and artificial tears.  Response to treatment was no improvement.  I, the attending physician,  performed the HPI with the patient and updated documentation appropriately.          Comments    86 y/o male pt referred by Dr. Eulas Post @ Dr. Rachael Fee office for eval of ocular trauma OS.  Last saw Dr. Eulas Post 08/16/19.  Pt took a fall after slipping on a garden hose 1 wk ago, and damaged the left side of his head.  Ever since, eye has been very red, very painful, photophobic, and all he can see OS is "red dots."  VA OD okay cc.  Pain OS today is the best its been in a wk, at a pain level of 2.  Denies FOL.  Timolol BID OU Azopt BID OU Travoprost QHS OU AT prn OU       Last edited by [name], MD on 08/24/2019 12:28 PM. (History)    pt is here on the referral of Dr. Eulas Post for eye trauma OS, Dr. Eulas Post requested a B-scan, pt states today his eye is the best it's been since initial injury, he states no change in vision, he can see fingers being waved in front of him, pt has hx of glaucoma being   Referring physician: Lonia Skinner, MD [address] [place],  [place] [address]  HISTORICAL INFORMATION:   Selected notes from the MEDICAL RECORD NUMBER Referred by Dr. Gala Romney for B-scan LEE:  Ocular Hx- PMH-   CURRENT MEDICATIONS: Current Outpatient Medications (Ophthalmic Drugs)  Medication Sig  . Hypromellose (ARTIFICIAL TEARS OP) Place 1 drop into both eyes daily as needed (for  dryness).   . timolol (TIMOPTIC) 0.5 % ophthalmic solution Place 1 drop into both eyes 2 (two) times daily.   . Travoprost, BAK Free, (TRAVATAN Z) 0.004 % SOLN ophthalmic solution Place 1 drop into both eyes at bedtime.  . brinzolamide (AZOPT) 1 % ophthalmic suspension Place 1 drop into the right eye 2 (two) times daily.    No current facility-administered medications for this visit. (Ophthalmic Drugs)   Current Outpatient Medications (Other)  Medication Sig  . acetaminophen (TYLENOL) 500 MG tablet Take 1,000 mg by mouth every 6 (six) hours as needed for mild pain or headache.  Marland Kitchen atenolol (TENORMIN) 50 MG tablet Take 1 tablet (50 mg total) by mouth daily.  Marland Kitchen azelastine (ASTELIN) 0.1 % nasal spray Place 2 sprays into both nostrils at bedtime as needed for rhinitis or allergies. (Patient taking differently: Place 1 spray into both nostrils at bedtime. )  . calcium carbonate (TUMS - DOSED IN MG ELEMENTAL CALCIUM) 500 MG chewable tablet Chew 1-2 tablets by mouth as needed for indigestion or heartburn.  . diphenhydramine-acetaminophen (TYLENOL PM) 25-500 MG TABS Take 1 tablet by mouth at bedtime as needed (for sleep).   . docusate sodium (COLACE) 100 MG capsule Take 100 mg by mouth daily as needed for  mild constipation.  . fluocinonide (LIDEX) 0.05 % external solution Apply 1 application topically See admin instructions. Apply to the scalp as directed at bedtime  . fluticasone (FLONASE) 50 MCG/ACT nasal spray Place 2 sprays into both nostrils daily as needed for allergies or rhinitis.  . folic acid (FOLVITE) A999333 MCG tablet Take 400 mcg by mouth at bedtime.   . Multiple Vitamins-Minerals (MULTIVITAMIN,TX-MINERALS) tablet Take 1 tablet by mouth at bedtime.   . NON FORMULARY Take 1-2 capsules by mouth See admin instructions. Moringa capsules: Take 1 capsule by mouth once a day, alternating with 2 capsules every other day  . ondansetron (ZOFRAN) 4 MG tablet Take 1 tablet (4 mg total) by mouth every 8  (eight) hours as needed for nausea or vomiting.  Marland Kitchen oxyCODONE (ROXICODONE) 5 MG immediate release tablet Take 1 tablet (5 mg total) by mouth every 6 (six) hours as needed for severe pain.  . polyethylene glycol (MIRALAX / GLYCOLAX) packet Take 17 g by mouth daily. [place]  . pravastatin (PRAVACHOL) 40 MG tablet Take 1 tablet (40 mg total) by mouth at bedtime.  Marland Kitchen aspirin EC 81 MG tablet Take 81 mg by mouth daily.   No current facility-administered medications for this visit. (Other)      REVIEW OF SYSTEMS: ROS    Positive for: Gastrointestinal, Musculoskeletal, Cardiovascular, Eyes   Negative for: Constitutional, Neurological, Skin, Genitourinary, HENT, Endocrine, Respiratory, Psychiatric, Allergic/Imm, Heme/Lymph   Last edited by Matthew Folks, COA on 08/24/2019 10:01 AM. (History)       ALLERGIES Allergies  Allergen Reactions  . Nitrofuran Derivatives Other (See Comments)    Caused headaches   . Sulfonamide Derivatives Nausea And Vomiting    PAST MEDICAL HISTORY Past Medical History:  Diagnosis Date  . Constipation   . DVT (deep venous thrombosis) ([place])    hx of 2001 (after prostate surgery)  . Hyperlipidemia   . Hypertension   . Mitral valve prolapse   . PE (pulmonary embolism)    5-09:was referred to hematology, and they recommend to discontinue Coumadin 5/11  . Prostate cancer [facility])     released from urology in 2010,needs yearly PSAs with PCP  . Seasonal allergies    Past Surgical History:  Procedure Laterality Date  . CATARACT EXTRACTION Bilateral   . COLONOSCOPY    . EYE SURGERY Bilateral    Cat Sx  . PROSTATECTOMY  2000  . RUPTURED GLOBE EXPLORATION AND REPAIR Left 08/16/2019   Procedure: OPEN GLOBE EXPLORATION EYE POSSIBLE ANTERIOR  [facility] OUT;  Surgeon: Lonia Skinner, MD;  Location: [facility];  Service: Ophthalmology;  Laterality: Left;    FAMILY HISTORY Family History  Problem Relation Age of Onset  . CAD Father 48  . Hypertension Father    . Glaucoma Mother   . Diabetes Neg Hx   . Colon cancer Neg Hx   . Prostate cancer Neg Hx     SOCIAL HISTORY Social History   Tobacco Use  . Smoking status: Never Smoker  . Smokeless tobacco: Never Used  Substance Use Topics  . Alcohol use: No  . Drug use: No         OPHTHALMIC EXAM:  Base Eye Exam    Visual Acuity (Snellen - Linear)      Right Left   Dist cc 20/30 -2 HM   Dist ph cc  NI   Correction: Glasses       Tonometry (Tonopen, 10:07 AM)  Right Left   Pressure 10 10       Pupils      Dark Light Shape React APD   Right 3 2 Round Minimal None   Left 4 3.5 Irregular Minimal None       Visual Fields (Counting fingers)      Left Right     Full   Restrictions Total superior temporal, inferior temporal, superior nasal, inferior nasal deficiencies        Extraocular Movement      Right Left    Full, Ortho Abnormal, Ortho    -- -- --  --  --  -- -- --   -2 -2 -2  -2  -2  -2 -3 -2         Neuro/Psych    Oriented x3: Yes   Mood/Affect: Normal       Dilation    Both eyes: 1.0% Mydriacyl, 2.5% Phenylephrine @ 10:07 AM        Slit Lamp and Fundus Exam    Slit Lamp Exam      Right Left   Lids/Lashes Dermatochalasis - upper lid, Meibomian gland dysfunction Large scab over lateral UL   Conjunctiva/Sclera White and quiet mild [place]; sutures intact   Cornea Arcus, central haze/crocodile shegreen  Arcus, central haze/crocodile shagreen    Anterior Chamber Deep and quiet Deep, blood stained vitreous prolapse temporally, fibrin reaction overlying IOL, 3-4+ cell/flare   Iris Round and dilated Round and dilated   Lens PC IOL in good position PC IOL in good position   Vitreous Vitreous syneresis Vitreous syneresis, white VH v fibrin in ant vitreous       Fundus Exam      Right Left   Disc Pink and Sharp no view   C/D Ratio 0.6    Macula Flat, Blunted foveal reflex, mild RPE mottling and clumping, No heme or edema no view   Vessels Vascular  attenuation, Tortuous no view   Periphery Attached, mild reticular degeneration no view        Refraction    Wearing Rx      Sphere Cylinder Axis Add   Right -1.75 +1.00 167 +2.50   Left -1.50 +2.25 177 +2.50   Age: 80m   Type: Bifocal       Manifest Refraction      Sphere Cylinder Axis Dist VA   Right -1.75 +1.00 167 20/30-2   Left -1.50 +2.25 177 HM          IMAGING AND PROCEDURES  Imaging and Procedures for @TODAY @  OCT, Retina - OU - Both Eyes       Right Eye Quality was good. Central Foveal Thickness: 312. Progression has no prior data. Findings include normal foveal contour, no IRF, no SRF.   Notes *Images captured and stored on drive  Diagnosis / Impression:  OD: NFP, no IRF/SRF OS: no image  Clinical management:  See below  Abbreviations: NFP - Normal foveal profile. CME - cystoid macular edema. PED - pigment epithelial detachment. IRF - intraretinal fluid. SRF - subretinal fluid. EZ - ellipsoid zone. ERM - epiretinal membrane. ORA - outer retinal atrophy. ORT - outer retinal tubulation. SRHM - subretinal hyper-reflective material        B-Scan Ultrasound - OS - Left Eye       Quality was good. Findings included posterior vitreous detachment, vitreous hemorrhage, vitreous opacities.   Notes **Images stored on drive**  Impression: OS: vitreous opacities consistent with hemorrhage; thickened  hyperechoic PVD; no obvious RT/RD or mass                 ASSESSMENT/PLAN:    ICD-10-CM   1. Left eye injury, sequela  S05.92XS   2. Hyphema of left eye  H21.02 B-Scan Ultrasound - OS - Left Eye  3. Vitreous prolapse of left eye  H43.02   4. Vitreous hemorrhage of left eye (HCC)  H43.12 B-Scan Ultrasound - OS - Left Eye  5. Closed fracture of orbit, sequela ([place])  S02.85XS   6. Retinal edema  H35.81 OCT, Retina - OU - Both Eyes  7. Primary open angle glaucoma of both eyes, unspecified glaucoma stage  H40.1130   8. Pseudophakia of both eyes  Z96.1      1-6. Eye trauma with orbital fractures, hyphema, vitreous prolapse and vitreous hemorrhage OS  - mechanism of injury -- fall on 4.8.21  - under the expert management of Dr. Eulas Post  - underwent exploration with Dr. Eulas Post on 4.15.21  - no obvious globe rupture  - initially had elevated IOPs in the setting of history of POAG managed by Dr. Jerline Pain  - IOP stably improved today  - referred here for bscan and retina eval  - b-scan shows thick hyperechoic signal within anterior vitreous cavity -- consistent with vitreous heme; no obvious RT/RD  - VH precautions reviewed -- minimize activities, keep head elevated, avoid ASA/NSAIDs/blood thinners as able  7. History of POAG  8. Pseudophakia OU  - s/p CE/IOL OU  - IOLs in good position   Ophthalmic Meds Ordered this visit:  No orders of the defined types were placed in this encounter.      Return for f/u next Thursday/Friday, eye trauma/VH OD, DFE, OCT, B scan.  There are no Patient Instructions on file for this visit.   Explained the diagnoses, plan, and follow up with the patient and they expressed understanding.  Patient expressed understanding of the importance of proper follow up care.   This document serves as a record of services personally performed by Gardiner Sleeper, MD, PhD. It was created on their behalf by Ernest Mallick, OA, an ophthalmic assistant. The creation of this record is the provider's dictation and/or activities during the visit.    Electronically signed by: Ernest Mallick, OA 04.23.2021 1:07 AM   Gardiner Sleeper, M.D., Ph.D. Diseases & Surgery of the Retina and Vitreous [facility]  I have reviewed the above documentation for accuracy and completeness, and I agree with the above. Gardiner Sleeper, M.D., Ph.D. 08/26/19 1:07 AM   Abbreviations: M myopia (nearsighted); A astigmatism; H hyperopia (farsighted); P presbyopia; Mrx spectacle prescription;  CTL contact lenses; OD right eye; OS  left eye; OU both eyes  XT exotropia; ET esotropia; PEK punctate epithelial keratitis; PEE punctate epithelial erosions; DES dry eye syndrome; MGD meibomian gland dysfunction; ATs artificial tears; PFAT's preservative free artificial tears; [place] nuclear sclerotic cataract; PSC posterior subcapsular cataract; ERM epi-retinal membrane; PVD posterior vitreous detachment; RD retinal detachment; DM diabetes mellitus; DR diabetic retinopathy; NPDR non-proliferative diabetic retinopathy; PDR proliferative diabetic retinopathy; CSME clinically significant macular edema; DME diabetic macular edema; dbh dot blot hemorrhages; CWS cotton wool spot; POAG primary open angle glaucoma; C/D cup-to-disc ratio; HVF humphrey visual field; GVF goldmann visual field; OCT optical coherence tomography; IOP intraocular pressure; BRVO Branch retinal vein occlusion; CRVO central retinal vein occlusion; CRAO central retinal artery occlusion; BRAO branch retinal artery occlusion; RT retinal tear; SB scleral buckle;  PPV pars plana vitrectomy; VH Vitreous hemorrhage; PRP panretinal laser photocoagulation; IVK intravitreal kenalog; VMT vitreomacular traction; MH Macular hole;  NVD neovascularization of the disc; NVE neovascularization elsewhere; AREDS age related eye disease study; ARMD age related macular degeneration; POAG primary open angle glaucoma; EBMD epithelial/anterior basement membrane dystrophy; ACIOL anterior chamber intraocular lens; IOL intraocular lens; PCIOL posterior chamber intraocular lens; Phaco/IOL phacoemulsification with intraocular lens placement; [place] photorefractive keratectomy; LASIK laser assisted in situ keratomileusis; HTN hypertension; DM diabetes mellitus; COPD chronic obstructive pulmonary disease

## 2019-08-27 ENCOUNTER — Telehealth: Payer: Self-pay

## 2019-08-27 ENCOUNTER — Encounter: Payer: Self-pay | Admitting: Plastic Surgery

## 2019-08-27 ENCOUNTER — Ambulatory Visit: Payer: PPO | Admitting: Surgical

## 2019-08-27 ENCOUNTER — Encounter: Payer: Self-pay | Admitting: Internal Medicine

## 2019-08-27 ENCOUNTER — Ambulatory Visit (HOSPITAL_BASED_OUTPATIENT_CLINIC_OR_DEPARTMENT_OTHER)
Admission: RE | Admit: 2019-08-27 | Discharge: 2019-08-27 | Disposition: A | Discharge status: Home / Self Care | Payer: PPO | Source: Ambulatory Visit | Attending: Internal Medicine | Admitting: Internal Medicine

## 2019-08-27 ENCOUNTER — Other Ambulatory Visit: Payer: Self-pay

## 2019-08-27 ENCOUNTER — Ambulatory Visit (INDEPENDENT_AMBULATORY_CARE_PROVIDER_SITE_OTHER): Payer: PPO | Admitting: Internal Medicine

## 2019-08-27 VITALS — BP 124/51 | HR 77 | Temp 96.9°F | Resp 18 | Ht 72.0 in | Wt 153.0 lb

## 2019-08-27 VITALS — BP 116/69 | HR 74 | Temp 97.5°F | Wt 153.0 lb

## 2019-08-27 DIAGNOSIS — S20212D Contusion of left front wall of thorax, subsequent encounter: Secondary | ICD-10-CM

## 2019-08-27 DIAGNOSIS — W19XXXD Unspecified fall, subsequent encounter: Secondary | ICD-10-CM | POA: Diagnosis not present

## 2019-08-27 DIAGNOSIS — S0191XA Laceration without foreign body of unspecified part of head, initial encounter: Secondary | ICD-10-CM | POA: Diagnosis not present

## 2019-08-27 DIAGNOSIS — R079 Chest pain, unspecified: Secondary | ICD-10-CM | POA: Diagnosis not present

## 2019-08-27 DIAGNOSIS — R918 Other nonspecific abnormal finding of lung field: Secondary | ICD-10-CM | POA: Diagnosis not present

## 2019-08-27 DIAGNOSIS — S02849S Fracture of lateral orbital wall, unspecified side, sequela: Secondary | ICD-10-CM | POA: Diagnosis not present

## 2019-08-27 DIAGNOSIS — M6281 Muscle weakness (generalized): Secondary | ICD-10-CM | POA: Diagnosis not present

## 2019-08-27 DIAGNOSIS — S02842D Fracture of lateral orbital wall, left side, subsequent encounter for fracture with routine healing: Secondary | ICD-10-CM | POA: Diagnosis not present

## 2019-08-27 MED ORDER — TRAMADOL HCL 50 MG PO TABS: 50.0000 mg | ORAL_TABLET | Freq: Two times a day (BID) | ORAL | 0 refills | Status: DC | PRN

## 2019-08-27 NOTE — Progress Notes (Signed)
Subjective:    Patient ID: Brandon Burke, male    DOB: 01/25/1932, 86 y.o.   MRN: [id number]  DOS:  08/27/2019 Type of visit - description: Acute, here with his wife. His main concern today is chest pain, started to 3 days ago, is localized at the left anterior and lateral chest. Is typically triggered by any movement or coughing. Decreased with Tylenol but only for 3 hours, then it comes back. Also taking ibuprofen.  He recently had a major fall and left facial injury however since then denies any new falls.   Wt Readings from Last 3 Encounters:  08/27/19 153 lb (69.4 kg)  08/27/19 153 lb (69.4 kg)  08/16/19 155 lb (70.3 kg)     Review of Systems Denies neck pain or left shoulder pain No fever chills No difficulty breathing, edema or palpitations No upper or lower extremity paresthesias No cough or chest congestion  Past Medical History:  Diagnosis Date  . Constipation   . DVT (deep venous thrombosis) ([place])    hx of 2001 (after prostate surgery)  . Hyperlipidemia   . Hypertension   . Mitral valve prolapse   . PE (pulmonary embolism)    5-09:was referred to hematology, and they recommend to discontinue Coumadin 5/11  . Prostate cancer [facility])     released from urology in 2010,needs yearly PSAs with PCP  . Seasonal allergies     Past Surgical History:  Procedure Laterality Date  . CATARACT EXTRACTION Bilateral   . COLONOSCOPY    . EYE SURGERY Bilateral    Cat Sx  . PROSTATECTOMY  2000  . RUPTURED GLOBE EXPLORATION AND REPAIR Left 08/16/2019   Procedure: OPEN GLOBE EXPLORATION EYE POSSIBLE ANTERIOR  [facility] [place] OUT;  Surgeon: Lonia Skinner, MD;  Location: [facility];  Service: Ophthalmology;  Laterality: Left;    Allergies as of 08/27/2019      Reactions   Nitrofuran Derivatives Other (See Comments)   Caused headaches   Sulfonamide Derivatives Nausea And Vomiting      Medication List       Accurate as of August 27, 2019 11:59 PM. If you have any  questions, ask your nurse or doctor.        STOP taking these medications   brinzolamide 1 % ophthalmic suspension Commonly known as: AZOPT Stopped by: [name], MD   oxyCODONE 5 MG immediate release tablet Commonly known as: Roxicodone Stopped by: [name], MD     TAKE these medications   acetaminophen 500 MG tablet Commonly known as: TYLENOL Take 1,000 mg by mouth every 6 (six) hours as needed for mild pain or headache.   ARTIFICIAL TEARS OP Place 1 drop into both eyes daily as needed (for dryness).   aspirin EC 81 MG tablet Take 81 mg by mouth daily.   atenolol 50 MG tablet Commonly known as: TENORMIN Take 1 tablet (50 mg total) by mouth daily.   azelastine 0.1 % nasal spray Commonly known as: ASTELIN Place 2 sprays into both nostrils at bedtime as needed for rhinitis or allergies. What changed:   how much to take  when to take this   calcium carbonate 500 MG chewable tablet Commonly known as: TUMS - dosed in mg elemental calcium Chew 1-2 tablets by mouth as needed for indigestion or heartburn.   diphenhydramine-acetaminophen 25-500 MG Tabs tablet Commonly known as: TYLENOL PM Take 1 tablet by mouth at bedtime as needed (for sleep).   docusate sodium 100 MG capsule  Commonly known as: COLACE Take 100 mg by mouth daily as needed for mild constipation.   fluocinonide 0.05 % external solution Commonly known as: LIDEX Apply 1 application topically See admin instructions. Apply to the scalp as directed at bedtime   fluticasone 50 MCG/ACT nasal spray Commonly known as: FLONASE Place 2 sprays into both nostrils daily as needed for allergies or rhinitis.   folic acid A999333 MCG tablet Commonly known as: FOLVITE Take 400 mcg by mouth at bedtime.   multivitamin,tx-minerals tablet Take 1 tablet by mouth at bedtime.   NON FORMULARY Take 1-2 capsules by mouth See admin instructions. Moringa capsules: Take 1 capsule by mouth once a day, alternating with 2 capsules  every other day   ondansetron 4 MG tablet Commonly known as: Zofran Take 1 tablet (4 mg total) by mouth every 8 (eight) hours as needed for nausea or vomiting.   polyethylene glycol 17 g packet Commonly known as: MIRALAX / GLYCOLAX Take 17 g by mouth daily. MIX AND DRINK   pravastatin 40 MG tablet Commonly known as: PRAVACHOL Take 1 tablet (40 mg total) by mouth at bedtime.   timolol 0.5 % ophthalmic solution Commonly known as: TIMOPTIC Place 1 drop into both eyes 2 (two) times daily.   traMADol 50 MG tablet Commonly known as: Ultram Take 1 tablet (50 mg total) by mouth 2 (two) times daily as needed. Started by: [name], MD   Travatan Z 0.004 % Soln ophthalmic solution Generic drug: Travoprost (BAK Free) Place 1 drop into both eyes at bedtime.          Objective:   Physical Exam BP (!) 124/51 (BP Location: Left Arm, Patient Position: Sitting, Cuff Size: Small)   Pulse 77   Temp (!) 96.9 F (36.1 C) (Temporal)   Resp 18   Ht 6' (1.829 m)   Wt 153 lb (69.4 kg)   SpO2 99%   BMI 20.75 kg/m  General:   Well developed, NAD, BMI noted. HEENT:  Normocephalic . Face traumatic, seems much less swollen than before Lungs:  CTA B Normal respiratory effort, no intercostal retractions, no accessory muscle use. Chest wall: No ecchymosis, upon palpation the left chest wall is symmetric with no particular area of TTP Heart: RRR,  no murmur.  Lower extremities: no pretibial edema bilaterally  Skin: Not pale. Not jaundice Neurologic:  alert & oriented X3.  Speech normal, gait not tested, patient on a wheelchair. Psych--  Cognition and judgment appear intact.  Cooperative with normal attention span and concentration.  Behavior appropriate. No anxious or depressed appearing.      Assessment      Assessment Hyperlipidemia Prostate cancer, released from urology 2010, Rx yearly PSAs by PCP HEM:DVT 2001 after surgery, PE 2009, eval by hematology, they recommend DC  Coumadin 2011 H/o MVP-- on BB glaucoma Sees dermatology q 6 months   PLAN Facial fracture: Last visit with plastic surgery today, was rx  conservative treatment.  Next visit: 1 month He had open L eye exploratory surgery 08/16/2018. Chest pain: As described above in the context of recent major fall earlier this month.  Up until recently he had no chest pain but denies any new fall or injury. Suspected chest contusion. Plan: Chest x-ray Pain management: Decrease ibuprofen to only 1 or 2 OTCs daily Tylenol up to 3 g daily Stop  OxyContin due to drowsiness, try Ultram.  See AVS. Call if pain is not controlled or if is not getting gradually better.  This  visit occurred during the SARS-CoV-2 public health emergency.  Safety protocols were in place, including screening questions prior to the visit, additional usage of staff PPE, and extensive cleaning of exam room while observing appropriate contact time as indicated for disinfecting solutions.  

## 2019-08-27 NOTE — Telephone Encounter (Signed)
OT plan of care for [employer] signed and faxed back to [phone]. Form sent for scanning.

## 2019-08-27 NOTE — Progress Notes (Signed)
Pre visit review using our clinic review tool, if applicable. No additional management support is needed unless otherwise documented below in the visit note. 

## 2019-08-27 NOTE — Patient Instructions (Addendum)
Go down to the first floor and get your x-ray   For pain control: Tramadol twice a day If it make you too sleepy take it only at night or cut the dose to  only half tablet twice a day as well.  Tylenol  500 mg OTC 2 tabs a day every 8 hours as needed for pain  Ibuprofen: It may be toxic to your stomach, take 200 mg once or twice a day at most, always with food.  If the pain is not well controlled with this strategy please let me know

## 2019-08-27 NOTE — Progress Notes (Signed)
Subjective:     Patient ID: Brandon Burke, male    DOB: 1932-01-03, 86 y.o.   MRN: [id number]  Chief Complaint  Patient presents with  . Follow-up    HPI: The patient is a 86 y.o. male here for follow-up on facial fractures and facial lacerations sustained on 08/09/2019.  Patient was initially evaluated in the ED and CT scan showed comminuted displaced blowout fracture involving medial and inferior orbital walls of his left orbital space.  At that time patient elected for conservative treatment in regards to fracture repair.  He has been seeing ophthalmology for management of left eye vision changes, hyphema OS.  Patient underwent globe exploration with anterior chamber washout of his left eye on 08/16/2019 by Dr. Gala Romney.  Per recent ophthalmology visit patient with OS IOP stable and improving.  He is here today for further evaluation of left eye swelling and to determine treatment plan for fractures.  Patient reports that he has had decreased appetite and has lost some weight since his injury, but has been doing protein shakes. He is scheduled to see Dr. Caryl Comes, ophthalmologist today for further evaluation of left eye.  He reports he does not have any double vision, but he does report only being able to see shadows and light out of his left eye.  He also reports decreased smell. His exam is limited due to inconsistent feedback.  Review of Systems  Eyes: Positive for redness and visual disturbance. Negative for pain, discharge and itching.       Negative double vision.    Skin: Positive for color change.     Objective:   Vital Signs BP 116/69 (BP Location: Left Arm, Patient Position: Sitting, Cuff Size: Normal)   Pulse 74   Temp (!) 97.5 F (36.4 C) (Temporal)   Wt 153 lb (69.4 kg)   SpO2 95%   BMI 20.75 kg/m  Vital Signs and Nursing Note Reviewed Physical Exam  Constitutional: He is oriented to person, place, and time and well-developed, well-nourished, and in no  distress. No distress.  Eyes: Left eye exhibits no chemosis, no discharge and no exudate. No foreign body present in the left eye. Left conjunctiva is not injected. Left conjunctiva has a hemorrhage (Mild). Left eye exhibits abnormal extraocular motion. Left pupil is not reactive. Pupils are unequal.    Haze noted of left eye, round and dilated pupil.  Mild hemorrhage.  Poor visual acuity of left eye, unable to identify fingers at a few feet or few inches, except for one occurrence.     Pulmonary/Chest: Effort normal.  Neurological: He is alert and oriented to person, place, and time.  Patient in wheelchair  Skin: Skin is warm and dry. He is not diaphoretic.  Psychiatric: Mood and affect normal.    Assessment/Plan:     ICD-10-CM   1. Complex laceration of face, initial encounter  S01.91XA    Bruising and swelling of left eye/face has significantly improved. He does not have any double vision, which is reassuring. He does have poor visual acuity of left eye. We will continue to manage fractures conservatively and allow ophthalmology to continue to manage visual symptoms. If patient develops diplopia or muscle entrapment, will require fixation of orbital fractures.   Patient has inconsistent feedback in regards to left eye visual acuity, but is planning to see ophthalmologist today for evaluation after his procedure on 08/16/19  No surgical management planned at time. Patient and wife would like to avoid surgery if  possible, this will be fine as long as he does not develop symptoms of entrapment.  Follow up in 1 month, Call with questions/concerns/new symptoms.  Pictures were obtained of the patient and placed in the chart with the patient's or guardian's permission.   [name], PA-C 08/27/2019, 10:00 AM

## 2019-08-28 NOTE — Assessment & Plan Note (Signed)
Facial fracture: Last visit with plastic surgery today, was rx  conservative treatment.  Next visit: 1 month He had open L eye exploratory surgery 08/16/2018. Chest pain: As described above in the context of recent major fall earlier this month.  Up until recently he had no chest pain but denies any new fall or injury. Suspected chest contusion. Plan: Chest x-ray Pain management: Decrease ibuprofen to only 1 or 2 OTCs daily Tylenol up to 3 g daily Stop  OxyContin due to drowsiness, try Ultram.  See AVS. Call if pain is not controlled or if is not getting gradually better.

## 2019-08-28 NOTE — Progress Notes (Signed)
[facility] Note  08/31/2019     CHIEF COMPLAINT Patient presents for Eye Injury   HISTORY OF PRESENT ILLNESS: Brandon Burke is a 86 y.o. male who presents to the clinic today for:   HPI    Eye Injury    In left eye.  Type of trauma is direct.  Duration of weeks.  Associated signs and symptoms include eye pain and vision loss.  I, the attending physician,  performed the HPI with the patient and updated documentation appropriately.          Comments    Pt states he still has some mild pain OS but not as bad as it was after initial injury.  Patient states vision is not improving OS.  Patient denies any new or worsening floaters or fol OU and denies any change in [place] OD.       Last edited by [name], MD on 08/31/2019  1:33 PM. (History)    pt saw Dr. Eulas Post on Monday, pt states he did not change any drops, pt feels like vision has not changed since last visit, pt sleeps with his head elevated, pt has occasional pain OS   Referring physician: Lonia Skinner, MD [place],  [place] [id number]  HISTORICAL INFORMATION:   Selected notes from the MEDICAL RECORD NUMBER Referred by Dr. Gala Romney for B-scan LEE:  Ocular Hx- PMH-   CURRENT MEDICATIONS: Current Outpatient Medications (Ophthalmic Drugs)  Medication Sig  . Hypromellose (ARTIFICIAL TEARS OP) Place 1 drop into both eyes 3 (three) times daily as needed (for dryness).   Marland Kitchen ofloxacin (OCUFLOX) 0.3 % ophthalmic solution Place 1 drop into the left eye 4 (four) times daily.  . prednisoLONE acetate (PRED FORTE) 1 % ophthalmic suspension Place 1 drop into the left eye 4 (four) times daily.  . timolol (TIMOPTIC) 0.5 % ophthalmic solution Place 1 drop into both eyes 2 (two) times daily.   . Travoprost, BAK Free, (TRAVATAN Z) 0.004 % SOLN ophthalmic solution Place 1 drop into both eyes at bedtime.   No current facility-administered medications for this visit.  (Ophthalmic Drugs)   Current Outpatient Medications (Other)  Medication Sig  . acetaminophen (TYLENOL) 500 MG tablet Take 1,000 mg by mouth every 6 (six) hours as needed for mild pain or headache.  Marland Kitchen aspirin EC 81 MG tablet Take 81 mg by mouth daily.  Marland Kitchen atenolol (TENORMIN) 50 MG tablet Take 1 tablet (50 mg total) by mouth daily.  Marland Kitchen azelastine (ASTELIN) 0.1 % nasal spray Place 2 sprays into both nostrils at bedtime as needed for rhinitis or allergies. (Patient taking differently: Place 1 spray into both nostrils at bedtime. )  . calcium carbonate (TUMS - DOSED IN MG ELEMENTAL CALCIUM) 500 MG chewable tablet Chew 1-2 tablets by mouth as needed for indigestion or heartburn.  . diphenhydrAMINE (BENADRYL) 25 MG tablet Take 25 mg by mouth daily as needed for allergies.  . diphenhydramine-acetaminophen (TYLENOL PM) 25-500 MG TABS Take 1 tablet by mouth at bedtime as needed (for sleep).   . docusate sodium (COLACE) 100 MG capsule Take 100 mg by mouth daily as needed for mild constipation.  . fluocinonide (LIDEX) 0.05 % external solution Apply 1 application topically See admin instructions. Apply to the scalp as directed at bedtime  . fluticasone (FLONASE) 50 MCG/ACT nasal spray Place 2 sprays into both nostrils daily as needed for allergies or rhinitis.  . folic acid (FOLVITE)  400 MCG tablet Take 400 mcg by mouth at bedtime.   . Multiple Vitamins-Minerals (MULTIVITAMIN,TX-MINERALS) tablet Take 1 tablet by mouth at bedtime.   . NEOMYCIN-POLYMYXIN EX Place 1 application into both eyes in the morning, at noon, and at bedtime.  . NON FORMULARY Take 1-2 capsules by mouth See admin instructions. [place] capsules: Take 1 capsule by mouth once a day, alternating with 2 capsules every other day  . ondansetron (ZOFRAN) 4 MG tablet Take 1 tablet (4 mg total) by mouth every 8 (eight) hours as needed for nausea or vomiting.  . polyethylene glycol (MIRALAX / GLYCOLAX) packet Take 17 g by mouth daily. [place]  .  pravastatin (PRAVACHOL) 40 MG tablet Take 1 tablet (40 mg total) by mouth at bedtime.  . Probiotic Product (ALIGN) 4 MG CAPS Take 4 mg by mouth daily.  . traMADol (ULTRAM) 50 MG tablet Take 1 tablet (50 mg total) by mouth 2 (two) times daily as needed.   No current facility-administered medications for this visit. (Other)      REVIEW OF SYSTEMS: ROS    Positive for: Gastrointestinal, Musculoskeletal, Cardiovascular, Eyes   Negative for: Constitutional, Neurological, Skin, Genitourinary, HENT, Endocrine, Respiratory, Psychiatric, Allergic/Imm, Heme/Lymph   Last edited by Doneen Poisson on 08/31/2019  1:08 PM. (History)       ALLERGIES Allergies  Allergen Reactions  . Nitrofuran Derivatives Other (See Comments)    Caused headaches   . Sulfonamide Derivatives Nausea And Vomiting    PAST MEDICAL HISTORY Past Medical History:  Diagnosis Date  . Constipation   . DVT (deep venous thrombosis) ([place])    hx of 2001 (after prostate surgery)  . Hyperlipidemia   . Hypertension   . Mitral valve prolapse   . PE (pulmonary embolism)    5-09:was referred to hematology, and they recommend to discontinue Coumadin 5/11  . Prostate cancer [facility])     released from urology in 2010,needs yearly PSAs with PCP  . Seasonal allergies    Past Surgical History:  Procedure Laterality Date  . CATARACT EXTRACTION Bilateral   . COLONOSCOPY    . EYE SURGERY Bilateral    Cat Sx  . PROSTATECTOMY  2000  . RUPTURED GLOBE EXPLORATION AND REPAIR Left 08/16/2019   Procedure: OPEN GLOBE EXPLORATION EYE POSSIBLE ANTERIOR  CHAMBER [place] OUT;  Surgeon: Lonia Skinner, MD;  Location: [facility];  Service: Ophthalmology;  Laterality: Left;    FAMILY HISTORY Family History  Problem Relation Age of Onset  . CAD Father 52  . Hypertension Father   . Glaucoma Mother   . Diabetes Neg Hx   . Colon cancer Neg Hx   . Prostate cancer Neg Hx     SOCIAL HISTORY Social History   Tobacco Use  . Smoking status: Never  Smoker  . Smokeless tobacco: Never Used  Substance Use Topics  . Alcohol use: No  . Drug use: No         OPHTHALMIC EXAM:  Base Eye Exam    Visual Acuity (Snellen - Linear)      Right Left   Dist cc 20/25 HM   Dist ph cc NI NI   Correction: Glasses       Tonometry (Tonopen, 1:15 PM)      Right Left   Pressure 15 15       Pupils      Dark Light Shape React APD   Right 3 2 Round Minimal 0   Left 7 7 Irregular  dilated 0       Visual Fields      Left Right     Full   Restrictions Total superior temporal, inferior temporal, superior nasal, inferior nasal deficiencies        Extraocular Movement      Right Left    Full Abnormal    0 0 0  0  0  0 0 0   -3 -3 -3  -3  -3  -3 -3 -3         Neuro/Psych    Oriented x3: Yes   Mood/Affect: Normal       Dilation    Left eye: 1.0% Mydriacyl, 2.5% Phenylephrine @ 1:16 PM        Slit Lamp and Fundus Exam    Slit Lamp Exam      Right Left   Lids/Lashes Dermatochalasis - upper lid, Meibomian gland dysfunction Ptosis, ointment on lids   Conjunctiva/Sclera White and quiet mild [place]; sutures intact   Cornea Arcus, central haze/crocodile shegreen  Arcus, central haze/crocodile shagreen    Anterior Chamber Deep and quiet Deep, blood stained vitreous prolapse temporally, fibrin reaction overlying IOL, 3-4+ cell/flare, irregular epi/tear film   Iris Round and dilated Round and dilated   Lens PC IOL in good position PC IOL in good position   Vitreous Vitreous syneresis Vitreous syneresis, white VH v fibrin in ant vitreous       Fundus Exam      Right Left   Disc Pink and Sharp no view   C/D Ratio 0.6    Macula Flat, Blunted foveal reflex, mild RPE mottling and clumping, No heme or edema no view   Vessels Vascular attenuation, Tortuous no view   Periphery Attached, mild reticular degeneration no view        Refraction    Wearing Rx      Sphere Cylinder Axis Add   Right -1.75 +1.00 167 +2.50   Left -1.50 +2.25  177 +2.50   Type: Bifocal          IMAGING AND PROCEDURES  Imaging and Procedures for @TODAY @  B-Scan Ultrasound - OS - Left Eye       Quality was good. Findings included posterior vitreous detachment, vitreous hemorrhage, vitreous opacities.   Notes **Images stored on drive**  Impression: OS: vitreous opacities consistent with hemorrhage; thickened hyperechoic PVD; no obvious RT/RD or mass -- stable from prior                 ASSESSMENT/PLAN:    ICD-10-CM   1. Left eye injury, sequela  S05.92XS B-Scan Ultrasound - OS - Left Eye  2. Hyphema of left eye  H21.02   3. Vitreous prolapse of left eye  H43.02   4. Vitreous hemorrhage of left eye (HCC)  H43.12 B-Scan Ultrasound - OS - Left Eye  5. Closed fracture of orbit, sequela ([place])  S02.85XS   6. Retinal edema  H35.81 CANCELED: OCT, Retina - OU - Both Eyes  7. Primary open angle glaucoma of both eyes, unspecified glaucoma stage  H40.1130   8. Pseudophakia of both eyes  Z96.1     1-6. Eye trauma with orbital fractures, hyphema, vitreous prolapse and vitreous hemorrhage OS  - mechanism of injury -- fall on 04.08.21  - under the expert management of Dr. Eulas Post  - underwent exploration with Dr. Eulas Post on 4.15.21  - no obvious globe rupture  - initially had elevated IOPs in the setting of history of  POAG managed by Dr. Jerline Pain  - IOP stably improved today  - significant debris and old heme persists in the [facility] and vitreous cavity  - dicussed findings, guarded prognosis and treatment options including exploratory PPV  - pt and family wish to proceed with PPV to know the eye's true potential and to try to do everything possible to try to improve OS and its vision  - pt wishes to proceed with surgery  - RBA of procedure discussed, questions answered  - specifically discussed the possibility of air, gas, silicon oil in eye OS post op  - informed consent obtained and signed  - surgery scheduled for Sep 06, 2019, 2PM. [facility] OR 8 --  25g PPV w/ possible intravitreal injection of antibiotics  7. History of POAG  8. Pseudophakia OU  - s/p CE/IOL OU  - IOLs in good position   Ophthalmic Meds Ordered this visit:  No orders of the defined types were placed in this encounter.      Return in about 1 week (around 09/07/2019) for POV.  There are no Patient Instructions on file for this visit.   Explained the diagnoses, plan, and follow up with the patient and they expressed understanding.  Patient expressed understanding of the importance of proper follow up care.   This document serves as a record of services personally performed by Gardiner Sleeper, MD, PhD. It was created on their behalf by Ernest Mallick, OA, an ophthalmic assistant. The creation of this record is the provider's dictation and/or activities during the visit.    Electronically signed by: Ernest Mallick, OA 04.23.2021 11:38 PM   Gardiner Sleeper, M.D., Ph.D. Diseases & Surgery of the Retina and Vitreous [facility]  I have reviewed the above documentation for accuracy and completeness, and I agree with the above. Gardiner Sleeper, M.D., Ph.D. 09/02/19 11:38 PM    Abbreviations: M myopia (nearsighted); A astigmatism; H hyperopia (farsighted); P presbyopia; Mrx spectacle prescription;  CTL contact lenses; OD right eye; OS left eye; OU both eyes  XT exotropia; ET esotropia; PEK punctate epithelial keratitis; PEE punctate epithelial erosions; DES dry eye syndrome; MGD meibomian gland dysfunction; ATs artificial tears; PFAT's preservative free artificial tears; [place] nuclear sclerotic cataract; PSC posterior subcapsular cataract; ERM epi-retinal membrane; PVD posterior vitreous detachment; RD retinal detachment; DM diabetes mellitus; DR diabetic retinopathy; NPDR non-proliferative diabetic retinopathy; PDR proliferative diabetic retinopathy; CSME clinically significant macular edema; DME diabetic macular edema; dbh dot blot hemorrhages; CWS  cotton wool spot; POAG primary open angle glaucoma; C/D cup-to-disc ratio; HVF humphrey visual field; GVF goldmann visual field; OCT optical coherence tomography; IOP intraocular pressure; BRVO Branch retinal vein occlusion; CRVO central retinal vein occlusion; CRAO central retinal artery occlusion; BRAO branch retinal artery occlusion; RT retinal tear; SB scleral buckle; PPV pars plana vitrectomy; VH Vitreous hemorrhage; PRP panretinal laser photocoagulation; IVK intravitreal kenalog; VMT vitreomacular traction; MH Macular hole;  NVD neovascularization of the disc; NVE neovascularization elsewhere; AREDS age related eye disease study; ARMD age related macular degeneration; POAG primary open angle glaucoma; EBMD epithelial/anterior basement membrane dystrophy; ACIOL anterior chamber intraocular lens; IOL intraocular lens; PCIOL posterior chamber intraocular lens; Phaco/IOL phacoemulsification with intraocular lens placement; [place] photorefractive keratectomy; LASIK laser assisted in situ keratomileusis; HTN hypertension; DM diabetes mellitus; COPD chronic obstructive pulmonary disease

## 2019-08-31 ENCOUNTER — Encounter (INDEPENDENT_AMBULATORY_CARE_PROVIDER_SITE_OTHER): Payer: Self-pay | Admitting: Ophthalmology

## 2019-08-31 ENCOUNTER — Ambulatory Visit (INDEPENDENT_AMBULATORY_CARE_PROVIDER_SITE_OTHER): Payer: PPO | Admitting: Ophthalmology

## 2019-08-31 DIAGNOSIS — S0285XS Fracture of orbit, unspecified, sequela: Secondary | ICD-10-CM

## 2019-08-31 DIAGNOSIS — H4302 Vitreous prolapse, left eye: Secondary | ICD-10-CM

## 2019-08-31 DIAGNOSIS — H401130 Primary open-angle glaucoma, bilateral, stage unspecified: Secondary | ICD-10-CM

## 2019-08-31 DIAGNOSIS — H4312 Vitreous hemorrhage, left eye: Secondary | ICD-10-CM | POA: Diagnosis not present

## 2019-08-31 DIAGNOSIS — H3581 Retinal edema: Secondary | ICD-10-CM

## 2019-08-31 DIAGNOSIS — S0592XS Unspecified injury of left eye and orbit, sequela: Secondary | ICD-10-CM

## 2019-08-31 DIAGNOSIS — H2102 Hyphema, left eye: Secondary | ICD-10-CM | POA: Diagnosis not present

## 2019-08-31 DIAGNOSIS — Z961 Presence of intraocular lens: Secondary | ICD-10-CM | POA: Diagnosis not present

## 2019-09-03 ENCOUNTER — Encounter: Payer: Self-pay | Admitting: Internal Medicine

## 2019-09-03 ENCOUNTER — Other Ambulatory Visit: Payer: Self-pay

## 2019-09-03 ENCOUNTER — Other Ambulatory Visit (HOSPITAL_COMMUNITY)
Admission: RE | Admit: 2019-09-03 | Discharge: 2019-09-03 | Disposition: A | Discharge status: Home / Self Care | Payer: PPO | Source: Ambulatory Visit | Attending: Ophthalmology | Admitting: Ophthalmology

## 2019-09-03 ENCOUNTER — Ambulatory Visit (INDEPENDENT_AMBULATORY_CARE_PROVIDER_SITE_OTHER): Payer: PPO | Admitting: Internal Medicine

## 2019-09-03 VITALS — Ht 72.0 in

## 2019-09-03 DIAGNOSIS — S20212D Contusion of left front wall of thorax, subsequent encounter: Secondary | ICD-10-CM

## 2019-09-03 DIAGNOSIS — W19XXXD Unspecified fall, subsequent encounter: Secondary | ICD-10-CM

## 2019-09-03 DIAGNOSIS — F419 Anxiety disorder, unspecified: Secondary | ICD-10-CM | POA: Diagnosis not present

## 2019-09-03 DIAGNOSIS — Z20822 Contact with and (suspected) exposure to covid-19: Secondary | ICD-10-CM | POA: Diagnosis not present

## 2019-09-03 DIAGNOSIS — Z01812 Encounter for preprocedural laboratory examination: Secondary | ICD-10-CM | POA: Diagnosis not present

## 2019-09-03 LAB — SARS CORONAVIRUS 2 (TAT 6-24 HRS): SARS Coronavirus 2: NEGATIVE

## 2019-09-03 NOTE — Progress Notes (Addendum)
[facility] Note  09/07/2019     CHIEF COMPLAINT Patient presents for Post-op Follow-up   HISTORY OF PRESENT ILLNESS: Brandon Burke is a 86 y.o. male who presents to the clinic today for:   HPI    Post-op Follow-up    In left eye.  Discomfort includes pain.  Vision is stable.  I, the attending physician,  performed the HPI with the patient and updated documentation appropriately.          Comments    POV1 s/p PPV w/ endolaser and intravitreal abx OS for VH and presumed endophthalmitis       Last edited by [name], MD on 09/10/2019 10:20 PM. (History)    pt states he is doing well this morning, he states he can see light now and figures moving, he states he is not in any pain  Referring physician: Colon Branch, MD [address] [place],  [place] [address]  HISTORICAL INFORMATION:   Selected notes from the MEDICAL RECORD NUMBER Referred by Dr. Gala Romney for B-scan LEE:  Ocular Hx- PMH-   CURRENT MEDICATIONS: Current Outpatient Medications (Ophthalmic Drugs)  Medication Sig  . Hypromellose (ARTIFICIAL TEARS OP) Place 1 drop into both eyes 3 (three) times daily as needed (for dryness).   Marland Kitchen ofloxacin (OCUFLOX) 0.3 % ophthalmic solution Place 1 drop into the left eye 4 (four) times daily.  . prednisoLONE acetate (PRED FORTE) 1 % ophthalmic suspension Place 1 drop into the left eye 4 (four) times daily.  . timolol (TIMOPTIC) 0.5 % ophthalmic solution Place 1 drop into both eyes 2 (two) times daily.   . Travoprost, BAK Free, (TRAVATAN Z) 0.004 % SOLN ophthalmic solution Place 1 drop into both eyes at bedtime.   No current facility-administered medications for this visit. (Ophthalmic Drugs)   Current Outpatient Medications (Other)  Medication Sig  . acetaminophen (TYLENOL) 500 MG tablet Take 1,000 mg by mouth every 6 (six) hours as needed for mild pain or headache.  Marland Kitchen aspirin EC 81 MG tablet Take 81 mg by mouth daily.  Marland Kitchen  atenolol (TENORMIN) 50 MG tablet Take 1 tablet (50 mg total) by mouth daily.  Marland Kitchen azelastine (ASTELIN) 0.1 % nasal spray Place 2 sprays into both nostrils at bedtime as needed for rhinitis or allergies. (Patient taking differently: Place 1 spray into both nostrils at bedtime. )  . calcium carbonate (TUMS - DOSED IN MG ELEMENTAL CALCIUM) 500 MG chewable tablet Chew 1-2 tablets by mouth as needed for indigestion or heartburn.  . diphenhydrAMINE (BENADRYL) 25 MG tablet Take 25 mg by mouth daily as needed for allergies.  . diphenhydramine-acetaminophen (TYLENOL PM) 25-500 MG TABS Take 1 tablet by mouth at bedtime as needed (for sleep).   . docusate sodium (COLACE) 100 MG capsule Take 100 mg by mouth daily as needed for mild constipation.  . fluocinonide (LIDEX) 0.05 % external solution Apply 1 application topically See admin instructions. Apply to the scalp as directed at bedtime  . fluticasone (FLONASE) 50 MCG/ACT nasal spray Place 2 sprays into both nostrils daily as needed for allergies or rhinitis.  . folic acid (FOLVITE) A999333 MCG tablet Take 400 mcg by mouth at bedtime.   . Multiple Vitamins-Minerals (MULTIVITAMIN,TX-MINERALS) tablet Take 1 tablet by mouth at bedtime.   . NEOMYCIN-POLYMYXIN EX Place 1 application into both eyes in the morning, at noon, and at bedtime.  . NON FORMULARY Take 1-2 capsules by mouth See admin  instructions. Moringa capsules: Take 1 capsule by mouth once a day, alternating with 2 capsules every other day  . ondansetron (ZOFRAN) 4 MG tablet Take 1 tablet (4 mg total) by mouth every 8 (eight) hours as needed for nausea or vomiting.  . polyethylene glycol (MIRALAX / GLYCOLAX) packet Take 17 g by mouth daily. [place]  . pravastatin (PRAVACHOL) 40 MG tablet Take 1 tablet (40 mg total) by mouth at bedtime.  . Probiotic Product (ALIGN) 4 MG CAPS Take 4 mg by mouth daily.  . traMADol (ULTRAM) 50 MG tablet Take 1 tablet (50 mg total) by mouth 2 (two) times daily as needed.    No current facility-administered medications for this visit. (Other)      REVIEW OF SYSTEMS: ROS    Positive for: Gastrointestinal, Musculoskeletal, Cardiovascular, Eyes   Negative for: Constitutional, Neurological, Skin, Genitourinary, HENT, Endocrine, Respiratory, Psychiatric, Allergic/Imm, Heme/Lymph   Last edited by Doneen Poisson on 09/07/2019  8:16 AM. (History)       ALLERGIES Allergies  Allergen Reactions  . Nitrofuran Derivatives Other (See Comments)    Caused headaches   . Sulfonamide Derivatives Nausea And Vomiting    PAST MEDICAL HISTORY Past Medical History:  Diagnosis Date  . Constipation   . DVT (deep venous thrombosis) ([place])    hx of 2001 (after prostate surgery)    . Glaucoma   . Hyperlipidemia   . Hypertension   . Melanoma ([place])    face, hand  . Mitral valve prolapse   . PE (pulmonary embolism)    5-09:was referred to hematology, and they recommend to discontinue Coumadin 5/11  . Prostate cancer [facility])     released from urology in 2010,needs yearly PSAs with PCP  . Seasonal allergies    Past Surgical History:  Procedure Laterality Date  . CATARACT EXTRACTION Bilateral   . COLONOSCOPY     several  . EYE SURGERY Bilateral    Cat Sx  . PARS PLANA VITRECTOMY Left 09/06/2019   Procedure: PARS PLANA VITRECTOMY WITH 25 GAUGE WITH INTRAVITREAL ANTIBIOTICS;  Surgeon: [name], MD;  Location: [facility];  Service: Ophthalmology;  Laterality: Left;  . PHOTOCOAGULATION WITH LASER Left 09/06/2019   Procedure: Photocoagulation With Laser;  Surgeon: [name], MD;  Location: [facility];  Service: Ophthalmology;  Laterality: Left;  . PROSTATECTOMY  2000  . RUPTURED GLOBE EXPLORATION AND REPAIR Left 08/16/2019   Procedure: OPEN GLOBE EXPLORATION EYE POSSIBLE ANTERIOR  CHAMBER [place] OUT;  Surgeon: Lonia Skinner, MD;  Location: [facility];  Service: Ophthalmology;  Laterality: Left;    FAMILY HISTORY Family History  Problem Relation Age of Onset  . CAD Father 47   . Hypertension Father   . Glaucoma Mother   . Diabetes Neg Hx   . Colon cancer Neg Hx   . Prostate cancer Neg Hx     SOCIAL HISTORY Social History   Tobacco Use  . Smoking status: Never Smoker  . Smokeless tobacco: Never Used  Substance Use Topics  . Alcohol use: No  . Drug use: No         OPHTHALMIC EXAM:  Base Eye Exam    Visual Acuity (Snellen - Linear)      Right Left   Dist [place] 20/60 -2 CF @ face   Dist ph [place] 20/30 +1 NI       Tonometry (Tonopen, 8:18 AM)      Right Left   Pressure def 14  Pupils      Dark Light Shape React APD   Right 3 2 Round Brisk 0   Left Dilared           Visual Fields      Left Right   Restrictions Total superior temporal, inferior temporal, superior nasal, inferior nasal deficiencies        Extraocular Movement      Right Left    Full Full       Neuro/Psych    Oriented x3: Yes   Mood/Affect: Normal       Dilation    Left eye: 1.0% Mydriacyl, 2.5% Phenylephrine @ 8:19 AM        Slit Lamp and Fundus Exam    Slit Lamp Exam      Right Left   Lids/Lashes Dermatochalasis - upper lid, Meibomian gland dysfunction Ptosis, ointment on lids   Conjunctiva/Sclera White and quiet mild [place]; sutures intact   Cornea Arcus, central haze/crocodile shegreen  Arcus, central haze/crocodile shagreen , 3+ Punctate epithelial erosions   Anterior Chamber Deep and quiet Deep, blood stained vitreous prolapse temporally -- improved, fibrin reaction overlying IOL, 3-4+ cell/flare, irregular epi/tear film   Iris Round and dilated Round and dilated, severely atrophic   Lens PC IOL in good position PC IOL in good position   Vitreous Vitreous syneresis post vitrectomy       Fundus Exam      Right Left   Disc  Pink and Sharp, mild pallor   C/D Ratio 0.6 0.6   Macula  Flat, Good foveal reflex, mild Retinal pigment epithelial mottling   Vessels  Vascular attenuation   Periphery  Attached           Refraction    Wearing Rx      Sphere  Cylinder Axis Add   Right -1.75 +1.00 167 +2.50   Left -1.50 +2.25 177 +2.50   Type: Bifocal          IMAGING AND PROCEDURES  Imaging and Procedures for @TODAY @           ASSESSMENT/PLAN:    ICD-10-CM   1. Left eye injury, sequela  S05.92XS   2. Hyphema of left eye  H21.02   3. Vitreous prolapse of left eye  H43.02   4. Vitreous hemorrhage of left eye ([place])  H43.12   5. Closed fracture of orbit, sequela ([place])  S02.85XS   6. Retinal edema  H35.81   7. Primary open angle glaucoma of both eyes, unspecified glaucoma stage  H40.1130   8. Pseudophakia of both eyes  Z96.1     1-6. Eye trauma with orbital fractures, hyphema, vitreous prolapse and vitreous hemorrhage OS  - mechanism of injury -- fall on 04.08.21  - under the expert management of Dr. Eulas Post  - underwent exploration with Dr. Eulas Post on 4.15.21  - no globe rupture  - initially had elevated IOPs in the setting of history of POAG managed by Dr. Jerline Pain  - IOP stably improved today  - significant debris and old heme persisted in the [facility] and vitreous cavity  - now POD1 s/p PPV w/ EL and intravitreal abx OS, 05.06.21             - intra-op -- retina in good shape without pathology  - vitreous cultures for fungus and bacteria 5.6.21 -- no growth  - doing well this morning             - retina attached             -  IOP good at 14              - start   PF 4x/day OS                          zymaxid QID OS                         PSO ung QID OS              - keep head elevated             - eye shield when sleeping              - post op drop and positioning instructions reviewed              - tylenol/ibuprofen for pain   - f/u Thursday, May 13  7. History of POAG  8. Pseudophakia OU  - s/p CE/IOL OU  - IOLs in good position   Ophthalmic Meds Ordered this visit:  No orders of the defined types were placed in this encounter.      Return in about 6 days (around 09/13/2019) for f/u VH OS, DFE, OCT.  There are  no Patient Instructions on file for this visit.   Explained the diagnoses, plan, and follow up with the patient and they expressed understanding.  Patient expressed understanding of the importance of proper follow up care.   This document serves as a record of services personally performed by Gardiner Sleeper, MD, PhD. It was created on their behalf by Ernest Mallick, OA, an ophthalmic assistant. The creation of this record is the provider's dictation and/or activities during the visit.    Electronically signed by: Ernest Mallick, OA 05.03.2021 10:29 PM   Gardiner Sleeper, M.D., Ph.D. Diseases & Surgery of the Retina and Vitreous [facility]  I have reviewed the above documentation for accuracy and completeness, and I agree with the above. Gardiner Sleeper, M.D., Ph.D. 09/10/19 10:29 PM  Abbreviations: M myopia (nearsighted); A astigmatism; H hyperopia (farsighted); P presbyopia; Mrx spectacle prescription;  CTL contact lenses; OD right eye; OS left eye; OU both eyes  XT exotropia; ET esotropia; PEK punctate epithelial keratitis; PEE punctate epithelial erosions; DES dry eye syndrome; MGD meibomian gland dysfunction; ATs artificial tears; PFAT's preservative free artificial tears; [place] nuclear sclerotic cataract; PSC posterior subcapsular cataract; ERM epi-retinal membrane; PVD posterior vitreous detachment; RD retinal detachment; DM diabetes mellitus; DR diabetic retinopathy; NPDR non-proliferative diabetic retinopathy; PDR proliferative diabetic retinopathy; CSME clinically significant macular edema; DME diabetic macular edema; dbh dot blot hemorrhages; CWS cotton wool spot; POAG primary open angle glaucoma; C/D cup-to-disc ratio; HVF humphrey visual field; GVF goldmann visual field; OCT optical coherence tomography; IOP intraocular pressure; BRVO Branch retinal vein occlusion; CRVO central retinal vein occlusion; CRAO central retinal artery occlusion; BRAO branch retinal artery  occlusion; RT retinal tear; SB scleral buckle; PPV pars plana vitrectomy; VH Vitreous hemorrhage; PRP panretinal laser photocoagulation; IVK intravitreal kenalog; VMT vitreomacular traction; MH Macular hole;  NVD neovascularization of the disc; NVE neovascularization elsewhere; AREDS age related eye disease study; ARMD age related macular degeneration; POAG primary open angle glaucoma; EBMD epithelial/anterior basement membrane dystrophy; ACIOL anterior chamber intraocular lens; IOL intraocular lens; PCIOL posterior chamber intraocular lens; Phaco/IOL phacoemulsification with intraocular lens placement; [place] photorefractive keratectomy; LASIK laser assisted in situ keratomileusis; HTN hypertension; DM diabetes mellitus; COPD chronic  obstructive pulmonary disease

## 2019-09-03 NOTE — Progress Notes (Signed)
Pre visit review using our clinic review tool, if applicable. No additional management support is needed unless otherwise documented below in the visit note. 

## 2019-09-03 NOTE — Assessment & Plan Note (Signed)
Facial fracture: Since the last office visit, he got very anxious about his future and set up this appointment however after talking with a friend and noticing less pain than before he feels better today. Reports to me that at this point does not feel he needs any anxiety medication. They are planning to do a PPV on the left eye he is hopeful that that will help him. Anxiety: As described above Chest pain: See last visit, chest x-ray show most likely lung contusion.  Chest pain is greatly decreased.  Rarely takes Ultram. No other concerns today.

## 2019-09-03 NOTE — Progress Notes (Signed)
Subjective:    Patient ID: Brandon Burke, male    DOB: 1931/11/16, 86 y.o.   MRN: [id number]  DOS:  09/03/2019 Type of visit - description: Virtual Visit via Telephone  Attempted  to make this a video visit, due to technical difficulties from the patient side it was not possible  thus we proceeded with a Virtual Visit via Telephone    I connected with above mentioned patient  by telephone and verified that I am speaking with the correct person using two identifiers.  THIS ENCOUNTER IS A VIRTUAL VISIT DUE TO COVID-19 - PATIENT WAS NOT SEEN IN THE OFFICE. PATIENT HAS CONSENTED TO VIRTUAL VISIT / TELEMEDICINE VISIT   Location of patient: home  Location of provider: office  I discussed the limitations, risks, security and privacy concerns of performing an evaluation and management service by telephone and the availability of in person appointments. I also discussed with the patient that there may be a patient responsible charge related to this service. The patient expressed understanding and agreed to proceed.  Acute When he set up the visit, he was feeling very anxious regards his future. Since then however he had a good talk with a friend and that has relief his mind quite a bit.    Review of Systems Pain from his recent fall and injury has decreased.  Chest pain, see last visit, much improved Denies fever, chills.  No cough, difficulty breathing or lower extremity edema  Past Medical History:  Diagnosis Date  . Constipation   . DVT (deep venous thrombosis) ([place])    hx of 2001 (after prostate surgery)  . Hyperlipidemia   . Hypertension   . Mitral valve prolapse   . PE (pulmonary embolism)    5-09:was referred to hematology, and they recommend to discontinue Coumadin 5/11  . Prostate cancer [facility])     released from urology in 2010,needs yearly PSAs with PCP  . Seasonal allergies     Past Surgical History:  Procedure Laterality Date  . CATARACT EXTRACTION Bilateral   .  COLONOSCOPY    . EYE SURGERY Bilateral    Cat Sx  . PROSTATECTOMY  2000  . RUPTURED GLOBE EXPLORATION AND REPAIR Left 08/16/2019   Procedure: OPEN GLOBE EXPLORATION EYE POSSIBLE ANTERIOR  CHAMBER [place] OUT;  Surgeon: Lonia Skinner, MD;  Location: [facility];  Service: Ophthalmology;  Laterality: Left;    Allergies as of 09/03/2019      Reactions   Nitrofuran Derivatives Other (See Comments)   Caused headaches   Sulfonamide Derivatives Nausea And Vomiting      Medication List       Accurate as of Sep 03, 2019  9:41 PM. If you have any questions, ask your nurse or doctor.        acetaminophen 500 MG tablet Commonly known as: TYLENOL Take 1,000 mg by mouth every 6 (six) hours as needed for mild pain or headache.   Align 4 MG Caps Take 4 mg by mouth daily.   ARTIFICIAL TEARS OP Place 1 drop into both eyes 3 (three) times daily as needed (for dryness).   aspirin EC 81 MG tablet Take 81 mg by mouth daily.   atenolol 50 MG tablet Commonly known as: TENORMIN Take 1 tablet (50 mg total) by mouth daily.   azelastine 0.1 % nasal spray Commonly known as: ASTELIN Place 2 sprays into both nostrils at bedtime as needed for rhinitis or allergies. What changed:   how much to take  when to take this   calcium carbonate 500 MG chewable tablet Commonly known as: TUMS - dosed in mg elemental calcium Chew 1-2 tablets by mouth as needed for indigestion or heartburn.   diphenhydrAMINE 25 MG tablet Commonly known as: BENADRYL Take 25 mg by mouth daily as needed for allergies.   diphenhydramine-acetaminophen 25-500 MG Tabs tablet Commonly known as: TYLENOL PM Take 1 tablet by mouth at bedtime as needed (for sleep).   docusate sodium 100 MG capsule Commonly known as: COLACE Take 100 mg by mouth daily as needed for mild constipation.   fluocinonide 0.05 % external solution Commonly known as: LIDEX Apply 1 application topically See admin instructions. Apply to the scalp as directed at  bedtime   fluticasone 50 MCG/ACT nasal spray Commonly known as: FLONASE Place 2 sprays into both nostrils daily as needed for allergies or rhinitis.   folic acid A999333 MCG tablet Commonly known as: FOLVITE Take 400 mcg by mouth at bedtime.   multivitamin,tx-minerals tablet Take 1 tablet by mouth at bedtime.   NEOMYCIN-POLYMYXIN EX Place 1 application into both eyes in the morning, at noon, and at bedtime.   NON FORMULARY Take 1-2 capsules by mouth See admin instructions. [place] capsules: Take 1 capsule by mouth once a day, alternating with 2 capsules every other day   ofloxacin 0.3 % ophthalmic solution Commonly known as: OCUFLOX Place 1 drop into the left eye 4 (four) times daily.   ondansetron 4 MG tablet Commonly known as: Zofran Take 1 tablet (4 mg total) by mouth every 8 (eight) hours as needed for nausea or vomiting.   polyethylene glycol 17 g packet Commonly known as: MIRALAX / GLYCOLAX Take 17 g by mouth daily. MIX AND DRINK   pravastatin 40 MG tablet Commonly known as: PRAVACHOL Take 1 tablet (40 mg total) by mouth at bedtime.   prednisoLONE acetate 1 % ophthalmic suspension Commonly known as: PRED FORTE Place 1 drop into the left eye 4 (four) times daily.   timolol 0.5 % ophthalmic solution Commonly known as: TIMOPTIC Place 1 drop into both eyes 2 (two) times daily.   traMADol 50 MG tablet Commonly known as: Ultram Take 1 tablet (50 mg total) by mouth 2 (two) times daily as needed.   Travatan Z 0.004 % Soln ophthalmic solution Generic drug: Travoprost (BAK Free) Place 1 drop into both eyes at bedtime.          Objective:   Physical Exam Ht 6' (1.829 m)   BMI 20.75 kg/m  This is a virtual telephone visit, alert oriented x3, no apparent distress, speaking in complete sentences.    Assessment      Assessment Hyperlipidemia Prostate cancer, released from urology 2010, Rx yearly PSAs by PCP HEM:DVT 2001 after surgery, PE 2009, eval by  hematology, they recommend DC Coumadin 2011 H/o MVP-- on BB glaucoma Sees dermatology q 6 months   PLAN Facial fracture: Since the last office visit, he got very anxious about his future and set up this appointment however after talking with a friend and noticing less pain than before he feels better today. Reports to me that at this point does not feel he needs any anxiety medication. They are planning to do a PPV on the left eye he is hopeful that that will help him. Anxiety: As described above Chest pain: See last visit, chest x-ray show most likely lung contusion.  Chest pain is greatly decreased.  Rarely takes Ultram. No other concerns today.  I discussed the assessment and treatment plan with the patient. The patient was provided an opportunity to ask questions and all were answered. The patient agreed with the plan and demonstrated an understanding of the instructions.   The patient was advised to call back or seek an in-person evaluation if the symptoms worsen or if the condition fails to improve as anticipated.  I provided  14 minutes of non-face-to-face time during this encounter.  [name], MD

## 2019-09-03 NOTE — H&P (Signed)
Brandon Burke is an 86 y.o. male.    Chief Complaint: Traumatic eye injury  HPI: Pt with history of fall with injury to OS on 4.8.21. Underwent globe exploration on 4.15.21 with Dr. Eulas Post and was then referred here for vitreous hemorrhage OS. On exam was found to have significant vitreous opacities consistent with old hemorrhage from trauma. B-scan ultrasound was done, showing no obvious RT/RD. After a discussion of the risks, benefits, limitations and alternatives to surgery, the patient elected to proceed with surgery to clear out the persistent vitreous hemorrhage and vitreous debris--25g PPV OS under general anesthesia.  Past Medical History:  Diagnosis Date  . Constipation   . DVT (deep venous thrombosis) ([place])    hx of 2001 (after prostate surgery)  . Hyperlipidemia   . Hypertension   . Mitral valve prolapse   . PE (pulmonary embolism)    5-09:was referred to hematology, and they recommend to discontinue Coumadin 5/11  . Prostate cancer [facility])     released from urology in 2010,needs yearly PSAs with PCP  . Seasonal allergies     Past Surgical History:  Procedure Laterality Date  . CATARACT EXTRACTION Bilateral   . COLONOSCOPY    . EYE SURGERY Bilateral    Cat Sx  . PROSTATECTOMY  2000  . RUPTURED GLOBE EXPLORATION AND REPAIR Left 08/16/2019   Procedure: OPEN GLOBE EXPLORATION EYE POSSIBLE ANTERIOR  CHAMBER [place] OUT;  Surgeon: Lonia Skinner, MD;  Location: [facility];  Service: Ophthalmology;  Laterality: Left;    Family History  Problem Relation Age of Onset  . CAD Father 86  . Hypertension Father   . Glaucoma Mother   . Diabetes Neg Hx   . Colon cancer Neg Hx   . Prostate cancer Neg Hx    Social History:  reports that he has never smoked. He has never used smokeless tobacco. He reports that he does not drink alcohol or use drugs.  Allergies:  Allergies  Allergen Reactions  . Nitrofuran Derivatives Other (See Comments)    Caused headaches   . Sulfonamide  Derivatives Nausea And Vomiting    No medications prior to admission.    Review of systems otherwise negative  There were no vitals taken for this visit.  Physical exam: Mental status: oriented x3. Eyes: See eye exam associated with this date of surgery Ears, Nose, Throat: within normal limits Neck: Within Normal limits General: within normal limits Chest: Within normal limits Breast: deferred Heart: Within normal limits Abdomen: Within normal limits GU: deferred Extremities: within normal limits Skin: within normal limits  Assessment/Plan 1. Vitreous hemorrhage following traumatic eye injury OS  Plan: To [facility] for 25g PPV + possible intravitreal antibiotics OS under general anesthesia - case scheduled for Thursday, 5.6.21, [facility] OR 08  Gardiner Sleeper, M.D., Ph.D. Vitreoretinal Surgeon [facility]

## 2019-09-04 ENCOUNTER — Telehealth: Payer: Self-pay

## 2019-09-04 ENCOUNTER — Telehealth: Payer: Self-pay | Admitting: Internal Medicine

## 2019-09-04 NOTE — Progress Notes (Signed)
°  Chronic Care Management   Outreach Note  09/04/2019 Name: KOHNER KOWALSKY MRN: [id number] DOB: July 15, 1931  Referred by: Colon Branch, MD Reason for referral : No chief complaint on file.   An unsuccessful telephone outreach was attempted today. The patient was referred to the pharmacist for assistance with care management and care coordination.    This note is not being shared with the patient for the following reason: To respect privacy (The patient or proxy has requested that the information not be shared).  Follow Up Plan:   Raynicia Dukes [employer] Scheduler

## 2019-09-04 NOTE — Telephone Encounter (Signed)
Form and information faxed to Dr. Coralyn Pear at [phone]. Surgical clearance sent for scanning.

## 2019-09-04 NOTE — Telephone Encounter (Signed)
Surgical clearance form received from [facility]. Pt is scheduled on 09/06/2019 w/ Dr. Coralyn Pear for [place] Vitrectomy under general anesthesia.

## 2019-09-04 NOTE — Telephone Encounter (Signed)
86 year old gentleman, no history of CAD, I spoke with the patient today, other than the pain from the recent fall he denies difficulty breathing, lower extremity edema, palpitations and has been active without chest pain History of DVT in 2001 after surgery.  History of PE 2009, was on Coumadin for 2 years. Last EKG 2019: 2019, normal Plan: I will pass this information to Dr. Coralyn Pear particularly the fact that he had a DVT and PE before. Otherwise he is cleared to proceed with general surgery.

## 2019-09-05 ENCOUNTER — Encounter (HOSPITAL_COMMUNITY): Payer: Self-pay | Admitting: Ophthalmology

## 2019-09-05 NOTE — Anesthesia Preprocedure Evaluation (Deleted)
Anesthesia Evaluation    Airway        Dental   Pulmonary           Cardiovascular hypertension,      Neuro/Psych    GI/Hepatic   Endo/Other    Renal/GU      Musculoskeletal   Abdominal   Peds  Hematology   Anesthesia Other Findings   Reproductive/Obstetrics                             Anesthesia Physical Anesthesia Plan  ASA:   Anesthesia Plan:    Post-op Pain Management:    Induction:   PONV Risk Score and Plan:   Airway Management Planned:   Additional Equipment:   Intra-op Plan:   Post-operative Plan:   Informed Consent:   Plan Discussed with:   Anesthesia Plan Comments: (PAT note written 09/05/2019 by Myra Gianotti, PA-C. )        Anesthesia Quick Evaluation

## 2019-09-05 NOTE — Progress Notes (Signed)
Patient denies shortness of breath, fever, cough and chest pain.  PCP - Dr Larose Kells ([facility] in [place] 09/04/19) Cardiologist - n/a   Chest x-ray - 08/07/19 (2V) EKG - DOS 09/06/19 Stress Test - 03/16/12 ECHO - 03/03/12 Cardiac Cath - n/a  Aspirin Instructions: Last dose on 08/08/19.  Anesthesia review: Yes  STOP now taking any Aspirin (unless otherwise instructed by your surgeon), Aleve, Naproxen, Ibuprofen, Motrin, Advil, Goody's, BC's, all herbal medications, fish oil, and all vitamins.   Coronavirus Screening Covid test on 09/03/19 was negative.  Patient/Wife Brandon Burke verbalized understanding of instructions that were given via phone.

## 2019-09-05 NOTE — Progress Notes (Signed)
Anesthesia Chart Review: SAME DAY WORK-UP   Case: [id number] Date/Time: 09/06/19 1407   Procedure: PARS PLANA VITRECTOMY WITH 25 GAUGE (Left )   Anesthesia type: General   Pre-op diagnosis: left eye vitreous hemorrhage   Location: [facility] OR ROOM 08 / [facility]   Surgeons: [name], MD      DISCUSSION: Patient is an 86 year old male scheduled for the above procedure. He is s/p left eye globe exploration with possible anterior chamber washout 08/16/19 by Gala Romney, MD due to uveal tissue on the ocular surface concerning for ruptured globe following a fall (tangled up in garden hose) with left orbital fracture.  History includes never smoker, HTN, HLD, MVP, prostate cancer (s/p prostatectomy 2000), PE (2009), DVT (2001, post-prostatectomy), glaucoma.   Preoperative COVID-19 test negative on 09/03/2019.  Anesthesia team to evaluate on the day of surgery.  VS: Ht 6' (1.829 m)   Wt 70.3 kg   BMI 21.02 kg/m   BP Readings from Last 3 Encounters:  08/27/19 (!) 124/51  08/27/19 116/69  08/16/19 (!) 141/52    PROVIDERS: Colon Branch, MD is PCP    LABS: For day of surgery. As of 08/11/19, H/H 13.3/38.6, PLT 146, Cr 1.20, glucose 116.   IMAGES: CXR 08/27/19: IMPRESSION: - Nonspecific ill-defined opacity within the left lung base which may reflect atelectasis, scarring, pneumonia and/or pulmonary contusion. Radiographic follow-up is recommended to ensure stability/resolution and to exclude alternative etiologies. - Small left pleural effusion versus pleural scarring at the left lung base. - Mild right basilar atelectasis and/or scarring.  CT head 08/09/19 (post fall): IMPRESSION: 1. Left frontal scalp hematoma. No acute intracranial abnormality. No calvarial fracture. 2. Air in the cavernous sinus may be tracking from left orbital fracture or related to IV placement. Lack of opacification of the adjacent sphenoid sinus argues against skull base fracture, and there is no evidence of  pneumocephalus. Orbital fracture is characterized and reported on separate face CT. 3. Age related atrophy and chronic small vessel ischemia.  CT maxillofacial 08/09/19 (post fall): IMPRESSION: 1. Comminuted displaced and comminuted blowout fracture of the left orbit involving the medial and inferior orbital walls. Large amount of retrobulbar air and soft tissue edema. Small focus of high density within the posterior chamber of the left globe may represent small focus of hemorrhage. No obvious globe deformity. 2. The left inferior rectus and superior oblique muscles are poorly defined due to adjacent air and edema. 3. There is air in the cavernous sinuses, however no fluid or opacification of adjacent sphenoid sinuses. This air may be tracking from left orbital fracture or secondary to IV catheter placement. Lack of fluid in the sphenoid sinus argues against skull base Fracture.   EKG: Last EKG seen is from 09/27/17 and showed NSR.   CV: Nuclear stress test 03/16/12: Overall Impression:  Normal stress nuclear study.  No evidence of ischemia.   LV Ejection Fraction: 66%.  LV Wall Motion:  NL LV Function; NL Wall Motion.  Echo 03/03/12: Study Conclusions  - Left ventricle: The cavity size was normal. Wall thickness  was normal. The estimated ejection fraction was 60%. Wall  motion was normal; there were no regional wall motion  abnormalities.  - Pulmonary arteries: PA peak pressure: 76mm Hg (S).    Past Medical History:  Diagnosis Date  . Constipation   . DVT (deep venous thrombosis) ([place])    hx of 2001 (after prostate surgery)  . Glaucoma   . Hyperlipidemia   .  Hypertension   . Mitral valve prolapse   . PE (pulmonary embolism)    5-09:was referred to hematology, and they recommend to discontinue Coumadin 5/11  . Prostate cancer [facility])     released from urology in 2010,needs yearly PSAs with PCP  . Seasonal allergies     Past Surgical History:  Procedure  Laterality Date  . CATARACT EXTRACTION Bilateral   . COLONOSCOPY    . EYE SURGERY Bilateral    Cat Sx  . PROSTATECTOMY  2000  . RUPTURED GLOBE EXPLORATION AND REPAIR Left 08/16/2019   Procedure: OPEN GLOBE EXPLORATION EYE POSSIBLE ANTERIOR  CHAMBER [place] OUT;  Surgeon: Lonia Skinner, MD;  Location: [facility];  Service: Ophthalmology;  Laterality: Left;    MEDICATIONS: No current facility-administered medications for this encounter.   Marland Kitchen acetaminophen (TYLENOL) 500 MG tablet  . atenolol (TENORMIN) 50 MG tablet  . azelastine (ASTELIN) 0.1 % nasal spray  . calcium carbonate (TUMS - DOSED IN MG ELEMENTAL CALCIUM) 500 MG chewable tablet  . diphenhydrAMINE (BENADRYL) 25 MG tablet  . diphenhydramine-acetaminophen (TYLENOL PM) 25-500 MG TABS  . docusate sodium (COLACE) 100 MG capsule  . fluocinonide (LIDEX) 0.05 % external solution  . fluticasone (FLONASE) 50 MCG/ACT nasal spray  . folic acid (FOLVITE) [id number] MCG tablet  . Hypromellose (ARTIFICIAL TEARS OP)  . Multiple Vitamins-Minerals (MULTIVITAMIN,TX-MINERALS) tablet  . NEOMYCIN-POLYMYXIN EX  . NON FORMULARY  . ofloxacin (OCUFLOX) 0.3 % ophthalmic solution  . ondansetron (ZOFRAN) 4 MG tablet  . polyethylene glycol (MIRALAX / GLYCOLAX) packet  . pravastatin (PRAVACHOL) 40 MG tablet  . prednisoLONE acetate (PRED FORTE) 1 % ophthalmic suspension  . Probiotic Product (ALIGN) 4 MG CAPS  . timolol (TIMOPTIC) 0.5 % ophthalmic solution  . traMADol (ULTRAM) 50 MG tablet  . Travoprost, BAK Free, (TRAVATAN Z) 0.004 % SOLN ophthalmic solution  . aspirin EC 81 MG tablet   ASA is listed as currently being on hold.   Myra Gianotti, PA-C Surgical Short Stay/Anesthesiology [facility] Phone [phone] [facility] Phone [phone] 09/05/2019 11:22 AM

## 2019-09-06 ENCOUNTER — Ambulatory Visit (HOSPITAL_COMMUNITY)
Admission: RE | Admit: 2019-09-06 | Discharge: 2019-09-06 | Disposition: A | Discharge status: Home / Self Care | Payer: PPO | Attending: Ophthalmology | Admitting: Ophthalmology

## 2019-09-06 ENCOUNTER — Encounter (HOSPITAL_COMMUNITY): Payer: Self-pay | Admitting: Ophthalmology

## 2019-09-06 ENCOUNTER — Ambulatory Visit (HOSPITAL_COMMUNITY): Payer: PPO | Admitting: Vascular Surgery

## 2019-09-06 ENCOUNTER — Other Ambulatory Visit: Payer: Self-pay

## 2019-09-06 ENCOUNTER — Encounter (HOSPITAL_COMMUNITY)
Admission: RE | Disposition: A | Discharge status: Home / Self Care | Payer: Self-pay | Source: Home / Self Care | Attending: Ophthalmology

## 2019-09-06 DIAGNOSIS — Z83511 Family history of glaucoma: Secondary | ICD-10-CM | POA: Insufficient documentation

## 2019-09-06 DIAGNOSIS — Z9841 Cataract extraction status, right eye: Secondary | ICD-10-CM | POA: Insufficient documentation

## 2019-09-06 DIAGNOSIS — Z882 Allergy status to sulfonamides status: Secondary | ICD-10-CM | POA: Insufficient documentation

## 2019-09-06 DIAGNOSIS — Z7982 Long term (current) use of aspirin: Secondary | ICD-10-CM | POA: Insufficient documentation

## 2019-09-06 DIAGNOSIS — I341 Nonrheumatic mitral (valve) prolapse: Secondary | ICD-10-CM | POA: Diagnosis not present

## 2019-09-06 DIAGNOSIS — Z9079 Acquired absence of other genital organ(s): Secondary | ICD-10-CM | POA: Insufficient documentation

## 2019-09-06 DIAGNOSIS — Z79899 Other long term (current) drug therapy: Secondary | ICD-10-CM | POA: Diagnosis not present

## 2019-09-06 DIAGNOSIS — H409 Unspecified glaucoma: Secondary | ICD-10-CM | POA: Diagnosis not present

## 2019-09-06 DIAGNOSIS — Z9842 Cataract extraction status, left eye: Secondary | ICD-10-CM | POA: Insufficient documentation

## 2019-09-06 DIAGNOSIS — Z8249 Family history of ischemic heart disease and other diseases of the circulatory system: Secondary | ICD-10-CM | POA: Insufficient documentation

## 2019-09-06 DIAGNOSIS — Z8546 Personal history of malignant neoplasm of prostate: Secondary | ICD-10-CM | POA: Diagnosis not present

## 2019-09-06 DIAGNOSIS — W19XXXA Unspecified fall, initial encounter: Secondary | ICD-10-CM | POA: Insufficient documentation

## 2019-09-06 DIAGNOSIS — I6782 Cerebral ischemia: Secondary | ICD-10-CM | POA: Insufficient documentation

## 2019-09-06 DIAGNOSIS — Z86711 Personal history of pulmonary embolism: Secondary | ICD-10-CM | POA: Diagnosis not present

## 2019-09-06 DIAGNOSIS — Z881 Allergy status to other antibiotic agents status: Secondary | ICD-10-CM | POA: Diagnosis not present

## 2019-09-06 DIAGNOSIS — H2102 Hyphema, left eye: Secondary | ICD-10-CM | POA: Insufficient documentation

## 2019-09-06 DIAGNOSIS — Y939 Activity, unspecified: Secondary | ICD-10-CM | POA: Insufficient documentation

## 2019-09-06 DIAGNOSIS — H44002 Unspecified purulent endophthalmitis, left eye: Secondary | ICD-10-CM | POA: Diagnosis not present

## 2019-09-06 DIAGNOSIS — Z86718 Personal history of other venous thrombosis and embolism: Secondary | ICD-10-CM | POA: Insufficient documentation

## 2019-09-06 DIAGNOSIS — S0522XA Ocular laceration and rupture with prolapse or loss of intraocular tissue, left eye, initial encounter: Secondary | ICD-10-CM | POA: Diagnosis not present

## 2019-09-06 DIAGNOSIS — H4312 Vitreous hemorrhage, left eye: Secondary | ICD-10-CM | POA: Diagnosis not present

## 2019-09-06 DIAGNOSIS — I10 Essential (primary) hypertension: Secondary | ICD-10-CM | POA: Diagnosis not present

## 2019-09-06 DIAGNOSIS — S0003XA Contusion of scalp, initial encounter: Secondary | ICD-10-CM | POA: Diagnosis not present

## 2019-09-06 DIAGNOSIS — C61 Malignant neoplasm of prostate: Secondary | ICD-10-CM | POA: Diagnosis not present

## 2019-09-06 DIAGNOSIS — S0232XA Fracture of orbital floor, left side, initial encounter for closed fracture: Secondary | ICD-10-CM | POA: Insufficient documentation

## 2019-09-06 DIAGNOSIS — E785 Hyperlipidemia, unspecified: Secondary | ICD-10-CM | POA: Diagnosis not present

## 2019-09-06 HISTORY — PX: PARS PLANA VITRECTOMY: SHX2166

## 2019-09-06 HISTORY — DX: Unspecified glaucoma: H40.9

## 2019-09-06 HISTORY — DX: Malignant melanoma of skin, unspecified: C43.9

## 2019-09-06 HISTORY — PX: PHOTOCOAGULATION WITH LASER: SHX6027

## 2019-09-06 LAB — CBC
HCT: 40.7 % (ref 39.0–52.0)
Hemoglobin: 13.7 g/dL (ref 13.0–17.0)
MCH: 33.1 pg (ref 26.0–34.0)
MCHC: 33.7 g/dL (ref 30.0–36.0)
MCV: 98.3 fL (ref 80.0–100.0)
Platelets: 391 10*3/uL (ref 150–400)
RBC: 4.14 MIL/uL — ABNORMAL LOW (ref 4.22–5.81)
RDW: 13 % (ref 11.5–15.5)
WBC: 7.2 10*3/uL (ref 4.0–10.5)
nRBC: 0 % (ref 0.0–0.2)

## 2019-09-06 LAB — POCT I-STAT, CHEM 8
BUN: 15 mg/dL (ref 8–23)
Calcium, Ion: 1.03 mmol/L — ABNORMAL LOW (ref 1.15–1.40)
Chloride: 103 mmol/L (ref 98–111)
Creatinine, Ser: 0.8 mg/dL (ref 0.61–1.24)
Glucose, Bld: 96 mg/dL (ref 70–99)
HCT: 42 % (ref 39.0–52.0)
Hemoglobin: 14.3 g/dL (ref 13.0–17.0)
Potassium: 4.4 mmol/L (ref 3.5–5.1)
Sodium: 141 mmol/L (ref 135–145)
TCO2: 28 mmol/L (ref 22–32)

## 2019-09-06 SURGERY — PARS PLANA VITRECTOMY WITH 25 GAUGE
Anesthesia: General | Site: Eye | Laterality: Left

## 2019-09-06 MED ORDER — SODIUM CHLORIDE (PF) 0.9 % IJ SOLN
INTRAMUSCULAR | Status: AC
  Filled 2019-09-06: qty 10

## 2019-09-06 MED ORDER — BSS PLUS IO SOLN
INTRAOCULAR | Status: AC
  Filled 2019-09-06: qty 500

## 2019-09-06 MED ORDER — ATROPINE SULFATE 1 % OP SOLN
OPHTHALMIC | Status: AC
  Filled 2019-09-06: qty 5

## 2019-09-06 MED ORDER — TRIAMCINOLONE ACETONIDE 40 MG/ML IJ SUSP
INTRAMUSCULAR | Status: DC | PRN
  Administered 2019-09-06: 20 mg

## 2019-09-06 MED ORDER — TROPICAMIDE 1 % OP SOLN
1.0000 [drp] | OPHTHALMIC | Status: AC | PRN
  Administered 2019-09-06 (×3): 1 [drp] via OPHTHALMIC
  Filled 2019-09-06: qty 15

## 2019-09-06 MED ORDER — PROPARACAINE HCL 0.5 % OP SOLN
1.0000 [drp] | OPHTHALMIC | Status: AC | PRN
  Administered 2019-09-06 (×3): 1 [drp] via OPHTHALMIC
  Filled 2019-09-06: qty 15

## 2019-09-06 MED ORDER — ROCURONIUM BROMIDE 100 MG/10ML IV SOLN
INTRAVENOUS | Status: DC | PRN
  Administered 2019-09-06: 10 mg via INTRAVENOUS
  Administered 2019-09-06: 80 mg via INTRAVENOUS

## 2019-09-06 MED ORDER — EPINEPHRINE PF 1 MG/ML IJ SOLN
INTRAOCULAR | Status: DC | PRN
  Administered 2019-09-06: 500 mL

## 2019-09-06 MED ORDER — STERILE WATER FOR INJECTION IJ SOLN
INTRAMUSCULAR | Status: AC
  Filled 2019-09-06: qty 10

## 2019-09-06 MED ORDER — BRIMONIDINE TARTRATE 0.2 % OP SOLN
OPHTHALMIC | Status: AC
  Filled 2019-09-06: qty 5

## 2019-09-06 MED ORDER — EPINEPHRINE PF 1 MG/ML IJ SOLN
INTRAMUSCULAR | Status: AC
  Filled 2019-09-06: qty 1

## 2019-09-06 MED ORDER — SUGAMMADEX SODIUM 200 MG/2ML IV SOLN
INTRAVENOUS | Status: DC | PRN
  Administered 2019-09-06: 200 mg via INTRAVENOUS

## 2019-09-06 MED ORDER — NA CHONDROIT SULF-NA HYALURON 40-30 MG/ML IO SOLN
INTRAOCULAR | Status: DC | PRN
  Administered 2019-09-06: 0.5 mL via INTRAOCULAR

## 2019-09-06 MED ORDER — GATIFLOXACIN 0.5 % OP SOLN
OPHTHALMIC | Status: AC
  Filled 2019-09-06: qty 2.5

## 2019-09-06 MED ORDER — FENTANYL CITRATE (PF) 250 MCG/5ML IJ SOLN
INTRAMUSCULAR | Status: AC
  Filled 2019-09-06: qty 5

## 2019-09-06 MED ORDER — SODIUM CHLORIDE 0.9 % IV SOLN: INTRAVENOUS | Status: DC

## 2019-09-06 MED ORDER — ONDANSETRON HCL 4 MG/2ML IJ SOLN
INTRAMUSCULAR | Status: DC | PRN
  Administered 2019-09-06: 4 mg via INTRAVENOUS

## 2019-09-06 MED ORDER — FENTANYL CITRATE (PF) 100 MCG/2ML IJ SOLN
INTRAMUSCULAR | Status: DC | PRN
  Administered 2019-09-06: 100 ug via INTRAVENOUS
  Administered 2019-09-06: 25 ug via INTRAVENOUS

## 2019-09-06 MED ORDER — BALANCED SALT IO SOLN
INTRAOCULAR | Status: DC | PRN
  Administered 2019-09-06: 15 mL via INTRAOCULAR

## 2019-09-06 MED ORDER — CEFTAZIDIME 1 G IJ SOLR
INTRAMUSCULAR | Status: AC
  Filled 2019-09-06: qty 1

## 2019-09-06 MED ORDER — TRIAMCINOLONE ACETONIDE 40 MG/ML IJ SUSP
INTRAMUSCULAR | Status: AC
  Filled 2019-09-06: qty 5

## 2019-09-06 MED ORDER — PHENYLEPHRINE HCL 10 % OP SOLN
1.0000 [drp] | OPHTHALMIC | Status: AC | PRN
  Administered 2019-09-06 (×3): 1 [drp] via OPHTHALMIC
  Filled 2019-09-06: qty 5

## 2019-09-06 MED ORDER — BACITRACIN-POLYMYXIN B 500-10000 UNIT/GM OP OINT
TOPICAL_OINTMENT | OPHTHALMIC | Status: DC | PRN
  Administered 2019-09-06: 1 via OPHTHALMIC

## 2019-09-06 MED ORDER — VANCOMYCIN INTRAVITREAL INJECTION 1 MG/0.1 ML
1.0000 mg | INTRAOCULAR | Status: DC
  Filled 2019-09-06: qty 0.1

## 2019-09-06 MED ORDER — PROPOFOL 10 MG/ML IV BOLUS
INTRAVENOUS | Status: DC | PRN
  Administered 2019-09-06: 120 mg via INTRAVENOUS

## 2019-09-06 MED ORDER — ATENOLOL 50 MG PO TABS: 50.0000 mg | ORAL_TABLET | Freq: Once | ORAL | Status: DC

## 2019-09-06 MED ORDER — BRIMONIDINE TARTRATE 0.2 % OP SOLN
OPHTHALMIC | Status: DC | PRN
  Administered 2019-09-06: 1 [drp] via OPHTHALMIC

## 2019-09-06 MED ORDER — DEXAMETHASONE SODIUM PHOSPHATE 10 MG/ML IJ SOLN
INTRAMUSCULAR | Status: DC | PRN
  Administered 2019-09-06: 5 mg via INTRAVENOUS

## 2019-09-06 MED ORDER — ATROPINE SULFATE 1 % OP SOLN
1.0000 [drp] | OPHTHALMIC | Status: AC | PRN
  Administered 2019-09-06 (×3): 1 [drp] via OPHTHALMIC
  Filled 2019-09-06: qty 5

## 2019-09-06 MED ORDER — POLYMYXIN B SULFATE 500000 UNITS IJ SOLR
INTRAMUSCULAR | Status: AC
  Filled 2019-09-06: qty 500000

## 2019-09-06 MED ORDER — EPHEDRINE 5 MG/ML INJ
INTRAVENOUS | Status: AC
  Filled 2019-09-06: qty 10

## 2019-09-06 MED ORDER — CEFTAZIDIME INTRAVITREAL INJECTION 2.25 MG/0.1 ML
INTRAVITREAL | Status: DC | PRN
  Administered 2019-09-06: 2.25 mg via INTRAVITREAL

## 2019-09-06 MED ORDER — VANCOMYCIN INTRAVITREAL INJECTION 1 MG/0.1 ML
INTRAOCULAR | Status: DC | PRN
  Administered 2019-09-06: 1 mg via INTRAVITREAL

## 2019-09-06 MED ORDER — ROCURONIUM BROMIDE 10 MG/ML (PF) SYRINGE
PREFILLED_SYRINGE | INTRAVENOUS | Status: AC
  Filled 2019-09-06: qty 20

## 2019-09-06 MED ORDER — PREDNISOLONE ACETATE 1 % OP SUSP
OPHTHALMIC | Status: DC | PRN
  Administered 2019-09-06: 1 [drp] via OPHTHALMIC

## 2019-09-06 MED ORDER — LIDOCAINE 2% (20 MG/ML) 5 ML SYRINGE
INTRAMUSCULAR | Status: AC
  Filled 2019-09-06: qty 5

## 2019-09-06 MED ORDER — GATIFLOXACIN 0.5 % OP SOLN OPTIME - NO CHARGE
OPHTHALMIC | Status: DC | PRN
  Administered 2019-09-06: 1 [drp] via OPHTHALMIC

## 2019-09-06 MED ORDER — LIDOCAINE HCL (CARDIAC) PF 100 MG/5ML IV SOSY
PREFILLED_SYRINGE | INTRAVENOUS | Status: DC | PRN
  Administered 2019-09-06: 60 mg via INTRAVENOUS

## 2019-09-06 MED ORDER — PREDNISOLONE ACETATE 1 % OP SUSP
OPHTHALMIC | Status: AC
  Filled 2019-09-06: qty 5

## 2019-09-06 MED ORDER — DORZOLAMIDE HCL-TIMOLOL MAL 2-0.5 % OP SOLN
OPHTHALMIC | Status: DC | PRN
  Administered 2019-09-06: 1 [drp] via OPHTHALMIC

## 2019-09-06 MED ORDER — PHENYLEPHRINE 40 MCG/ML (10ML) SYRINGE FOR IV PUSH (FOR BLOOD PRESSURE SUPPORT)
PREFILLED_SYRINGE | INTRAVENOUS | Status: AC
  Filled 2019-09-06: qty 30

## 2019-09-06 MED ORDER — NA CHONDROIT SULF-NA HYALURON 40-30 MG/ML IO SOLN
INTRAOCULAR | Status: AC
  Filled 2019-09-06: qty 0.5

## 2019-09-06 MED ORDER — PROPOFOL 10 MG/ML IV BOLUS
INTRAVENOUS | Status: AC
  Filled 2019-09-06: qty 20

## 2019-09-06 MED ORDER — BACITRACIN-POLYMYXIN B 500-10000 UNIT/GM OP OINT
TOPICAL_OINTMENT | OPHTHALMIC | Status: AC
  Filled 2019-09-06: qty 3.5

## 2019-09-06 MED ORDER — PHENYLEPHRINE HCL (PRESSORS) 10 MG/ML IV SOLN
INTRAVENOUS | Status: DC | PRN
  Administered 2019-09-06: 80 ug via INTRAVENOUS

## 2019-09-06 MED ORDER — DEXAMETHASONE SODIUM PHOSPHATE 10 MG/ML IJ SOLN
INTRAMUSCULAR | Status: AC
  Filled 2019-09-06: qty 1

## 2019-09-06 MED ORDER — CEFTAZIDIME INTRAVITREAL INJECTION 2.25 MG/0.1 ML
2.2500 mg | INTRAVITREAL | Status: DC
  Filled 2019-09-06: qty 0.1

## 2019-09-06 MED ORDER — DORZOLAMIDE HCL-TIMOLOL MAL 2-0.5 % OP SOLN
OPHTHALMIC | Status: AC
  Filled 2019-09-06: qty 10

## 2019-09-06 MED ORDER — DEXAMETHASONE SODIUM PHOSPHATE 10 MG/ML IJ SOLN
INTRAMUSCULAR | Status: AC
  Filled 2019-09-06: qty 2

## 2019-09-06 MED ORDER — CARBACHOL 0.01 % IO SOLN
INTRAOCULAR | Status: AC
  Filled 2019-09-06: qty 1.5

## 2019-09-06 MED ORDER — STERILE WATER FOR IRRIGATION IR SOLN
Status: DC | PRN
  Administered 2019-09-06: 1000 mL

## 2019-09-06 MED ORDER — STERILE WATER FOR INJECTION IJ SOLN
INTRAMUSCULAR | Status: DC | PRN
  Administered 2019-09-06: 20 mL

## 2019-09-06 MED ORDER — BSS IO SOLN
INTRAOCULAR | Status: AC
  Filled 2019-09-06: qty 15

## 2019-09-06 MED ORDER — PHENYLEPHRINE HCL-NACL 10-0.9 MG/250ML-% IV SOLN
INTRAVENOUS | Status: DC | PRN
  Administered 2019-09-06: 30 ug/min via INTRAVENOUS

## 2019-09-06 MED ORDER — ONDANSETRON HCL 4 MG/2ML IJ SOLN
INTRAMUSCULAR | Status: AC
  Filled 2019-09-06: qty 2

## 2019-09-06 SURGICAL SUPPLY — 45 items
APL SWBSTK 6 STRL LF DISP (MISCELLANEOUS) ×4
APPLICATOR COTTON TIP 6 STRL (MISCELLANEOUS) ×4 IMPLANT
APPLICATOR COTTON TIP 6IN STRL (MISCELLANEOUS) ×12
BLADE MVR KNIFE 20G (BLADE) ×2 IMPLANT
BNDG EYE OVAL (GAUZE/BANDAGES/DRESSINGS) ×3 IMPLANT
CABLE BIPOLOR RESECTION CORD (MISCELLANEOUS) ×3 IMPLANT
CANNULA FLEX TIP 25G (CANNULA) ×3 IMPLANT
CLOSURE STERI-STRIP 1/2X4 (GAUZE/BANDAGES/DRESSINGS) ×1
CLSR STERI-STRIP ANTIMIC 1/2X4 (GAUZE/BANDAGES/DRESSINGS) ×2 IMPLANT
DRAPE MICROSCOPE LEICA 46X105 (MISCELLANEOUS) ×3 IMPLANT
DRAPE OPHTHALMIC 77X100 STRL (CUSTOM PROCEDURE TRAY) ×3 IMPLANT
ERASER HMR WETFIELD 23G BP (MISCELLANEOUS) IMPLANT
GLOVE BIO SURGEON STRL SZ7.5 (GLOVE) ×6 IMPLANT
GLOVE BIOGEL M 7.0 STRL (GLOVE) ×3 IMPLANT
GOWN STRL REUS W/ TWL LRG LVL3 (GOWN DISPOSABLE) ×2 IMPLANT
GOWN STRL REUS W/ TWL XL LVL3 (GOWN DISPOSABLE) ×1 IMPLANT
GOWN STRL REUS W/TWL LRG LVL3 (GOWN DISPOSABLE) ×6
GOWN STRL REUS W/TWL XL LVL3 (GOWN DISPOSABLE) ×3
KIT BASIN OR (CUSTOM PROCEDURE TRAY) ×3 IMPLANT
NDL 18GX1X1/2 (RX/OR ONLY) (NEEDLE) ×1 IMPLANT
NDL 25GX 5/8IN NON SAFETY (NEEDLE) ×3 IMPLANT
NDL HYPO 30X.5 LL (NEEDLE) ×2 IMPLANT
NEEDLE 18GX1X1/2 (RX/OR ONLY) (NEEDLE) ×9 IMPLANT
NEEDLE 25GX 5/8IN NON SAFETY (NEEDLE) ×9 IMPLANT
NEEDLE HYPO 30X.5 LL (NEEDLE) ×6 IMPLANT
NS IRRIG 1000ML POUR BTL (IV SOLUTION) ×3 IMPLANT
PACK VITRECTOMY CUSTOM (CUSTOM PROCEDURE TRAY) ×3 IMPLANT
PAD ARMBOARD 7.5X6 YLW CONV (MISCELLANEOUS) ×6 IMPLANT
PAK PIK VITRECTOMY CVS 25GA (OPHTHALMIC) ×3 IMPLANT
PENCIL BIPOLAR 25GA STR DISP (OPHTHALMIC RELATED) ×3 IMPLANT
PROBE DIATHERMY DSP 27GA (MISCELLANEOUS) IMPLANT
PROBE ENDO DIATHERMY 25G (MISCELLANEOUS) ×2 IMPLANT
PROBE LASER ILLUM FLEX CVD 25G (OPHTHALMIC) ×2 IMPLANT
SOL ANTI FOG 6CC (MISCELLANEOUS) IMPLANT
SOLUTION ANTI FOG 6CC (MISCELLANEOUS) ×2
STOPCOCK 4 WAY LG BORE MALE ST (IV SETS) IMPLANT
SUT VICRYL 7 0 TG140 8 (SUTURE) ×3 IMPLANT
SYR 10ML LL (SYRINGE) ×3 IMPLANT
SYR 20ML LL LF (SYRINGE) ×3 IMPLANT
SYR 5ML LL (SYRINGE) ×3 IMPLANT
SYR BULB EAR ULCER 3OZ GRN STR (SYRINGE) ×3 IMPLANT
SYR TB 1ML LUER SLIP (SYRINGE) ×6 IMPLANT
TOWEL GREEN STERILE FF (TOWEL DISPOSABLE) ×3 IMPLANT
TUBING HIGH PRESS EXTEN 6IN (TUBING) ×3 IMPLANT
WATER STERILE IRR 1000ML POUR (IV SOLUTION) ×3 IMPLANT

## 2019-09-06 NOTE — Anesthesia Postprocedure Evaluation (Signed)
Anesthesia Post Note  Patient: Brandon Burke [facility]  Procedure(s) Performed: PARS PLANA VITRECTOMY WITH 25 GAUGE WITH INTRAVITREAL ANTIBIOTICS (Left Eye) Photocoagulation With Laser (Left Eye)     Patient location during evaluation: PACU Anesthesia Type: General Level of consciousness: sedated Pain management: pain level controlled Vital Signs Assessment: post-procedure vital signs reviewed and stable Respiratory status: spontaneous breathing and respiratory function stable Cardiovascular status: stable Postop Assessment: no apparent nausea or vomiting Anesthetic complications: no    Last Vitals:  Vitals:   09/06/19 1655 09/06/19 1710  BP: (!) 155/72 (!) 167/71  Pulse: 62 62  Resp: 18 15  Temp:  (!) 36.3 C  SpO2: 96% 98%    Last Pain:  Vitals:   09/06/19 1710  TempSrc:   PainSc: 0-No pain                 Alan Drummer DANIEL

## 2019-09-06 NOTE — Transfer of Care (Signed)
Immediate Anesthesia Transfer of Care Note  Patient: Brandon Burke [facility]  Procedure(s) Performed: PARS PLANA VITRECTOMY WITH 25 GAUGE WITH INTRAVITREAL ANTIBIOTICS (Left Eye) Photocoagulation With Laser (Left Eye)  Patient Location: PACU  Anesthesia Type:General  Level of Consciousness: drowsy  Airway & Oxygen Therapy: Patient Spontanous Breathing and Patient connected to nasal cannula oxygen  Post-op Assessment: Report given to RN, Post -op Vital signs reviewed and stable and Patient moving all extremities  Post vital signs: Reviewed and stable  Last Vitals:  Vitals Value Taken Time  BP 161/71 09/06/19 1639  Temp    Pulse 63 09/06/19 1643  Resp 15 09/06/19 1643  SpO2 100 % 09/06/19 1643  Vitals shown include unvalidated device data.  Last Pain:  Vitals:   09/06/19 1244  TempSrc:   PainSc: 0-No pain      Patients Stated Pain Goal: 3 ([id number])  Complications: No apparent anesthesia complications

## 2019-09-06 NOTE — Anesthesia Procedure Notes (Signed)
Procedure Name: Intubation Date/Time: 09/06/2019 2:27 PM Performed by: Kenwood Rosiak T, CRNA Pre-anesthesia Checklist: Patient identified, Emergency Drugs available, Suction available and Patient being monitored Patient Re-evaluated:Patient Re-evaluated prior to induction Oxygen Delivery Method: Circle system utilized Preoxygenation: Pre-oxygenation with 100% oxygen Induction Type: IV induction Ventilation: Mask ventilation without difficulty Laryngoscope Size: Miller and 3 Tube type: Oral Tube size: 7.5 mm Number of attempts: 1 Airway Equipment and Method: Stylet Placement Confirmation: ETT inserted through vocal cords under direct vision,  positive ETCO2 and breath sounds checked- equal and bilateral Secured at: 22 cm Tube secured with: Tape Dental Injury: Teeth and Oropharynx as per pre-operative assessment

## 2019-09-06 NOTE — Brief Op Note (Signed)
09/06/2019  4:34 PM  PATIENT:  Brandon Burke  86 y.o. male  PRE-OPERATIVE DIAGNOSIS:  left eye vitreous hemorrhage  POST-OPERATIVE DIAGNOSIS:  left eye vitreous hemorrhage  PROCEDURE:  Procedure(s): PARS PLANA VITRECTOMY WITH 25 GAUGE WITH INTRAVITREAL ANTIBIOTICS (Left) Photocoagulation With Laser (Left)  SURGEON:  Surgeon(s) and Role:    [name], MD - Primary  ASSISTANTS: Ernest Mallick, Ophthalmic Assistant    ANESTHESIA:   general  EBL:  minimal   BLOOD ADMINISTERED:none  DRAINS: none   LOCAL MEDICATIONS USED:  NONE  SPECIMEN:  Aspirate  DISPOSITION OF SPECIMEN:  Microbiology  COUNTS:  YES  TOURNIQUET:  * No tourniquets in log *  DICTATION: .Note written in EPIC  PLAN OF CARE: Discharge to home after PACU  PATIENT DISPOSITION:  PACU - hemodynamically stable.   Delay start of Pharmacological VTE agent (>24hrs) due to surgical blood loss or risk of bleeding: not applicable

## 2019-09-06 NOTE — Interval H&P Note (Signed)
History and Physical Interval Note:  09/06/2019 1:50 PM  Brandon Burke  has presented today for surgery, with the diagnosis of left eye vitreous hemorrhage.  The various methods of treatment have been discussed with the patient and family. After consideration of risks, benefits and other options for treatment, the patient has consented to  Procedure(s): PARS PLANA VITRECTOMY WITH 25 GAUGE (Left) as a surgical intervention.  The patient's history has been reviewed, patient examined, no change in status, stable for surgery.  I have reviewed the patient's chart and labs.  Questions were answered to the patient's satisfaction.     Brandon Burke

## 2019-09-06 NOTE — Op Note (Signed)
Date of procedure: 4.6.21   Surgeon: [name], MD, PhD   Pre-operative Diagnoses:  1. Vitreous hemorrhage, Left Eye 2. Vitreous prolapse, Left  3. Hyphema, Left Eye 4. Presumed endophthalmitis, Left Eye   Post-operative Diagnoses:  same    Anesthesia: GETA   Procedure: 1.  25 gauge pars plana vitrectomy, Left Eye CPT 67140  2.  Anterior chamber washout, Left Eye  CPT 65800 3.  Endolaser, Left Eye 4.  Intravitreal injection of antibiotics (Vancomycin, Ceftazidime), Left Eye  Complications: none Estimated blood loss: minimal   Brief history:  The patient has a history of traumatic injury to the left eye following a fall w/ decreased vision in the affected eye. On examination, was noted to have a dense, nonclearing vitreous hemorrhage, some focal vitreous prolapse and hyphema in the anterior chamber, and presumed endophtalmitis, affecting activities of daily living. The risks, benefits, and alternatives were explained to the patient, including pain, bleeding, infection, loss of vision, double vision, droopy eyelids, and need for more surgeries.  Informed consent was obtained from the patient and placed in the chart.       Procedure: The patient was brought to the preoperative holding area where the correct eye was confirmed and marked.  The patient was then brought to the operating room where general endotracheal anesthesia was induced. A secondary time-out was performed to identify the correct patient, eye, procedure, and any allergies. The eye was prepped and draped in the usual sterile ophthalmic fashion followed by placement of a lid speculum. Notably, the left eye was enophthalmic and set deeper into the orbit than the right eye.  A 25 gauge trocar was placed in the inferotemporal quadrant 3.5 mm posterior to the limbus in a beveled fashion. Two additional 25 gauge trocars were placed in the superonasal and superotemporal quadrants in a similar beveled fashion. Of note, the eye  was slightly fixed in superior gaze and forced ductions of the eye were quite restricted. A 4 mm infusion cannula was placed through the inferotemporal trocar but left clamped. The light pipe and cutter were introduced into the eye. Via direct visualization, an undiluted vitreous sample of about 1 cc was obtained using a 3 cc syringe attached to the vitrectomy probe. This sample was labeled and sent to the microbiology lab for gram stain, bacterial and fungal cultures. The infusion was then turned on. Dense fibrin membrane and vitreous heme and debris obscured the view of the retina.   Additional anterior vitrectomy was performed under direct visualization to clear the fibrin and vitreous hemorrhage from the anterior vitreous. Then, a standard three-port pars plana vitrectomy was performed using the light pipe, the cutter, and the BIOM viewing system. A thorough core and peripheral vitreous dissection was performed. Once the vitreous hemorrhage and vitreous debris was cleared, it was noted that the optic disc was perfused and the macula was attached and healthy appearing. A posterior vitreous detachment was confirmed over the optic nerve.    A 20g MVR blade was used to create an anterior chamber paracentesis at [place]. Using this paracentesis and the vitrectomy probe, an anterior chamber washout was performed to clear out some of the clotted hyphema and vitreous prolapse.  Using scleral depression, the blood stained vitreous base was carefully removed. On inspection of the peripheral retina, there were no retinal breaks or tears. Prophylactic endolaser was applied to the retinal periphery almost 360. Application of the laser was limited by the patient's ocular and orbital anatomy.  At this  time, the trocars were removed and sutured with 7-0 vicryl in an interrupted fashion. Compounded intravitreal antibiotics (1mg /0.79mL Vancomycin; and 2.25mg /0.8mL Ceftazidime) were then injected through the pars plana  via 30g needles. Then, subconjunctival injections of Antibiotic and kenalog were administered. The lid speculum and drapes were removed. Drops of an antibiotic, antihypertensives, and steroid were given. The eye was patched and shielded. The patient tolerated the procedure well without any intraoperative or immediate postoperative complications. The patient was taken to the recovery room in good condition. The patient was instructed to maintain a head-upright position and will be seen by Dr. Coralyn Pear tomorrow morning in clinic.

## 2019-09-06 NOTE — Discharge Instructions (Signed)
POSTOPERATIVE INSTRUCTIONS  Your doctor has performed vitreoretinal surgery on you at [facility]. [place] eye patched and shielded until seen by Dr. Coralyn Pear 815 AM tomorrow in clinic - Do not use drops until return - Keep head elevated while awake - Sleep with head elevated  - No strenuous bending, stooping or lifting.  - You may not drive until further notice.  - Tylenol or any other over-the-counter pain reliever can be used according to your doctor. If more pain medicine is required, your doctor will have a prescription for you.  - You may read, go up and down stairs, and watch television.     [name], M.D., Ph.D.

## 2019-09-06 NOTE — Anesthesia Preprocedure Evaluation (Addendum)
Anesthesia Evaluation  Patient identified by MRN, date of birth, ID band Patient awake    Reviewed: Allergy & Precautions, NPO status , Patient's Chart, lab work & pertinent test results, reviewed documented beta blocker date and time   History of Anesthesia Complications Negative for: history of anesthetic complications  Airway Mallampati: II  TM Distance: >3 FB Neck ROM: Full    Dental no notable dental hx. (+) Dental Advisory Given   Pulmonary neg pulmonary ROS,  415/2021 SARS coronavirus NEG    Pulmonary exam normal        Cardiovascular hypertension, Pt. on medications and Pt. on home beta blockers (-) angina+ DVT  Normal cardiovascular exam  CV: Nuclear stress test 03/16/12: Overall Impression: Normal stress nuclear study.No evidence of ischemia.  LV Ejection Fraction: 66%. LV Wall Motion: NL LV Function; NL Wall Motion.  Echo 03/03/12: Study Conclusions  - Left ventricle: The cavity size was normal. Wall thickness  was normal. The estimated ejection fraction was 60%. Wall  motion was normal; there were no regional wall motion  abnormalities.  - Pulmonary arteries: PA peak pressure: 80mm Hg (S).     Neuro/Psych  Headaches,    GI/Hepatic negative GI ROS, Neg liver ROS,   Endo/Other  negative endocrine ROS  Renal/GU negative Renal ROS   H/o prostate cancer    Musculoskeletal  (+) Arthritis ,   Abdominal   Peds  Hematology negative hematology ROS (+)   Anesthesia Other Findings   Reproductive/Obstetrics                            Anesthesia Physical  Anesthesia Plan  ASA: III  Anesthesia Plan: General   Post-op Pain Management:    Induction: Intravenous  PONV Risk Score and Plan: 2 and Ondansetron and Dexamethasone  Airway Management Planned: Oral ETT  Additional Equipment:   Intra-op Plan:   Post-operative Plan: Extubation in OR  Informed  Consent: I have reviewed the patients History and Physical, chart, labs and discussed the procedure including the risks, benefits and alternatives for the proposed anesthesia with the patient or authorized representative who has indicated his/her understanding and acceptance.     Dental advisory given  Plan Discussed with: CRNA and Anesthesiologist  Anesthesia Plan Comments:        Anesthesia Quick Evaluation

## 2019-09-07 ENCOUNTER — Encounter (INDEPENDENT_AMBULATORY_CARE_PROVIDER_SITE_OTHER): Payer: PPO | Admitting: Ophthalmology

## 2019-09-07 ENCOUNTER — Ambulatory Visit (INDEPENDENT_AMBULATORY_CARE_PROVIDER_SITE_OTHER): Payer: PPO | Admitting: Ophthalmology

## 2019-09-07 DIAGNOSIS — H4302 Vitreous prolapse, left eye: Secondary | ICD-10-CM

## 2019-09-07 DIAGNOSIS — S0285XS Fracture of orbit, unspecified, sequela: Secondary | ICD-10-CM

## 2019-09-07 DIAGNOSIS — H3581 Retinal edema: Secondary | ICD-10-CM

## 2019-09-07 DIAGNOSIS — Z961 Presence of intraocular lens: Secondary | ICD-10-CM

## 2019-09-07 DIAGNOSIS — S0592XS Unspecified injury of left eye and orbit, sequela: Secondary | ICD-10-CM

## 2019-09-07 DIAGNOSIS — H2102 Hyphema, left eye: Secondary | ICD-10-CM

## 2019-09-07 DIAGNOSIS — H401130 Primary open-angle glaucoma, bilateral, stage unspecified: Secondary | ICD-10-CM

## 2019-09-07 DIAGNOSIS — H4312 Vitreous hemorrhage, left eye: Secondary | ICD-10-CM

## 2019-09-09 LAB — EYE CULTURE
Culture: NO GROWTH
Gram Stain: NONE SEEN

## 2019-09-10 ENCOUNTER — Encounter (INDEPENDENT_AMBULATORY_CARE_PROVIDER_SITE_OTHER): Payer: Self-pay | Admitting: Ophthalmology

## 2019-09-11 NOTE — Progress Notes (Signed)
[facility] Note  09/13/2019     CHIEF COMPLAINT Patient presents for Retina Follow Up   HISTORY OF PRESENT ILLNESS: Brandon Burke is a 86 y.o. male who presents to the clinic today for:   HPI    Retina Follow Up    Patient presents with  Other.  In left eye.  Severity is severe.  Duration of 6 days.  Since onset it is gradually improving.  I, the attending physician,  performed the HPI with the patient and updated documentation appropriately.          Comments    6 day follow up Vit Heme OS- Patient thinks he is able to see a little more things with OS than last visit.  Denies any new problems. Patients wife takes care of all of his drops.  He is unsure what he is taking or how she puts them in his eyes.        Last edited by [name], MD on 09/13/2019  9:15 PM. (History)    pt states he feels like he is doing "good", feels like vision may be a little better, pt has an appt with Dr. Eulas Post on May 24   Referring physician: Lonia Skinner, MD [address] [place],  [place] [address]  HISTORICAL INFORMATION:   Selected notes from the MEDICAL RECORD NUMBER Referred by Dr. Gala Romney for B-scan LEE:  Ocular Hx- PMH-   CURRENT MEDICATIONS: Current Outpatient Medications (Ophthalmic Drugs)  Medication Sig  . bacitracin-polymyxin b (POLYSPORIN) ophthalmic ointment Place into the right eye 4 (four) times daily for 10 days. Place a 1/2 inch ribbon of ointment into the lower eyelid.  Marland Kitchen gatifloxacin (ZYMAXID) 0.5 % SOLN Place 1 drop into the right eye 4 (four) times daily.  . Hypromellose (ARTIFICIAL TEARS OP) Place 1 drop into both eyes 3 (three) times daily as needed (for dryness).   Marland Kitchen ofloxacin (OCUFLOX) 0.3 % ophthalmic solution Place 1 drop into the left eye 4 (four) times daily.  . prednisoLONE acetate (PRED FORTE) 1 % ophthalmic suspension Place 1 drop into the left eye 4 (four) times daily.  . timolol (TIMOPTIC) 0.5 %  ophthalmic solution Place 1 drop into both eyes 2 (two) times daily.   . Travoprost, BAK Free, (TRAVATAN Z) 0.004 % SOLN ophthalmic solution Place 1 drop into both eyes at bedtime.   No current facility-administered medications for this visit. (Ophthalmic Drugs)   Current Outpatient Medications (Other)  Medication Sig  . acetaminophen (TYLENOL) 500 MG tablet Take 1,000 mg by mouth every 6 (six) hours as needed for mild pain or headache.  Marland Kitchen aspirin EC 81 MG tablet Take 81 mg by mouth daily.  Marland Kitchen atenolol (TENORMIN) 50 MG tablet Take 1 tablet (50 mg total) by mouth daily.  Marland Kitchen azelastine (ASTELIN) 0.1 % nasal spray Place 2 sprays into both nostrils at bedtime as needed for rhinitis or allergies. (Patient taking differently: Place 1 spray into both nostrils at bedtime. )  . calcium carbonate (TUMS - DOSED IN MG ELEMENTAL CALCIUM) 500 MG chewable tablet Chew 1-2 tablets by mouth as needed for indigestion or heartburn.  . diphenhydrAMINE (BENADRYL) 25 MG tablet Take 25 mg by mouth daily as needed for allergies.  . diphenhydramine-acetaminophen (TYLENOL PM) 25-500 MG TABS Take 1 tablet by mouth at bedtime as needed (for sleep).   . docusate sodium (COLACE) 100 MG capsule Take 100 mg by mouth daily as needed  for mild constipation.  . fluocinonide (LIDEX) 0.05 % external solution Apply 1 application topically See admin instructions. Apply to the scalp as directed at bedtime  . fluticasone (FLONASE) 50 MCG/ACT nasal spray Place 2 sprays into both nostrils daily as needed for allergies or rhinitis.  . folic acid (FOLVITE) A999333 MCG tablet Take 400 mcg by mouth at bedtime.   . Multiple Vitamins-Minerals (MULTIVITAMIN,TX-MINERALS) tablet Take 1 tablet by mouth at bedtime.   . NEOMYCIN-POLYMYXIN EX Place 1 application into both eyes in the morning, at noon, and at bedtime.  . NON FORMULARY Take 1-2 capsules by mouth See admin instructions. [place] capsules: Take 1 capsule by mouth once a day, alternating with 2  capsules every other day  . ondansetron (ZOFRAN) 4 MG tablet Take 1 tablet (4 mg total) by mouth every 8 (eight) hours as needed for nausea or vomiting.  . polyethylene glycol (MIRALAX / GLYCOLAX) packet Take 17 g by mouth daily. [place]  . pravastatin (PRAVACHOL) 40 MG tablet Take 1 tablet (40 mg total) by mouth at bedtime.  . Probiotic Product (ALIGN) 4 MG CAPS Take 4 mg by mouth daily.  . traMADol (ULTRAM) 50 MG tablet Take 1 tablet (50 mg total) by mouth 2 (two) times daily as needed.   No current facility-administered medications for this visit. (Other)      REVIEW OF SYSTEMS: ROS    Positive for: Gastrointestinal, Musculoskeletal, Cardiovascular, Eyes   Negative for: Constitutional, Neurological, Skin, Genitourinary, HENT, Endocrine, Respiratory, Psychiatric, Allergic/Imm, Heme/Lymph   Last edited by Leonie Douglas, COA on 09/13/2019  9:53 AM. (History)       ALLERGIES Allergies  Allergen Reactions  . Nitrofuran Derivatives Other (See Comments)    Caused headaches   . Sulfonamide Derivatives Nausea And Vomiting    PAST MEDICAL HISTORY Past Medical History:  Diagnosis Date  . Constipation   . DVT (deep venous thrombosis) ([place])    hx of 2001 (after prostate surgery)    . Glaucoma   . Hyperlipidemia   . Hypertension   . Melanoma ([place])    face, hand  . Mitral valve prolapse   . PE (pulmonary embolism)    5-09:was referred to hematology, and they recommend to discontinue Coumadin 5/11  . Prostate cancer [facility])     released from urology in 2010,needs yearly PSAs with PCP  . Seasonal allergies    Past Surgical History:  Procedure Laterality Date  . CATARACT EXTRACTION Bilateral   . COLONOSCOPY     several  . EYE SURGERY Bilateral    Cat Sx  . PARS PLANA VITRECTOMY Left 09/06/2019   Procedure: PARS PLANA VITRECTOMY WITH 25 GAUGE WITH INTRAVITREAL ANTIBIOTICS;  Surgeon: [name], MD;  Location: [facility];  Service: Ophthalmology;  Laterality: Left;  .  PHOTOCOAGULATION WITH LASER Left 09/06/2019   Procedure: Photocoagulation With Laser;  Surgeon: [name], MD;  Location: [facility];  Service: Ophthalmology;  Laterality: Left;  . PROSTATECTOMY  2000  . RUPTURED GLOBE EXPLORATION AND REPAIR Left 08/16/2019   Procedure: OPEN GLOBE EXPLORATION EYE POSSIBLE ANTERIOR  CHAMBER [place] OUT;  Surgeon: Lonia Skinner, MD;  Location: [facility];  Service: Ophthalmology;  Laterality: Left;    FAMILY HISTORY Family History  Problem Relation Age of Onset  . CAD Father 34  . Hypertension Father   . Glaucoma Mother   . Diabetes Neg Hx   . Colon cancer Neg Hx   . Prostate cancer Neg Hx     SOCIAL  HISTORY Social History   Tobacco Use  . Smoking status: Never Smoker  . Smokeless tobacco: Never Used  Substance Use Topics  . Alcohol use: No  . Drug use: No         OPHTHALMIC EXAM:  Base Eye Exam    Visual Acuity (Snellen - Linear)      Right Left   Dist [place] 20/50 CF 1'   Dist ph [place]  able to see light on chart  OS HM @3 ', CF @1 ' OS PH able to see the light from the chart       Tonometry (Tonopen, 10:01 AM)      Right Left   Pressure 14 8       Pupils      Dark Light Shape React   Right 3 2 Round Brisk   Left   Dilated        Visual Fields      Left Right     Full  OS can see hand moving in all 4 quad, unable to see fingers.       Extraocular Movement      Right Left    Full Abnormal    -- -- --  --  --  -- -- --   -- -3 --  -3  -3  -- -3 --         Neuro/Psych    Oriented x3: Yes   Mood/Affect: Normal       Dilation    Left eye: 1.0% Mydriacyl, 2.5% Phenylephrine @ 10:01 AM        Slit Lamp and Fundus Exam    External Exam      Right Left   External  Enophthalmos       Slit Lamp Exam      Right Left   Lids/Lashes Dermatochalasis - upper lid, Meibomian gland dysfunction Ptosis, lagophthalmos, incomplete blink, lash ptosis, ptosis   Conjunctiva/Sclera White and quiet mild [place]; sutures intact   Cornea  Arcus, central haze/crocodile shegreen  Arcus, central haze/crocodile shagreen , 4+ Punctate epithelial erosions, irregular epi, +ointment   Anterior Chamber Deep and quiet Deep, blood stained vitreous prolapse temporally -- improved   Iris Round and dilated Round and dilated, severely atrophic   Lens PC IOL in good position hazy view, PC IOL in good position   Vitreous Vitreous syneresis hazy view, post vitrectomy       Fundus Exam      Right Left   Disc  Pink and Sharp, mild pallor   C/D Ratio 0.6 0.6   Macula  Flat, Good foveal reflex, mild Retinal pigment epithelial mottling   Vessels  Vascular attenuation   Periphery  Attached             IMAGING AND PROCEDURES  Imaging and Procedures for @TODAY @  OCT, Retina - OU - Both Eyes       Right Eye Quality was good. Central Foveal Thickness: 311. Progression has been stable. Findings include normal foveal contour, no IRF, no SRF.   Notes *Images captured and stored on drive  Diagnosis / Impression:  OD: NFP, no IRF/SRF OS: no image  Clinical management:  See below  Abbreviations: NFP - Normal foveal profile. CME - cystoid macular edema. PED - pigment epithelial detachment. IRF - intraretinal fluid. SRF - subretinal fluid. EZ - ellipsoid zone. ERM - epiretinal membrane. ORA - outer retinal atrophy. ORT - outer retinal tubulation. SRHM - subretinal hyper-reflective material  ASSESSMENT/PLAN:    ICD-10-CM   1. Left eye injury, sequela  S05.92XS   2. Hyphema of left eye  H21.02   3. Vitreous prolapse of left eye  H43.02   4. Retinal edema  H35.81 OCT, Retina - OU - Both Eyes  5. Closed fracture of orbit, sequela ([place])  S02.85XS   6. Pseudophakia of both eyes  Z96.1   7. Primary open angle glaucoma of both eyes, unspecified glaucoma stage  H40.1130   8. Vitreous hemorrhage of left eye (HCC)  H43.12     1-6. Eye trauma with orbital fractures, hyphema, vitreous prolapse and vitreous hemorrhage  OS  - mechanism of injury -- fall on 04.08.21  - under the expert management of Dr. Eulas Post  - underwent exploration with Dr. Eulas Post on 04.15.21  - no globe rupture  - initially had elevated IOPs in the setting of history of POAG managed by Dr. Jerline Pain  - IOP stably improved today  - significant debris and old heme persisted in the [facility] and vitreous cavity  - now POW1 s/p PPV w/ EL and intravitreal abx OS, 05.06.21             - intra-op -- retina in good shape without RT/RD, +restricted EOM  - vitreous cultures for fungus and bacteria 05.06.21 -- no growth             - retina attached with vitreous clear  - exam today shows +enophthalmos, persistent EOM restriction; +lagophthalmos w/ incomplete blink  - recommend eval with oculoplastics -- discussed with Dr. Eulas Post who will see pt and coordinate care             - IOP good at 8             - cont PF 4x/day OS                          zymaxid QID OS                         PSO ung QID OS              - eye shield when sleeping              - post op drop and positioning instructions reviewed              - tylenol/ibuprofen for pain   - f/u 3-4 weeks, DFE, OCT  7. History of POAG  8. Pseudophakia OU  - s/p CE/IOL OU  - IOLs in good position   Ophthalmic Meds Ordered this visit:  Meds ordered this encounter  Medications  . prednisoLONE acetate (PRED FORTE) 1 % ophthalmic suspension    Sig: Place 1 drop into the left eye 4 (four) times daily.    Dispense:  15 mL    Refill:  0  . bacitracin-polymyxin b (POLYSPORIN) ophthalmic ointment    Sig: Place into the right eye 4 (four) times daily for 10 days. Place a 1/2 inch ribbon of ointment into the lower eyelid.    Dispense:  3.5 g    Refill:  3  . gatifloxacin (ZYMAXID) 0.5 % SOLN    Sig: Place 1 drop into the right eye 4 (four) times daily.    Dispense:  2.5 mL    Refill:  0       Return for f/u 3-4 weeks, s/p PPV for VH OD,  DFE, OCT.  There are no Patient Instructions on file  for this visit.   Explained the diagnoses, plan, and follow up with the patient and they expressed understanding.  Patient expressed understanding of the importance of proper follow up care.   This document serves as a record of services personally performed by Gardiner Sleeper, MD, PhD. It was created on their behalf by Leeann Must, [place], a certified ophthalmic assistant. The creation of this record is the provider's dictation and/or activities during the visit.    Electronically signed by: Leeann Must, COA @TODAY @ 9:23 PM   This document serves as a record of services personally performed by Gardiner Sleeper, MD, PhD. It was created on their behalf by Ernest Mallick, OA, an ophthalmic assistant. The creation of this record is the provider's dictation and/or activities during the visit.    Electronically signed by: Ernest Mallick, OA 05.13.2021 9:23 PM  Gardiner Sleeper, M.D., Ph.D. Diseases & Surgery of the Retina and Vitreous [facility]  I have reviewed the above documentation for accuracy and completeness, and I agree with the above. Gardiner Sleeper, M.D., Ph.D. 09/13/19 9:23 PM    Abbreviations: M myopia (nearsighted); A astigmatism; H hyperopia (farsighted); P presbyopia; Mrx spectacle prescription;  CTL contact lenses; OD right eye; OS left eye; OU both eyes  XT exotropia; ET esotropia; PEK punctate epithelial keratitis; PEE punctate epithelial erosions; DES dry eye syndrome; MGD meibomian gland dysfunction; ATs artificial tears; PFAT's preservative free artificial tears; [place] nuclear sclerotic cataract; PSC posterior subcapsular cataract; ERM epi-retinal membrane; PVD posterior vitreous detachment; RD retinal detachment; DM diabetes mellitus; DR diabetic retinopathy; NPDR non-proliferative diabetic retinopathy; PDR proliferative diabetic retinopathy; CSME clinically significant macular edema; DME diabetic macular edema; dbh dot blot hemorrhages; CWS cotton wool  spot; POAG primary open angle glaucoma; C/D cup-to-disc ratio; HVF humphrey visual field; GVF goldmann visual field; OCT optical coherence tomography; IOP intraocular pressure; BRVO Branch retinal vein occlusion; CRVO central retinal vein occlusion; CRAO central retinal artery occlusion; BRAO branch retinal artery occlusion; RT retinal tear; SB scleral buckle; PPV pars plana vitrectomy; VH Vitreous hemorrhage; PRP panretinal laser photocoagulation; IVK intravitreal kenalog; VMT vitreomacular traction; MH Macular hole;  NVD neovascularization of the disc; NVE neovascularization elsewhere; AREDS age related eye disease study; ARMD age related macular degeneration; POAG primary open angle glaucoma; EBMD epithelial/anterior basement membrane dystrophy; ACIOL anterior chamber intraocular lens; IOL intraocular lens; PCIOL posterior chamber intraocular lens; Phaco/IOL phacoemulsification with intraocular lens placement; [place] photorefractive keratectomy; LASIK laser assisted in situ keratomileusis; HTN hypertension; DM diabetes mellitus; COPD chronic obstructive pulmonary disease

## 2019-09-13 ENCOUNTER — Ambulatory Visit (INDEPENDENT_AMBULATORY_CARE_PROVIDER_SITE_OTHER): Payer: PPO | Admitting: Ophthalmology

## 2019-09-13 ENCOUNTER — Other Ambulatory Visit: Payer: Self-pay

## 2019-09-13 ENCOUNTER — Encounter (INDEPENDENT_AMBULATORY_CARE_PROVIDER_SITE_OTHER): Payer: Self-pay | Admitting: Ophthalmology

## 2019-09-13 DIAGNOSIS — H3581 Retinal edema: Secondary | ICD-10-CM

## 2019-09-13 DIAGNOSIS — H4312 Vitreous hemorrhage, left eye: Secondary | ICD-10-CM

## 2019-09-13 DIAGNOSIS — S0592XS Unspecified injury of left eye and orbit, sequela: Secondary | ICD-10-CM

## 2019-09-13 DIAGNOSIS — H2102 Hyphema, left eye: Secondary | ICD-10-CM

## 2019-09-13 DIAGNOSIS — S0285XS Fracture of orbit, unspecified, sequela: Secondary | ICD-10-CM

## 2019-09-13 DIAGNOSIS — H4302 Vitreous prolapse, left eye: Secondary | ICD-10-CM

## 2019-09-13 DIAGNOSIS — H401130 Primary open-angle glaucoma, bilateral, stage unspecified: Secondary | ICD-10-CM

## 2019-09-13 DIAGNOSIS — Z961 Presence of intraocular lens: Secondary | ICD-10-CM

## 2019-09-13 MED ORDER — GATIFLOXACIN 0.5 % OP SOLN: 1.0000 [drp] | Freq: Four times a day (QID) | OPHTHALMIC | 0 refills | Status: DC

## 2019-09-13 MED ORDER — PREDNISOLONE ACETATE 1 % OP SUSP: 1.0000 [drp] | Freq: Four times a day (QID) | OPHTHALMIC | 0 refills | Status: DC

## 2019-09-13 MED ORDER — BACITRACIN-POLYMYXIN B 500-10000 UNIT/GM OP OINT: TOPICAL_OINTMENT | Freq: Four times a day (QID) | OPHTHALMIC | 3 refills | Status: AC

## 2019-09-17 ENCOUNTER — Other Ambulatory Visit: Payer: Self-pay

## 2019-09-17 ENCOUNTER — Ambulatory Visit (HOSPITAL_BASED_OUTPATIENT_CLINIC_OR_DEPARTMENT_OTHER)
Admission: RE | Admit: 2019-09-17 | Discharge: 2019-09-17 | Disposition: A | Discharge status: Home / Self Care | Payer: PPO | Source: Ambulatory Visit | Attending: Internal Medicine | Admitting: Internal Medicine

## 2019-09-17 DIAGNOSIS — R918 Other nonspecific abnormal finding of lung field: Secondary | ICD-10-CM | POA: Diagnosis not present

## 2019-09-19 DIAGNOSIS — M6281 Muscle weakness (generalized): Secondary | ICD-10-CM | POA: Diagnosis not present

## 2019-09-19 DIAGNOSIS — S02842D Fracture of lateral orbital wall, left side, subsequent encounter for fracture with routine healing: Secondary | ICD-10-CM | POA: Diagnosis not present

## 2019-09-19 DIAGNOSIS — Z8546 Personal history of malignant neoplasm of prostate: Secondary | ICD-10-CM | POA: Diagnosis not present

## 2019-09-19 DIAGNOSIS — R2681 Unsteadiness on feet: Secondary | ICD-10-CM | POA: Diagnosis not present

## 2019-09-19 DIAGNOSIS — S02849S Fracture of lateral orbital wall, unspecified side, sequela: Secondary | ICD-10-CM | POA: Diagnosis not present

## 2019-09-19 DIAGNOSIS — Z9181 History of falling: Secondary | ICD-10-CM | POA: Diagnosis not present

## 2019-09-20 ENCOUNTER — Other Ambulatory Visit: Payer: Self-pay

## 2019-09-20 DIAGNOSIS — R918 Other nonspecific abnormal finding of lung field: Secondary | ICD-10-CM

## 2019-09-25 ENCOUNTER — Ambulatory Visit: Payer: PPO | Admitting: Surgical

## 2019-10-03 NOTE — Progress Notes (Addendum)
[facility] Note  10/08/2019     CHIEF COMPLAINT Patient presents for Post-op Follow-up   HISTORY OF PRESENT ILLNESS: Brandon Burke is a 86 y.o. male who presents to the clinic today for:   HPI    Post-op Follow-up    In left eye.  Discomfort includes pain.  Vision is improved.  I, the attending physician,  performed the HPI with the patient and updated documentation appropriately.          Comments    Pt states his vision seems to be improving OS and states he is beginning to have more sensation in face and less sensation in his teeth.  Patient states left eye has been hurting when it dries out.  Patient denies any new or worsening floaters or fol OU.       Last edited by [name], MD on 10/08/2019 11:02 PM. (History)    pt states he is doing better, pts next appt with Dr. Eulas Post is this Thursday, he also has an appt with Dr. Leonard Schwartz in July, he states Dr. Eulas Post has added Azopt to his drop regimen, he is states some of the numbness is leaving his face and some of his teeth are not as sensative, pt states he wears a patch over his left eye when he sleeps and occasionally during the day bc it keeps the air out of it, he states if air gets in it, the eye hurts   Referring physician: Lonia Skinner, MD [address],  [place] [id number]  HISTORICAL INFORMATION:   Selected notes from the MEDICAL RECORD NUMBER Referred by Dr. Gala Romney for B-scan LEE:  Ocular Hx- PMH-   CURRENT MEDICATIONS: Current Outpatient Medications (Ophthalmic Drugs)  Medication Sig   gatifloxacin (ZYMAXID) 0.5 % SOLN Place 1 drop into the right eye 4 (four) times daily.   Hypromellose (ARTIFICIAL TEARS OP) Place 1 drop into both eyes 3 (three) times daily as needed (for dryness).    ofloxacin (OCUFLOX) 0.3 % ophthalmic solution Place 1 drop into the left eye 4 (four) times daily.   prednisoLONE acetate (PRED FORTE) 1 % ophthalmic suspension  Place 1 drop into the left eye 4 (four) times daily.   timolol (TIMOPTIC) 0.5 % ophthalmic solution Place 1 drop into both eyes 2 (two) times daily.    Travoprost, BAK Free, (TRAVATAN Z) 0.004 % SOLN ophthalmic solution Place 1 drop into both eyes at bedtime.   No current facility-administered medications for this visit. (Ophthalmic Drugs)   Current Outpatient Medications (Other)  Medication Sig   acetaminophen (TYLENOL) 500 MG tablet Take 1,000 mg by mouth every 6 (six) hours as needed for mild pain or headache.   aspirin EC 81 MG tablet Take 81 mg by mouth daily.   atenolol (TENORMIN) 50 MG tablet Take 1 tablet (50 mg total) by mouth daily.   azelastine (ASTELIN) 0.1 % nasal spray Place 2 sprays into both nostrils at bedtime as needed for rhinitis or allergies. (Patient taking differently: Place 1 spray into both nostrils at bedtime. )   calcium carbonate (TUMS - DOSED IN MG ELEMENTAL CALCIUM) 500 MG chewable tablet Chew 1-2 tablets by mouth as needed for indigestion or heartburn.   diphenhydrAMINE (BENADRYL) 25 MG tablet Take 25 mg by mouth daily as needed for allergies.   diphenhydramine-acetaminophen (TYLENOL PM) 25-500 MG TABS Take 1 tablet by mouth at bedtime as needed (for sleep).    docusate  sodium (COLACE) 100 MG capsule Take 100 mg by mouth daily as needed for mild constipation.   fluocinonide (LIDEX) 0.05 % external solution Apply 1 application topically See admin instructions. Apply to the scalp as directed at bedtime   fluticasone (FLONASE) 50 MCG/ACT nasal spray Place 2 sprays into both nostrils daily as needed for allergies or rhinitis.   folic acid (FOLVITE) A999333 MCG tablet Take 400 mcg by mouth at bedtime.    Multiple Vitamins-Minerals (MULTIVITAMIN,TX-MINERALS) tablet Take 1 tablet by mouth at bedtime.    NEOMYCIN-POLYMYXIN EX Place 1 application into both eyes in the morning, at noon, and at bedtime.   NON FORMULARY Take 1-2 capsules by mouth See admin  instructions. [place] capsules: Take 1 capsule by mouth once a day, alternating with 2 capsules every other day   ondansetron (ZOFRAN) 4 MG tablet Take 1 tablet (4 mg total) by mouth every 8 (eight) hours as needed for nausea or vomiting.   polyethylene glycol (MIRALAX / GLYCOLAX) packet Take 17 g by mouth daily. MIX AND DRINK   pravastatin (PRAVACHOL) 40 MG tablet Take 1 tablet (40 mg total) by mouth at bedtime.   Probiotic Product (ALIGN) 4 MG CAPS Take 4 mg by mouth daily.   traMADol (ULTRAM) 50 MG tablet Take 1 tablet (50 mg total) by mouth 2 (two) times daily as needed.   No current facility-administered medications for this visit. (Other)      REVIEW OF SYSTEMS: ROS    Positive for: Gastrointestinal, Musculoskeletal, Cardiovascular, Eyes   Negative for: Constitutional, Neurological, Skin, Genitourinary, HENT, Endocrine, Respiratory, Psychiatric, Allergic/Imm, Heme/Lymph   Last edited by Doneen Poisson on 10/08/2019  8:37 AM. (History)       ALLERGIES Allergies  Allergen Reactions   Nitrofuran Derivatives Other (See Comments)    Caused headaches    Sulfonamide Derivatives Nausea And Vomiting    PAST MEDICAL HISTORY Past Medical History:  Diagnosis Date   Constipation    DVT (deep venous thrombosis) ([place])    hx of 2001 (after prostate surgery)     Glaucoma    Hyperlipidemia    Hypertension    Melanoma ([place])    face, hand   Mitral valve prolapse    PE (pulmonary embolism)    5-09:was referred to hematology, and they recommend to discontinue Coumadin 5/11   Prostate cancer ([place])     released from urology in 2010,needs yearly PSAs with PCP   Seasonal allergies    Past Surgical History:  Procedure Laterality Date   CATARACT EXTRACTION Bilateral    COLONOSCOPY     several   EYE SURGERY Bilateral    Cat Sx   PARS PLANA VITRECTOMY Left 09/06/2019   Procedure: PARS PLANA VITRECTOMY WITH 25 GAUGE WITH INTRAVITREAL ANTIBIOTICS;  Surgeon: [name], MD;  Location: [place];  Service: Ophthalmology;  Laterality: Left;   PHOTOCOAGULATION WITH LASER Left 09/06/2019   Procedure: Photocoagulation With Laser;  Surgeon: [name], MD;  Location: [facility];  Service: Ophthalmology;  Laterality: Left;   PROSTATECTOMY  2000   RUPTURED GLOBE EXPLORATION AND REPAIR Left 08/16/2019   Procedure: OPEN GLOBE EXPLORATION EYE POSSIBLE ANTERIOR  CHAMBER [place] OUT;  Surgeon: Lonia Skinner, MD;  Location: [place];  Service: Ophthalmology;  Laterality: Left;    FAMILY HISTORY Family History  Problem Relation Age of Onset   CAD Father 48   Hypertension Father    Glaucoma Mother    Diabetes Neg Hx    Colon cancer Neg  Hx    Prostate cancer Neg Hx     SOCIAL HISTORY Social History   Tobacco Use   Smoking status: Never Smoker   Smokeless tobacco: Never Used  Substance Use Topics   Alcohol use: No   Drug use: No         OPHTHALMIC EXAM:  Base Eye Exam    Visual Acuity (Snellen - Linear)      Right Left   Dist cc 20/30 -1 CF @ face   Dist ph cc NI NI   Correction: Glasses       Tonometry (Tonopen, 8:41 AM)      Right Left   Pressure 13 06       Pupils      Dark Light Shape React APD   Right 4 3 Round Brisk 0   Left Dilated Dilated Round NR 0       Visual Fields      Left Right     Full  Left eye: CF @ face       Extraocular Movement      Right Left    Full Abnormal    0 0 0  0  0  0 0 0   -3 -3 -3  -3  -3  -3 -3 -3         Neuro/Psych    Oriented x3: Yes   Mood/Affect: Normal       Dilation    Both eyes: 1.0% Mydriacyl, 2.5% Phenylephrine @ 8:41 AM        Slit Lamp and Fundus Exam    External Exam      Right Left   External  Enophthalmos       Slit Lamp Exam      Right Left   Lids/Lashes Dermatochalasis - upper lid, Meibomian gland dysfunction Ptosis, lagophthalmos, incomplete blink, lash ptosis   Conjunctiva/Sclera White and quiet mild [place]; sutures intact   Cornea Arcus, central  haze/crocodile shegreen  Arcus, central haze/crocodile shagreen , 1-2+ Punctate epithelial erosions, irregular epi with thinning inferiorly, +ointment   Anterior Chamber Deep and quiet Deep, blood stained vitreous prolapse temporally -- improved   Iris Round and dilated Round and dilated, severely atrophic   Lens PC IOL in good position hazy view, PC IOL in good position   Vitreous Vitreous syneresis hazy view, post vitrectomy       Fundus Exam      Right Left   Disc Pink and Sharp mild pallor, +cupping, very thin superior rim   C/D Ratio 0.6 0.85   Macula Flat, Blunted foveal reflex, mild RPE mottling and clumping, No heme or edema Flat, Good foveal reflex, mild Retinal pigment epithelial mottling, No heme or edema   Vessels Vascular attenuation, Tortuous Vascular attenuation   Periphery Attached, mild reticular degeneration Attached           Refraction    Wearing Rx      Sphere Cylinder Axis Add   Right -1.75 +1.00 167 +2.50   Left -1.50 +2.25 177 +2.50   Type: Bifocal          IMAGING AND PROCEDURES  Imaging and Procedures for @TODAY @  OCT, Retina - OU - Both Eyes       Right Eye Quality was good. Central Foveal Thickness: 310. Progression has been stable. Findings include normal foveal contour, no IRF, no SRF.   Left Eye Quality was borderline. Findings include normal foveal contour, no SRF, no IRF (Mild central  ellipsoid thinning).   Notes *Images captured and stored on drive  Diagnosis / Impression:  OD: NFP, no IRF/SRF OS: NFP; no IRF/SRF; Mild central ellipsoid thinning  Clinical management:  See below  Abbreviations: NFP - Normal foveal profile. CME - cystoid macular edema. PED - pigment epithelial detachment. IRF - intraretinal fluid. SRF - subretinal fluid. EZ - ellipsoid zone. ERM - epiretinal membrane. ORA - outer retinal atrophy. ORT - outer retinal tubulation. SRHM - subretinal hyper-reflective material                 ASSESSMENT/PLAN:     ICD-10-CM   1. Left eye injury, sequela  S05.92XS   2. Hyphema of left eye  H21.02   3. Vitreous prolapse of left eye  H43.02   4. Retinal edema  H35.81 OCT, Retina - OU - Both Eyes  5. Closed fracture of orbit, sequela ([place])  S02.85XS   6. Vitreous hemorrhage of left eye (HCC)  H43.12   7. Primary open angle glaucoma of both eyes, unspecified glaucoma stage  H40.1130   8. Pseudophakia of both eyes  Z96.1     1-6. Eye trauma with orbital fractures, hyphema, vitreous prolapse and vitreous hemorrhage OS  - mechanism of injury -- fall on 04.08.21  - under the expert management of Dr. Eulas Post  - underwent exploration with Dr. Eulas Post on 04.15.21  - no globe rupture  - initially had elevated IOPs in the setting of history of POAG managed by Dr. Jerline Pain  - IOP stably improved   - significant debris and old heme persisted in the [facility] and vitreous cavity  - now POM1 s/p PPV w/ EL and intravitreal abx OS, 05.06.21             - intra-op -- retina in good shape without RT/RD, +restricted EOM  - vitreous cultures for fungus and bacteria 05.06.21 -- no growth             - retina attached with vitreous clear  - exam today shows +enophthalmos, persistent EOM restriction; +lagophthalmos w/ incomplete blink  - recommend eval with oculoplastics -- discussed with Dr. Eulas Post who has referred to Dr. Sabino Dick at [facility]             - IOP 06             - cont drops per Dr. Eulas Post:   PF 4x/day OS                          zymaxid QID OS                         PSO ung QID OS -- decreased to TID  - overall, retina stable  - f/u 3-4 months, sooner prn -- DFE, OCT  7. History of POAG  8. Pseudophakia OU  - s/p CE/IOL OU  - IOLs in good position   Ophthalmic Meds Ordered this visit:  No orders of the defined types were placed in this encounter.      Return for f/u 3-4 months, eye trauma OS, DFE, OCT.  There are no Patient Instructions on file for this visit.   Explained the diagnoses, plan, and follow  up with the patient and they expressed understanding.  Patient expressed understanding of the importance of proper follow up care.   This document serves as a record of services personally performed by Gardiner Sleeper, MD, PhD.  It was created on their behalf by Ernest Mallick, OA, an ophthalmic assistant. The creation of this record is the provider's dictation and/or activities during the visit.    Electronically signed by: Ernest Mallick, OA 06.07.2021 11:10 PM  Gardiner Sleeper, M.D., Ph.D. Diseases & Surgery of the Retina and [place] 10/08/2019    I have reviewed the above documentation for accuracy and completeness, and I agree with the above. Gardiner Sleeper, M.D., Ph.D. 10/08/19 11:10 PM   Abbreviations: M myopia (nearsighted); A astigmatism; H hyperopia (farsighted); P presbyopia; Mrx spectacle prescription;  CTL contact lenses; OD right eye; OS left eye; OU both eyes  XT exotropia; ET esotropia; PEK punctate epithelial keratitis; PEE punctate epithelial erosions; DES dry eye syndrome; MGD meibomian gland dysfunction; ATs artificial tears; PFAT's preservative free artificial tears; [place] nuclear sclerotic cataract; PSC posterior subcapsular cataract; ERM epi-retinal membrane; PVD posterior vitreous detachment; RD retinal detachment; DM diabetes mellitus; DR diabetic retinopathy; NPDR non-proliferative diabetic retinopathy; PDR proliferative diabetic retinopathy; CSME clinically significant macular edema; DME diabetic macular edema; dbh dot blot hemorrhages; CWS cotton wool spot; POAG primary open angle glaucoma; C/D cup-to-disc ratio; HVF humphrey visual field; GVF goldmann visual field; OCT optical coherence tomography; IOP intraocular pressure; BRVO Branch retinal vein occlusion; CRVO central retinal vein occlusion; CRAO central retinal artery occlusion; BRAO branch retinal artery occlusion; RT retinal tear; SB scleral buckle; PPV pars plana vitrectomy; VH  Vitreous hemorrhage; PRP panretinal laser photocoagulation; IVK intravitreal kenalog; VMT vitreomacular traction; MH Macular hole;  NVD neovascularization of the disc; NVE neovascularization elsewhere; AREDS age related eye disease study; ARMD age related macular degeneration; POAG primary open angle glaucoma; EBMD epithelial/anterior basement membrane dystrophy; ACIOL anterior chamber intraocular lens; IOL intraocular lens; PCIOL posterior chamber intraocular lens; Phaco/IOL phacoemulsification with intraocular lens placement; [place] photorefractive keratectomy; LASIK laser assisted in situ keratomileusis; HTN hypertension; DM diabetes mellitus; COPD chronic obstructive pulmonary disease

## 2019-10-04 DIAGNOSIS — H02204 Unspecified lagophthalmos left upper eyelid: Secondary | ICD-10-CM | POA: Diagnosis not present

## 2019-10-04 DIAGNOSIS — H16212 Exposure keratoconjunctivitis, left eye: Secondary | ICD-10-CM | POA: Diagnosis not present

## 2019-10-04 DIAGNOSIS — H4032X3 Glaucoma secondary to eye trauma, left eye, severe stage: Secondary | ICD-10-CM | POA: Diagnosis not present

## 2019-10-04 DIAGNOSIS — H4312 Vitreous hemorrhage, left eye: Secondary | ICD-10-CM | POA: Diagnosis not present

## 2019-10-04 LAB — FUNGUS CULTURE RESULT

## 2019-10-04 LAB — FUNGUS CULTURE WITH STAIN

## 2019-10-04 LAB — FUNGAL ORGANISM REFLEX

## 2019-10-05 ENCOUNTER — Other Ambulatory Visit: Payer: Self-pay

## 2019-10-05 ENCOUNTER — Ambulatory Visit: Payer: PPO | Admitting: *Deleted

## 2019-10-05 ENCOUNTER — Encounter: Payer: Self-pay | Admitting: Internal Medicine

## 2019-10-05 ENCOUNTER — Ambulatory Visit (INDEPENDENT_AMBULATORY_CARE_PROVIDER_SITE_OTHER): Payer: PPO | Admitting: Internal Medicine

## 2019-10-05 VITALS — BP 134/67 | HR 74 | Temp 97.0°F | Resp 16 | Ht 72.0 in | Wt 150.5 lb

## 2019-10-05 DIAGNOSIS — W19XXXD Unspecified fall, subsequent encounter: Secondary | ICD-10-CM

## 2019-10-05 DIAGNOSIS — Z0000 Encounter for general adult medical examination without abnormal findings: Secondary | ICD-10-CM

## 2019-10-05 DIAGNOSIS — E785 Hyperlipidemia, unspecified: Secondary | ICD-10-CM

## 2019-10-05 DIAGNOSIS — R531 Weakness: Secondary | ICD-10-CM | POA: Diagnosis not present

## 2019-10-05 DIAGNOSIS — Z8546 Personal history of malignant neoplasm of prostate: Secondary | ICD-10-CM

## 2019-10-05 DIAGNOSIS — R0600 Dyspnea, unspecified: Secondary | ICD-10-CM | POA: Diagnosis not present

## 2019-10-05 DIAGNOSIS — R0609 Other forms of dyspnea: Secondary | ICD-10-CM

## 2019-10-05 LAB — LIPID PANEL
Cholesterol: 164 mg/dL (ref 0–200)
HDL: 44.1 mg/dL (ref >39.00)
LDL Cholesterol: 87 mg/dL (ref 0–99)
NonHDL: 119.74
Total CHOL/HDL Ratio: 4
Triglycerides: 165 mg/dL — ABNORMAL HIGH (ref 0.0–149.0)
VLDL: 33 mg/dL (ref 0.0–40.0)

## 2019-10-05 LAB — AST: AST: 18 U/L (ref 0–37)

## 2019-10-05 LAB — ALT: ALT: 23 U/L (ref 0–53)

## 2019-10-05 LAB — TSH: TSH: 4.45 u[IU]/mL (ref 0.35–4.50)

## 2019-10-05 LAB — PSA: PSA: 0 ng/mL — ABNORMAL LOW (ref 0.10–4.00)

## 2019-10-05 NOTE — Progress Notes (Signed)
Pre visit review using our clinic review tool, if applicable. No additional management support is needed unless otherwise documented below in the visit note. 

## 2019-10-05 NOTE — Patient Instructions (Addendum)
Please schedule Medicare Wellness with Glenard Haring.   Please send [place] a copy of your healthcare power of attorney or MOST forms to be scanned in your chart.  In 2 weeks, walk in to the x-ray department downstairs and get a chest x-ray  GO TO THE LAB : Get the blood work     [place], [place] back for a checkup in 3 months

## 2019-10-05 NOTE — Progress Notes (Signed)
Subjective:    Patient ID: Brandon Burke, male    DOB: 01/11/1932, 86 y.o.   MRN: [id number]  DOS:  10/05/2019 Type of visit - description: Here for CPX, here with his wife.  Since last visit he continue with some concerns: Still weak since the fall April 2021. Appetite is decreased but his wife is "forcing" him to eat.  Has gained few pounds. Sometimes has night sweats but no fever or chills per se. He denies nausea, vomiting, diarrhea.  No postprandial abdominal pain.  No blood in the stools. Very little cough No blood in the urine Continue with chest soreness and occasional DOE, nevertheless he continue taking walks regularly without substernal chest pain.  Wt Readings from Last 3 Encounters:  10/05/19 150 lb 8 oz (68.3 kg)  09/06/19 145 lb (65.8 kg)  08/27/19 153 lb (69.4 kg)     Review of Systems See above   Past Medical History:  Diagnosis Date  . Constipation   . DVT (deep venous thrombosis) ([place])    hx of 2001 (after prostate surgery)    . Glaucoma   . Hyperlipidemia   . Hypertension   . Melanoma ([place])    face, hand  . Mitral valve prolapse   . PE (pulmonary embolism)    5-09:was referred to hematology, and they recommend to discontinue Coumadin 5/11  . Prostate cancer [facility])     released from urology in 2010,needs yearly PSAs with PCP  . Seasonal allergies     Past Surgical History:  Procedure Laterality Date  . CATARACT EXTRACTION Bilateral   . COLONOSCOPY     several  . EYE SURGERY Bilateral    Cat Sx  . PARS PLANA VITRECTOMY Left 09/06/2019   Procedure: PARS PLANA VITRECTOMY WITH 25 GAUGE WITH INTRAVITREAL ANTIBIOTICS;  Surgeon: [name], MD;  Location: [place];  Service: Ophthalmology;  Laterality: Left;  . PHOTOCOAGULATION WITH LASER Left 09/06/2019   Procedure: Photocoagulation With Laser;  Surgeon: [name], MD;  Location: [facility];  Service: Ophthalmology;  Laterality: Left;  . PROSTATECTOMY  2000  . RUPTURED GLOBE EXPLORATION AND REPAIR  Left 08/16/2019   Procedure: OPEN GLOBE EXPLORATION EYE POSSIBLE ANTERIOR  CHAMBER [place] OUT;  Surgeon: Lonia Skinner, MD;  Location: [facility];  Service: Ophthalmology;  Laterality: Left;   Family History  Problem Relation Age of Onset  . CAD Father 43  . Hypertension Father   . Glaucoma Mother   . Diabetes Neg Hx   . Colon cancer Neg Hx   . Prostate cancer Neg Hx      Allergies as of 10/05/2019      Reactions   Nitrofuran Derivatives Other (See Comments)   Caused headaches   Sulfonamide Derivatives Nausea And Vomiting      Medication List       Accurate as of October 05, 2019 11:59 PM. If you have any questions, ask your nurse or doctor.        acetaminophen 500 MG tablet Commonly known as: TYLENOL Take 1,000 mg by mouth every 6 (six) hours as needed for mild pain or headache.   Align 4 MG Caps Take 4 mg by mouth daily.   ARTIFICIAL TEARS OP Place 1 drop into both eyes 3 (three) times daily as needed (for dryness).   aspirin EC 81 MG tablet Take 81 mg by mouth daily.   atenolol 50 MG tablet Commonly known as: TENORMIN Take 1 tablet (50 mg total) by mouth daily.  azelastine 0.1 % nasal spray Commonly known as: ASTELIN Place 2 sprays into both nostrils at bedtime as needed for rhinitis or allergies. What changed:   how much to take  when to take this   calcium carbonate 500 MG chewable tablet Commonly known as: TUMS - dosed in mg elemental calcium Chew 1-2 tablets by mouth as needed for indigestion or heartburn.   diphenhydrAMINE 25 MG tablet Commonly known as: BENADRYL Take 25 mg by mouth daily as needed for allergies.   diphenhydramine-acetaminophen 25-500 MG Tabs tablet Commonly known as: TYLENOL PM Take 1 tablet by mouth at bedtime as needed (for sleep).   docusate sodium 100 MG capsule Commonly known as: COLACE Take 100 mg by mouth daily as needed for mild constipation.   fluocinonide 0.05 % external solution Commonly known as: LIDEX Apply 1  application topically See admin instructions. Apply to the scalp as directed at bedtime   fluticasone 50 MCG/ACT nasal spray Commonly known as: FLONASE Place 2 sprays into both nostrils daily as needed for allergies or rhinitis.   folic acid 244 MCG tablet Commonly known as: FOLVITE Take 400 mcg by mouth at bedtime.   gatifloxacin 0.5 % Soln Commonly known as: ZYMAXID Place 1 drop into the right eye 4 (four) times daily.   multivitamin,tx-minerals tablet Take 1 tablet by mouth at bedtime.   NEOMYCIN-POLYMYXIN EX Place 1 application into both eyes in the morning, at noon, and at bedtime.   NON FORMULARY Take 1-2 capsules by mouth See admin instructions. [place] capsules: Take 1 capsule by mouth once a day, alternating with 2 capsules every other day   ofloxacin 0.3 % ophthalmic solution Commonly known as: OCUFLOX Place 1 drop into the left eye 4 (four) times daily.   ondansetron 4 MG tablet Commonly known as: Zofran Take 1 tablet (4 mg total) by mouth every 8 (eight) hours as needed for nausea or vomiting.   polyethylene glycol 17 g packet Commonly known as: MIRALAX / GLYCOLAX Take 17 g by mouth daily. MIX AND DRINK   pravastatin 40 MG tablet Commonly known as: PRAVACHOL Take 1 tablet (40 mg total) by mouth at bedtime.   prednisoLONE acetate 1 % ophthalmic suspension Commonly known as: PRED FORTE Place 1 drop into the left eye 4 (four) times daily.   timolol 0.5 % ophthalmic solution Commonly known as: TIMOPTIC Place 1 drop into both eyes 2 (two) times daily.   traMADol 50 MG tablet Commonly known as: Ultram Take 1 tablet (50 mg total) by mouth 2 (two) times daily as needed.   Travatan Z 0.004 % Soln ophthalmic solution Generic drug: Travoprost (BAK Free) Place 1 drop into both eyes at bedtime.          Objective:   Physical Exam BP 134/67 (BP Location: Left Arm, Patient Position: Sitting, Cuff Size: Small)   Pulse 74   Temp (!) 97 F (36.1 C) (Temporal)    Resp 16   Ht 6' (1.829 m)   Wt 150 lb 8 oz (68.3 kg)   SpO2 100%   BMI 20.41 kg/m   General: Well developed, NAD, BMI noted Neck: No  thyromegaly  HEENT:  Normocephalic .  Lungs:  CTA B Normal respiratory effort, no intercostal retractions, no accessory muscle use. Heart: RRR,  no murmur.  Abdomen:  Not distended, soft, non-tender. No rebound or rigidity.   Lower extremities: no pretibial edema bilaterally  Skin: Exposed areas without rash. Not pale. Not jaundice Neurologic:  alert & oriented  X3.  Speech normal, gait appropriate for age and unassisted Strength symmetric and appropriate for age.  Psych: Cognition and judgment appear intact.  Cooperative with normal attention span and concentration.  Behavior appropriate. No anxious or depressed appearing.     Assessment      Assessment Hyperlipidemia Prostate cancer, released from urology 2010, Rx yearly PSAs by PCP HEM:DVT 2001 after surgery, PE 2009, eval by hematology, they recommend DC Coumadin 2011 H/o MVP-- on BB glaucoma Sees dermatology q 6 months  Facial FX 08-2019, lost L eye vision  PLAN Here for CPX Hyperlipidemia: Check FLP, AST, ALT Hematology:  H/o DVT-PE, not taking aspirin, recommend ASA 81 mg daily Facial fracture, still sees ophthalmology, has lost the vision of the left eye. Chest pain: Still has left sided chest soreness, due for a repeat chest x-ray in 2 weeks. C/o  weakness, + DOE, night sweats: After a major fall back in April I think is understandable for him to be weak.  He does have night sweats and abnormal chest x-ray but no fever chills, is gaining wt, and overall he seems a slightly better.  Does not have a substernal chest pain with exertion.  DOE is probably deconditioning.  Reassess in 3 months. RTC 3 months  Today, in addition to CPX we assessed other problems including hyperlipidemia, recent fall, weakness, DOE and night sweats.  This visit occurred during the SARS-CoV-2 public  health emergency.  Safety protocols were in place, including screening questions prior to the visit, additional usage of staff PPE, and extensive cleaning of exam room while observing appropriate contact time as indicated for disinfecting solutions.

## 2019-10-06 NOTE — Assessment & Plan Note (Signed)
--  Td  2013 - pneumonia shot2006, booster 08-2016; prevnar: 2016 - s/p zostavax;  S/p shingrex #1 and  #2 -s/p covid shots x 2  --CCS: scope 01-2005, had tics, pt electing no further screening   --S/P prostatectomy for prostate cancer around 2000, discharge from urology 2010, Rx yearly PSAs; get a  psa  today --  Advance directives: Does have documentation at home, encouraged to send it to [place] to be scanned --Labs: PSA, FLP, AST, ALT, TSH

## 2019-10-06 NOTE — Assessment & Plan Note (Signed)
Here for CPX Hyperlipidemia: Check FLP, AST, ALT Hematology:  H/o DVT-PE, not taking aspirin, recommend ASA 81 mg daily Facial fracture, still sees ophthalmology, has lost the vision of the left eye. Chest pain: Still has left sided chest soreness, due for a repeat chest x-ray in 2 weeks. C/o  weakness, + DOE, night sweats: After a major fall back in April I think is understandable for him to be weak.  He does have night sweats and abnormal chest x-ray but no fever chills, is gaining wt, and overall he seems a slightly better.  Does not have a substernal chest pain with exertion.  DOE is probably deconditioning.  Reassess in 3 months. RTC 3 months

## 2019-10-08 ENCOUNTER — Encounter (INDEPENDENT_AMBULATORY_CARE_PROVIDER_SITE_OTHER): Payer: Self-pay | Admitting: Ophthalmology

## 2019-10-08 ENCOUNTER — Ambulatory Visit (INDEPENDENT_AMBULATORY_CARE_PROVIDER_SITE_OTHER): Payer: PPO | Admitting: Ophthalmology

## 2019-10-08 ENCOUNTER — Other Ambulatory Visit: Payer: Self-pay

## 2019-10-08 DIAGNOSIS — H4302 Vitreous prolapse, left eye: Secondary | ICD-10-CM

## 2019-10-08 DIAGNOSIS — H2102 Hyphema, left eye: Secondary | ICD-10-CM

## 2019-10-08 DIAGNOSIS — Z961 Presence of intraocular lens: Secondary | ICD-10-CM

## 2019-10-08 DIAGNOSIS — H4312 Vitreous hemorrhage, left eye: Secondary | ICD-10-CM

## 2019-10-08 DIAGNOSIS — H3581 Retinal edema: Secondary | ICD-10-CM | POA: Diagnosis not present

## 2019-10-08 DIAGNOSIS — H401130 Primary open-angle glaucoma, bilateral, stage unspecified: Secondary | ICD-10-CM

## 2019-10-08 DIAGNOSIS — S0592XS Unspecified injury of left eye and orbit, sequela: Secondary | ICD-10-CM

## 2019-10-08 DIAGNOSIS — S0285XS Fracture of orbit, unspecified, sequela: Secondary | ICD-10-CM

## 2019-10-10 ENCOUNTER — Encounter: Payer: Medicare Other | Admitting: Internal Medicine

## 2019-10-19 ENCOUNTER — Ambulatory Visit (HOSPITAL_BASED_OUTPATIENT_CLINIC_OR_DEPARTMENT_OTHER)
Admission: RE | Admit: 2019-10-19 | Discharge: 2019-10-19 | Disposition: A | Discharge status: Home / Self Care | Payer: PPO | Source: Ambulatory Visit | Attending: Internal Medicine | Admitting: Internal Medicine

## 2019-10-19 ENCOUNTER — Other Ambulatory Visit: Payer: Self-pay

## 2019-10-19 DIAGNOSIS — J90 Pleural effusion, not elsewhere classified: Secondary | ICD-10-CM | POA: Diagnosis not present

## 2019-10-19 DIAGNOSIS — R9389 Abnormal findings on diagnostic imaging of other specified body structures: Secondary | ICD-10-CM | POA: Diagnosis not present

## 2019-10-19 DIAGNOSIS — R918 Other nonspecific abnormal finding of lung field: Secondary | ICD-10-CM | POA: Diagnosis not present

## 2019-10-21 ENCOUNTER — Emergency Department (HOSPITAL_COMMUNITY)
Admission: EM | Admit: 2019-10-21 | Discharge: 2019-10-21 | Disposition: A | Discharge status: Home / Self Care | Payer: PPO | Attending: Emergency Medicine | Admitting: Emergency Medicine

## 2019-10-21 ENCOUNTER — Emergency Department (HOSPITAL_BASED_OUTPATIENT_CLINIC_OR_DEPARTMENT_OTHER): Payer: PPO

## 2019-10-21 ENCOUNTER — Encounter (HOSPITAL_COMMUNITY): Payer: Self-pay

## 2019-10-21 ENCOUNTER — Other Ambulatory Visit: Payer: Self-pay

## 2019-10-21 DIAGNOSIS — I82462 Acute embolism and thrombosis of left calf muscular vein: Secondary | ICD-10-CM | POA: Diagnosis not present

## 2019-10-21 DIAGNOSIS — M79661 Pain in right lower leg: Secondary | ICD-10-CM | POA: Insufficient documentation

## 2019-10-21 DIAGNOSIS — Z8546 Personal history of malignant neoplasm of prostate: Secondary | ICD-10-CM | POA: Diagnosis not present

## 2019-10-21 DIAGNOSIS — Z86718 Personal history of other venous thrombosis and embolism: Secondary | ICD-10-CM

## 2019-10-21 DIAGNOSIS — Z7982 Long term (current) use of aspirin: Secondary | ICD-10-CM | POA: Diagnosis not present

## 2019-10-21 DIAGNOSIS — I10 Essential (primary) hypertension: Secondary | ICD-10-CM | POA: Insufficient documentation

## 2019-10-21 DIAGNOSIS — M79609 Pain in unspecified limb: Secondary | ICD-10-CM | POA: Diagnosis not present

## 2019-10-21 DIAGNOSIS — Z79899 Other long term (current) drug therapy: Secondary | ICD-10-CM | POA: Diagnosis not present

## 2019-10-21 DIAGNOSIS — I82461 Acute embolism and thrombosis of right calf muscular vein: Secondary | ICD-10-CM

## 2019-10-21 MED ORDER — APIXABAN (ELIQUIS) VTE STARTER PACK (10MG AND 5MG)
ORAL_TABLET | ORAL | 0 refills | Status: DC
Start: 2019-10-21 — End: 2019-11-15

## 2019-10-21 MED ORDER — APIXABAN 5 MG PO TABS: 5.0000 mg | ORAL_TABLET | Freq: Two times a day (BID) | ORAL | Status: DC

## 2019-10-21 MED ORDER — APIXABAN 5 MG PO TABS
10.0000 mg | ORAL_TABLET | Freq: Two times a day (BID) | ORAL | Status: DC
  Administered 2019-10-21: 10 mg via ORAL
  Filled 2019-10-21 (×2): qty 2

## 2019-10-21 NOTE — ED Provider Notes (Signed)
[facility] EMERGENCY DEPARTMENT Provider Note   CSN: [id number] Arrival date & time: 10/21/19  3818     History No chief complaint on file.   Brandon Burke is a 86 y.o. male.  86 year old male presents with pain to his right calf since yesterday.  Has a history of multiple DVTs and this feels similar.  Denies any chest pain or shortness of breath.  No distal numbness or tingling to his right foot.  Pain is characterizes sharp and persistent and nothing makes it better.  No treatment use prior to arrival.        Past Medical History:  Diagnosis Date  . Constipation   . DVT (deep venous thrombosis) ([place])    hx of 2001 (after prostate surgery)    . Glaucoma   . Hyperlipidemia   . Hypertension   . Melanoma ([place])    face, hand  . Mitral valve prolapse   . PE (pulmonary embolism)    5-09:was referred to hematology, and they recommend to discontinue Coumadin 5/11  . Prostate cancer [facility])     released from urology in 2010,needs yearly PSAs with PCP  . Seasonal allergies     Patient Active Problem List   Diagnosis Date Noted  . Fall   . Complex laceration of face   . Orbital floor fracture ([place])   . Facial fracture ([place]) 08/09/2019  . PCP NOTES >>>>>>>>>>>>>>> 10/09/2015  . Allergic rhinitis 02/26/2014  . Headache(784.0) 03/09/2012  . Annual physical exam 06/22/2011  . Osteoarthritis 12/15/2009  . Insomnia 06/02/2009  . History of pulmonary embolism 09/22/2007  . DVT, HX OF 09/13/2007  . Dyslipidemia 10/27/2006  . PROSTATE CANCER, HX OF 10/27/2006  . MVP (mitral valve prolapse) 10/05/2006    Past Surgical History:  Procedure Laterality Date  . CATARACT EXTRACTION Bilateral   . COLONOSCOPY     several  . EYE SURGERY Bilateral    Cat Sx  . PARS PLANA VITRECTOMY Left 09/06/2019   Procedure: PARS PLANA VITRECTOMY WITH 25 GAUGE WITH INTRAVITREAL ANTIBIOTICS;  Surgeon: [name], MD;  Location: [place];  Service: Ophthalmology;  Laterality:  Left;  . PHOTOCOAGULATION WITH LASER Left 09/06/2019   Procedure: Photocoagulation With Laser;  Surgeon: [name], MD;  Location: [place];  Service: Ophthalmology;  Laterality: Left;  . PROSTATECTOMY  2000  . RUPTURED GLOBE EXPLORATION AND REPAIR Left 08/16/2019   Procedure: OPEN GLOBE EXPLORATION EYE POSSIBLE ANTERIOR  CHAMBER [place] OUT;  Surgeon: Lonia Skinner, MD;  Location: [place];  Service: Ophthalmology;  Laterality: Left;       Family History  Problem Relation Age of Onset  . CAD Father 46  . Hypertension Father   . Glaucoma Mother   . Diabetes Neg Hx   . Colon cancer Neg Hx   . Prostate cancer Neg Hx     Social History   Tobacco Use  . Smoking status: Never Smoker  . Smokeless tobacco: Never Used  Vaping Use  . Vaping Use: Never used  Substance Use Topics  . Alcohol use: No  . Drug use: No    Home Medications Prior to Admission medications   Medication Sig Start Date End Date Taking? Authorizing Provider  acetaminophen (TYLENOL) 500 MG tablet Take 1,000 mg by mouth every 6 (six) hours as needed for mild pain or headache.    [provider]  aspirin EC 81 MG tablet Take 81 mg by mouth daily.    [provider]  atenolol (TENORMIN) 50 MG tablet Take 1 tablet (50 mg total) by mouth daily. 07/26/19   Colon Branch, MD  azelastine (ASTELIN) 0.1 % nasal spray Place 2 sprays into both nostrils at bedtime as needed for rhinitis or allergies. Patient taking differently: Place 1 spray into both nostrils at bedtime.  06/30/18   Colon Branch, MD  calcium carbonate (TUMS - DOSED IN MG ELEMENTAL CALCIUM) 500 MG chewable tablet Chew 1-2 tablets by mouth as needed for indigestion or heartburn.    [provider]  diphenhydrAMINE (BENADRYL) 25 MG tablet Take 25 mg by mouth daily as needed for allergies.    [provider]  diphenhydramine-acetaminophen (TYLENOL PM) 25-500 MG TABS Take 1 tablet by mouth at bedtime as needed (for sleep).     [provider]  docusate sodium (COLACE) 100 MG capsule Take 100 mg by mouth daily as needed for mild constipation.    [provider]  fluocinonide (LIDEX) 0.05 % external solution Apply 1 application topically See admin instructions. Apply to the scalp as directed at bedtime    [provider]  fluticasone (FLONASE) 50 MCG/ACT nasal spray Place 2 sprays into both nostrils daily as needed for allergies or rhinitis. 08/08/19   Colon Branch, MD  folic acid (FOLVITE) 570 MCG tablet Take 400 mcg by mouth at bedtime.     [provider]  gatifloxacin (ZYMAXID) 0.5 % SOLN Place 1 drop into the right eye 4 (four) times daily. 09/13/19   [name], MD  Hypromellose (ARTIFICIAL TEARS OP) Place 1 drop into both eyes 3 (three) times daily as needed (for dryness).     [provider]  Multiple Vitamins-Minerals (MULTIVITAMIN,TX-MINERALS) tablet Take 1 tablet by mouth at bedtime.     [provider]  NEOMYCIN-POLYMYXIN EX Place 1 application into both eyes in the morning, at noon, and at bedtime.    [provider]  NON FORMULARY Take 1-2 capsules by mouth See admin instructions. Moringa capsules: Take 1 capsule by mouth once a day, alternating with 2 capsules every other day    [provider]  ofloxacin (OCUFLOX) 0.3 % ophthalmic solution Place 1 drop into the left eye 4 (four) times daily.    [provider]  ondansetron (ZOFRAN) 4 MG tablet Take 1 tablet (4 mg total) by mouth every 8 (eight) hours as needed for nausea or vomiting. 08/11/19 08/10/20  Aline August, MD  polyethylene glycol (MIRALAX / GLYCOLAX) packet Take 17 g by mouth daily. [place]    [provider]  pravastatin (PRAVACHOL) 40 MG tablet Take 1 tablet (40 mg total) by mouth at bedtime. 08/11/19   Aline August, MD  prednisoLONE acetate (PRED FORTE) 1 % ophthalmic suspension Place 1 drop into the left eye 4 (four) times daily. 09/13/19   [name], MD    Probiotic Product (ALIGN) 4 MG CAPS Take 4 mg by mouth daily.    [provider]  timolol (TIMOPTIC) 0.5 % ophthalmic solution Place 1 drop into both eyes 2 (two) times daily.  06/10/17   [provider]  traMADol (ULTRAM) 50 MG tablet Take 1 tablet (50 mg total) by mouth 2 (two) times daily as needed. 08/27/19   Colon Branch, MD  Travoprost, BAK Free, (TRAVATAN Z) 0.004 % SOLN ophthalmic solution Place 1 drop into both eyes at bedtime.    [provider]    Allergies    Nitrofuran derivatives and Sulfonamide derivatives  Review  of Systems   Review of Systems  All other systems reviewed and are negative.   Physical Exam Updated Vital Signs BP (!) 156/74 (BP Location: Left Arm)   Pulse 71   Temp 98.9 F (37.2 C) (Oral)   Resp 16   SpO2 99%   Physical Exam Vitals and nursing note reviewed.  Constitutional:      General: He is not in acute distress.    Appearance: Normal appearance. He is well-developed. He is not toxic-appearing.  HENT:     Head: Normocephalic and atraumatic.  Eyes:     General: Lids are normal.     Conjunctiva/sclera: Conjunctivae normal.     Pupils: Pupils are equal, round, and reactive to light.  Neck:     Thyroid: No thyroid mass.     Trachea: No tracheal deviation.  Cardiovascular:     Rate and Rhythm: Normal rate and regular rhythm.     Heart sounds: Normal heart sounds. No murmur heard.  No gallop.   Pulmonary:     Effort: Pulmonary effort is normal. No respiratory distress.     Breath sounds: Normal breath sounds. No stridor. No decreased breath sounds, wheezing, rhonchi or rales.  Abdominal:     General: Bowel sounds are normal. There is no distension.     Palpations: Abdomen is soft.     Tenderness: There is no abdominal tenderness. There is no rebound.  Musculoskeletal:        General: No tenderness. Normal range of motion.     Cervical back: Normal range of motion and neck supple.       Legs:     Comments:  Neurovascular intact right foot  Skin:    General: Skin is warm and dry.     Findings: No abrasion or rash.  Neurological:     Mental Status: He is alert and oriented to person, place, and time.     GCS: GCS eye subscore is 4. GCS verbal subscore is 5. GCS motor subscore is 6.     Cranial Nerves: No cranial nerve deficit.     Sensory: No sensory deficit.  Psychiatric:        Speech: Speech normal.        Behavior: Behavior normal.     ED Results / Procedures / Treatments   Labs (all labs ordered are listed, but only abnormal results are displayed) Labs Reviewed - No data to display  EKG None  Radiology DG Chest 2 View  Result Date: 10/19/2019 CLINICAL DATA:  Follow-up abnormal chest x-ray EXAM: CHEST - 2 VIEW COMPARISON:  09/17/2019, 08/27/2019 FINDINGS: Similar small pleural effusions or pleural thickening. Persistent somewhat nodular opacity at the right lung base. Stable cardiomediastinal silhouette. No pneumothorax. IMPRESSION: Persistent slightly nodular opacity at the right lung base. Given lack of interval resolution, recommend chest CT for further evaluation. Electronically Signed   By: Donavan Foil M.D.   On: 10/19/2019 23:03   VAS [place] LOWER EXTREMITY VENOUS (DVT) (ONLY [facility] & [facility])  Result Date: 10/21/2019  Lower Venous DVTStudy Indications: Pain, and personal history of DVT.  Risk Factors: Had recent eye surgery and had to stop taking aspirin. Comparison Study: Prior study from 11/29/16 is available for comparison Performing Technologist: Sharion Dove RVS  Examination Guidelines: A complete evaluation includes B-mode imaging, spectral Doppler, color Doppler, and power Doppler as needed of all accessible portions of each vessel. Bilateral testing is considered an integral part of a complete examination. Limited examinations for reoccurring indications  may be performed as noted. The reflux portion of the exam is performed with the patient in reverse Trendelenburg.   +---------+---------------+---------+-----------+----------+--------------+ RIGHT    CompressibilityPhasicitySpontaneityPropertiesThrombus Aging +---------+---------------+---------+-----------+----------+--------------+ CFV      Full           Yes      No                                  +---------+---------------+---------+-----------+----------+--------------+ SFJ      Full                                                        +---------+---------------+---------+-----------+----------+--------------+ FV Prox  Full                                                        +---------+---------------+---------+-----------+----------+--------------+ FV Mid   Full                                                        +---------+---------------+---------+-----------+----------+--------------+ FV DistalFull                                                        +---------+---------------+---------+-----------+----------+--------------+ PFV      Full                                                        +---------+---------------+---------+-----------+----------+--------------+ POP      Partial        Yes      No                   acute/mobile   +---------+---------------+---------+-----------+----------+--------------+ PTV      Partial                                      Acute          +---------+---------------+---------+-----------+----------+--------------+ PERO     None                                         Acute          +---------+---------------+---------+-----------+----------+--------------+ Gastroc  Full                                                        +---------+---------------+---------+-----------+----------+--------------+   +----+---------------+---------+-----------+----------+--------------+  LEFTCompressibilityPhasicitySpontaneityPropertiesThrombus Aging  +----+---------------+---------+-----------+----------+--------------+ CFV Full           Yes      Yes                                 +----+---------------+---------+-----------+----------+--------------+     Summary: RIGHT: - Findings consistent with acute deep vein thrombosis involving the right popliteal vein, right posterior tibial veins, and right peroneal veins.  LEFT: - No evidence of common femoral vein obstruction.  *See table(s) above for measurements and observations.    Preliminary     Procedures Procedures (including critical care time)  Medications Ordered in ED Medications  apixaban (ELIQUIS) tablet 10 mg (has no administration in time range)    Followed by  apixaban (ELIQUIS) tablet 5 mg (has no administration in time range)    ED Course  I have reviewed the triage vital signs and the nursing notes.  Pertinent labs & imaging results that were available during my care of the patient were reviewed by me and considered in my medical decision making (see chart for details).    MDM Rules/Calculators/A&P                          Patient is Doppler consistent with right calf DVT.  Have consulted pharmacy and will place patient on Eliquis and patient will need to follow-up with his doctor next week Final Clinical Impression(s) / ED Diagnoses Final diagnoses:  None    Rx / DC Orders ED Discharge Orders    None       Lacretia Leigh, MD 10/21/19 1156

## 2019-10-21 NOTE — Progress Notes (Addendum)
VASCULAR LAB    Right lower extremity venous duplex completed.    Preliminary report:  See CV proc for preliminary results.  Gave report to Glade Nurse, RN  Nicodemus Denk, RVT 10/21/2019, 10:51 AM

## 2019-10-21 NOTE — ED Triage Notes (Signed)
Patient complains of right calf pain with minimal reported swelling x 1 day. Takes aspirin daily and has history of multiple DVTS. No SOB, nad

## 2019-10-21 NOTE — Discharge Instructions (Addendum)
Follow-up with your doctor on Monday

## 2019-10-22 ENCOUNTER — Telehealth: Payer: Self-pay | Admitting: Internal Medicine

## 2019-10-22 DIAGNOSIS — H4032X3 Glaucoma secondary to eye trauma, left eye, severe stage: Secondary | ICD-10-CM | POA: Diagnosis not present

## 2019-10-22 DIAGNOSIS — H16212 Exposure keratoconjunctivitis, left eye: Secondary | ICD-10-CM | POA: Diagnosis not present

## 2019-10-22 DIAGNOSIS — H02204 Unspecified lagophthalmos left upper eyelid: Secondary | ICD-10-CM | POA: Diagnosis not present

## 2019-10-22 DIAGNOSIS — H02205 Unspecified lagophthalmos left lower eyelid: Secondary | ICD-10-CM | POA: Diagnosis not present

## 2019-10-22 MED ORDER — APIXABAN 5 MG PO TABS: 5.0000 mg | ORAL_TABLET | Freq: Two times a day (BID) | ORAL | 1 refills | Status: DC

## 2019-10-22 NOTE — Telephone Encounter (Signed)
Caller: Camar Call back phone number:[phone] Cell phone [phone]  Patient states he when to the hospital Friday was diagnoses with blood clot. Was told to call his primary doctor to obtained a prescription for Eliquis to the pharmacy.  [facility] [address], [place] - [address]  [address] [place], [place] [address]  Phone:  [phone] Fax:  [phone]

## 2019-10-22 NOTE — Telephone Encounter (Signed)
Please advise 

## 2019-10-22 NOTE — Telephone Encounter (Signed)
Patient diagnosed with a below-knee DVT, to my knowledge he was prescribed a Eliquis starting package. That should last 4 weeks. Recommend to proceed with that schedule a visit in 2 to 3 weeks.

## 2019-10-22 NOTE — Telephone Encounter (Signed)
Okay, send the prescription for Eliquis 5 mg B.I.D. #60 and 1 refill to be started 11/01/2019

## 2019-10-22 NOTE — Telephone Encounter (Signed)
Rx sent 

## 2019-10-22 NOTE — Addendum Note (Signed)
Addended byDamita Dunnings D on: 10/22/2019 01:12 PM   Modules accepted: Orders

## 2019-10-22 NOTE — Telephone Encounter (Signed)
Spoke w/ Pt- informed that starter pack was sent to Brandon Burke by ED yesterday. Appt scheduled for 11/15/2019.

## 2019-10-22 NOTE — Telephone Encounter (Signed)
His prescription was writing as follow   2 -5 mg tablet 2 twice a day for 7 days 1- 5 mg table twice a day  Was given 42 pills... therefore he be out of medication 11/01/19.  Please advise

## 2019-10-23 ENCOUNTER — Other Ambulatory Visit: Payer: Self-pay

## 2019-10-23 DIAGNOSIS — R9389 Abnormal findings on diagnostic imaging of other specified body structures: Secondary | ICD-10-CM

## 2019-10-23 DIAGNOSIS — R918 Other nonspecific abnormal finding of lung field: Secondary | ICD-10-CM

## 2019-10-24 ENCOUNTER — Telehealth: Payer: Self-pay | Admitting: Internal Medicine

## 2019-10-24 NOTE — Telephone Encounter (Addendum)
error 

## 2019-10-25 ENCOUNTER — Other Ambulatory Visit: Payer: Self-pay | Admitting: Internal Medicine

## 2019-10-25 NOTE — Telephone Encounter (Signed)
Patient calling in regards to prescription status. Patient is requesting that Larose Kells send in a new prescription. Patient states current prescription is expired.  Patient states that he also has a discount card for medication.

## 2019-10-25 NOTE — Telephone Encounter (Signed)
Rx sent 

## 2019-10-31 ENCOUNTER — Ambulatory Visit (HOSPITAL_BASED_OUTPATIENT_CLINIC_OR_DEPARTMENT_OTHER)
Admission: RE | Admit: 2019-10-31 | Discharge: 2019-10-31 | Disposition: A | Discharge status: Home / Self Care | Payer: PPO | Source: Ambulatory Visit | Attending: Internal Medicine | Admitting: Internal Medicine

## 2019-10-31 ENCOUNTER — Other Ambulatory Visit: Payer: Self-pay

## 2019-10-31 DIAGNOSIS — R918 Other nonspecific abnormal finding of lung field: Secondary | ICD-10-CM | POA: Diagnosis not present

## 2019-10-31 DIAGNOSIS — I2510 Atherosclerotic heart disease of native coronary artery without angina pectoris: Secondary | ICD-10-CM | POA: Diagnosis not present

## 2019-10-31 DIAGNOSIS — J189 Pneumonia, unspecified organism: Secondary | ICD-10-CM | POA: Diagnosis not present

## 2019-10-31 DIAGNOSIS — I700 Atherosclerosis of aorta: Secondary | ICD-10-CM | POA: Diagnosis not present

## 2019-10-31 DIAGNOSIS — J984 Other disorders of lung: Secondary | ICD-10-CM | POA: Diagnosis not present

## 2019-11-02 DIAGNOSIS — S02832S Fracture of medial orbital wall, left side, sequela: Secondary | ICD-10-CM | POA: Diagnosis not present

## 2019-11-02 DIAGNOSIS — S0232XA Fracture of orbital floor, left side, initial encounter for closed fracture: Secondary | ICD-10-CM | POA: Diagnosis not present

## 2019-11-02 DIAGNOSIS — H05429 Enophthalmos due to trauma or surgery, unspecified eye: Secondary | ICD-10-CM | POA: Diagnosis not present

## 2019-11-02 DIAGNOSIS — H4032X3 Glaucoma secondary to eye trauma, left eye, severe stage: Secondary | ICD-10-CM | POA: Diagnosis not present

## 2019-11-02 DIAGNOSIS — H16212 Exposure keratoconjunctivitis, left eye: Secondary | ICD-10-CM | POA: Diagnosis not present

## 2019-11-02 DIAGNOSIS — H02234 Paralytic lagophthalmos left upper eyelid: Secondary | ICD-10-CM | POA: Diagnosis not present

## 2019-11-02 DIAGNOSIS — H02055 Trichiasis without entropian left lower eyelid: Secondary | ICD-10-CM | POA: Diagnosis not present

## 2019-11-02 DIAGNOSIS — H05422 Enophthalmos due to trauma or surgery, left eye: Secondary | ICD-10-CM | POA: Diagnosis not present

## 2019-11-02 NOTE — Addendum Note (Signed)
Addended byDamita Dunnings D on: 11/02/2019 07:35 AM   Modules accepted: Orders

## 2019-11-08 ENCOUNTER — Telehealth: Payer: Self-pay

## 2019-11-08 NOTE — Telephone Encounter (Signed)
Please advise 

## 2019-11-08 NOTE — Telephone Encounter (Signed)
Pt cannot get in to see Dr. Melvyn Novas until 01/01/20.  Who else would you recommend pt seeing at [facility]?

## 2019-11-09 NOTE — Telephone Encounter (Signed)
Yes, ok to see  another [facility] pulmonology provider

## 2019-11-09 NOTE — Telephone Encounter (Signed)
Spoke w/ Pt- informed to contact pulm office to see who could see him sooner. Pt verbalized understanding.

## 2019-11-15 ENCOUNTER — Telehealth: Payer: Self-pay | Admitting: Internal Medicine

## 2019-11-15 ENCOUNTER — Other Ambulatory Visit: Payer: Self-pay

## 2019-11-15 ENCOUNTER — Ambulatory Visit (INDEPENDENT_AMBULATORY_CARE_PROVIDER_SITE_OTHER): Payer: PPO | Admitting: Internal Medicine

## 2019-11-15 VITALS — BP 120/78 | HR 70 | Temp 97.8°F | Resp 16 | Ht 72.0 in | Wt 157.0 lb

## 2019-11-15 DIAGNOSIS — R531 Weakness: Secondary | ICD-10-CM | POA: Diagnosis not present

## 2019-11-15 DIAGNOSIS — R918 Other nonspecific abnormal finding of lung field: Secondary | ICD-10-CM

## 2019-11-15 DIAGNOSIS — I824Y1 Acute embolism and thrombosis of unspecified deep veins of right proximal lower extremity: Secondary | ICD-10-CM | POA: Diagnosis not present

## 2019-11-15 NOTE — Telephone Encounter (Signed)
Patient states he has cancel his eye surgery on 08/04. He would see you on 09/03.

## 2019-11-15 NOTE — Telephone Encounter (Signed)
Patient states that he saw Dr Larose Kells this morning. They discuss his eye surgery on August 4, Patient states that he can not remember what said about getting surgery .Patient is on Eliquis.  Please Advise

## 2019-11-15 NOTE — Telephone Encounter (Signed)
Please remind the patient: Recommended not to stop anticoagulation  (due to recent DVT)   unless he needs a procedure that can not be postponed. I also advised patient to ask his ophthalmologist if he needs to stop anticoagulation for the eye procedure, he might not need to.

## 2019-11-15 NOTE — Progress Notes (Signed)
Subjective:    Patient ID: Brandon Burke, male    DOB: Jun 24, 1931, 86 y.o.   MRN: [id number]  DOS:  11/15/2019 Type of visit - description: Follow-up  Went to the ER 10/21/2019, had right calf pain, similar to previous DVTs.  Ultrasound:  RIGHT:  - Findings consistent with acute deep vein thrombosis involving the right  popliteal vein, right posterior tibial veins, and right peroneal veins.    Started on anticoagulation.  Review of Systems Denies chest pain, difficulty breathing or palpitations. Since last visit, still feeling weak and some DOE. Since last visit, night sweats stopped.  Past Medical History:  Diagnosis Date  . Constipation   . DVT (deep venous thrombosis) ([place])    hx of 2001 (after prostate surgery)    . Glaucoma   . Hyperlipidemia   . Hypertension   . Melanoma ([place])    face, hand  . Mitral valve prolapse   . PE (pulmonary embolism)    5-09:was referred to hematology, and they recommend to discontinue Coumadin 5/11  . Prostate cancer [facility])     released from urology in 2010,needs yearly PSAs with PCP  . Seasonal allergies     Past Surgical History:  Procedure Laterality Date  . CATARACT EXTRACTION Bilateral   . COLONOSCOPY     several  . EYE SURGERY Bilateral    Cat Sx  . PARS PLANA VITRECTOMY Left 09/06/2019   Procedure: PARS PLANA VITRECTOMY WITH 25 GAUGE WITH INTRAVITREAL ANTIBIOTICS;  Surgeon: [name], MD;  Location: [place];  Service: Ophthalmology;  Laterality: Left;  . PHOTOCOAGULATION WITH LASER Left 09/06/2019   Procedure: Photocoagulation With Laser;  Surgeon: [name], MD;  Location: [place];  Service: Ophthalmology;  Laterality: Left;  . PROSTATECTOMY  2000  . RUPTURED GLOBE EXPLORATION AND REPAIR Left 08/16/2019   Procedure: OPEN GLOBE EXPLORATION EYE POSSIBLE ANTERIOR  CHAMBER [place] OUT;  Surgeon: Lonia Skinner, MD;  Location: [place];  Service: Ophthalmology;  Laterality: Left;    Allergies as of 11/15/2019      Reactions    Nitrofuran Derivatives Other (See Comments)   Caused headaches   Sulfonamide Derivatives Nausea And Vomiting      Medication List       Accurate as of November 15, 2019 11:59 PM. If you have any questions, ask your nurse or doctor.        STOP taking these medications   prednisoLONE acetate 1 % ophthalmic suspension Commonly known as: PRED FORTE Stopped by: [name], MD   timolol 0.5 % ophthalmic solution Commonly known as: TIMOPTIC Stopped by: [name], MD     TAKE these medications   acetaminophen 500 MG tablet Commonly known as: TYLENOL Take 1,000 mg by mouth every 6 (six) hours as needed for mild pain or headache.   Align 4 MG Caps Take 4 mg by mouth daily.   apixaban 5 MG Tabs tablet Commonly known as: Eliquis Take 1 tablet (5 mg total) by mouth 2 (two) times daily. To start on 11/01/2019 after he finishes started pack What changed: Another medication with the same name was removed. Continue taking this medication, and follow the directions you see here. Changed by: [name], MD   ARTIFICIAL TEARS OP Place 1 drop into both eyes 3 (three) times daily as needed (for dryness).   atenolol 50 MG tablet Commonly known as: TENORMIN Take 1 tablet (50 mg total) by mouth daily.   azelastine 0.1 % nasal spray Commonly known  as: ASTELIN Place 2 sprays into both nostrils at bedtime as needed for rhinitis or allergies.   calcium carbonate 500 MG chewable tablet Commonly known as: TUMS - dosed in mg elemental calcium Chew 1-2 tablets by mouth as needed for indigestion or heartburn.   diphenhydrAMINE 25 MG tablet Commonly known as: BENADRYL Take 25 mg by mouth daily as needed for allergies.   diphenhydramine-acetaminophen 25-500 MG Tabs tablet Commonly known as: TYLENOL PM Take 1 tablet by mouth at bedtime as needed (for sleep).   docusate sodium 100 MG capsule Commonly known as: COLACE Take 100 mg by mouth daily as needed for mild constipation.   dorzolamide-timolol  22.3-6.8 MG/ML ophthalmic solution Commonly known as: COSOPT   fluocinonide 0.05 % external solution Commonly known as: LIDEX Apply 1 application topically See admin instructions. Apply to the scalp as directed at bedtime   fluticasone 50 MCG/ACT nasal spray Commonly known as: FLONASE Place 2 sprays into both nostrils daily as needed for allergies or rhinitis.   folic acid 024 MCG tablet Commonly known as: FOLVITE Take 400 mcg by mouth at bedtime.   gatifloxacin 0.5 % Soln Commonly known as: ZYMAXID Place 1 drop into the right eye 4 (four) times daily.   multivitamin,tx-minerals tablet Take 1 tablet by mouth at bedtime.   NEOMYCIN-POLYMYXIN EX Place 1 application into both eyes in the morning, at noon, and at bedtime.   NON FORMULARY Take 1-2 capsules by mouth See admin instructions. [place] capsules: Take 1 capsule by mouth once a day, alternating with 2 capsules every other day   ofloxacin 0.3 % ophthalmic solution Commonly known as: OCUFLOX Place 1 drop into the left eye 4 (four) times daily.   ondansetron 4 MG tablet Commonly known as: Zofran Take 1 tablet (4 mg total) by mouth every 8 (eight) hours as needed for nausea or vomiting.   polyethylene glycol 17 g packet Commonly known as: MIRALAX / GLYCOLAX Take 17 g by mouth daily. MIX AND DRINK   pravastatin 40 MG tablet Commonly known as: PRAVACHOL Take 1 tablet (40 mg total) by mouth at bedtime.   traMADol 50 MG tablet Commonly known as: Ultram Take 1 tablet (50 mg total) by mouth 2 (two) times daily as needed.   Travatan Z 0.004 % Soln ophthalmic solution Generic drug: Travoprost (BAK Free) Place 1 drop into both eyes at bedtime.          Objective:   Physical Exam BP 120/78 (BP Location: Left Arm, Patient Position: Sitting, Cuff Size: Small)   Pulse 70   Temp 97.8 F (36.6 C) (Oral)   Resp 16   Ht 6' (1.829 m)   Wt 157 lb (71.2 kg)   SpO2 97%   BMI 21.29 kg/m  General:   Well developed, NAD,  BMI noted. HEENT:  Normocephalic . Face symmetric, atraumatic Lower extremities: No pitting edema @ LEs however right calf is larger in  conference by 1 inch when measured, calf is not TTP.  No red or warm. Skin: Not pale. Not jaundice Neurologic:  alert & oriented X3.  Speech normal, gait appropriate for age and unassisted Psych--  Cognition and judgment appear intact.  Cooperative with normal attention span and concentration.  Behavior appropriate. No anxious or depressed appearing.      Assessment     Assessment Hyperlipidemia Prostate cancer, released from urology 2010, Rx yearly PSAs by PCP HEM: DVT 2001 after surgery; PE 2009, eval by hematology, they recommend DC Coumadin 2011.  Spontaneous  right leg DVT 11/2019 H/o MVP-- on BB glaucoma Sees dermatology q 6 months  Facial FX 08-2019, lost L eye vision  PLAN Acute right leg leg DVT: Although the patient is somewhat sedentary, he is not  bed ridden, no recent car trips, thus likely an unprovoked DVT.  This would be his third clot. Plan: Continue with Eliquis most likely indefinitely, currently not on aspirin, watch for signs of bleeding. C/o  weakness, + DOE, night sweats: The last visit, sxs about the same except the night sweats have resolved Facial fracture: In need of procedure on the left eye, from my standpoint I recommend not to stop Eliquis at least for the next 3 months (unless surgery is imperative).  Long discussion about anticoagulation management. CT chest opacity: To see pulmonary RTC already scheduled in about 3 months.    This visit occurred during the SARS-CoV-2 public health emergency.  Safety protocols were in place, including screening questions prior to the visit, additional usage of staff PPE, and extensive cleaning of exam room while observing appropriate contact time as indicated for disinfecting solutions.

## 2019-11-15 NOTE — Telephone Encounter (Signed)
Definitely recommend not to stop anticoagulation unless is a procedure that  cannot be postponed

## 2019-11-15 NOTE — Telephone Encounter (Signed)
[place],[facility] [phone]   Dr. Is Ok's with patient staying on eliquis while having surgery if Larose Kells is ok with it.    Please Advise

## 2019-11-16 DIAGNOSIS — L600 Ingrowing nail: Secondary | ICD-10-CM | POA: Diagnosis not present

## 2019-11-16 DIAGNOSIS — Q6689 Other  specified congenital deformities of feet: Secondary | ICD-10-CM | POA: Diagnosis not present

## 2019-11-16 DIAGNOSIS — M79671 Pain in right foot: Secondary | ICD-10-CM | POA: Diagnosis not present

## 2019-11-16 DIAGNOSIS — L84 Corns and callosities: Secondary | ICD-10-CM | POA: Diagnosis not present

## 2019-11-16 DIAGNOSIS — L602 Onychogryphosis: Secondary | ICD-10-CM | POA: Diagnosis not present

## 2019-11-16 DIAGNOSIS — M79672 Pain in left foot: Secondary | ICD-10-CM | POA: Diagnosis not present

## 2019-11-16 NOTE — Telephone Encounter (Signed)
Per other telephone note- Pt has cancelled surgery.

## 2019-11-17 ENCOUNTER — Encounter: Payer: Self-pay | Admitting: Internal Medicine

## 2019-11-17 NOTE — Assessment & Plan Note (Signed)
Acute right leg leg DVT: Although the patient is somewhat sedentary, he is not  bed ridden, no recent car trips, thus likely an unprovoked DVT.  This would be his third clot. Plan: Continue with Eliquis most likely indefinitely, currently not on aspirin, watch for signs of bleeding. C/o  weakness, + DOE, night sweats: The last visit, sxs about the same except the night sweats have resolved Facial fracture: In need of procedure on the left eye, from my standpoint I recommend not to stop Eliquis at least for the next 3 months (unless surgery is imperative).  Long discussion about anticoagulation management. CT chest opacity: To see pulmonary RTC already scheduled in about 3 months.

## 2019-11-26 DIAGNOSIS — H02204 Unspecified lagophthalmos left upper eyelid: Secondary | ICD-10-CM | POA: Diagnosis not present

## 2019-11-26 DIAGNOSIS — H02205 Unspecified lagophthalmos left lower eyelid: Secondary | ICD-10-CM | POA: Diagnosis not present

## 2019-11-26 DIAGNOSIS — H401111 Primary open-angle glaucoma, right eye, mild stage: Secondary | ICD-10-CM | POA: Diagnosis not present

## 2019-11-26 DIAGNOSIS — H4032X3 Glaucoma secondary to eye trauma, left eye, severe stage: Secondary | ICD-10-CM | POA: Diagnosis not present

## 2019-11-26 DIAGNOSIS — H16212 Exposure keratoconjunctivitis, left eye: Secondary | ICD-10-CM | POA: Diagnosis not present

## 2019-12-06 ENCOUNTER — Other Ambulatory Visit: Payer: Self-pay

## 2019-12-06 MED ORDER — ATENOLOL 50 MG PO TABS: 50.0000 mg | ORAL_TABLET | Freq: Every day | ORAL | 1 refills | Status: DC

## 2019-12-10 DIAGNOSIS — L570 Actinic keratosis: Secondary | ICD-10-CM | POA: Diagnosis not present

## 2019-12-10 DIAGNOSIS — Z85828 Personal history of other malignant neoplasm of skin: Secondary | ICD-10-CM | POA: Diagnosis not present

## 2019-12-10 DIAGNOSIS — L820 Inflamed seborrheic keratosis: Secondary | ICD-10-CM | POA: Diagnosis not present

## 2019-12-10 DIAGNOSIS — D1801 Hemangioma of skin and subcutaneous tissue: Secondary | ICD-10-CM | POA: Diagnosis not present

## 2019-12-10 DIAGNOSIS — L812 Freckles: Secondary | ICD-10-CM | POA: Diagnosis not present

## 2019-12-10 DIAGNOSIS — L821 Other seborrheic keratosis: Secondary | ICD-10-CM | POA: Diagnosis not present

## 2019-12-22 ENCOUNTER — Encounter: Payer: Self-pay | Admitting: Internal Medicine

## 2019-12-24 MED ORDER — APIXABAN 5 MG PO TABS: 5.0000 mg | ORAL_TABLET | Freq: Two times a day (BID) | ORAL | 1 refills | Status: DC

## 2019-12-26 ENCOUNTER — Ambulatory Visit: Payer: PPO | Admitting: Emergency Medicine

## 2019-12-26 ENCOUNTER — Other Ambulatory Visit: Payer: Self-pay

## 2019-12-26 ENCOUNTER — Encounter: Payer: Self-pay | Admitting: Emergency Medicine

## 2019-12-26 DIAGNOSIS — R911 Solitary pulmonary nodule: Secondary | ICD-10-CM | POA: Diagnosis not present

## 2019-12-26 NOTE — Patient Instructions (Signed)
We will arrange for repeat CT scan of your chest at the end of September to compare to your priors. Depending on the CT scan results we will decide whether other imaging or other testing will be helpful to evaluate your pulmonary nodule. Follow Dr. Lamonte Sakai next available after your CT scan so that we can review the results together.

## 2019-12-26 NOTE — Progress Notes (Signed)
Subjective:    Patient ID: Brandon Burke, male    DOB: [birth year], 86 y.o.   MRN: [id number]  HPI 86 year old never smoker with a history of hypertension, prostate cancer, DVT/pulmonary embolism with recurrent DVT 11/2019, seasonal allergies.  He is here for evaluation of an abnormal CT scan of the chest. He suffered a fall and contusion of his left chest in early April.  Serial chest x-rays subsequent to that showed a R basilar  that ultimately prompted a CT scan of the chest 10/31/2019 which I have reviewed.  This shows a somewhat wedge-shaped subpleural area of consolidation in the lateral segment of the right middle lobe 2.8 x 2.2 cm with some additional cluster centrilobular nodules superiorly in the lateral segment. He was having cough and some chest pain after the fall, has now improved some.    Review of Systems As per HPI  Past Medical History:  Diagnosis Date  . Constipation   . DVT (deep venous thrombosis) ([place])    hx of 2001 (after prostate surgery)    . Glaucoma   . Hyperlipidemia   . Hypertension   . Melanoma ([place])    face, hand  . Mitral valve prolapse   . PE (pulmonary embolism)    5-09:was referred to hematology, and they recommend to discontinue Coumadin 5/11  . Prostate cancer [facility])     released from urology in 2010,needs yearly PSAs with PCP  . Seasonal allergies      Family History  Problem Relation Age of Onset  . CAD Father 53  . Hypertension Father   . Glaucoma Mother   . Diabetes Neg Hx   . Colon cancer Neg Hx   . Prostate cancer Neg Hx      Social History   Socioeconomic History  . Marital status: Married    Spouse name: Not on file  . Number of children: 0  . Years of education: Not on file  . Highest education level: Not on file  Occupational History  . Occupation: Retired    Fish farm manager: RETIRED  Tobacco Use  . Smoking status: Never Smoker  . Smokeless tobacco: Never Used  Vaping Use  . Vaping Use: Never used  Substance and  Sexual Activity  . Alcohol use: No  . Drug use: No  . Sexual activity: Not on file  Other Topics Concern  . Not on file  Social History Narrative   Married, wife w/ melanoma, advanced, doing well as off 10/2019   no children    Moved to [facility] 03-2016 (  at the independent area as off 10/2018)   Social Determinants of Health   Financial Resource Strain:   . Difficulty of Paying Living Expenses: Not on file  Food Insecurity:   . Worried About Charity fundraiser in the Last Year: Not on file  . Ran Out of Food in the Last Year: Not on file  Transportation Needs:   . Lack of Transportation (Medical): Not on file  . Lack of Transportation (Non-Medical): Not on file  Physical Activity:   . Days of Exercise per Week: Not on file  . Minutes of Exercise per Session: Not on file  Stress:   . Feeling of Stress : Not on file  Social Connections:   . Frequency of Communication with Friends and Family: Not on file  . Frequency of Social Gatherings with Friends and Family: Not on file  . Attends Religious Services: Not on file  .  Active Member of Clubs or Organizations: Not on file  . Attends Archivist Meetings: Not on file  . Marital Status: Not on file  Intimate Partner Violence:   . Fear of Current or Ex-Partner: Not on file  . Emotionally Abused: Not on file  . Physically Abused: Not on file  . Sexually Abused: Not on file     Allergies  Allergen Reactions  . Nitrofuran Derivatives Other (See Comments)    Caused headaches   . Sulfonamide Derivatives Nausea And Vomiting     Outpatient Medications Prior to Visit  Medication Sig Dispense Refill  . acetaminophen (TYLENOL) 500 MG tablet Take 1,000 mg by mouth every 6 (six) hours as needed for mild pain or headache.    Marland Kitchen apixaban (ELIQUIS) 5 MG TABS tablet Take 1 tablet (5 mg total) by mouth 2 (two) times daily. 180 tablet 1  . atenolol (TENORMIN) 50 MG tablet Take 1 tablet (50 mg total) by mouth daily. 90  tablet 1  . azelastine (ASTELIN) 0.1 % nasal spray Place 2 sprays into both nostrils at bedtime as needed for rhinitis or allergies. 30 mL 12  . calcium carbonate (TUMS - DOSED IN MG ELEMENTAL CALCIUM) 500 MG chewable tablet Chew 1-2 tablets by mouth as needed for indigestion or heartburn.    . diphenhydrAMINE (BENADRYL) 25 MG tablet Take 25 mg by mouth daily as needed for allergies.    . diphenhydramine-acetaminophen (TYLENOL PM) 25-500 MG TABS Take 1 tablet by mouth at bedtime as needed (for sleep).     . docusate sodium (COLACE) 100 MG capsule Take 100 mg by mouth daily as needed for mild constipation.    . dorzolamide-timolol (COSOPT) 22.3-6.8 MG/ML ophthalmic solution     . fluocinonide (LIDEX) 0.05 % external solution Apply 1 application topically See admin instructions. Apply to the scalp as directed at bedtime    . fluticasone (FLONASE) 50 MCG/ACT nasal spray Place 2 sprays into both nostrils daily as needed for allergies or rhinitis. 16 g 5  . folic acid (FOLVITE) 818 MCG tablet Take 400 mcg by mouth at bedtime.     Marland Kitchen gatifloxacin (ZYMAXID) 0.5 % SOLN Place 1 drop into the right eye 4 (four) times daily. 2.5 mL 0  . Hypromellose (ARTIFICIAL TEARS OP) Place 1 drop into both eyes 3 (three) times daily as needed (for dryness).     . Multiple Vitamins-Minerals (MULTIVITAMIN,TX-MINERALS) tablet Take 1 tablet by mouth at bedtime.     . NEOMYCIN-POLYMYXIN EX Place 1 application into both eyes in the morning, at noon, and at bedtime.    . NON FORMULARY Take 1-2 capsules by mouth See admin instructions. M[place]a capsules: Take 1 capsule by mouth once a day, alternating with 2 capsules every other day    . ofloxacin (OCUFLOX) 0.3 % ophthalmic solution Place 1 drop into the left eye 4 (four) times daily.    . ondansetron (ZOFRAN) 4 MG tablet Take 1 tablet (4 mg total) by mouth every 8 (eight) hours as needed for nausea or vomiting. 20 tablet 0  . polyethylene glycol (MIRALAX / GLYCOLAX) packet Take 17  g by mouth daily. [place]    . pravastatin (PRAVACHOL) 40 MG tablet Take 1 tablet (40 mg total) by mouth at bedtime.    . Probiotic Product (ALIGN) 4 MG CAPS Take 4 mg by mouth daily.    . traMADol (ULTRAM) 50 MG tablet Take 1 tablet (50 mg total) by mouth 2 (two) times daily as  needed. 30 tablet 0  . Travoprost, BAK Free, (TRAVATAN Z) 0.004 % SOLN ophthalmic solution Place 1 drop into both eyes at bedtime.     No facility-administered medications prior to visit.         Objective:   Physical Exam  Vitals:   12/26/19 1604  BP: 132/78  Pulse: 80  Temp: 97.6 F (36.4 C)  SpO2: 93%  Weight: 161 lb (73 kg)  Height: 6' (1.829 m)   Gen: Pleasant, thin elderly man, in no distress,  normal affect  ENT: He has a traumatic injury to his left eye with a patch in place, mouth clear,  oropharynx clear, no postnasal drip  Neck: No JVD, no stridor  Lungs: No use of accessory muscles, no crackles or wheezing on normal respiration, no wheeze on forced expiration  Cardiovascular: RRR, heart sounds normal, no murmur or gallops, no peripheral edema  Musculoskeletal: No deformities, no cyanosis or clubbing  Neuro: alert, awake, non focal  Skin: Warm, no lesions or rash      Assessment & Plan:  Pulmonary nodule He has a peripheral right middle lobe wedge-shaped pulmonary nodule.  I can see it on the chest x-rays from May and June, but it is much less evident and possibly absent on the film from April.  Could reflect malignancy but the fact that it appeared in such a short time interval may argue against it.  Consider infection but also consider possible pulmonary infarct given its wedge-shaped nature.  It was a noncontrasted CT that was done in June, consider possible pulmonary embolism related to his newly diagnosed right lower extremity DVT.  We talked about the options for further work-up including biopsy now versus following for interval change.  We agreed to repeat his CT at the end  of September which would be the 71-month interval to look for stability, resolution.  If it is still present, grows then we will talk about neck steps including possible PET scan or biopsy.  The peripheral nature would make it a reasonable TTNA target.  I will follow up with him after the CT to review.  Baltazar Apo, MD, PhD 12/26/2019, 4:39 PM [facility] [phone] or if no answer [phone]

## 2019-12-26 NOTE — Assessment & Plan Note (Signed)
He has a peripheral right middle lobe wedge-shaped pulmonary nodule.  I can see it on the chest x-rays from May and June, but it is much less evident and possibly absent on the film from April.  Could reflect malignancy but the fact that it appeared in such a short time interval may argue against it.  Consider infection but also consider possible pulmonary infarct given its wedge-shaped nature.  It was a noncontrasted CT that was done in June, consider possible pulmonary embolism related to his newly diagnosed right lower extremity DVT.  We talked about the options for further work-up including biopsy now versus following for interval change.  We agreed to repeat his CT at the end of September which would be the 44-month interval to look for stability, resolution.  If it is still present, grows then we will talk about neck steps including possible PET scan or biopsy.  The peripheral nature would make it a reasonable TTNA target.  I will follow up with him after the CT to review.

## 2020-01-04 ENCOUNTER — Ambulatory Visit: Payer: PPO | Admitting: Internal Medicine

## 2020-01-09 ENCOUNTER — Other Ambulatory Visit: Payer: Self-pay

## 2020-01-09 ENCOUNTER — Encounter: Payer: Self-pay | Admitting: Internal Medicine

## 2020-01-09 ENCOUNTER — Ambulatory Visit (INDEPENDENT_AMBULATORY_CARE_PROVIDER_SITE_OTHER): Payer: PPO | Admitting: Internal Medicine

## 2020-01-09 VITALS — BP 128/75 | HR 66 | Temp 97.7°F | Resp 16 | Ht 72.0 in | Wt 163.0 lb

## 2020-01-09 DIAGNOSIS — R911 Solitary pulmonary nodule: Secondary | ICD-10-CM

## 2020-01-09 DIAGNOSIS — I824Y1 Acute embolism and thrombosis of unspecified deep veins of right proximal lower extremity: Secondary | ICD-10-CM

## 2020-01-09 DIAGNOSIS — Z86711 Personal history of pulmonary embolism: Secondary | ICD-10-CM | POA: Diagnosis not present

## 2020-01-09 DIAGNOSIS — Z86718 Personal history of other venous thrombosis and embolism: Secondary | ICD-10-CM

## 2020-01-09 NOTE — Patient Instructions (Signed)
Schedule a ultrasound of your right leg at your convenience .       GO TO THE FRONT DESK, PLEASE SCHEDULE YOUR APPOINTMENTS Come back for a checkup in 4 months

## 2020-01-09 NOTE — Progress Notes (Signed)
Subjective:    Patient ID: Brandon Burke, male    DOB: 06-Oct-1931, 86 y.o.   MRN: [id number]  DOS:  01/09/2020 Type of visit - description: Follow-up Here for follow-up. Overall he feels well. DVT right leg: Recently DX, denies any swelling of the right leg but he did have some pain yesterday at the right calf.   Review of Systems See above   Past Medical History:  Diagnosis Date  . Constipation   . DVT (deep venous thrombosis) ([place])    hx of 2001 (after prostate surgery)    . Glaucoma   . Hyperlipidemia   . Hypertension   . Melanoma ([place])    face, hand  . Mitral valve prolapse   . PE (pulmonary embolism)    5-09:was referred to hematology, and they recommend to discontinue Coumadin 5/11  . Prostate cancer [facility])     released from urology in 2010,needs yearly PSAs with PCP  . Seasonal allergies     Past Surgical History:  Procedure Laterality Date  . CATARACT EXTRACTION Bilateral   . COLONOSCOPY     several  . EYE SURGERY Bilateral    Cat Sx  . PARS PLANA VITRECTOMY Left 09/06/2019   Procedure: PARS PLANA VITRECTOMY WITH 25 GAUGE WITH INTRAVITREAL ANTIBIOTICS;  Surgeon: [name], MD;  Location: [place];  Service: Ophthalmology;  Laterality: Left;  . PHOTOCOAGULATION WITH LASER Left 09/06/2019   Procedure: Photocoagulation With Laser;  Surgeon: [name], MD;  Location: [place];  Service: Ophthalmology;  Laterality: Left;  . PROSTATECTOMY  2000  . RUPTURED GLOBE EXPLORATION AND REPAIR Left 08/16/2019   Procedure: OPEN GLOBE EXPLORATION EYE POSSIBLE ANTERIOR  CHAMBER [place] OUT;  Surgeon: Lonia Skinner, MD;  Location: [place];  Service: Ophthalmology;  Laterality: Left;    Allergies as of 01/09/2020      Reactions   Nitrofuran Derivatives Other (See Comments)   Caused headaches   Sulfonamide Derivatives Nausea And Vomiting      Medication List       Accurate as of January 09, 2020 11:59 PM. If you have any questions, ask your nurse or doctor.        STOP  taking these medications   gatifloxacin 0.5 % Soln Commonly known as: ZYMAXID Stopped by: [name], MD   NEOMYCIN-POLYMYXIN EX Stopped by: [name], MD   ofloxacin 0.3 % ophthalmic solution Commonly known as: OCUFLOX Stopped by: [name], MD     TAKE these medications   acetaminophen 500 MG tablet Commonly known as: TYLENOL Take 1,000 mg by mouth every 6 (six) hours as needed for mild pain or headache.   Align 4 MG Caps Take 4 mg by mouth daily.   apixaban 5 MG Tabs tablet Commonly known as: Eliquis Take 1 tablet (5 mg total) by mouth 2 (two) times daily.   ARTIFICIAL TEARS OP Place 1 drop into both eyes 3 (three) times daily as needed (for dryness).   atenolol 50 MG tablet Commonly known as: TENORMIN Take 1 tablet (50 mg total) by mouth daily.   azelastine 0.1 % nasal spray Commonly known as: ASTELIN Place 2 sprays into both nostrils at bedtime as needed for rhinitis or allergies.   calcium carbonate 500 MG chewable tablet Commonly known as: TUMS - dosed in mg elemental calcium Chew 1-2 tablets by mouth as needed for indigestion or heartburn.   diphenhydrAMINE 25 MG tablet Commonly known as: BENADRYL Take 25 mg by mouth daily as needed for  allergies.   diphenhydramine-acetaminophen 25-500 MG Tabs tablet Commonly known as: TYLENOL PM Take 1 tablet by mouth at bedtime as needed (for sleep).   docusate sodium 100 MG capsule Commonly known as: COLACE Take 100 mg by mouth daily as needed for mild constipation.   dorzolamide-timolol 22.3-6.8 MG/ML ophthalmic solution Commonly known as: COSOPT   fluocinonide 0.05 % external solution Commonly known as: LIDEX Apply 1 application topically See admin instructions. Apply to the scalp as directed at bedtime   fluticasone 50 MCG/ACT nasal spray Commonly known as: FLONASE Place 2 sprays into both nostrils daily as needed for allergies or rhinitis.   folic acid 814 MCG tablet Commonly known as: FOLVITE Take 400 mcg  by mouth at bedtime.   multivitamin,tx-minerals tablet Take 1 tablet by mouth at bedtime.   NON FORMULARY Take 1-2 capsules by mouth See admin instructions. [place] capsules: Take 1 capsule by mouth once a day, alternating with 2 capsules every other day   ondansetron 4 MG tablet Commonly known as: Zofran Take 1 tablet (4 mg total) by mouth every 8 (eight) hours as needed for nausea or vomiting.   polyethylene glycol 17 g packet Commonly known as: MIRALAX / GLYCOLAX Take 17 g by mouth daily. MIX AND DRINK   pravastatin 40 MG tablet Commonly known as: PRAVACHOL Take 1 tablet (40 mg total) by mouth at bedtime.   traMADol 50 MG tablet Commonly known as: Ultram Take 1 tablet (50 mg total) by mouth 2 (two) times daily as needed.   Travatan Z 0.004 % Soln ophthalmic solution Generic drug: Travoprost (BAK Free) Place 1 drop into both eyes at bedtime.          Objective:   Physical Exam BP 128/75 (BP Location: Left Arm, Patient Position: Sitting, Cuff Size: Small)   Pulse 66   Temp 97.7 F (36.5 C) (Oral)   Resp 16   Ht 6' (1.829 m)   Wt 163 lb (73.9 kg)   SpO2 96%   BMI 22.11 kg/m   General:   Well developed, NAD, BMI noted. HEENT:  Normocephalic . Face symmetric, atraumatic Lungs:  CTA B Normal respiratory effort, no intercostal retractions, no accessory muscle use. Heart: RRR,  no murmur.  Lower extremities:  Right calf is soft, not tender, circumference is around 1.3 inches larger compared to the left. Left calf: Soft, nontender Skin: Not pale. Not jaundice Neurologic:  alert & oriented X3.  Speech normal, gait appropriate for age and unassisted Psych--  Cognition and judgment appear intact.  Cooperative with normal attention span and concentration.  Behavior appropriate. No anxious or depressed appearing.      Assessment     Assessment Hyperlipidemia Prostate cancer, released from urology 2010, Rx yearly PSAs by PCP HEM: DVT 2001 after surgery; PE  2009, eval by hematology, they recommend DC Coumadin 2011.  Spontaneous right leg DVT 11/2019 H/o MVP-- on BB glaucoma Sees dermatology q 6 months  Facial FX 08-2019, lost L eye vision  PLAN Right leg DVT:  Unprovoked, see last visit.  Will rec anticoagulants for life, good compliance with no apparent side effects. The patient is in need to have eye surgery and wonders when it is safe to hold Eliquis. We will do ultrasound, if the DVT is resolved, I think he will be safe for him to hold temporarily Eliquis   and resume as soon as the surgeon give him the okay. Pulmonary nodule: Saw pulmonary 12/26/2019, plan is to recheck a CT in 6  months. Weakness, + DOE, night sweats: Resolved per patient RTC 4 months  This visit occurred during the SARS-CoV-2 public health emergency.  Safety protocols were in place, including screening questions prior to the visit, additional usage of staff PPE, and extensive cleaning of exam room while observing appropriate contact time as indicated for disinfecting solutions.

## 2020-01-09 NOTE — Progress Notes (Signed)
Pre visit review using our clinic review tool, if applicable. No additional management support is needed unless otherwise documented below in the visit note. 

## 2020-01-10 NOTE — Assessment & Plan Note (Signed)
Right leg DVT:  Unprovoked, see last visit.  Will rec anticoagulants for life, good compliance with no apparent side effects. The patient is in need to have eye surgery and wonders when it is safe to hold Eliquis. We will do ultrasound, if the DVT is resolved, I think he will be safe for him to hold temporarily Eliquis   and resume as soon as the surgeon give him the okay. Pulmonary nodule: Saw pulmonary 12/26/2019, plan is to recheck a CT in 6 months. Weakness, + DOE, night sweats: Resolved per patient RTC 4 months

## 2020-01-14 ENCOUNTER — Ambulatory Visit (HOSPITAL_BASED_OUTPATIENT_CLINIC_OR_DEPARTMENT_OTHER)
Admission: RE | Admit: 2020-01-14 | Discharge: 2020-01-14 | Disposition: A | Discharge status: Home / Self Care | Payer: PPO | Source: Ambulatory Visit | Attending: Internal Medicine | Admitting: Internal Medicine

## 2020-01-14 ENCOUNTER — Other Ambulatory Visit: Payer: Self-pay

## 2020-01-14 DIAGNOSIS — I824Y1 Acute embolism and thrombosis of unspecified deep veins of right proximal lower extremity: Secondary | ICD-10-CM | POA: Diagnosis not present

## 2020-01-14 DIAGNOSIS — I82401 Acute embolism and thrombosis of unspecified deep veins of right lower extremity: Secondary | ICD-10-CM | POA: Diagnosis not present

## 2020-01-14 DIAGNOSIS — I82501 Chronic embolism and thrombosis of unspecified deep veins of right lower extremity: Secondary | ICD-10-CM | POA: Diagnosis not present

## 2020-01-21 NOTE — Progress Notes (Signed)
[facility] Note  01/22/2020     CHIEF COMPLAINT Patient presents for Retina Follow Up   HISTORY OF PRESENT ILLNESS: Brandon Burke is a 86 y.o. male who presents to the clinic today for:   HPI    Retina Follow Up    Patient presents with  Other.  In left eye.  This started 5 months ago.  Severity is severe.  Duration of 3.5 months.  Since onset it is stable.  I, the attending physician,  performed the HPI with the patient and updated documentation appropriately.          Comments    86 y/o male pt here for 3.5 mo f/u for ocular trauma OS.  S/p PPV w/EL and intravitreal abx OS 5.6.21.  No change in [place] OU.  Denies pain, FOL, floaters.  AT prn OU, Cosopt BID OU.  Has been having some issues lately with blood clots and with his lungs.       Last edited by [name], MD on 01/22/2020 11:06 PM. (History)    pt states his left eye is "hanging in there", he states he saw Dr. Sabino Dick who recommended a tarsorrhaphy , pt states that procedure was cancelled bc his PCP found blood clots in his legs, pt states they are clearing up, but now he has problems with his lungs, pts next appt with Dr. Eulas Post is next week   Referring physician: Lonia Skinner, MD [address] [place],  [place] [address]  HISTORICAL INFORMATION:   Selected notes from the MEDICAL RECORD NUMBER Referred by Dr. Gala Romney for B-scan   CURRENT MEDICATIONS: Current Outpatient Medications (Ophthalmic Drugs)  Medication Sig   dorzolamide-timolol (COSOPT) 22.3-6.8 MG/ML ophthalmic solution    Hypromellose (ARTIFICIAL TEARS OP) Place 1 drop into both eyes 3 (three) times daily as needed (for dryness).    Travoprost, BAK Free, (TRAVATAN Z) 0.004 % SOLN ophthalmic solution Place 1 drop into both eyes at bedtime.   No current facility-administered medications for this visit. (Ophthalmic Drugs)   Current Outpatient Medications (Other)  Medication Sig   acetaminophen  (TYLENOL) 500 MG tablet Take 1,000 mg by mouth every 6 (six) hours as needed for mild pain or headache.   apixaban (ELIQUIS) 5 MG TABS tablet Take 1 tablet (5 mg total) by mouth 2 (two) times daily.   atenolol (TENORMIN) 50 MG tablet Take 1 tablet (50 mg total) by mouth daily.   azelastine (ASTELIN) 0.1 % nasal spray Place 2 sprays into both nostrils at bedtime as needed for rhinitis or allergies.   calcium carbonate (TUMS - DOSED IN MG ELEMENTAL CALCIUM) 500 MG chewable tablet Chew 1-2 tablets by mouth as needed for indigestion or heartburn.   diphenhydrAMINE (BENADRYL) 25 MG tablet Take 25 mg by mouth daily as needed for allergies.   diphenhydramine-acetaminophen (TYLENOL PM) 25-500 MG TABS Take 1 tablet by mouth at bedtime as needed (for sleep).    docusate sodium (COLACE) 100 MG capsule Take 100 mg by mouth daily as needed for mild constipation.   fluocinonide (LIDEX) 0.05 % external solution Apply 1 application topically See admin instructions. Apply to the scalp as directed at bedtime   fluticasone (FLONASE) 50 MCG/ACT nasal spray Place 2 sprays into both nostrils daily as needed for allergies or rhinitis.   folic acid (FOLVITE) 384 MCG tablet Take 400 mcg by mouth at bedtime.    Multiple Vitamins-Minerals (MULTIVITAMIN,TX-MINERALS) tablet Take 1 tablet by  mouth at bedtime.    NON FORMULARY Take 1-2 capsules by mouth See admin instructions. [place] capsules: Take 1 capsule by mouth once a day, alternating with 2 capsules every other day   ondansetron (ZOFRAN) 4 MG tablet Take 1 tablet (4 mg total) by mouth every 8 (eight) hours as needed for nausea or vomiting.   polyethylene glycol (MIRALAX / GLYCOLAX) packet Take 17 g by mouth daily. MIX AND DRINK   pravastatin (PRAVACHOL) 40 MG tablet Take 1 tablet (40 mg total) by mouth at bedtime.   Probiotic Product (ALIGN) 4 MG CAPS Take 4 mg by mouth daily.   traMADol (ULTRAM) 50 MG tablet Take 1 tablet (50 mg total) by mouth 2 (two)  times daily as needed.   No current facility-administered medications for this visit. (Other)      REVIEW OF SYSTEMS: ROS    Positive for: Cardiovascular, Eyes   Negative for: Constitutional, Gastrointestinal, Neurological, Skin, Genitourinary, Musculoskeletal, HENT, Endocrine, Respiratory, Psychiatric, Allergic/Imm, Heme/Lymph   Last edited by Matthew Folks, COA on 01/22/2020  9:03 AM. (History)       ALLERGIES Allergies  Allergen Reactions   Nitrofuran Derivatives Other (See Comments)    Caused headaches    Sulfonamide Derivatives Nausea And Vomiting    PAST MEDICAL HISTORY Past Medical History:  Diagnosis Date   Constipation    DVT (deep venous thrombosis) (HCC)    hx of 2001 (after prostate surgery)     Glaucoma    Hyperlipidemia    Hypertension    Melanoma ([place])    face, hand   Mitral valve prolapse    PE (pulmonary embolism)    5-09:was referred to hematology, and they recommend to discontinue Coumadin 5/11   Prostate cancer ([place])     released from urology in 2010,needs yearly PSAs with PCP   Seasonal allergies    Past Surgical History:  Procedure Laterality Date   CATARACT EXTRACTION Bilateral    COLONOSCOPY     several   EYE SURGERY Bilateral    Cat Sx   PARS PLANA VITRECTOMY Left 09/06/2019   Procedure: PARS PLANA VITRECTOMY WITH 25 GAUGE WITH INTRAVITREAL ANTIBIOTICS;  Surgeon: [name], MD;  Location: [facility];  Service: Ophthalmology;  Laterality: Left;   PHOTOCOAGULATION WITH LASER Left 09/06/2019   Procedure: Photocoagulation With Laser;  Surgeon: [name], MD;  Location: [facility];  Service: Ophthalmology;  Laterality: Left;   PROSTATECTOMY  2000   RUPTURED GLOBE EXPLORATION AND REPAIR Left 08/16/2019   Procedure: OPEN GLOBE EXPLORATION EYE POSSIBLE ANTERIOR  CHAMBER [place] OUT;  Surgeon: Lonia Skinner, MD;  Location: [facility];  Service: Ophthalmology;  Laterality: Left;    FAMILY HISTORY Family History  Problem Relation Age  of Onset   CAD Father 64   Hypertension Father    Glaucoma Mother    Diabetes Neg Hx    Colon cancer Neg Hx    Prostate cancer Neg Hx     SOCIAL HISTORY Social History   Tobacco Use   Smoking status: Never Smoker   Smokeless tobacco: Never Used  Vaping Use   Vaping Use: Never used  Substance Use Topics   Alcohol use: No   Drug use: No         OPHTHALMIC EXAM:  Base Eye Exam    Visual Acuity (Snellen - Linear)      Right Left   Dist cc 20/40 -2 CF @ 2'   Dist ph cc NI NI   Correction:  Glasses       Tonometry (Tonopen, 9:10 AM)      Right Left   Pressure 13 8       Pupils      Dark Light Shape React APD   Right 4 3 Round Brisk None   Left 6 6 Irregular No None  Hazy view OS       Visual Fields      Left Right     Full   Restrictions Partial outer superior temporal, inferior temporal, superior nasal, inferior nasal deficiencies        Extraocular Movement      Right Left    -- -- --  --  --  -- -- --   -3 -3 -3  -3  -3  -3 -3 -3    Abnormal EOM movements OS       Neuro/Psych    Oriented x3: Yes   Mood/Affect: Normal       Dilation    Both eyes: 1.0% Mydriacyl, 2.5% Phenylephrine @ 9:10 AM        Slit Lamp and Fundus Exam    External Exam      Right Left   External  Enophthalmos       Slit Lamp Exam      Right Left   Lids/Lashes Dermatochalasis - upper lid, Meibomian gland dysfunction Ptosis, lagophthalmos, incomplete blink, lash ptosis   Conjunctiva/Sclera White and quiet mild [place]; sutures intact   Cornea Arcus, central haze/crocodile shegreen  Arcus, central haze/crocodile shagreen, 1-2+ Punctate epithelial erosions, irregular epi with thinning inferiorly -- improved   Anterior Chamber deep, narrow temporal angle Deep, blood stained vitreous prolapse temporally -- improved   Iris Round and dilated Round and dilated, severely atrophic   Lens PC IOL in good position PC IOL in good position   Vitreous Vitreous syneresis  post vitrectomy, clear       Fundus Exam      Right Left   Disc Pink and Sharp mild pallor, +cupping, very thin superior rim, Sharp rim   C/D Ratio 0.6 0.85   Macula Flat, Blunted foveal reflex, mild RPE mottling and clumping, No heme or edema Flat, Good foveal reflex, mild Retinal pigment epithelial mottling, No heme or edema   Vessels Vascular attenuation, Tortuous Vascular attenuation   Periphery Attached, mild reticular degeneration Attached, view limited by restricted EOM             IMAGING AND PROCEDURES  Imaging and Procedures for @TODAY @  OCT, Retina - OU - Both Eyes       Right Eye Quality was good. Central Foveal Thickness: 306. Progression has been stable. Findings include normal foveal contour, no IRF, no SRF.   Left Eye Quality was borderline. Central Foveal Thickness: 315. Progression has been stable. Findings include no SRF, no IRF, abnormal foveal contour (Mild central ellipsoid thinning; trace ERM non-central).   Notes *Images captured and stored on drive  Diagnosis / Impression:  OD: NFP, no IRF/SRF OS: NFP; no IRF/SRF; Mild central ellipsoid thinning, trace, non-central ERM  Clinical management:  See below  Abbreviations: NFP - Normal foveal profile. CME - cystoid macular edema. PED - pigment epithelial detachment. IRF - intraretinal fluid. SRF - subretinal fluid. EZ - ellipsoid zone. ERM - epiretinal membrane. ORA - outer retinal atrophy. ORT - outer retinal tubulation. SRHM - subretinal hyper-reflective material                 ASSESSMENT/PLAN:  ICD-10-CM   1. Left eye injury, sequela  S05.92XS   2. Hyphema of left eye  H21.02   3. Vitreous prolapse of left eye  H43.02   4. Retinal edema  H35.81 OCT, Retina - OU - Both Eyes  5. Closed fracture of orbit, sequela ([place])  S02.85XS   6. Vitreous hemorrhage of left eye (HCC)  H43.12   7. Primary open angle glaucoma of both eyes, unspecified glaucoma stage  H40.1130   8. Pseudophakia of both  eyes  Z96.1     1-6. Eye trauma with orbital fractures, hyphema, vitreous prolapse and vitreous hemorrhage OS  - mechanism of injury -- fall on 04.08.21  - under the expert management of Dr. Eulas Post  - underwent exploration with Dr. Eulas Post on 04.15.21  - no globe rupture  - initially had elevated IOPs in the setting of history of POAG managed by Dr. Jerline Pain  - significant debris and old heme persisted in the [facility] and vitreous cavity  - s/p PPV w/ EL and intravitreal abx OS, 05.06.21             - intra-op -- retina in good shape without RT/RD, +restricted EOM  - vitreous cultures for fungus and bacteria 05.06.21 -- no growth             - retina attached with vitreous clear  - IOP stably improved -- 8 OS today  - exam today shows +enophthalmos, persistent EOM restriction; +lagophthalmos w/ incomplete blink  - recommend eval with oculoplastics -- discussed with Dr. Eulas Post who has referred to Dr. Sabino Dick at [facility]   - Dr. Sabino Dick recommended a tarsorrhaphy, but procedure was canceled due to blood clots in pts legs             - IOP 08             - PF taper completed -- now just taking ATs and Cosopt BID OU  - overall, retina stable  - f/u 6 months, sooner prn -- DFE, OCT  7. History of POAG  8. Pseudophakia OU  - s/p CE/IOL OU  - IOLs in good position   Ophthalmic Meds Ordered this visit:  No orders of the defined types were placed in this encounter.      Return in about 6 months (around 07/21/2020) for f/u eye trauma OS, DFE, OCT.  There are no Patient Instructions on file for this visit.   Explained the diagnoses, plan, and follow up with the patient and they expressed understanding.  Patient expressed understanding of the importance of proper follow up care.   This document serves as a record of services personally performed by Gardiner Sleeper, MD, PhD. It was created on their behalf by Estill Bakes, COT an ophthalmic technician. The creation of this record is the provider's  dictation and/or activities during the visit.    Electronically signed by: Estill Bakes, COT 9.20.21 @ 11:14 PM   This document serves as a record of services personally performed by Gardiner Sleeper, MD, PhD. It was created on their behalf by San Jetty. Owens Shark, OA an ophthalmic technician. The creation of this record is the provider's dictation and/or activities during the visit.    Electronically signed by: San Jetty. Owens Shark, [place] 09.21.2021 11:14 PM  Gardiner Sleeper, M.D., Ph.D. Diseases & Surgery of the Retina and [place] 01/22/2020   I have reviewed the above documentation for accuracy and completeness, and I agree with  the above. Gardiner Sleeper, M.D., Ph.D. 01/22/20 11:14 PM   Abbreviations: M myopia (nearsighted); A astigmatism; H hyperopia (farsighted); P presbyopia; Mrx spectacle prescription;  CTL contact lenses; OD right eye; OS left eye; OU both eyes  XT exotropia; ET esotropia; PEK punctate epithelial keratitis; PEE punctate epithelial erosions; DES dry eye syndrome; MGD meibomian gland dysfunction; ATs artificial tears; PFAT's preservative free artificial tears; [place] nuclear sclerotic cataract; PSC posterior subcapsular cataract; ERM epi-retinal membrane; PVD posterior vitreous detachment; RD retinal detachment; DM diabetes mellitus; DR diabetic retinopathy; NPDR non-proliferative diabetic retinopathy; PDR proliferative diabetic retinopathy; CSME clinically significant macular edema; DME diabetic macular edema; dbh dot blot hemorrhages; CWS cotton wool spot; POAG primary open angle glaucoma; C/D cup-to-disc ratio; HVF humphrey visual field; GVF goldmann visual field; OCT optical coherence tomography; IOP intraocular pressure; BRVO Branch retinal vein occlusion; CRVO central retinal vein occlusion; CRAO central retinal artery occlusion; BRAO branch retinal artery occlusion; RT retinal tear; SB scleral buckle; PPV pars plana vitrectomy; VH Vitreous  hemorrhage; PRP panretinal laser photocoagulation; IVK intravitreal kenalog; VMT vitreomacular traction; MH Macular hole;  NVD neovascularization of the disc; NVE neovascularization elsewhere; AREDS age related eye disease study; ARMD age related macular degeneration; POAG primary open angle glaucoma; EBMD epithelial/anterior basement membrane dystrophy; ACIOL anterior chamber intraocular lens; IOL intraocular lens; PCIOL posterior chamber intraocular lens; Phaco/IOL phacoemulsification with intraocular lens placement; [place] photorefractive keratectomy; LASIK laser assisted in situ keratomileusis; HTN hypertension; DM diabetes mellitus; COPD chronic obstructive pulmonary disease

## 2020-01-22 ENCOUNTER — Encounter (INDEPENDENT_AMBULATORY_CARE_PROVIDER_SITE_OTHER): Payer: Self-pay | Admitting: Ophthalmology

## 2020-01-22 ENCOUNTER — Other Ambulatory Visit: Payer: Self-pay

## 2020-01-22 ENCOUNTER — Ambulatory Visit (INDEPENDENT_AMBULATORY_CARE_PROVIDER_SITE_OTHER): Payer: PPO | Admitting: Ophthalmology

## 2020-01-22 DIAGNOSIS — H3581 Retinal edema: Secondary | ICD-10-CM | POA: Diagnosis not present

## 2020-01-22 DIAGNOSIS — H4302 Vitreous prolapse, left eye: Secondary | ICD-10-CM | POA: Diagnosis not present

## 2020-01-22 DIAGNOSIS — H401130 Primary open-angle glaucoma, bilateral, stage unspecified: Secondary | ICD-10-CM

## 2020-01-22 DIAGNOSIS — S0592XS Unspecified injury of left eye and orbit, sequela: Secondary | ICD-10-CM | POA: Diagnosis not present

## 2020-01-22 DIAGNOSIS — Z961 Presence of intraocular lens: Secondary | ICD-10-CM

## 2020-01-22 DIAGNOSIS — H2102 Hyphema, left eye: Secondary | ICD-10-CM

## 2020-01-22 DIAGNOSIS — S0285XS Fracture of orbit, unspecified, sequela: Secondary | ICD-10-CM

## 2020-01-22 DIAGNOSIS — H4312 Vitreous hemorrhage, left eye: Secondary | ICD-10-CM | POA: Diagnosis not present

## 2020-01-28 DIAGNOSIS — H02204 Unspecified lagophthalmos left upper eyelid: Secondary | ICD-10-CM | POA: Diagnosis not present

## 2020-01-28 DIAGNOSIS — H02205 Unspecified lagophthalmos left lower eyelid: Secondary | ICD-10-CM | POA: Diagnosis not present

## 2020-01-28 DIAGNOSIS — H16212 Exposure keratoconjunctivitis, left eye: Secondary | ICD-10-CM | POA: Diagnosis not present

## 2020-01-28 DIAGNOSIS — H4032X3 Glaucoma secondary to eye trauma, left eye, severe stage: Secondary | ICD-10-CM | POA: Diagnosis not present

## 2020-01-29 ENCOUNTER — Ambulatory Visit (HOSPITAL_BASED_OUTPATIENT_CLINIC_OR_DEPARTMENT_OTHER)
Admission: RE | Admit: 2020-01-29 | Discharge: 2020-01-29 | Disposition: A | Discharge status: Home / Self Care | Payer: PPO | Source: Ambulatory Visit | Attending: Emergency Medicine | Admitting: Emergency Medicine

## 2020-01-29 ENCOUNTER — Other Ambulatory Visit: Payer: Self-pay

## 2020-01-29 DIAGNOSIS — I2510 Atherosclerotic heart disease of native coronary artery without angina pectoris: Secondary | ICD-10-CM | POA: Diagnosis not present

## 2020-01-29 DIAGNOSIS — R918 Other nonspecific abnormal finding of lung field: Secondary | ICD-10-CM | POA: Diagnosis not present

## 2020-01-29 DIAGNOSIS — I700 Atherosclerosis of aorta: Secondary | ICD-10-CM | POA: Diagnosis not present

## 2020-01-29 DIAGNOSIS — R911 Solitary pulmonary nodule: Secondary | ICD-10-CM | POA: Insufficient documentation

## 2020-02-01 ENCOUNTER — Encounter: Payer: Self-pay | Admitting: Emergency Medicine

## 2020-02-01 ENCOUNTER — Other Ambulatory Visit: Payer: Self-pay

## 2020-02-01 ENCOUNTER — Ambulatory Visit (INDEPENDENT_AMBULATORY_CARE_PROVIDER_SITE_OTHER): Payer: PPO | Admitting: Emergency Medicine

## 2020-02-01 DIAGNOSIS — R911 Solitary pulmonary nodule: Secondary | ICD-10-CM

## 2020-02-01 NOTE — Patient Instructions (Signed)
Your CT scan of the chest has improved with a decrease in size of the nodular area in your right middle lobe.  This is good news. We will plan to repeat your CT scan of the chest without contrast in 1 year. Follow-up with Dr. Lamonte Sakai in 1 year to review the scan or sooner if you have any new problems with breathing or respiratory symptoms.

## 2020-02-01 NOTE — Addendum Note (Signed)
Addended by: Gavin Potters R on: 02/01/2020 12:07 PM   Modules accepted: Orders

## 2020-02-01 NOTE — Assessment & Plan Note (Signed)
Small right middle lobe lobulated opacity with a wedgelike component of the fissure.  This is smaller on CT, inconsistent with malignancy.  Given its size it probably still needs to be followed 1 more time for interval change to make sure it remains stable.  In retrospect question whether this may have been a pulmonary infarct from an undiagnosed PE when he had his most recent DVT in July 2021.  Your CT scan of the chest has improved with a decrease in size of the nodular area in your right middle lobe.  This is good news. We will plan to repeat your CT scan of the chest without contrast in 1 year. Follow-up with Dr. Lamonte Sakai in 1 year to review the scan or sooner if you have any new problems with breathing or respiratory symptoms.

## 2020-02-01 NOTE — Progress Notes (Signed)
   Subjective:    Patient ID: Brandon Burke, male    DOB: [birth year], 86 y.o.   MRN: [id number]  HPI 86 year old never smoker with a history of hypertension, prostate cancer, DVT/pulmonary embolism with recurrent DVT 11/2019, seasonal allergies.  He is here for evaluation of an abnormal CT scan of the chest. He suffered a fall and contusion of his left chest in early April.  Serial chest x-rays subsequent to that showed a R basilar  that ultimately prompted a CT scan of the chest 10/31/2019 which I have reviewed.  This shows a somewhat wedge-shaped subpleural area of consolidation in the lateral segment of the right middle lobe 2.8 x 2.2 cm with some additional cluster centrilobular nodules superiorly in the lateral segment. He was having cough and some chest pain after the fall, has now improved some.   ROV 02/01/20 --follow-up visit for 86 year old gentleman with a history of VTE, seasonal allergies, prostate CA, hypertension.  He has an abnormal CT chest in the aftermath of a traumatic fall and left chest contusion in April 2021.  We repeated his CT scan of the chest on 01/30/2020 and I have reviewed.  This shows improvement in the lateral right middle lobe opacity, stability of a 10 x 5 mm bilobed nodule in the medial right middle lobe, unchanged.   Review of Systems As per HPI      Objective:   Physical Exam  Vitals:   02/01/20 1038  BP: 124/70  Pulse: 67  Temp: 98.1 F (36.7 C)  TempSrc: Oral  SpO2: 99%  Weight: 164 lb 3.2 oz (74.5 kg)  Height: 6' (1.829 m)   Gen: Pleasant, thin elderly man, in no distress,  normal affect  ENT: He has a traumatic injury to his left eye with a patch in place, mouth clear,  oropharynx clear, no postnasal drip  Neck: No JVD, no stridor  Lungs: No use of accessory muscles, no crackles or wheezing on normal respiration, no wheeze on forced expiration  Cardiovascular: RRR, heart sounds normal, no murmur or gallops, no peripheral  edema  Musculoskeletal: No deformities, no cyanosis or clubbing  Neuro: alert, awake, non focal  Skin: Warm, no lesions or rash      Assessment & Plan:  Pulmonary nodule Small right middle lobe lobulated opacity with a wedgelike component of the fissure.  This is smaller on CT, inconsistent with malignancy.  Given its size it probably still needs to be followed 1 more time for interval change to make sure it remains stable.  In retrospect question whether this may have been a pulmonary infarct from an undiagnosed PE when he had his most recent DVT in July 2021.  Your CT scan of the chest has improved with a decrease in size of the nodular area in your right middle lobe.  This is good news. We will plan to repeat your CT scan of the chest without contrast in 1 year. Follow-up with Dr. Lamonte Sakai in 1 year to review the scan or sooner if you have any new problems with breathing or respiratory symptoms.  Brandon Apo, MD, PhD 02/01/2020, 10:52 AM [facility] [phone] or if no answer [phone]

## 2020-02-29 DIAGNOSIS — M79672 Pain in left foot: Secondary | ICD-10-CM | POA: Diagnosis not present

## 2020-02-29 DIAGNOSIS — L602 Onychogryphosis: Secondary | ICD-10-CM | POA: Diagnosis not present

## 2020-02-29 DIAGNOSIS — M79671 Pain in right foot: Secondary | ICD-10-CM | POA: Diagnosis not present

## 2020-02-29 DIAGNOSIS — L84 Corns and callosities: Secondary | ICD-10-CM | POA: Diagnosis not present

## 2020-03-18 ENCOUNTER — Ambulatory Visit (INDEPENDENT_AMBULATORY_CARE_PROVIDER_SITE_OTHER): Payer: PPO | Admitting: Internal Medicine

## 2020-03-18 ENCOUNTER — Encounter: Payer: Self-pay | Admitting: Internal Medicine

## 2020-03-18 ENCOUNTER — Other Ambulatory Visit: Payer: Self-pay

## 2020-03-18 VITALS — BP 148/74 | HR 70 | Temp 99.2°F | Resp 18 | Ht 72.0 in | Wt 166.1 lb

## 2020-03-18 DIAGNOSIS — R531 Weakness: Secondary | ICD-10-CM | POA: Diagnosis not present

## 2020-03-18 DIAGNOSIS — F320 Major depressive disorder, single episode, mild: Secondary | ICD-10-CM | POA: Diagnosis not present

## 2020-03-18 DIAGNOSIS — R5383 Other fatigue: Secondary | ICD-10-CM | POA: Diagnosis not present

## 2020-03-18 MED ORDER — FLUOXETINE HCL 20 MG PO TABS: ORAL_TABLET | ORAL | 3 refills | Status: DC

## 2020-03-18 NOTE — Progress Notes (Signed)
Pre visit review using our clinic review tool, if applicable. No additional management support is needed unless otherwise documented below in the visit note. 

## 2020-03-18 NOTE — Patient Instructions (Signed)
Start fluoxetine 20 mg: Half tablet a day for 1 week, then 1 tablet daily  GO TO THE LAB : Get the blood work     [place], [place] back for a checkup in 6 weeks

## 2020-03-18 NOTE — Progress Notes (Signed)
Subjective:    Patient ID: Brandon Burke, male    DOB: 12/11/1931, 86 y.o.   MRN: [id number]  DOS:  03/18/2020 Type of visit - description:  Acute  3 months history of gradual decline on energy. He also reports that he fall asleep very easily, take naps twice a day. Appetite is decreased. He also admits to a stress, mostly because he needs to do a lot of driving and his eyesight is not the best. I asked about depression, the patient is not sure, the wife who is here states that she has noted him to be sad and down most days. That prompted the question of why he feels depressed and he said that is because he cannot do what he used to before d/t multiple medical problems.   Wt Readings from Last 3 Encounters:  03/18/20 166 lb 2 oz (75.4 kg)  02/01/20 164 lb 3.2 oz (74.5 kg)  01/09/20 163 lb (73.9 kg)     Review of Systems  Denies fever chills.  No night sweats No headaches, no weight loss. Occasional pain at the shoulder or arms, short-lived.  I am not sure if these aches represent myalgias per se Denies any jaw claudication, scalp numbness, or headaches.  No chest pain or palpitations. + DOE, not a new issue. No snoring. Appetite is poor but he denies postprandial nausea, vomiting, diarrhea or blood in the stools. No cough. No gross hematuria.    Past Medical History:  Diagnosis Date  . Constipation   . DVT (deep venous thrombosis) ([place])    hx of 2001 (after prostate surgery)    . Glaucoma   . Hyperlipidemia   . Hypertension   . Melanoma ([place])    face, hand  . Mitral valve prolapse   . PE (pulmonary embolism)    5-09:was referred to hematology, and they recommend to discontinue Coumadin 5/11  . Prostate cancer [facility])     released from urology in 2010,needs yearly PSAs with PCP  . Seasonal allergies     Past Surgical History:  Procedure Laterality Date  . CATARACT EXTRACTION Bilateral   . COLONOSCOPY     several  . EYE SURGERY Bilateral    Cat Sx    . PARS PLANA VITRECTOMY Left 09/06/2019   Procedure: PARS PLANA VITRECTOMY WITH 25 GAUGE WITH INTRAVITREAL ANTIBIOTICS;  Surgeon: [name], MD;  Location: [place];  Service: Ophthalmology;  Laterality: Left;  . PHOTOCOAGULATION WITH LASER Left 09/06/2019   Procedure: Photocoagulation With Laser;  Surgeon: [name], MD;  Location: [place];  Service: Ophthalmology;  Laterality: Left;  . PROSTATECTOMY  2000  . RUPTURED GLOBE EXPLORATION AND REPAIR Left 08/16/2019   Procedure: OPEN GLOBE EXPLORATION EYE POSSIBLE ANTERIOR  CHAMBER [place] OUT;  Surgeon: Lonia Skinner, MD;  Location: [place];  Service: Ophthalmology;  Laterality: Left;    Social History   Socioeconomic History  . Marital status: Married    Spouse name: Not on file  . Number of children: 0  . Years of education: Not on file  . Highest education level: Not on file  Occupational History  . Occupation: Retired    Fish farm manager: RETIRED  Tobacco Use  . Smoking status: Never Smoker  . Smokeless tobacco: Never Used  Vaping Use  . Vaping Use: Never used  Substance and Sexual Activity  . Alcohol use: No  . Drug use: No  . Sexual activity: Not on file  Other Topics Concern  . Not on file  Social History Narrative   Married, wife w/ melanoma, advanced, doing well as off 10/2019   no children    Moved to [facility] 03-2016 (  at the independent area as off 10/2018)   Social Determinants of Health   Financial Resource Strain:   . Difficulty of Paying Living Expenses: Not on file  Food Insecurity:   . Worried About Charity fundraiser in the Last Year: Not on file  . Ran Out of Food in the Last Year: Not on file  Transportation Needs:   . Lack of Transportation (Medical): Not on file  . Lack of Transportation (Non-Medical): Not on file  Physical Activity:   . Days of Exercise per Week: Not on file  . Minutes of Exercise per Session: Not on file  Stress:   . Feeling of Stress : Not on file  Social Connections:   .  Frequency of Communication with Friends and Family: Not on file  . Frequency of Social Gatherings with Friends and Family: Not on file  . Attends Religious Services: Not on file  . Active Member of Clubs or Organizations: Not on file  . Attends Archivist Meetings: Not on file  . Marital Status: Not on file  Intimate Partner Violence:   . Fear of Current or Ex-Partner: Not on file  . Emotionally Abused: Not on file  . Physically Abused: Not on file  . Sexually Abused: Not on file      Allergies as of 03/18/2020      Reactions   Nitrofuran Derivatives Other (See Comments)   Caused headaches   Sulfonamide Derivatives Nausea And Vomiting      Medication List       Accurate as of March 18, 2020 11:59 PM. If you have any questions, ask your nurse or doctor.        STOP taking these medications   traMADol 50 MG tablet Commonly known as: Ultram Stopped by: [name], MD     TAKE these medications   acetaminophen 500 MG tablet Commonly known as: TYLENOL Take 1,000 mg by mouth every 6 (six) hours as needed for mild pain or headache.   Align 4 MG Caps Take 4 mg by mouth daily.   apixaban 5 MG Tabs tablet Commonly known as: Eliquis Take 1 tablet (5 mg total) by mouth 2 (two) times daily.   ARTIFICIAL TEARS OP Place 1 drop into both eyes 3 (three) times daily as needed (for dryness).   atenolol 50 MG tablet Commonly known as: TENORMIN Take 1 tablet (50 mg total) by mouth daily.   azelastine 0.1 % nasal spray Commonly known as: ASTELIN Place 2 sprays into both nostrils at bedtime as needed for rhinitis or allergies.   calcium carbonate 500 MG chewable tablet Commonly known as: TUMS - dosed in mg elemental calcium Chew 1-2 tablets by mouth as needed for indigestion or heartburn.   diphenhydrAMINE 25 MG tablet Commonly known as: BENADRYL Take 25 mg by mouth daily as needed for allergies.   diphenhydramine-acetaminophen 25-500 MG Tabs tablet Commonly  known as: TYLENOL PM Take 1 tablet by mouth at bedtime as needed (for sleep).   docusate sodium 100 MG capsule Commonly known as: COLACE Take 100 mg by mouth daily as needed for mild constipation.   dorzolamide-timolol 22.3-6.8 MG/ML ophthalmic solution Commonly known as: COSOPT   fluocinonide 0.05 % external solution Commonly known as: LIDEX Apply 1 application topically See admin instructions. Apply to the scalp  as directed at bedtime   FLUoxetine 20 MG tablet Commonly known as: PROZAC Half tablet daily for 1 week, then 1 tablet daily Started by: [name], MD   fluticasone 50 MCG/ACT nasal spray Commonly known as: FLONASE Place 2 sprays into both nostrils daily as needed for allergies or rhinitis.   folic acid 378 MCG tablet Commonly known as: FOLVITE Take 400 mcg by mouth at bedtime.   multivitamin,tx-minerals tablet Take 1 tablet by mouth at bedtime.   NON FORMULARY Take 1-2 capsules by mouth See admin instructions. Moringa capsules: Take 1 capsule by mouth once a day, alternating with 2 capsules every other day   ondansetron 4 MG tablet Commonly known as: Zofran Take 1 tablet (4 mg total) by mouth every 8 (eight) hours as needed for nausea or vomiting.   polyethylene glycol 17 g packet Commonly known as: MIRALAX / GLYCOLAX Take 17 g by mouth daily. MIX AND DRINK   pravastatin 40 MG tablet Commonly known as: PRAVACHOL Take 1 tablet (40 mg total) by mouth at bedtime.   Travatan Z 0.004 % Soln ophthalmic solution Generic drug: Travoprost (BAK Free) Place 1 drop into both eyes at bedtime.          Objective:   Physical Exam BP (!) 148/74 (BP Location: Right Arm, Patient Position: Sitting, Cuff Size: Small)   Pulse 70   Temp 99.2 F (37.3 C) (Oral)   Resp 18   Ht 6' (1.829 m)   Wt 166 lb 2 oz (75.4 kg)   SpO2 97%   BMI 22.53 kg/m  General:   Well developed, NAD, BMI noted.  HEENT:  Normocephalic . Face symmetric, atraumatic Neck: No JVD at 45  degrees Lymphatic system: No lymphadenopathies at the neck, supraclavicular area, axillary or groin areas Lungs:  CTA B Normal respiratory effort, no intercostal retractions, no accessory muscle use. Heart: RRR,  no murmur.  Abdomen:  Not distended, soft, non-tender. No rebound or rigidity.   Skin: Not pale. Not jaundice Lower extremities: no pretibial edema bilaterally  Neurologic:  alert & oriented X3.  Speech normal, gait appropriate for age and unassisted Psych--  Cognition and judgment appear intact.  Cooperative with normal attention span and concentration.  Behavior appropriate. No anxious or depressed appearing.     Assessment   Assessment Hyperlipidemia Prostate cancer, released from urology 2010, Rx yearly PSAs by PCP HEM: DVT 2001 after surgery; PE 2009, eval by hematology, they recommend DC Coumadin 2011.  Spontaneous right leg DVT 11/2019 H/o MVP-- on BB glaucoma Sees dermatology q 6 months  Facial FX 08-2019, lost L eye vision  PLAN Fatigue, feeling sleepy: Symptoms as described above, physical exam did not reveal any obvious findings.   Cardiac examinationis normal. ROS is + for depression.  Most recent CT chest few weeks ago is stable without pleural effusion or worsening findings. Symptoms are likely multifactorial, will do blood work to rule out a number of etiologies, see orders, will include a sed rate noting that my suspicions for PMR is low and will consider rheumatology referral if the sed rate is high We will start fluoxetine to treat depression which is likely playing a role in his symptoms. Labs: CMP, CBC with pathology review, TSH, free T4, H88, vitamin D, folic acid, sed rate. Depression: See above Reassess 6 weeks    This visit occurred during the SARS-CoV-2 public health emergency.  Safety protocols were in place, including screening questions prior to the visit, additional usage of staff PPE,  and extensive cleaning of exam room while observing  appropriate contact time as indicated for disinfecting solutions.

## 2020-03-19 ENCOUNTER — Other Ambulatory Visit (INDEPENDENT_AMBULATORY_CARE_PROVIDER_SITE_OTHER): Payer: PPO

## 2020-03-19 DIAGNOSIS — R5383 Other fatigue: Secondary | ICD-10-CM | POA: Diagnosis not present

## 2020-03-19 DIAGNOSIS — R531 Weakness: Secondary | ICD-10-CM

## 2020-03-19 LAB — T4, FREE: Free T4: 0.81 ng/dL (ref 0.60–1.60)

## 2020-03-19 LAB — COMPREHENSIVE METABOLIC PANEL
ALT: 20 U/L (ref 0–53)
AST: 18 U/L (ref 0–37)
Albumin: 4.2 g/dL (ref 3.5–5.2)
Alkaline Phosphatase: 69 U/L (ref 39–117)
BUN: 18 mg/dL (ref 6–23)
CO2: 29 mEq/L (ref 19–32)
Calcium: 9.5 mg/dL (ref 8.4–10.5)
Chloride: 105 mEq/L (ref 96–112)
Creatinine, Ser: 1.1 mg/dL (ref 0.40–1.50)
GFR: 60.9 mL/min (ref >60.00)
Glucose, Bld: 83 mg/dL (ref 70–99)
Potassium: 4.6 mEq/L (ref 3.5–5.1)
Sodium: 140 mEq/L (ref 135–145)
Total Bilirubin: 0.6 mg/dL (ref 0.2–1.2)
Total Protein: 6.9 g/dL (ref 6.0–8.3)

## 2020-03-19 LAB — TSH: TSH: 3.18 u[IU]/mL (ref 0.35–4.50)

## 2020-03-19 LAB — B12 AND FOLATE PANEL
Folate: >23.6 ng/mL (ref >5.9)
Vitamin B-12: 471 pg/mL (ref 211–911)

## 2020-03-19 LAB — SEDIMENTATION RATE: Sed Rate: 8 mm/hr (ref 0–20)

## 2020-03-19 LAB — VITAMIN D 25 HYDROXY (VIT D DEFICIENCY, FRACTURES): VITD: 34.64 ng/mL (ref 30.00–100.00)

## 2020-03-19 NOTE — Assessment & Plan Note (Signed)
Fatigue, feeling sleepy: Symptoms as described above, physical exam did not reveal any obvious findings.   Cardiac examinationis normal. ROS is + for depression.  Most recent CT chest few weeks ago is stable without pleural effusion or worsening findings. Symptoms are likely multifactorial, will do blood work to rule out a number of etiologies, see orders, will include a sed rate noting that my suspicions for PMR is low and will consider rheumatology referral if the sed rate is high We will start fluoxetine to treat depression which is likely playing a role in his symptoms. Labs: CMP, CBC with pathology review, TSH, free T4, K88, vitamin D, folic acid, sed rate. Depression: See above Reassess 6 weeks

## 2020-03-19 NOTE — Progress Notes (Signed)
No charge. Redraw.

## 2020-03-20 LAB — CBC WITH DIFFERENTIAL/PLATELET
Absolute Monocytes: 817 cells/uL (ref 200–950)
Basophils Absolute: 22 cells/uL (ref 0–200)
Basophils Relative: 0.5 %
Eosinophils Absolute: 90 cells/uL (ref 15–500)
Eosinophils Relative: 2.1 %
HCT: 44.4 % (ref 38.5–50.0)
Hemoglobin: 15.2 g/dL (ref 13.2–17.1)
Lymphs Abs: 705 cells/uL — ABNORMAL LOW (ref 850–3900)
MCH: 33.2 pg — ABNORMAL HIGH (ref 27.0–33.0)
MCHC: 34.2 g/dL (ref 32.0–36.0)
MCV: 96.9 fL (ref 80.0–100.0)
MPV: 11.3 fL (ref 7.5–12.5)
Monocytes Relative: 19 %
Neutro Abs: 2666 cells/uL (ref 1500–7800)
Neutrophils Relative %: 62 %
Platelets: 138 10*3/uL — ABNORMAL LOW (ref 140–400)
RBC: 4.58 10*6/uL (ref 4.20–5.80)
RDW: 12.2 % (ref 11.0–15.0)
Total Lymphocyte: 16.4 %
WBC: 4.3 10*3/uL (ref 3.8–10.8)

## 2020-03-20 LAB — PATHOLOGIST SMEAR REVIEW

## 2020-04-03 ENCOUNTER — Other Ambulatory Visit: Payer: Self-pay

## 2020-04-03 ENCOUNTER — Encounter: Payer: Self-pay | Admitting: Internal Medicine

## 2020-04-03 ENCOUNTER — Ambulatory Visit (INDEPENDENT_AMBULATORY_CARE_PROVIDER_SITE_OTHER): Payer: PPO | Admitting: Internal Medicine

## 2020-04-03 VITALS — BP 145/71 | HR 65 | Temp 97.8°F | Resp 16 | Ht 72.0 in | Wt 167.0 lb

## 2020-04-03 DIAGNOSIS — R030 Elevated blood-pressure reading, without diagnosis of hypertension: Secondary | ICD-10-CM | POA: Diagnosis not present

## 2020-04-03 NOTE — Progress Notes (Signed)
Subjective:    Patient ID: Brandon Burke, male    DOB: 06-09-1931, 86 y.o.   MRN: [id number]  DOS:  04/03/2020 Type of visit - description: Acute Here with his wife Yesterday, his blood pressure went up to 194/97, he rechecked several times, at the end of the day BP was down to 139/70. He called the answering service and he was recommended to come here. This morning, BP was 148/70 at home.  He denies chest pain, difficulty breathing. No nosebleeds or any other bleeding. No headache or dizziness per se. He has taking few ibuprofen for arm pain.   BP Readings from Last 3 Encounters:  04/03/20 (!) 145/71  03/18/20 (!) 148/74  02/01/20 124/70   Wt Readings from Last 3 Encounters:  04/03/20 167 lb (75.8 kg)  03/18/20 166 lb 2 oz (75.4 kg)  02/01/20 164 lb 3.2 oz (74.5 kg)      Review of Systems See above   Past Medical History:  Diagnosis Date  . Constipation   . DVT (deep venous thrombosis) ([place])    hx of 2001 (after prostate surgery)    . Glaucoma   . Hyperlipidemia   . Hypertension   . Melanoma ([place])    face, hand  . Mitral valve prolapse   . PE (pulmonary embolism)    5-09:was referred to hematology, and they recommend to discontinue Coumadin 5/11  . Prostate cancer [facility])     released from urology in 2010,needs yearly PSAs with PCP  . Seasonal allergies     Past Surgical History:  Procedure Laterality Date  . CATARACT EXTRACTION Bilateral   . COLONOSCOPY     several  . EYE SURGERY Bilateral    Cat Sx  . PARS PLANA VITRECTOMY Left 09/06/2019   Procedure: PARS PLANA VITRECTOMY WITH 25 GAUGE WITH INTRAVITREAL ANTIBIOTICS;  Surgeon: [name], MD;  Location: [place];  Service: Ophthalmology;  Laterality: Left;  . PHOTOCOAGULATION WITH LASER Left 09/06/2019   Procedure: Photocoagulation With Laser;  Surgeon: [name], MD;  Location: [place];  Service: Ophthalmology;  Laterality: Left;  . PROSTATECTOMY  2000  . RUPTURED GLOBE EXPLORATION AND REPAIR Left  08/16/2019   Procedure: OPEN GLOBE EXPLORATION EYE POSSIBLE ANTERIOR  CHAMBER [place] OUT;  Surgeon: Lonia Skinner, MD;  Location: [place];  Service: Ophthalmology;  Laterality: Left;    Allergies as of 04/03/2020      Reactions   Nitrofuran Derivatives Other (See Comments)   Caused headaches   Sulfonamide Derivatives Nausea And Vomiting      Medication List       Accurate as of April 03, 2020 11:59 PM. If you have any questions, ask your nurse or doctor.        acetaminophen 500 MG tablet Commonly known as: TYLENOL Take 1,000 mg by mouth every 6 (six) hours as needed for mild pain or headache.   Align 4 MG Caps Take 4 mg by mouth daily.   apixaban 5 MG Tabs tablet Commonly known as: Eliquis Take 1 tablet (5 mg total) by mouth 2 (two) times daily.   ARTIFICIAL TEARS OP Place 1 drop into both eyes 3 (three) times daily as needed (for dryness).   atenolol 50 MG tablet Commonly known as: TENORMIN Take 1 tablet (50 mg total) by mouth daily.   azelastine 0.1 % nasal spray Commonly known as: ASTELIN Place 2 sprays into both nostrils at bedtime as needed for rhinitis or allergies.   calcium carbonate 500 MG  chewable tablet Commonly known as: TUMS - dosed in mg elemental calcium Chew 1-2 tablets by mouth as needed for indigestion or heartburn.   diphenhydrAMINE 25 MG tablet Commonly known as: BENADRYL Take 25 mg by mouth daily as needed for allergies.   diphenhydramine-acetaminophen 25-500 MG Tabs tablet Commonly known as: TYLENOL PM Take 1 tablet by mouth at bedtime as needed (for sleep).   docusate sodium 100 MG capsule Commonly known as: COLACE Take 100 mg by mouth daily as needed for mild constipation.   dorzolamide-timolol 22.3-6.8 MG/ML ophthalmic solution Commonly known as: COSOPT   fluocinonide 0.05 % external solution Commonly known as: LIDEX Apply 1 application topically See admin instructions. Apply to the scalp as directed at bedtime   FLUoxetine 20 MG  tablet Commonly known as: PROZAC Half tablet daily for 1 week, then 1 tablet daily   fluticasone 50 MCG/ACT nasal spray Commonly known as: FLONASE Place 2 sprays into both nostrils daily as needed for allergies or rhinitis.   folic acid 536 MCG tablet Commonly known as: FOLVITE Take 400 mcg by mouth at bedtime.   multivitamin,tx-minerals tablet Take 1 tablet by mouth at bedtime.   NON FORMULARY Take 1-2 capsules by mouth See admin instructions. [place]a capsules: Take 1 capsule by mouth once a day, alternating with 2 capsules every other day   ondansetron 4 MG tablet Commonly known as: Zofran Take 1 tablet (4 mg total) by mouth every 8 (eight) hours as needed for nausea or vomiting.   polyethylene glycol 17 g packet Commonly known as: MIRALAX / GLYCOLAX Take 17 g by mouth daily. MIX AND DRINK   pravastatin 40 MG tablet Commonly known as: PRAVACHOL Take 1 tablet (40 mg total) by mouth at bedtime.   Travatan Z 0.004 % Soln ophthalmic solution Generic drug: Travoprost (BAK Free) Place 1 drop into both eyes at bedtime.          Objective:   Physical Exam BP (!) 145/71 (BP Location: Right Arm, Patient Position: Sitting, Cuff Size: Small)   Pulse 65   Temp 97.8 F (36.6 C) (Oral)   Resp 16   Ht 6' (1.829 m)   Wt 167 lb (75.8 kg)   SpO2 98%   BMI 22.65 kg/m  General:   Well developed, NAD, BMI noted. HEENT:  Normocephalic . Face symmetric, atraumatic Lungs:  CTA B Normal respiratory effort, no intercostal retractions, no accessory muscle use. Heart: RRR,  no murmur.  Lower extremities: no pretibial edema bilaterally  Skin: Not pale. Not jaundice Neurologic:  alert & oriented X3.  Speech normal, gait appropriate for age and unassisted Psych--  Cognition and judgment appear intact.  Cooperative with normal attention span and concentration.  Behavior appropriate. No anxious or depressed appearing.      Assessment     Assessment Hyperlipidemia Prostate  cancer, released from urology 2010, Rx yearly PSAs by PCP HEM: DVT 2001 after surgery; PE 2009, eval by hematology, they recommend DC Coumadin 2011.  Spontaneous right leg DVT 11/2019 H/o MVP-- on BB glaucoma Sees dermatology q 6 months  Facial FX 08-2019, lost L eye vision  PLAN Elevated BP:  No h/o hypertension, on beta-blockers for MVP. Yesterday his BP was elevated as high as 194/97, he was asymptomatic. We reviewed his BPs from November and they were really good between 114 and 140/60. My recommendation is to continue checking, systolic BP goal would be less than 150, slightly permissive due to age. Also recommend low-salt diet, avoid NSAIDs and take  Tylenol if needed. Call if BPs are elevated, call or ER if there is associated symptoms such as headache, chest pain, nosebleeds.  This visit occurred during the SARS-CoV-2 public health emergency.  Safety protocols were in place, including screening questions prior to the visit, additional usage of staff PPE, and extensive cleaning of exam room while observing appropriate contact time as indicated for disinfecting solutions.

## 2020-04-03 NOTE — Patient Instructions (Signed)
Check the  blood pressure  daily or every other day BP GOAL is between 110/65 and  150/85. If it is consistently higher or lower, let me know   If the blood pressure is very high: > 175/95: please call  Also call if BP is high and you have symptoms

## 2020-04-04 DIAGNOSIS — M79672 Pain in left foot: Secondary | ICD-10-CM | POA: Diagnosis not present

## 2020-04-04 DIAGNOSIS — L602 Onychogryphosis: Secondary | ICD-10-CM | POA: Diagnosis not present

## 2020-04-04 DIAGNOSIS — M79671 Pain in right foot: Secondary | ICD-10-CM | POA: Diagnosis not present

## 2020-04-05 NOTE — Assessment & Plan Note (Signed)
Elevated BP:  No h/o hypertension, on beta-blockers for MVP. Yesterday his BP was elevated as high as 194/97, he was asymptomatic. We reviewed his BPs from November and they were really good between 114 and 140/60. My recommendation is to continue checking, systolic BP goal would be less than 150, slightly permissive due to age. Also recommend low-salt diet, avoid NSAIDs and take Tylenol if needed. Call if BPs are elevated, call or ER if there is associated symptoms such as headache, chest pain, nosebleeds.

## 2020-04-11 ENCOUNTER — Telehealth: Payer: Self-pay | Admitting: Internal Medicine

## 2020-04-11 MED ORDER — CYCLOBENZAPRINE HCL 10 MG PO TABS: 5.0000 mg | ORAL_TABLET | Freq: Two times a day (BID) | ORAL | 0 refills | Status: DC | PRN

## 2020-04-11 NOTE — Telephone Encounter (Signed)
Call back # [phone]  Patient state he hurt his back yesterday and is wondering if you would give him some Flexeril  [id number] [address], [place] Phone:  [phone]  Fax:  [phone]

## 2020-04-11 NOTE — Telephone Encounter (Signed)
Advise patient:  If he has severe pain, the pain is going down to one of his legs, if he has tingling or numbness in the lower extremities or the pain is a result of fall or injury: Needs to be seen  Otherwise okay to take a muscle relaxant in addition to rest and Tylenol.  Watch for drowsiness  Call if not better.  I sent a prescription for Flexeril 10 mg: Half tablet twice a day, okay 1 tablet twice a day if no excessive somnolence.

## 2020-04-11 NOTE — Telephone Encounter (Signed)
Please advise 

## 2020-04-11 NOTE — Addendum Note (Signed)
Addended by: [name] E on: 04/11/2020 10:53 AM   Modules accepted: Orders

## 2020-04-11 NOTE — Telephone Encounter (Signed)
Spoke w/ Pt- informed of recommendations. Pt verbalized understanding.  

## 2020-04-14 ENCOUNTER — Other Ambulatory Visit: Payer: Self-pay

## 2020-04-14 ENCOUNTER — Telehealth: Payer: Self-pay

## 2020-04-14 MED ORDER — CYCLOBENZAPRINE HCL 10 MG PO TABS: 5.0000 mg | ORAL_TABLET | Freq: Two times a day (BID) | ORAL | 0 refills | Status: DC | PRN

## 2020-04-14 NOTE — Telephone Encounter (Signed)
PA approved.   PA Case: [id number], Status: Approved, Coverage Starts on: 04/14/2020 12:00:00 AM, Coverage Ends on: 04/14/2021 12:00:00 AM.

## 2020-04-14 NOTE — Telephone Encounter (Signed)
PA initiated via Covermymeds; KEY: [id number]. Awaiting determination.

## 2020-04-29 ENCOUNTER — Other Ambulatory Visit: Payer: Self-pay

## 2020-04-29 ENCOUNTER — Ambulatory Visit (INDEPENDENT_AMBULATORY_CARE_PROVIDER_SITE_OTHER): Payer: PPO | Admitting: Internal Medicine

## 2020-04-29 VITALS — BP 134/76 | HR 69 | Temp 97.9°F | Ht 72.0 in | Wt 168.0 lb

## 2020-04-29 DIAGNOSIS — D696 Thrombocytopenia, unspecified: Secondary | ICD-10-CM | POA: Diagnosis not present

## 2020-04-29 DIAGNOSIS — Z7901 Long term (current) use of anticoagulants: Secondary | ICD-10-CM

## 2020-04-29 DIAGNOSIS — R251 Tremor, unspecified: Secondary | ICD-10-CM | POA: Diagnosis not present

## 2020-04-29 DIAGNOSIS — R030 Elevated blood-pressure reading, without diagnosis of hypertension: Secondary | ICD-10-CM | POA: Diagnosis not present

## 2020-04-29 LAB — CBC WITH DIFFERENTIAL/PLATELET
Basophils Absolute: 0 10*3/uL (ref 0.0–0.1)
Basophils Relative: 0.6 % (ref 0.0–3.0)
Eosinophils Absolute: 0.2 10*3/uL (ref 0.0–0.7)
Eosinophils Relative: 3.4 % (ref 0.0–5.0)
HCT: 42.8 % (ref 39.0–52.0)
Hemoglobin: 14.7 g/dL (ref 13.0–17.0)
Lymphocytes Relative: 14.8 % (ref 12.0–46.0)
Lymphs Abs: 0.8 10*3/uL (ref 0.7–4.0)
MCHC: 34.4 g/dL (ref 30.0–36.0)
MCV: 96 fl (ref 78.0–100.0)
Monocytes Absolute: 0.6 10*3/uL (ref 0.1–1.0)
Monocytes Relative: 10.9 % (ref 3.0–12.0)
Neutro Abs: 3.9 10*3/uL (ref 1.4–7.7)
Neutrophils Relative %: 70.3 % (ref 43.0–77.0)
Platelets: 160 10*3/uL (ref 150.0–400.0)
RBC: 4.46 Mil/uL (ref 4.22–5.81)
RDW: 12.9 % (ref 11.5–15.5)
WBC: 5.5 10*3/uL (ref 4.0–10.5)

## 2020-04-29 MED ORDER — APIXABAN 2.5 MG PO TABS: 2.5000 mg | ORAL_TABLET | Freq: Two times a day (BID) | ORAL | 3 refills | Status: DC

## 2020-04-29 MED ORDER — FLUOXETINE HCL 20 MG PO TABS: ORAL_TABLET | ORAL | 0 refills | Status: DC

## 2020-04-29 NOTE — Patient Instructions (Addendum)
Continue checking your blood pressures  Okay to stop folic acid  You can change Eliquis to 2.5 mg twice daily.    GO TO THE LAB : Get the blood work     GO TO THE FRONT DESK, PLEASE SCHEDULE YOUR APPOINTMENTS Come back for a checkup in 3 to 4 months

## 2020-04-29 NOTE — Assessment & Plan Note (Signed)
Elevated BP: See last visit, checking BPs at home, all within normal range, 130/67. Depression: See note from 03/18/2020, started fluoxetine, mood is better, if he decides in the near future the need to increase fluoxetine dose he will let me know. Anticoagulation for life: Almost 6 months ago had an acute R leg DVT, currently on Eliquis 5 mg twice daily, decrease dose to 2.5 mg BID indefinitely.  New Rx sent. Thrombocytopenia: Recheck a CBC, no rash or unusual bleeding. Folic acid, wonders why he is taking it, on chart review it has never been low, okay to stop it and continue with a multivitamin. Tremors: See HPI for description, unclear etiology, recommend observation. RTC 3 to 4 months

## 2020-04-29 NOTE — Progress Notes (Signed)
Subjective:    Patient ID: Brandon Burke, male    DOB: [birth year], 86 y.o.   MRN: [id number]  DOS:  04/29/2020 Type of visit - description: Follow-up Today we talk about several issues. In general feeling well. Platelets were recently noted to be low, denies any rash or bleeding. The wife has noted some jerking movements at the arms and lower extremities, mostly when he sleeps but sometimes when he is awake. Patient is unaware of such sxs. This is not associated with mental status changes, nausea or other symptoms.   Review of Systems See above   Past Medical History:  Diagnosis Date  . Constipation   . DVT (deep venous thrombosis) (HCC)    hx of 2001 (after prostate surgery)    . Glaucoma   . Hyperlipidemia   . Hypertension   . Melanoma (HCC)    face, hand  . Mitral valve prolapse   . PE (pulmonary embolism)    5-09:was referred to hematology, and they recommend to discontinue Coumadin 5/11  . Prostate cancer [facility])     released from urology in 2010,needs yearly PSAs with PCP  . Seasonal allergies     Past Surgical History:  Procedure Laterality Date  . CATARACT EXTRACTION Bilateral   . COLONOSCOPY     several  . EYE SURGERY Bilateral    Cat Sx  . PARS PLANA VITRECTOMY Left 09/06/2019   Procedure: PARS PLANA VITRECTOMY WITH 25 GAUGE WITH INTRAVITREAL ANTIBIOTICS;  Surgeon: Rennis Chris, MD;  Location: [facility] OR;  Service: Ophthalmology;  Laterality: Left;  . PHOTOCOAGULATION WITH LASER Left 09/06/2019   Procedure: Photocoagulation With Laser;  Surgeon: Rennis Chris, MD;  Location: [facility] OR;  Service: Ophthalmology;  Laterality: Left;  . PROSTATECTOMY  2000  . RUPTURED GLOBE EXPLORATION AND REPAIR Left 08/16/2019   Procedure: OPEN GLOBE EXPLORATION EYE POSSIBLE ANTERIOR  CHAMBER WASH OUT;  Surgeon: Shon Millet, MD;  Location: [facility] OR;  Service: Ophthalmology;  Laterality: Left;    Allergies as of 04/29/2020      Reactions   Nitrofuran Derivatives Other (See  Comments)   Caused headaches   Sulfonamide Derivatives Nausea And Vomiting      Medication List       Accurate as of April 29, 2020  8:11 PM. If you have any questions, ask your nurse or doctor.        STOP taking these medications   folic acid 400 MCG tablet Commonly known as: FOLVITE Stopped by: Willow Ora, MD     TAKE these medications   acetaminophen 500 MG tablet Commonly known as: TYLENOL Take 1,000 mg by mouth every 6 (six) hours as needed for mild pain or headache.   Align 4 MG Caps Take 4 mg by mouth daily.   apixaban 2.5 MG Tabs tablet Commonly known as: Eliquis Take 1 tablet (2.5 mg total) by mouth 2 (two) times daily. What changed:   medication strength  how much to take Changed by: Bonney Leitz, CMA   ARTIFICIAL TEARS OP Place 1 drop into both eyes 3 (three) times daily as needed (for dryness).   atenolol 50 MG tablet Commonly known as: TENORMIN Take 1 tablet (50 mg total) by mouth daily.   azelastine 0.1 % nasal spray Commonly known as: ASTELIN Place 2 sprays into both nostrils at bedtime as needed for rhinitis or allergies.   calcium carbonate 500 MG chewable tablet Commonly known as: TUMS - dosed in mg elemental calcium Chew 1-2 tablets  by mouth as needed for indigestion or heartburn.   cyclobenzaprine 10 MG tablet Commonly known as: FLEXERIL Take 0.5-1 tablets (5-10 mg total) by mouth 2 (two) times daily as needed for muscle spasms.   diphenhydrAMINE 25 MG tablet Commonly known as: BENADRYL Take 25 mg by mouth daily as needed for allergies.   diphenhydramine-acetaminophen 25-500 MG Tabs tablet Commonly known as: TYLENOL PM Take 1 tablet by mouth at bedtime as needed (for sleep).   docusate sodium 100 MG capsule Commonly known as: COLACE Take 100 mg by mouth daily as needed for mild constipation.   dorzolamide-timolol 22.3-6.8 MG/ML ophthalmic solution Commonly known as: COSOPT   fluocinonide 0.05 % external  solution Commonly known as: LIDEX Apply 1 application topically See admin instructions. Apply to the scalp as directed at bedtime   FLUoxetine 20 MG tablet Commonly known as: PROZAC Take one tablet my mouth daily What changed: additional instructions Changed by: Tora Kindred, CMA   fluticasone 50 MCG/ACT nasal spray Commonly known as: FLONASE Place 2 sprays into both nostrils daily as needed for allergies or rhinitis.   multivitamin,tx-minerals tablet Take 1 tablet by mouth at bedtime.   NON FORMULARY Take 1-2 capsules by mouth See admin instructions. Moringa capsules: Take 1 capsule by mouth once a day, alternating with 2 capsules every other day   ondansetron 4 MG tablet Commonly known as: Zofran Take 1 tablet (4 mg total) by mouth every 8 (eight) hours as needed for nausea or vomiting.   polyethylene glycol 17 g packet Commonly known as: MIRALAX / GLYCOLAX Take 17 g by mouth daily. MIX AND DRINK   pravastatin 40 MG tablet Commonly known as: PRAVACHOL Take 1 tablet (40 mg total) by mouth at bedtime.   Travoprost (BAK Free) 0.004 % Soln ophthalmic solution Commonly known as: TRAVATAN Place 1 drop into both eyes at bedtime.          Objective:   Physical Exam BP 134/76 (BP Location: Left Arm, Patient Position: Sitting, Cuff Size: Large)   Pulse 69   Temp 97.9 F (36.6 C) (Oral)   Ht 6' (1.829 m)   Wt 168 lb (76.2 kg)   SpO2 98%   BMI 22.78 kg/m  General:   Well developed, NAD, BMI noted. HEENT:  Normocephalic . Face symmetric, atraumatic Skin: Not pale. Not jaundice Neurologic:  alert & oriented X3.  Speech normal, gait appropriate for age and unassisted Psych--  Cognition and judgment appear intact.  Cooperative with normal attention span and concentration.  Behavior appropriate. No anxious or depressed appearing.      Assessment     Assessment Hyperlipidemia Prostate cancer, released from urology 2010, Rx yearly PSAs by  PCP HEM: DVT 2001 after surgery; PE 2009, eval by hematology, they recommend DC Coumadin 2011.  Spontaneous right leg DVT 11/2019, anticoag x life (dose decreased to Eliquis 2.5 twice daily on 04/2020) H/o MVP-- on BB glaucoma Sees dermatology q 6 months  Facial FX 08-2019, lost L eye vision  PLAN Elevated BP: See last visit, checking BPs at home, all within normal range, 130/67. Depression: See note from 03/18/2020, started fluoxetine, mood is better, if he decides in the near future the need to increase fluoxetine dose he will let me know. Anticoagulation for life: Almost 6 months ago had an acute R leg DVT, currently on Eliquis 5 mg twice daily, decrease dose to 2.5 mg BID indefinitely.  New Rx sent. Thrombocytopenia: Recheck a CBC, no rash or unusual bleeding. Folic acid,  wonders why he is taking it, on chart review it has never been low, okay to stop it and continue with a multivitamin. Tremors: See HPI for description, unclear etiology, recommend observation. RTC 3 to 4 months   This visit occurred during the SARS-CoV-2 public health emergency.  Safety protocols were in place, including screening questions prior to the visit, additional usage of staff PPE, and extensive cleaning of exam room while observing appropriate contact time as indicated for disinfecting solutions.

## 2020-04-30 DIAGNOSIS — H401111 Primary open-angle glaucoma, right eye, mild stage: Secondary | ICD-10-CM | POA: Diagnosis not present

## 2020-04-30 DIAGNOSIS — H4032X3 Glaucoma secondary to eye trauma, left eye, severe stage: Secondary | ICD-10-CM | POA: Diagnosis not present

## 2020-04-30 DIAGNOSIS — H16212 Exposure keratoconjunctivitis, left eye: Secondary | ICD-10-CM | POA: Diagnosis not present

## 2020-04-30 DIAGNOSIS — H2102 Hyphema, left eye: Secondary | ICD-10-CM | POA: Diagnosis not present

## 2020-04-30 DIAGNOSIS — H4312 Vitreous hemorrhage, left eye: Secondary | ICD-10-CM | POA: Diagnosis not present

## 2020-05-08 ENCOUNTER — Ambulatory Visit: Payer: PPO | Admitting: Internal Medicine

## 2020-05-09 ENCOUNTER — Ambulatory Visit: Payer: PPO | Admitting: Internal Medicine

## 2020-05-13 DIAGNOSIS — H903 Sensorineural hearing loss, bilateral: Secondary | ICD-10-CM | POA: Diagnosis not present

## 2020-05-16 DIAGNOSIS — H903 Sensorineural hearing loss, bilateral: Secondary | ICD-10-CM | POA: Diagnosis not present

## 2020-05-28 DIAGNOSIS — H2102 Hyphema, left eye: Secondary | ICD-10-CM | POA: Diagnosis not present

## 2020-05-28 DIAGNOSIS — H4312 Vitreous hemorrhage, left eye: Secondary | ICD-10-CM | POA: Diagnosis not present

## 2020-05-28 DIAGNOSIS — H401111 Primary open-angle glaucoma, right eye, mild stage: Secondary | ICD-10-CM | POA: Diagnosis not present

## 2020-06-06 ENCOUNTER — Other Ambulatory Visit: Payer: Self-pay

## 2020-06-06 ENCOUNTER — Ambulatory Visit (INDEPENDENT_AMBULATORY_CARE_PROVIDER_SITE_OTHER): Payer: PPO | Admitting: Internal Medicine

## 2020-06-06 VITALS — BP 144/77 | HR 71 | Temp 97.9°F | Ht 72.0 in | Wt 169.0 lb

## 2020-06-06 DIAGNOSIS — R5383 Other fatigue: Secondary | ICD-10-CM | POA: Diagnosis not present

## 2020-06-06 DIAGNOSIS — G4710 Hypersomnia, unspecified: Secondary | ICD-10-CM | POA: Diagnosis not present

## 2020-06-06 NOTE — Patient Instructions (Signed)
Please go back on folic acid  Do not take antihistaminic such as diphenhydramine, Tylenol PM, Claritin or Zyrtec.  In the next few weeks if you like to see the specialist, please let us know  See you in April

## 2020-06-06 NOTE — Progress Notes (Signed)
Subjective:    Patient ID: Brandon Burke, male    DOB: 1932-02-02, 87 y.o.   MRN: [id number]  DOS:  06/06/2020 Type of visit - description: Acute  Here with his wife. His main concern is fatigue: Reports that he gets exhausted after doing normal chores. This thinks the problem is worse over the last few months.  Also feels very sleepy, "could take a nap anytime".   Review of Systems He denies chest pain or difficulty breathing. Walking is not limited by leg pain, back pain, he simply gets exhausted. He feels a sleepy, but denies a snoring or any  unusual dreams. He does have some jerking arm movements at night  from time to time. Previously depressed, currently feels better emotionally.   Past Medical History:  Diagnosis Date  . Constipation   . DVT (deep venous thrombosis) ([place])    hx of 2001 (after prostate surgery)    . Glaucoma   . Hyperlipidemia   . Hypertension   . Melanoma ([place])    face, hand  . Mitral valve prolapse   . PE (pulmonary embolism)    5-09:was referred to hematology, and they recommend to discontinue Coumadin 5/11  . Prostate cancer [facility])     released from urology in 2010,needs yearly PSAs with PCP  . Seasonal allergies     Past Surgical History:  Procedure Laterality Date  . CATARACT EXTRACTION Bilateral   . COLONOSCOPY     several  . EYE SURGERY Bilateral    Cat Sx  . PARS PLANA VITRECTOMY Left 09/06/2019   Procedure: PARS PLANA VITRECTOMY WITH 25 GAUGE WITH INTRAVITREAL ANTIBIOTICS;  Surgeon: [name], MD;  Location: [place];  Service: Ophthalmology;  Laterality: Left;  . PHOTOCOAGULATION WITH LASER Left 09/06/2019   Procedure: Photocoagulation With Laser;  Surgeon: [name], MD;  Location: [place];  Service: Ophthalmology;  Laterality: Left;  . PROSTATECTOMY  2000  . RUPTURED GLOBE EXPLORATION AND REPAIR Left 08/16/2019   Procedure: OPEN GLOBE EXPLORATION EYE POSSIBLE ANTERIOR  CHAMBER [place] OUT;  Surgeon: Lonia Skinner, MD;   Location: [place];  Service: Ophthalmology;  Laterality: Left;    Allergies as of 06/06/2020      Reactions   Nitrofuran Derivatives Other (See Comments)   Caused headaches   Sulfonamide Derivatives Nausea And Vomiting      Medication List       Accurate as of June 06, 2020 11:59 PM. If you have any questions, ask your nurse or doctor.        STOP taking these medications   cyclobenzaprine 10 MG tablet Commonly known as: FLEXERIL Stopped by: [name], MD   diphenhydrAMINE 25 MG tablet Commonly known as: BENADRYL Stopped by: [name], MD   diphenhydramine-acetaminophen 25-500 MG Tabs tablet Commonly known as: TYLENOL PM Stopped by: [name], MD     TAKE these medications   acetaminophen 500 MG tablet Commonly known as: TYLENOL Take 1,000 mg by mouth every 6 (six) hours as needed for mild pain or headache.   Align 4 MG Caps Take 4 mg by mouth daily.   apixaban 2.5 MG Tabs tablet Commonly known as: Eliquis Take 1 tablet (2.5 mg total) by mouth 2 (two) times daily.   ARTIFICIAL TEARS OP Place 1 drop into both eyes 3 (three) times daily as needed (for dryness).   atenolol 50 MG tablet Commonly known as: TENORMIN Take 1 tablet (50 mg total) by mouth daily.   azelastine 0.1 %  nasal spray Commonly known as: ASTELIN Place 2 sprays into both nostrils at bedtime as needed for rhinitis or allergies.   calcium carbonate 500 MG chewable tablet Commonly known as: TUMS - dosed in mg elemental calcium Chew 1-2 tablets by mouth as needed for indigestion or heartburn.   docusate sodium 100 MG capsule Commonly known as: COLACE Take 100 mg by mouth daily as needed for mild constipation.   dorzolamide-timolol 22.3-6.8 MG/ML ophthalmic solution Commonly known as: COSOPT   fluocinonide 0.05 % external solution Commonly known as: LIDEX Apply 1 application topically See admin instructions. Apply to the scalp as directed at bedtime   FLUoxetine 20 MG tablet Commonly known  as: PROZAC Take one tablet my mouth daily   fluticasone 50 MCG/ACT nasal spray Commonly known as: FLONASE Place 2 sprays into both nostrils daily as needed for allergies or rhinitis.   folic acid 102 MCG tablet Commonly known as: FOLVITE Take 400 mcg by mouth daily.   multivitamin,tx-minerals tablet Take 1 tablet by mouth at bedtime.   NON FORMULARY Take 1-2 capsules by mouth See admin instructions. Moringa capsules: Take 1 capsule by mouth once a day, alternating with 2 capsules every other day   ondansetron 4 MG tablet Commonly known as: Zofran Take 1 tablet (4 mg total) by mouth every 8 (eight) hours as needed for nausea or vomiting.   polyethylene glycol 17 g packet Commonly known as: MIRALAX / GLYCOLAX Take 17 g by mouth daily. MIX AND DRINK   pravastatin 40 MG tablet Commonly known as: PRAVACHOL Take 1 tablet (40 mg total) by mouth at bedtime.   Travoprost (BAK Free) 0.004 % Soln ophthalmic solution Commonly known as: TRAVATAN Place 1 drop into both eyes at bedtime.          Objective:   Physical Exam BP (!) 144/77 (BP Location: Right Arm, Patient Position: Sitting, Cuff Size: Large)   Pulse 71   Temp 97.9 F (36.6 C) (Oral)   Ht 6' (1.829 m)   Wt 169 lb (76.7 kg)   SpO2 99%   BMI 22.92 kg/m  General:   Well developed, NAD, BMI noted. HEENT:  Normocephalic . Face symmetric, atraumatic Lungs:  CTA B Normal respiratory effort, no intercostal retractions, no accessory muscle use. Heart: RRR,  no murmur.  Lower extremities: no pretibial edema bilaterally  Skin: Not pale. Not jaundice Neurologic:  alert & oriented X3.  Speech normal, gait appropriate for age and unassisted Psych--  Cognition and judgment appear intact.  Cooperative with normal attention span and concentration.  Behavior appropriate. No anxious or depressed appearing.      Assessment    Assessment Hyperlipidemia Prostate cancer, released from urology 2010, Rx yearly PSAs by  PCP HEM: DVT 2001 after surgery; PE 2009, eval by hematology, they recommend DC Coumadin 2011.  Spontaneous right leg DVT 11/2019, anticoag x life (dose decreased to Eliquis 2.5 twice daily on 04/2020) H/o MVP-- on BB glaucoma Sees dermatology q 6 months  Facial FX 08-2019, lost L eye vision  PLAN Fatigue, feeling sleepy: This is going on for few months, see note from 03/18/2020, labs were okay. I do not think I can pinpoint to any particular etiology that could explain his symptoms, recent BMP, CBC, vitamins, sed rate all satisfactory.  His blood pressure does not seem to be overcontrolled.  Aging  certainly could be playing a role. From time to time he takes an antihistaminic, I recommend to stop it. He stopped folic acid few weeks  ago under my advice because levels were okay, recommend to go back on folic acid. I offered her a referral to neurology to rule out a sleep disorder but he declines for now.  He will let me know if he decides to pursue. Depression: On fluoxetine, PHQ-9 scored 8, subjectively patient feels better RTC  by April as scheduled  Time spent: 30 minutes, reviewing the chart are previous work-up. This visit occurred during the SARS-CoV-2 public health emergency.  Safety protocols were in place, including screening questions prior to the visit, additional usage of staff PPE, and extensive cleaning of exam room while observing appropriate contact time as indicated for disinfecting solutions.

## 2020-06-08 NOTE — Assessment & Plan Note (Signed)
Fatigue, feeling sleepy: This is going on for few months, see note from 03/18/2020, labs were okay. I do not think I can pinpoint to any particular etiology that could explain his symptoms, recent BMP, CBC, vitamins, sed rate all satisfactory.  His blood pressure does not seem to be overcontrolled.  Aging  certainly could be playing a role. From time to time he takes an antihistaminic, I recommend to stop it. He stopped folic acid few weeks ago under my advice because levels were okay, recommend to go back on folic acid. I offered her a referral to neurology to rule out a sleep disorder but he declines for now.  He will let me know if he decides to pursue. Depression: On fluoxetine, PHQ-9 scored 8, subjectively patient feels better RTC  by April as scheduled

## 2020-06-12 DIAGNOSIS — L821 Other seborrheic keratosis: Secondary | ICD-10-CM | POA: Diagnosis not present

## 2020-06-12 DIAGNOSIS — L570 Actinic keratosis: Secondary | ICD-10-CM | POA: Diagnosis not present

## 2020-06-12 DIAGNOSIS — Z85828 Personal history of other malignant neoplasm of skin: Secondary | ICD-10-CM | POA: Diagnosis not present

## 2020-06-12 DIAGNOSIS — D224 Melanocytic nevi of scalp and neck: Secondary | ICD-10-CM | POA: Diagnosis not present

## 2020-06-12 DIAGNOSIS — D225 Melanocytic nevi of trunk: Secondary | ICD-10-CM | POA: Diagnosis not present

## 2020-07-06 ENCOUNTER — Encounter: Payer: Self-pay | Admitting: Internal Medicine

## 2020-07-06 DIAGNOSIS — Z0189 Encounter for other specified special examinations: Secondary | ICD-10-CM

## 2020-07-17 NOTE — Progress Notes (Signed)
[facility] Note  07/21/2020     CHIEF COMPLAINT Patient presents for Retina Follow Up   HISTORY OF PRESENT ILLNESS: Brandon Burke is a 87 y.o. male who presents to the clinic today for:   HPI    Retina Follow Up    Patient presents with  Other.  In left eye.  This started 6 months ago.  I, the attending physician,  performed the HPI with the patient and updated documentation appropriately.          Comments    Patient here for 6 months retina follow up for trauma OS. Patient states vision not good. Very little vision OS. Patient came in wearing a patch over OS. Says eye sensitive to light and wind. Has dry eye in wind- feels scratchy.       Last edited by [name], MD on 07/21/2020  1:17 PM. (History)    pt saw Dr. Eulas Post at the end of January, he states Dr. Eulas Post just told him to f/u in 3 months, pt also saw Dr. Sabino Dick at [place] and had sx schedule with him, but cancelled it bc he has blood clots in his legs and needs to be on blood thinners, pt states he has been wearing a patch over the left eye bc it makes it feel better  Referring physician: Lonia Skinner, MD [place],  [place] [id number]  HISTORICAL INFORMATION:   Selected notes from the MEDICAL RECORD NUMBER Referred by Dr. Gala Romney for B-scan   CURRENT MEDICATIONS: Current Outpatient Medications (Ophthalmic Drugs)  Medication Sig  . dorzolamide-timolol (COSOPT) 22.3-6.8 MG/ML ophthalmic solution   . Hypromellose (ARTIFICIAL TEARS OP) Place 1 drop into both eyes 3 (three) times daily as needed (for dryness).   . Travoprost, BAK Free, (TRAVATAN) 0.004 % SOLN ophthalmic solution Place 1 drop into both eyes at bedtime.   No current facility-administered medications for this visit. (Ophthalmic Drugs)   Current Outpatient Medications (Other)  Medication Sig  . acetaminophen (TYLENOL) 500 MG tablet Take 1,000 mg by mouth every 6 (six) hours as needed  for mild pain or headache.  Marland Kitchen apixaban (ELIQUIS) 2.5 MG TABS tablet Take 1 tablet (2.5 mg total) by mouth 2 (two) times daily.  Marland Kitchen atenolol (TENORMIN) 50 MG tablet Take 1 tablet (50 mg total) by mouth daily.  Marland Kitchen azelastine (ASTELIN) 0.1 % nasal spray Place 2 sprays into both nostrils at bedtime as needed for rhinitis or allergies.  . calcium carbonate (TUMS - DOSED IN MG ELEMENTAL CALCIUM) 500 MG chewable tablet Chew 1-2 tablets by mouth as needed for indigestion or heartburn.  . docusate sodium (COLACE) 100 MG capsule Take 100 mg by mouth daily as needed for mild constipation.  . fluocinonide (LIDEX) 0.05 % external solution Apply 1 application topically See admin instructions. Apply to the scalp as directed at bedtime  . FLUoxetine (PROZAC) 20 MG tablet Take one tablet my mouth daily  . fluticasone (FLONASE) 50 MCG/ACT nasal spray Place 2 sprays into both nostrils daily as needed for allergies or rhinitis.  . folic acid (FOLVITE) 563 MCG tablet Take 400 mcg by mouth daily.  . Multiple Vitamins-Minerals (MULTIVITAMIN,TX-MINERALS) tablet Take 1 tablet by mouth at bedtime.  . NON FORMULARY Take 1-2 capsules by mouth See admin instructions. [place] capsules: Take 1 capsule by mouth once a day, alternating with 2 capsules every other day  . ondansetron (ZOFRAN) 4 MG tablet Take 1 tablet (  4 mg total) by mouth every 8 (eight) hours as needed for nausea or vomiting.  . polyethylene glycol (MIRALAX / GLYCOLAX) packet Take 17 g by mouth daily. [place]  . pravastatin (PRAVACHOL) 40 MG tablet Take 1 tablet (40 mg total) by mouth at bedtime.  . Probiotic Product (ALIGN) 4 MG CAPS Take 4 mg by mouth daily.   No current facility-administered medications for this visit. (Other)      REVIEW OF SYSTEMS: ROS    Positive for: Gastrointestinal, Musculoskeletal, Cardiovascular, Eyes   Negative for: Constitutional, Neurological, Skin, Genitourinary, HENT, Endocrine, Respiratory, Psychiatric, Allergic/Imm,  Heme/Lymph   Last edited by Theodore Demark, COA on 07/21/2020  9:42 AM. (History)       ALLERGIES Allergies  Allergen Reactions  . Nitrofuran Derivatives Other (See Comments)    Caused headaches   . Sulfonamide Derivatives Nausea And Vomiting    PAST MEDICAL HISTORY Past Medical History:  Diagnosis Date  . Constipation   . DVT (deep venous thrombosis) ([place])    hx of 2001 (after prostate surgery)    . Glaucoma   . Hyperlipidemia   . Hypertension   . Melanoma ([place])    face, hand  . Mitral valve prolapse   . PE (pulmonary embolism)    5-09:was referred to hematology, and they recommend to discontinue Coumadin 5/11  . Prostate cancer [facility])     released from urology in 2010,needs yearly PSAs with PCP  . Seasonal allergies    Past Surgical History:  Procedure Laterality Date  . CATARACT EXTRACTION Bilateral   . COLONOSCOPY     several  . EYE SURGERY Bilateral    Cat Sx  . PARS PLANA VITRECTOMY Left 09/06/2019   Procedure: PARS PLANA VITRECTOMY WITH 25 GAUGE WITH INTRAVITREAL ANTIBIOTICS;  Surgeon: [name], MD;  Location: [place];  Service: Ophthalmology;  Laterality: Left;  . PHOTOCOAGULATION WITH LASER Left 09/06/2019   Procedure: Photocoagulation With Laser;  Surgeon: [name], MD;  Location: [place];  Service: Ophthalmology;  Laterality: Left;  . PROSTATECTOMY  2000  . RUPTURED GLOBE EXPLORATION AND REPAIR Left 08/16/2019   Procedure: OPEN GLOBE EXPLORATION EYE POSSIBLE ANTERIOR  CHAMBER [place] OUT;  Surgeon: Lonia Skinner, MD;  Location: [place];  Service: Ophthalmology;  Laterality: Left;    FAMILY HISTORY Family History  Problem Relation Age of Onset  . CAD Father 20  . Hypertension Father   . Glaucoma Mother   . Diabetes Neg Hx   . Colon cancer Neg Hx   . Prostate cancer Neg Hx     SOCIAL HISTORY Social History   Tobacco Use  . Smoking status: Never Smoker  . Smokeless tobacco: Never Used  Vaping Use  . Vaping Use: Never used  Substance Use  Topics  . Alcohol use: No  . Drug use: No         OPHTHALMIC EXAM:  Base Eye Exam    Visual Acuity (Snellen - Linear)      Right Left   Dist cc 20/40 -2 CF at 2'   Dist ph cc 20/40 +2 NI   Correction: Glasses       Tonometry (Tonopen, 9:37 AM)      Right Left   Pressure 19 15       Pupils      Dark Light Shape React APD   Right 4 3 Round Brisk None   Left 6 6 Irregular No None       Visual  Fields (Counting fingers)      Left Right     Full   Restrictions Total inferior temporal, inferior nasal deficiencies; Partial outer superior temporal, superior nasal deficiencies        Extraocular Movement      Right Left    Full -3    -- -- --  --  --  -- -- --   -- -- --  --  --  -- -- --         Neuro/Psych    Oriented x3: Yes   Mood/Affect: Normal       Dilation    Both eyes: 1.0% Mydriacyl, 2.5% Phenylephrine @ 9:37 AM        Slit Lamp and Fundus Exam    External Exam      Right Left   External  Enophthalmos       Slit Lamp Exam      Right Left   Lids/Lashes Dermatochalasis - upper lid, Meibomian gland dysfunction Ptosis, lagophthalmos, incomplete blink, lash ptosis   Conjunctiva/Sclera White and quiet White and quiet   Cornea Arcus, central haze/crocodile shegreen  Arcus, central haze/crocodile shagreen, 1-2+ Punctate epithelial erosions, irregular epi with thinning inferiorly -- improved   Anterior Chamber deep, narrow temporal angle Deep, blood stained vitreous prolapse temporally -- improved   Iris Round and dilated Round and dilated, severely atrophic   Lens PC IOL in good position PC IOL in good position   Vitreous Vitreous syneresis, Posterior vitreous detachment post vitrectomy, clear       Fundus Exam      Right Left   Disc Pink and Sharp, mild PPP 3+pallor, +cupping, very thin superior rim, Sharp rim   C/D Ratio 0.5 0.85   Macula Flat, Blunted foveal reflex, mild RPE mottling and clumping, No heme or edema Flat, Good foveal reflex, mild  Retinal pigment epithelial mottling, No heme or edema   Vessels attenuated, Tortuous, mild AV crossing changes mild attenuation, mild tortuousity   Periphery Attached, mild reticular degeneration, No RT/RD or heme Attached, view limited by restricted EOM           Refraction    Wearing Rx      Sphere Cylinder Axis Add   Right -1.75 +1.00 167 +2.50   Left -1.50 +2.25 177 +2.50   Type: Bifocal          IMAGING AND PROCEDURES  Imaging and Procedures for @TODAY @  OCT, Retina - OU - Both Eyes       Right Eye Quality was good. Central Foveal Thickness: 306. Progression has been stable. Findings include normal foveal contour, no IRF, no SRF.   Left Eye Quality was good. Central Foveal Thickness: 311. Progression has been stable. Findings include no SRF, no IRF, abnormal foveal contour (Mild central ellipsoid thinning; trace ERM non-central).   Notes *Images captured and stored on drive  Diagnosis / Impression:  OD: NFP, no IRF/SRF OS: NFP; no IRF/SRF; Mild central ellipsoid thinning, trace, non-central ERM  Clinical management:  See below  Abbreviations: NFP - Normal foveal profile. CME - cystoid macular edema. PED - pigment epithelial detachment. IRF - intraretinal fluid. SRF - subretinal fluid. EZ - ellipsoid zone. ERM - epiretinal membrane. ORA - outer retinal atrophy. ORT - outer retinal tubulation. SRHM - subretinal hyper-reflective material                 ASSESSMENT/PLAN:    ICD-10-CM   1. Left eye injury, sequela  S05.92XS  2. Hyphema of left eye  H21.02   3. Vitreous prolapse of left eye  H43.02   4. Retinal edema  H35.81 OCT, Retina - OU - Both Eyes  5. Closed fracture of orbit, sequela ([place])  S02.85XS   6. Vitreous hemorrhage of left eye (HCC)  H43.12   7. Primary open angle glaucoma of both eyes, unspecified glaucoma stage  H40.1130   8. Pseudophakia of both eyes  Z96.1     1-6. Eye trauma with orbital fractures, hyphema, vitreous prolapse and  vitreous hemorrhage OS  - mechanism of injury -- fall on 04.08.21  - under the expert management of Dr. Eulas Post  - underwent exploration with Dr. Eulas Post on 04.15.21  - no globe rupture  - initially had elevated IOPs in the setting of history of POAG managed by Dr. Jerline Pain  - significant debris and old heme persisted in the [facility] and vitreous cavity  - s/p PPV w/ EL and intravitreal abx OS, 05.06.21             - intra-op -- retina in good shape without RT/RD, +restricted EOM  - vitreous cultures for fungus and bacteria 05.06.21 -- no growth             - exam today: retina attached with vitreous clear  - IOP stably improved -- 15 OS today  - exam today shows +enophthalmos, persistent EOM restriction; +lagophthalmos w/ incomplete blink  - recommend eval with oculoplastics -- discussed with Dr. Eulas Post who has referred to Dr. Sabino Dick at [facility]   - Dr. Sabino Dick recommended a tarsorrhaphy, but procedure was canceled due to blood clots in pts legs             - now just taking ATs and Cosopt BID OU  - overall, retina stable  - pt is cleared from a retina standpoint for release to Dr. Eulas Post and resumption of primary eye care  7. History of POAG  8. Pseudophakia OU  - s/p CE/IOL OU  - IOLs in good position   Ophthalmic Meds Ordered this visit:  No orders of the defined types were placed in this encounter.      Return if symptoms worsen or fail to improve.  There are no Patient Instructions on file for this visit.  This document serves as a record of services personally performed by Gardiner Sleeper, MD, PhD. It was created on their behalf by Leeann Must, [place], an ophthalmic technician. The creation of this record is the provider's dictation and/or activities during the visit.    Electronically signed by: Leeann Must, [place] 03.17.2022 1:19 PM  This document serves as a record of services personally performed by Gardiner Sleeper, MD, PhD. It was created on their behalf by San Jetty. Owens Shark, OA  an ophthalmic technician. The creation of this record is the provider's dictation and/or activities during the visit.    Electronically signed by: San Jetty. Owens Shark, [place] 03.21.2022 1:19 PM  Gardiner Sleeper, M.D., Ph.D. Diseases & Surgery of the Retina and [place] 07/21/2020   I have reviewed the above documentation for accuracy and completeness, and I agree with the above. Gardiner Sleeper, M.D., Ph.D. 07/21/20 1:19 PM  Abbreviations: M myopia (nearsighted); A astigmatism; H hyperopia (farsighted); P presbyopia; Mrx spectacle prescription;  CTL contact lenses; OD right eye; OS left eye; OU both eyes  XT exotropia; ET esotropia; PEK punctate epithelial keratitis; PEE punctate epithelial erosions; DES dry eye syndrome; MGD  meibomian gland dysfunction; ATs artificial tears; PFAT's preservative free artificial tears; [place] nuclear sclerotic cataract; PSC posterior subcapsular cataract; ERM epi-retinal membrane; PVD posterior vitreous detachment; RD retinal detachment; DM diabetes mellitus; DR diabetic retinopathy; NPDR non-proliferative diabetic retinopathy; PDR proliferative diabetic retinopathy; CSME clinically significant macular edema; DME diabetic macular edema; dbh dot blot hemorrhages; CWS cotton wool spot; POAG primary open angle glaucoma; C/D cup-to-disc ratio; HVF humphrey visual field; GVF goldmann visual field; OCT optical coherence tomography; IOP intraocular pressure; BRVO Branch retinal vein occlusion; CRVO central retinal vein occlusion; CRAO central retinal artery occlusion; BRAO branch retinal artery occlusion; RT retinal tear; SB scleral buckle; PPV pars plana vitrectomy; VH Vitreous hemorrhage; PRP panretinal laser photocoagulation; IVK intravitreal kenalog; VMT vitreomacular traction; MH Macular hole;  NVD neovascularization of the disc; NVE neovascularization elsewhere; AREDS age related eye disease study; ARMD age related macular degeneration; POAG primary  open angle glaucoma; EBMD epithelial/anterior basement membrane dystrophy; ACIOL anterior chamber intraocular lens; IOL intraocular lens; PCIOL posterior chamber intraocular lens; Phaco/IOL phacoemulsification with intraocular lens placement; [place] photorefractive keratectomy; LASIK laser assisted in situ keratomileusis; HTN hypertension; DM diabetes mellitus; COPD chronic obstructive pulmonary disease

## 2020-07-18 DIAGNOSIS — M79672 Pain in left foot: Secondary | ICD-10-CM | POA: Diagnosis not present

## 2020-07-18 DIAGNOSIS — M79671 Pain in right foot: Secondary | ICD-10-CM | POA: Diagnosis not present

## 2020-07-18 DIAGNOSIS — L602 Onychogryphosis: Secondary | ICD-10-CM | POA: Diagnosis not present

## 2020-07-21 ENCOUNTER — Ambulatory Visit (INDEPENDENT_AMBULATORY_CARE_PROVIDER_SITE_OTHER): Payer: PPO | Admitting: Ophthalmology

## 2020-07-21 ENCOUNTER — Other Ambulatory Visit: Payer: Self-pay

## 2020-07-21 ENCOUNTER — Encounter (INDEPENDENT_AMBULATORY_CARE_PROVIDER_SITE_OTHER): Payer: Self-pay | Admitting: Ophthalmology

## 2020-07-21 DIAGNOSIS — H4312 Vitreous hemorrhage, left eye: Secondary | ICD-10-CM

## 2020-07-21 DIAGNOSIS — H3581 Retinal edema: Secondary | ICD-10-CM

## 2020-07-21 DIAGNOSIS — S0592XS Unspecified injury of left eye and orbit, sequela: Secondary | ICD-10-CM | POA: Diagnosis not present

## 2020-07-21 DIAGNOSIS — H2102 Hyphema, left eye: Secondary | ICD-10-CM | POA: Diagnosis not present

## 2020-07-21 DIAGNOSIS — S0285XS Fracture of orbit, unspecified, sequela: Secondary | ICD-10-CM | POA: Diagnosis not present

## 2020-07-21 DIAGNOSIS — Z961 Presence of intraocular lens: Secondary | ICD-10-CM

## 2020-07-21 DIAGNOSIS — H4302 Vitreous prolapse, left eye: Secondary | ICD-10-CM

## 2020-07-21 DIAGNOSIS — H401130 Primary open-angle glaucoma, bilateral, stage unspecified: Secondary | ICD-10-CM | POA: Diagnosis not present

## 2020-08-05 ENCOUNTER — Ambulatory Visit: Payer: PPO | Admitting: Neurology

## 2020-08-05 ENCOUNTER — Encounter: Payer: Self-pay | Admitting: Neurology

## 2020-08-05 VITALS — BP 138/76 | HR 63 | Ht 72.0 in | Wt 170.0 lb

## 2020-08-05 DIAGNOSIS — S0232XS Fracture of orbital floor, left side, sequela: Secondary | ICD-10-CM

## 2020-08-05 DIAGNOSIS — I341 Nonrheumatic mitral (valve) prolapse: Secondary | ICD-10-CM

## 2020-08-05 DIAGNOSIS — Z86711 Personal history of pulmonary embolism: Secondary | ICD-10-CM | POA: Diagnosis not present

## 2020-08-05 DIAGNOSIS — R5382 Chronic fatigue, unspecified: Secondary | ICD-10-CM

## 2020-08-05 DIAGNOSIS — G4719 Other hypersomnia: Secondary | ICD-10-CM | POA: Diagnosis not present

## 2020-08-05 DIAGNOSIS — G253 Myoclonus: Secondary | ICD-10-CM

## 2020-08-05 NOTE — Progress Notes (Signed)
SLEEP MEDICINE CLINIC    Provider:  Larey Seat, MD  Primary Care Physician:  Brandon Burke, [address] [place] [address]     Referring Provider: Colon Burke, [address],  [place] [address]          Chief Complaint according to patient   Patient presents with:    . New Patient (Initial Visit)     Pt with wife, rm 70. Presents today to address fatigue concerns. The patient stated last year he had a fall which caused him to loose vision in left eye. Since then he has had to retrain ways of getting around. He states also over this course of time he has developed fatigue and tires easily. Wife denies witnessing snoring or apnea, did state in last 5/6 mths he has developed jerking motions in sleep. EDS - Never had a PSG/ HST.      HISTORY OF PRESENT ILLNESS:  Brandon Burke is a 87- year- old Caucasian male patient and seen upon referral  from Brandon Burke on 08/05/2020  for a  sleep study.  Chief concern according to patient : Brandon. Larose Burke have related to a note from 4 February that stated that the patient had last laboratory checkups with him in 03-18-20 and there was no particular etiology found on the blood tests that would explain the patient's excessive daytime sleepiness or fatigue.  Blood pressure was not found to be over controlled, from time to time the patient takes an antihistamine medication he had stopped taking folate acid a couple of weeks prior to the phone call and was asked to resume that back.  He is sent here today to rule out a sleep disorder and also to see if other factors may contribute to the degree of sleepiness.  The patient has a history of depression but on his PHQ-9 score he scored 8 points and subjectively reports feeling better on fluoxetine the generic form of Prozac.  Tylenol PM Benadryl and Flexeril were taken off the medication list at the time of the referral.     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 Cataract,  Constipation, DVT (deep venous thrombosis) leading repeatedly to PE-s [facility]), Glaucoma, Hyperlipidemia, Hypertension, Melanoma ([place]), Mitral valve prolapse,  Multiple PE (pulmonary embolism 200- 2022), Prostate cancer ([place]), and Seasonal allergies.  The patient never has had a sleep study.    Sleep relevant medical history: Nocturia once- twice, eye enucleation, repeated PEs.  Depression.    Family medical /sleep history: Younger brother on CPAP with OSA, insomnia, no sleep walkers.    Social history:  Patient is retired from [employer] [place] [employer] - travelled [place].  and lives in a household with spouse -no children and no pets.  Pets are ** present. Tobacco use- never .  ETOH use ; none ,  Caffeine intake in form of Coffee( 1 cup a day) Soda( not since retirment) Tea (/) or energy drinks. Regular exercise in form of -not in the last 6 month. .         Sleep habits are as follows: The patient's dinner time is between 6 PM. The patient goes to bed at 10-11 PM and continues to sleep for 7-8 hours, wakes for one bathroom breaks, the first time at 2-3 AM.  The preferred sleep position is laterally, on his right , with the support of 1 pillow.  Dreams are reportedly rare.  7.30 AM is the usual rise time. The patient woken up by wife .  He  reports not feeling refreshed or restored in AM, with symptoms such as dry mouth,  Dry eyes, postnasal drip,  morning  fatigue. Naps are taken frequently, lasting from 30 to 120 minutes .    Review of Systems: Out of a complete 14 system review, the patient complains of only the following symptoms, and all other reviewed systems are negative.:  Fatigue, sleepiness , snoring, fragmented sleep, I prolonged sleep time, naps. EDS, sleep myoclonus.   How likely are you to doze in the following situations: 0 = not likely, 1 = slight  chance, 2 = moderate chance, 3 = high chance   Sitting and Reading? Watching Television? Sitting inactive in a public place (theater or meeting)? As a passenger in a car for an hour without a break? Lying down in the afternoon when circumstances permit? Sitting and talking to someone? Sitting quietly after lunch without alcohol? In a car, while stopped for a few minutes in traffic?   Total =12-16/ 24 points   FSS endorsed at 57/ 63 points.   Social History   Socioeconomic History  . Marital status: Married    Spouse name: Not on file  . Number of children: 0  . Years of education: Not on file  . Highest education level: Not on file  Occupational History  . Occupation: Retired    Fish farm manager: RETIRED  Tobacco Use  . Smoking status: Never Smoker  . Smokeless tobacco: Never Used  Vaping Use  . Vaping Use: Never used  Substance and Sexual Activity  . Alcohol use: No  . Drug use: No  . Sexual activity: Not on file  Other Topics Concern  . Not on file  Social History Narrative   Married, wife w/ melanoma, advanced, doing well as off 10/2019   no children    Moved to [facility] 03-2016 (  at the independent area as off 10/2018)   Social Determinants of Health   Financial Resource Strain: Not on file  Food Insecurity: Not on file  Transportation Needs: Not on file  Physical Activity: Not on file  Stress: Not on file  Social Connections: Not on file    Family History  Problem Relation Age of Onset  . CAD Father 27  . Hypertension Father   . Glaucoma Mother   . Diabetes Neg Hx   . Brandon cancer Neg Hx   . Prostate cancer Neg Hx     Past Medical History:  Diagnosis Date  . Constipation   . DVT (deep venous thrombosis) ([place]) and many more since 3 times.     hx of 2001 (after prostate surgery)    . Glaucoma   . Hyperlipidemia   . Hypertension   . Melanoma ([place])    face, hand  . Mitral valve prolapse   . PE (pulmonary embolism) repeatedly.     5-09:was  referred to hematology, and they recommend to discontinue Coumadin 5/11  . Prostate cancer [facility])     released from urology in 2010,needs yearly PSAs with PCP  . Seasonal allergies, post nasal drip     Past Surgical History:  Procedure Laterality Date  . CATARACT EXTRACTION Bilateral   . COLONOSCOPY     several  . EYE SURGERY Bilateral    Cat  Sx  . PARS PLANA VITRECTOMY Left 09/06/2019   Procedure: PARS PLANA VITRECTOMY WITH 25 GAUGE WITH INTRAVITREAL ANTIBIOTICS;  Surgeon: [name], MD;  Location: [facility];  Service: Ophthalmology;  Laterality: Left;  . PHOTOCOAGULATION WITH LASER Left 09/06/2019   Procedure: Photocoagulation With Laser;  Surgeon: [name], MD;  Location: [facility];  Service: Ophthalmology;  Laterality: Left;  . PROSTATECTOMY  2000  . RUPTURED GLOBE EXPLORATION AND REPAIR-   Left 08/16/2019   Procedure: OPEN GLOBE EXPLORATION EYE POSSIBLE ANTERIOR  CHAMBER [place] OUT;  Surgeon: Lonia Skinner, MD;  Location: [facility];  Service: Ophthalmology;  Laterality: Left;     Current Outpatient Medications on File Prior to Visit  Medication Sig Dispense Refill  . acetaminophen (TYLENOL) 500 MG tablet Take 1,000 mg by mouth every 6 (six) hours as needed for mild pain or headache.    Marland Kitchen apixaban (ELIQUIS) 2.5 MG TABS tablet Take 1 tablet (2.5 mg total) by mouth 2 (two) times daily. 90 tablet 3  . atenolol (TENORMIN) 50 MG tablet Take 1 tablet (50 mg total) by mouth daily. 90 tablet 1  . azelastine (ASTELIN) 0.1 % nasal spray Place 2 sprays into both nostrils at bedtime as needed for rhinitis or allergies. 30 mL 12  . calcium carbonate (TUMS - DOSED IN MG ELEMENTAL CALCIUM) 500 MG chewable tablet Chew 1-2 tablets by mouth as needed for indigestion or heartburn.    . docusate sodium (COLACE) 100 MG capsule Take 100 mg by mouth daily as needed for mild constipation.    . dorzolamide-timolol (COSOPT) 22.3-6.8 MG/ML ophthalmic solution     . fluocinonide (LIDEX) 0.05 % external solution  Apply 1 application topically See admin instructions. Apply to the scalp as directed at bedtime    . FLUoxetine (PROZAC) 20 MG tablet Take one tablet my mouth daily 90 tablet 0  . fluticasone (FLONASE) 50 MCG/ACT nasal spray Place 2 sprays into both nostrils daily as needed for allergies or rhinitis. 16 g 5  . folic acid (FOLVITE) 599 MCG tablet Take 400 mcg by mouth daily.    . Hypromellose (ARTIFICIAL TEARS OP) Place 1 drop into both eyes 3 (three) times daily as needed (for dryness).     . Multiple Vitamins-Minerals (MULTIVITAMIN,TX-MINERALS) tablet Take 1 tablet by mouth at bedtime.    . NON FORMULARY Take 1-2 capsules by mouth See admin instructions. [place] capsules: Take 1 capsule by mouth once a day, alternating with 2 capsules every other day    . polyethylene glycol (MIRALAX / GLYCOLAX) packet Take 17 g by mouth daily. [place]    . pravastatin (PRAVACHOL) 40 MG tablet Take 1 tablet (40 mg total) by mouth at bedtime.    . Probiotic Product (ALIGN) 4 MG CAPS Take 4 mg by mouth daily.    . Travoprost, BAK Free, (TRAVATAN) 0.004 % SOLN ophthalmic solution Place 1 drop into both eyes at bedtime.     No current facility-administered medications on file prior to visit.    Allergies  Allergen Reactions  . Nitrofuran Derivatives Other (See Comments)    Caused headaches   . Sulfonamide Derivatives Nausea And Vomiting    Physical exam:  Today's Vitals   08/05/20 1251  BP: 138/76  Pulse: 63  Weight: 170 lb (77.1 kg)  Height: 6' (1.829 m)   Body mass index is 23.06 kg/m.   Wt Readings from Last 3 Encounters:  08/05/20 170 lb (77.1 kg)  06/06/20 169 lb (76.7 kg)  04/29/20  168 lb (76.2 kg)     Ht Readings from Last 3 Encounters:  08/05/20 6' (1.829 m)  06/06/20 6' (1.829 m)  04/29/20 6' (1.829 m)      General: The patient is awake, alert and appears not in acute distress. The patient is well groomed. Head: Normocephalic, atraumatic. Neck is supple.  Mallampati 2,   neck circumference:15.5  inches .  Nasal airflow *is patent.  Retrognathia is not  seen.  Dental status: biological  Cardiovascular:  Regular rate and cardiac rhythm by pulse,  without distended neck veins. Respiratory: Lungs are clear to auscultation.  Skin:  Without evidence of ankle edema, or rash. Trunk: The patient's posture is erect.   Neurologic exam : The patient is awake and alert, oriented to place and time.   Memory subjective described as intact.  Attention span & concentration ability appears normal.  Speech is fluent,  without  dysarthria, dysphonia or aphasia.  Mood and affect are appropriate.   Cranial nerves: no loss of smell or taste reported   right Pupils are equal and briskly reactive to light. Funduscopic exam deferred.  Extraocular movements in vertical and horizontal planes were intact and without nystagmus. No Diplopia. Visual fields by finger perimetry are intact. Hearing was intact to soft voice and finger rubbing.    Facial sensation intact to fine touch.  Facial motor strength is symmetric and tongue and uvula move midline.  Neck ROM : rotation, tilt and flexion extension were normal for age and shoulder shrug was symmetrical.    Motor exam:  Symmetric bulk, tone and ROM.   Abnormal elevated  tone with cog -wheeling, and jerking. Tremor noted  symmetrically weaker grip strength . Sensory:   bilaterally upper arms cramping.  Sometimes on one side at a time, wakes up form naps.  Fine touch  and vibration were felt in the upper extremities but not at either patella and blow.  Proprioception tested in the upper extremities was normal.   Coordination: Rapid alternating movements in the fingers/hands were of normal speed.  The Finger-to-nose maneuver was intact without evidence of ataxia, dysmetria or tremor.tremor not with action.    Gait and station: Patient could rise unassisted from a seated position, walked cautiously without assistive device. Normal  arm swing,  Turning 5 steps.  Stance is of normal width/ base . He reports having trouble walking on uneven ground, and walking on carpet.   Toe and heel walk were deferred.  Deep tendon reflexes: in the  upper and lower extremities are brisk- and  symmetric . Babinski response was deferred .    Labs reviewed.  After spending a total time of 60 minutes face to face and additional time for physical and neurologic examination, review of laboratory studies,  personal review of imaging studies, reports and results of other testing and review of referral information / records as far as provided in visit, I have established the following assessments:  EDS worsening over 2-3 years and much worse over the last 6 month.   1) EDS: high index of fatigue , sleeping a lot, and napping a lot, hypersomnia,  2) not a loud snorer. not a narrow airway,  but history of DVT and PE ( several )  and now exhaustion with little physical challenge. 3) Torso and extremities twitching during sleep. abnormal muscle tone.  4) Jerky, twitchy affecting handwriting changes. Does not experience  REM BD.   5) anemia is corrected but MCH elevation.    My  Plan is to proceed with:  1) attended sleep study , [facility]  [facility], preferred. Patient will be Ok with starting with a HST if insurance.  2) abnormal muscle tone is baffling, no parkinsonian features of gait .   3) depression controlled - by Geriatric depression score- 5/ 15 points.   4) low Vit D.    I would like to thank  Brandon Burke, [address],  [place] [address] for allowing me to meet with and to take care of this pleasant patient.   In short, Brandon Burke is presenting with excessive daytime sleepiness.   I plan to follow up either personally or through our NP within 3-4 month.   CC: I will share my notes with PCP   Electronically signed by: Larey Seat, MD 08/05/2020 1:12 PM  [facility]  [facility] Board certified by The [employer] of Sleep Medicine and Fellow of the [employer] of Neurology. Medical Director of [employer].

## 2020-08-05 NOTE — Patient Instructions (Signed)
Hypersomnia Hypersomnia is a condition in which a person feels very tired during the day even though he or she gets plenty of sleep at night. A person with this condition may take naps during the day and may find it very difficult to wake up from sleep. Hypersomnia may affect a person's ability to think, concentrate, drive, or remember things. What are the causes? The cause of this condition may not be known. Possible causes include:  Certain medicines.  Sleep disorders, such as narcolepsy and sleep apnea.  Injury to the head, brain, or spinal cord.  Drug or alcohol use.  Gastroesophageal reflux disease (GERD).  Tumors.  Certain medical conditions, such as depression, diabetes, or an underactive thyroid gland (hypothyroidism). What are the signs or symptoms? The main symptoms of hypersomnia include:  Feeling very tired throughout the day, regardless of how much sleep you got the night before.  Having trouble waking up. Others may find it difficult to wake you up when you are sleeping.  Sleeping for longer and longer periods at a time.  Taking naps throughout the day. Other symptoms may include:  Feeling restless, anxious, or annoyed.  Lacking energy.  Having trouble with: ? Remembering. ? Speaking. ? Thinking.  Loss of appetite.  Seeing, hearing, tasting, smelling, or feeling things that are not real (hallucinations). How is this diagnosed? This condition may be diagnosed based on:  Your symptoms and medical history.  Your sleeping habits. Your health care provider may ask you to write down your sleeping habits in a daily sleep log, along with any symptoms you have.  A series of tests that are done while you sleep (sleep study or polysomnogram).  A test that measures how quickly you can fall asleep during the day (daytime nap study or multiple sleep latency test). How is this treated? Treatment can help you manage your condition. Treatment may  include:  Following a regular sleep routine.  Lifestyle changes, such as changing your eating habits, getting regular exercise, and avoiding alcohol or caffeinated beverages.  Taking medicines to make you more alert (stimulants) during the day.  Treating any underlying medical causes of hypersomnia. Follow these instructions at home: Sleep routine  Schedule the same bedtime and wake-up time each day.  Practice a relaxing bedtime routine. This may include reading, meditation, deep breathing, or taking a warm bath before going to sleep.  Get regular exercise each day. Avoid strenuous exercise in the evening hours.  Keep your sleep environment at a cooler temperature, darkened, and quiet.  Sleep with pillows and a mattress that are comfortable and supportive.  Schedule short 20-minute naps for when you feel sleepiest during the day.  Talk with your employer or teachers about your hypersomnia. If possible, adjust your schedule so that: ? You have a regular daytime work schedule. ? You can take a scheduled nap during the day. ? You do not have to work or be active at night.  Do not eat a heavy meal for a few hours before bedtime. Eat your meals at about the same times every day.  Avoid drinking alcohol or caffeinated beverages.   Safety  Do not drive or use heavy machinery if you are sleepy. Ask your health care provider if it is safe for you to drive.  Wear a life jacket when swimming or spending time near water.   General instructions  Take supplements and over-the-counter and prescription medicines only as told by your health care provider.  Keep a sleep log that   will help your doctor manage your condition. This may include information about: ? What time you go to bed each night. ? How often you wake up at night. ? How many hours you sleep at night. ? How often and for how long you nap during the day. ? Any observations from others, such as leg movements during sleep,  sleep walking, or snoring.  Keep all follow-up visits as told by your health care provider. This is important. Contact a health care provider if:  You have new symptoms.  Your symptoms get worse. Get help right away if:  You have serious thoughts about hurting yourself or someone else. If you ever feel like you may hurt yourself or others, or have thoughts about taking your own life, get help right away. You can go to your nearest emergency department or call:  Your local emergency services (911 in the [place]  A suicide crisis helpline, such as the [place] at 534-792-3435. This is open 24 hours a day. Summary  Hypersomnia refers to a condition in which you feel very tired during the day even though you get plenty of sleep at night.  A person with this condition may take naps during the day and may find it very difficult to wake up from sleep.  Hypersomnia may affect a person's ability to think, concentrate, drive, or remember things.  Treatment, such as following a regular sleep routine and making some lifestyle changes, can help you manage your condition. This information is not intended to replace advice given to you by your health care provider. Make sure you discuss any questions you have with your health care provider. Document Revised: 02/28/2020 Document Reviewed: 02/28/2020 Elsevier Patient Education  2021 [employer]. Chronic Fatigue Syndrome Chronic fatigue syndrome (CFS) is a condition that causes extreme tiredness (fatigue). This condition is also known as myalgic encephalomyelitis (ME). The fatigue in CFS does not improve with rest, and it gets worse with physical or mental activity. Several other symptoms may occur along with fatigue. Symptoms may come and go, but they generally last for months. Sometimes, CFS gets better over time. In other cases, it can be a lifelong condition. There is no cure, but there are many possible treatments. You  will need to work with your health care providers to find a treatment plan that works best for you. What are the causes? The cause of this condition is not known. CFS may be caused by a combination of things. Possible causes include:  An infection.  An abnormal body defense system (abnormal immune system).  Low blood pressure.  Poor diet.  Living with a lot of physical or emotional stress. What increases the risk? The following factors may make you more likely to develop this condition:  Being male.  Being 33-40 years old.  Having a family history of CFS. What are the signs or symptoms? The main symptom of this condition is fatigue that is severe enough to interfere with day-to-day activities. This fatigue does not get better with rest, and it gets worse with physical or mental activity. There are eight other major symptoms of CFS:  Discomfort and lack of energy (malaise) that lasts more than 24 hours after physical activity.  Sleep that does not relieve fatigue (unrefreshing sleep).  Short-term memory loss or confusion.  Joint pain without redness or swelling.  Muscle aches.  Headaches.  Painful and swollen glands (lymph nodes) in the neck or under the arms.  Sore throat. Other symptoms  can include:  Cramps in the abdomen, constipation, or diarrhea (irritable bowel syndrome).  Chills and night sweats.  Vision changes.  Dizziness and mental confusion (brain fog).  Clumsiness.  Sensitivity to food, noise, or odors.  Mood swings, depression, or anxiety attacks. How is this diagnosed? There are no tests that can diagnose this condition. Your health care provider will make the diagnosis based on:  Your symptoms and medical history.  A physical exam and a mental health exam.  Tests to rule out other conditions. It is important to make sure that your symptoms are not caused by another medical condition. Tests may include lab tests or X-rays. For your health  care provider to diagnose CFS:  You must have had fatigue for at least 6 straight months.  Fatigue must be your first symptom, and it must be severe enough to interfere with day-to-day activities.  You must also have at least four of the eight other major symptoms of CFS.  There must be no other cause found for the fatigue. How is this treated? There is no cure for CFS. The condition affects everyone differently. You will need to work with your team of health care providers to find the best treatments for your symptoms. Your team may include your primary care provider, physical and exercise therapists, and mental health therapists. Treatment may include:  Having a regular bedtime routine to help improve your sleep.  Avoiding caffeine, alcohol, and tobacco or nicotine products.  Doing light exercise and stretching during the day. You may also want to try movement exercises, such as yoga or tai chi.  Taking medicines to help you sleep or to relieve joint or muscle pain.  Learning and practicing relaxation techniques, such as deep breathing and muscle relaxation.  Using memory aids or doing brainteasers to improve memory and concentration.  Getting care for your body and mental well-being, such as: ? Seeing a mental health therapist to evaluate and treat depression, if necessary. ? Cognitive behavioral therapy (CBT). This therapy changes the way you think or act in response to the fatigue. This may help improve how you feel. ? Trying massage therapy and acupuncture.   Follow these instructions at home: Eating and drinking  Avoid caffeine and alcohol.  Avoid heavy meals in the evening.  Eat a healthy diet that includes foods such as vegetables, fruits, fish, and lean meats.   Activity  Rest as told by your health care provider.  Avoid fatigue by pacing yourself during the day and getting enough sleep at night.  Exercise regularly, as told by your health care provider.  Go to  bed and get up at the same time every day. Lifestyle  Ask your health care provider whether you should keep a diary. Your health care provider will tell you what information to write in the diary. This may include when you have fatigue and how medicines and other behaviors or treatments help to reduce the fatigue.  Consider joining a CFS support group.  Avoid stress and use stress-reducing techniques that you learn in therapy. General instructions  Take over-the-counter and prescription medicines only as told by your health care provider.  Do not use herbal or dietary supplements unless they are approved by your health care provider.  Maintain a healthy weight.  Do not use any products that contain nicotine or tobacco, such as cigarettes, e-cigarettes, and chewing tobacco. If you need help quitting, ask your health care provider.  Keep all follow-up visits as told by your  health care provider. This is important.   Where to find more information Get more information or find a support group near you at one of these links:  American Myalgic Encephalomyelitis and Chronic Fatigue Syndrome Society: ammes.org  Centers for Disease Control and Prevention: http://www.wolf.info/ Contact a health care provider if:  Your symptoms do not get better or they get worse.  You feel angry, guilty, anxious, or depressed. Get help right away if:  You have thoughts of self-harm. If you ever feel like you may hurt yourself or others, or have thoughts about taking your own life, get help right away. Go to your nearest emergency department or:  Call your local emergency services (911 in the [place]  Call a suicide crisis helpline, such as the [place] at (413)222-6417. This is open 24 hours a day in the [place]  Text the Crisis Text Line at (437)278-3933 (in the [place].). Summary  Chronic fatigue syndrome (CFS) is a condition that causes extreme tiredness (fatigue). This fatigue does not improve  with rest, and it gets worse with physical or mental activity.  There is no cure for CFS. The condition affects everyone differently. You will need to work with your team of health care providers to find the best treatments for your symptoms.  Exercise regularly, as told by your health care provider. Avoid stress and use stress-reducing techniques that you learn in therapy.  Contact a health care provider if your symptoms do not get better or they get worse. This information is not intended to replace advice given to you by your health care provider. Make sure you discuss any questions you have with your health care provider. Document Revised: 04/02/2019 Document Reviewed: 04/02/2019 Elsevier Patient Education  2021 [employer].

## 2020-08-11 ENCOUNTER — Institutional Professional Consult (permissible substitution): Payer: PPO | Admitting: Neurology

## 2020-08-12 ENCOUNTER — Other Ambulatory Visit: Payer: Self-pay | Admitting: Internal Medicine

## 2020-08-28 ENCOUNTER — Encounter: Payer: Self-pay | Admitting: Internal Medicine

## 2020-08-28 ENCOUNTER — Ambulatory Visit (INDEPENDENT_AMBULATORY_CARE_PROVIDER_SITE_OTHER): Payer: PPO | Admitting: Internal Medicine

## 2020-08-28 ENCOUNTER — Other Ambulatory Visit: Payer: Self-pay

## 2020-08-28 VITALS — BP 126/80 | HR 61 | Temp 98.0°F | Resp 16 | Ht 72.0 in | Wt 174.5 lb

## 2020-08-28 DIAGNOSIS — R5383 Other fatigue: Secondary | ICD-10-CM | POA: Diagnosis not present

## 2020-08-28 DIAGNOSIS — E038 Other specified hypothyroidism: Secondary | ICD-10-CM

## 2020-08-28 DIAGNOSIS — G4710 Hypersomnia, unspecified: Secondary | ICD-10-CM | POA: Diagnosis not present

## 2020-08-28 LAB — T4, FREE: Free T4: 0.83 ng/dL (ref 0.60–1.60)

## 2020-08-28 LAB — TSH: TSH: 6.4 u[IU]/mL — ABNORMAL HIGH (ref 0.35–4.50)

## 2020-08-28 NOTE — Patient Instructions (Signed)
  GO TO THE LAB : Get the blood work     GO TO THE FRONT DESK, PLEASE SCHEDULE YOUR APPOINTMENTS Come back for   a physical exam in 3 months 

## 2020-08-28 NOTE — Progress Notes (Signed)
Subjective:    Patient ID: Brandon Burke, male    DOB: [birth year], 87 y.o.   MRN: [id number]  DOS:  08/28/2020 Type of visit - description: Routine checkup  Here to discuss his chronic medical problems. We also talk about fatigue and feeling sleepy, that has not changed. Since the last visit he eventually saw neurology, note reviewed.  Review of Systems See above   Past Medical History:  Diagnosis Date  . Constipation   . DVT (deep venous thrombosis) ([place])    hx of 2001 (after prostate surgery)    . Glaucoma   . Hyperlipidemia   . Hypertension   . Melanoma ([place])    face, hand  . Mitral valve prolapse   . PE (pulmonary embolism)    5-09:was referred to hematology, and they recommend to discontinue Coumadin 5/11  . Prostate cancer [facility])     released from urology in 2010,needs yearly PSAs with PCP  . Seasonal allergies     Past Surgical History:  Procedure Laterality Date  . CATARACT EXTRACTION Bilateral   . COLONOSCOPY     several  . EYE SURGERY Bilateral    Cat Sx  . PARS PLANA VITRECTOMY Left 09/06/2019   Procedure: PARS PLANA VITRECTOMY WITH 25 GAUGE WITH INTRAVITREAL ANTIBIOTICS;  Surgeon: [name], MD;  Location: [facility];  Service: Ophthalmology;  Laterality: Left;  . PHOTOCOAGULATION WITH LASER Left 09/06/2019   Procedure: Photocoagulation With Laser;  Surgeon: [name], MD;  Location: [facility];  Service: Ophthalmology;  Laterality: Left;  . PROSTATECTOMY  2000  . RUPTURED GLOBE EXPLORATION AND REPAIR Left 08/16/2019   Procedure: OPEN GLOBE EXPLORATION EYE POSSIBLE ANTERIOR  CHAMBER [place] OUT;  Surgeon: Lonia Skinner, MD;  Location: [facility];  Service: Ophthalmology;  Laterality: Left;    Allergies as of 08/28/2020      Reactions   Nitrofuran Derivatives Other (See Comments)   Caused headaches   Sulfonamide Derivatives Nausea And Vomiting      Medication List       Accurate as of August 28, 2020 11:59 PM. If you have any questions, ask your nurse or  doctor.        STOP taking these medications   FLUoxetine 20 MG tablet Commonly known as: PROZAC Stopped by: [name], MD     TAKE these medications   acetaminophen 500 MG tablet Commonly known as: TYLENOL Take 1,000 mg by mouth every 6 (six) hours as needed for mild pain or headache.   Align 4 MG Caps Take 4 mg by mouth daily.   apixaban 2.5 MG Tabs tablet Commonly known as: Eliquis Take 1 tablet (2.5 mg total) by mouth 2 (two) times daily.   ARTIFICIAL TEARS OP Place 1 drop into both eyes 3 (three) times daily as needed (for dryness).   atenolol 50 MG tablet Commonly known as: TENORMIN Take 1 tablet (50 mg total) by mouth daily.   azelastine 0.1 % nasal spray Commonly known as: ASTELIN Place 2 sprays into both nostrils at bedtime as needed for rhinitis or allergies.   calcium carbonate 500 MG chewable tablet Commonly known as: TUMS - dosed in mg elemental calcium Chew 1-2 tablets by mouth as needed for indigestion or heartburn.   docusate sodium 100 MG capsule Commonly known as: COLACE Take 100 mg by mouth daily as needed for mild constipation.   dorzolamide-timolol 22.3-6.8 MG/ML ophthalmic solution Commonly known as: COSOPT   fluocinonide 0.05 % external solution Commonly known as: LIDEX  Apply 1 application topically See admin instructions. Apply to the scalp as directed at bedtime   fluticasone 50 MCG/ACT nasal spray Commonly known as: FLONASE Place 2 sprays into both nostrils daily as needed for allergies or rhinitis.   folic acid 160 MCG tablet Commonly known as: FOLVITE Take 400 mcg by mouth daily.   multivitamin,tx-minerals tablet Take 1 tablet by mouth at bedtime.   NON FORMULARY Take 1-2 capsules by mouth See admin instructions. [place] capsules: Take 1 capsule by mouth once a day, alternating with 2 capsules every other day   polyethylene glycol 17 g packet Commonly known as: MIRALAX / GLYCOLAX Take 17 g by mouth daily. MIX AND DRINK    pravastatin 40 MG tablet Commonly known as: PRAVACHOL Take 1 tablet (40 mg total) by mouth daily.   Travoprost (BAK Free) 0.004 % Soln ophthalmic solution Commonly known as: TRAVATAN Place 1 drop into both eyes at bedtime.   Vitamin D3 25 MCG (1000 UT) Caps Take 1,000 Units by mouth daily.          Objective:   Physical Exam BP 126/80 (BP Location: Right Arm, Patient Position: Sitting, Cuff Size: Small)   Pulse 61   Temp 98 F (36.7 C) (Oral)   Resp 16   Ht 6' (1.829 m)   Wt 174 lb 8 oz (79.2 kg)   SpO2 97%   BMI 23.67 kg/m  General:   Well developed, NAD, BMI noted. HEENT:  Normocephalic . Face symmetric, atraumatic Lungs:  CTA B Normal respiratory effort, no intercostal retractions, no accessory muscle use. Heart: RRR,  no murmur.  Lower extremities: no pretibial edema bilaterally  Skin: Not pale. Not jaundice Neurologic:  alert & oriented X3.  Speech normal, gait appropriate for age and unassisted Psych--  Cognition and judgment appear intact.  Cooperative with normal attention span and concentration.  Behavior appropriate. No anxious or depressed appearing.      Assessment     Assessment Hyperlipidemia Prostate cancer, released from urology 2010, Rx yearly PSAs by PCP HEM: DVT 2001 after surgery; PE 2009, eval by hematology, they recommend DC Coumadin 2011.  Spontaneous right leg DVT 11/2019, anticoag x life (dose decreased to Eliquis 2.5 twice daily on 04/2020) H/o MVP-- on BB glaucoma Sees dermatology q 6 months  Facial FX 08-2019, lost L eye vision  PLAN Fatigue, feeling sleepy: Since the last visit, he saw neurology, they found no evidence of Parkinson, did recommend to start vitamin D due to slightly low levels.  A sleep study is pending. In the meantime, patient feels about the same. Labs reviewed, all okay, will repeat a TSH and free T4, subclinical hypothyroidism?Marland Kitchen Hyperlipidemia: Well-controlled on Pravachol. Depression: Self stopped  fluoxetine, feels well emotionally.  Reassess the issue periodically Preventive care: POA on file.  Benefits of a COVID booster #2 discussed.  She will think about it. RTC 3 months CPX    This visit occurred during the SARS-CoV-2 public health emergency.  Safety protocols were in place, including screening questions prior to the visit, additional usage of staff PPE, and extensive cleaning of exam room while observing appropriate contact time as indicated for disinfecting solutions.

## 2020-08-30 NOTE — Assessment & Plan Note (Signed)
Fatigue, feeling sleepy: Since the last visit, he saw neurology, they found no evidence of Parkinson, did recommend to start vitamin D due to slightly low levels.  A sleep study is pending. In the meantime, patient feels about the same. Labs reviewed, all okay, will repeat a TSH and free T4, subclinical hypothyroidism?Marland Kitchen Hyperlipidemia: Well-controlled on Pravachol. Depression: Self stopped fluoxetine, feels well emotionally.  Reassess the issue periodically Preventive care: POA on file.  Benefits of a COVID booster #2 discussed.  She will think about it. RTC 3 months CPX

## 2020-09-01 ENCOUNTER — Other Ambulatory Visit: Payer: Self-pay

## 2020-09-01 ENCOUNTER — Ambulatory Visit (INDEPENDENT_AMBULATORY_CARE_PROVIDER_SITE_OTHER): Payer: PPO | Admitting: Neurology

## 2020-09-01 DIAGNOSIS — H401111 Primary open-angle glaucoma, right eye, mild stage: Secondary | ICD-10-CM | POA: Diagnosis not present

## 2020-09-01 DIAGNOSIS — R5382 Chronic fatigue, unspecified: Secondary | ICD-10-CM

## 2020-09-01 DIAGNOSIS — G4719 Other hypersomnia: Secondary | ICD-10-CM

## 2020-09-01 DIAGNOSIS — G4733 Obstructive sleep apnea (adult) (pediatric): Secondary | ICD-10-CM | POA: Diagnosis not present

## 2020-09-01 DIAGNOSIS — I341 Nonrheumatic mitral (valve) prolapse: Secondary | ICD-10-CM

## 2020-09-01 DIAGNOSIS — Z86711 Personal history of pulmonary embolism: Secondary | ICD-10-CM

## 2020-09-01 DIAGNOSIS — H02421 Myogenic ptosis of right eyelid: Secondary | ICD-10-CM | POA: Diagnosis not present

## 2020-09-01 DIAGNOSIS — S0232XS Fracture of orbital floor, left side, sequela: Secondary | ICD-10-CM

## 2020-09-01 DIAGNOSIS — H16212 Exposure keratoconjunctivitis, left eye: Secondary | ICD-10-CM | POA: Diagnosis not present

## 2020-09-01 DIAGNOSIS — G253 Myoclonus: Secondary | ICD-10-CM

## 2020-09-01 DIAGNOSIS — E0590 Thyrotoxicosis, unspecified without thyrotoxic crisis or storm: Secondary | ICD-10-CM

## 2020-09-01 DIAGNOSIS — H4032X3 Glaucoma secondary to eye trauma, left eye, severe stage: Secondary | ICD-10-CM | POA: Diagnosis not present

## 2020-09-01 MED ORDER — LEVOTHYROXINE SODIUM 25 MCG PO TABS: 25.0000 ug | ORAL_TABLET | Freq: Every day | ORAL | 1 refills | Status: DC

## 2020-09-04 NOTE — Progress Notes (Signed)
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 Cataract,  Constipation, DVT (deep venous thrombosis) leading repeatedly to PE-s [facility]), Glaucoma, Hyperlipidemia, Hypertension, Melanoma ([place]), Mitral valve prolapse,  Multiple PE (pulmonary embolism 200- 2022), Prostate cancer ([place]), and Seasonal allergies. His wife has noted jerking movements in his sleep, but denied witnessing any snoring or apnea   The patient never has had a sleep study.   Epworth sleepiness score: 16/24. FSS at 57/63 points, sever fatigue.   BMI: 23.0 kg/m  Neck Circumference: 15.5"  FINDINGS:   Total Record Time (hours, min): 8 h 22 min  Total Sleep Time (hours, min):  7 h 23 min   Percent REM (%):    24.69 %   Calculated pAHI (per hour): 22.1    REM pAHI: 27.0  NREM pAHI: 20.5 Supine AHI: 21.3   Oxygen Saturation (%) Mean: 94  Minimum oxygen saturation (%):        82   O2 Saturation Range (%): 82-98  O2Saturation (minutes) <=88%: .01 min   Pulse Mean (bpm):    67  Pulse Range (50-107)   IMPRESSION: This HST confirms the diagnosis of moderate- severe OSA (obstructive sleep apnea) and snoring, without associated clinically significant  Hypoxemia. This OSA is not a REM sleep dependent form of Apnea.    RECOMMENDATION: The patient can choose between dental device, inspire and CPAP. I will initiate CPAP autotitration if the patient agrees, at 5-15 cm water 2 cm EPR and heated humidification and his choice of interface/ mask.     INTERPRETING PHYSICIAN:  Larey Seat, MD    [facility] and [facility] Sleep Board certified by The [employer] of  Sleep Medicine and Diplomate of the [employer] of Sleep Medicine. Board certified In Neurology through the [place], Fellow of the [employer] of Neurology. Medical Director of [employer].    Sleep Summary  Oxygen Saturation Statistics   Start Study Time: End Study Time: Total Recording Time:      10:31:20 PM 6:53:22 AM 8 h, 22 min  Total Sleep Time % REM of Sleep Time:  7 h, 23 min  24.7    Mean: 94 Minimum: 82 Maximum: 98  Mean of Desaturations Nadirs (%):   92  Oxygen Desatur. %: 4-9 10-20 >20 Total  Events Number Total  44 100.0  0 0.0  0 0.0  44 100.0  Oxygen Saturation: <90 <=88 <85 <80 <70  Duration (minutes): Sleep % 0.1 0.1 0.0 0.0 0.0 0.0 0.0 0.0 0.0 0.0     Respiratory Indices      Total Events REM NREM All Night  pRDI: pAHI 3%: ODI 4%: pAHIc 3%: % CSR: pAHI 4%:  219 161   44  39 0.0 76 29.8 27.0 11.0 9.4 30.2 20.5 4.4 4.3 30.1 22.1 6.0 5.6  10.4       Pulse Rate Statistics during Sleep (BPM)      Mean: 67 Minimum: 50 Maximum: 107        Body Position Statistics  Position Supine Prone Right Left Non-Supine  Sleep (min) 243.5 0.0 200.0 0.0 200.0  Sleep % 54.9 0.0 45.1 0.0 45.1  pRDI 38.7  N/A 19.9 N/A 19.9  pAHI 3% 31.3 N/A 11.1 N/A 11.1  ODI 4% 11.1 N/A 0.0 N/A 0.0     Snoring Statistics Snoring Level (dB) >40 >50 >60 >70 >80 >Threshold (45)  Sleep (min) 106.8 1.7 0.3 0.0 0.0 5.2  Sleep % 24.1 0.4 0.1 0.0 0.0 1.2    Mean: 40 dB

## 2020-09-16 DIAGNOSIS — L570 Actinic keratosis: Secondary | ICD-10-CM | POA: Diagnosis not present

## 2020-09-16 DIAGNOSIS — Z85828 Personal history of other malignant neoplasm of skin: Secondary | ICD-10-CM | POA: Diagnosis not present

## 2020-09-17 ENCOUNTER — Telehealth: Payer: Self-pay | Admitting: Neurology

## 2020-09-17 NOTE — Telephone Encounter (Signed)
Patient completed sleep study on 09/01/20. Dr Brett Fairy has been on vacation for 2 weeks and having to play catch up from being away. As soon as the results come through I will let the patient know.

## 2020-09-17 NOTE — Telephone Encounter (Signed)
I will have the results on Thursday.

## 2020-09-17 NOTE — Telephone Encounter (Signed)
Pt is asking for a call from RN with results to his sleep study

## 2020-09-23 ENCOUNTER — Other Ambulatory Visit: Payer: Self-pay | Admitting: Internal Medicine

## 2020-09-23 NOTE — Progress Notes (Signed)
IMPRESSION: This HST confirms the diagnosis of moderate- severe OSA (obstructive sleep apnea) and snoring, without associated clinically significant  Hypoxemia. This OSA is not a REM sleep dependent form of Apnea.    RECOMMENDATION: The patient can choose between dental device, inspire and CPAP. I will initiate CPAP autotitration if the patient agrees, at 5-15 cm water 2 cm EPR and heated humidification and his choice of interface/ mask.

## 2020-09-23 NOTE — Addendum Note (Signed)
Addended by: Larey Seat on: 09/23/2020 06:25 PM   Modules accepted: Orders

## 2020-09-23 NOTE — Procedures (Signed)
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 Cataract,  Constipation, DVT (deep venous thrombosis) leading repeatedly to PE-s [facility]), Glaucoma, Hyperlipidemia, Hypertension, Melanoma ([place]), Mitral valve prolapse,  Multiple PE (pulmonary embolism 200- 2022), Prostate cancer ([place]), and Seasonal allergies. His wife has noted jerking movements in his sleep, but denied witnessing any snoring or apnea   The patient never has had a sleep study.   Epworth sleepiness score: 16/24. FSS at 57/63 points, sever fatigue.   BMI: 23.0 kg/m  Neck Circumference: 15.5"  FINDINGS:   Total Record Time (hours, min): 8 h 22 min  Total Sleep Time (hours, min):  7 h 23 min   Percent REM (%):    24.69 %   Calculated pAHI (per hour): 22.1    REM pAHI: 27.0  NREM pAHI: 20.5 Supine AHI: 21.3   Oxygen Saturation (%) Mean: 94  Minimum oxygen saturation (%):        82   O2 Saturation Range (%): 82-98  O2Saturation (minutes) <=88%: .01 min   Pulse Mean (bpm):    67  Pulse Range (50-107)   IMPRESSION: This HST confirms the diagnosis of moderate- severe OSA (obstructive sleep apnea) and snoring, without associated clinically significant  Hypoxemia. This OSA is not a REM sleep dependent form of Apnea.    RECOMMENDATION: The patient can choose between dental device, inspire and CPAP. I will initiate CPAP autotitration if the patient agrees, at 5-15 cm water 2 cm EPR and heated humidification and his choice of interface/ mask.     INTERPRETING PHYSICIAN:  Larey Seat, MD    [facility] Sleep Board certified by The [employer] of Sleep  Medicine and Diplomate of the [employer] of Sleep Medicine. Board certified In Neurology through the [place], Fellow of the [employer] of Neurology. Medical Director of [employer].    Sleep Summary  Oxygen Saturation Statistics   Start Study Time: End Study Time: Total Recording Time:      10:31:20 PM 6:53:22 AM 8 h, 22 min  Total Sleep Time % REM of Sleep Time:  7 h, 23 min  24.7    Mean: 94 Minimum: 82 Maximum: 98  Mean of Desaturations Nadirs (%):   92  Oxygen Desatur. %: 4-9 10-20 >20 Total  Events Number Total  44 100.0  0 0.0  0 0.0  44 100.0  Oxygen Saturation: <90 <=88 <85 <80 <70  Duration (minutes): Sleep % 0.1 0.1 0.0 0.0 0.0 0.0 0.0 0.0 0.0 0.0     Respiratory Indices      Total Events REM NREM All Night  pRDI: pAHI 3%: ODI 4%: pAHIc 3%: % CSR: pAHI 4%:  219 161   44  39 0.0 76 29.8 27.0 11.0 9.4 30.2 20.5 4.4 4.3 30.1 22.1 6.0 5.6  10.4       Pulse Rate Statistics during Sleep (BPM)      Mean: 67 Minimum: 50 Maximum: 107        Body Position Statistics  Position Supine Prone Right Left Non-Supine  Sleep (min) 243.5 0.0 200.0 0.0 200.0  Sleep % 54.9 0.0 45.1 0.0 45.1  pRDI 38.7 N/A  19.9 N/A 19.9  pAHI 3% 31.3 N/A 11.1 N/A 11.1  ODI 4% 11.1 N/A 0.0 N/A 0.0     Snoring Statistics Snoring Level (dB) >40 >50 >60 >70 >80 >Threshold (45)  Sleep (min) 106.8 1.7 0.3 0.0 0.0 5.2  Sleep % 24.1 0.4 0.1 0.0 0.0 1.2

## 2020-09-25 ENCOUNTER — Telehealth: Payer: Self-pay | Admitting: Neurology

## 2020-09-25 ENCOUNTER — Encounter: Payer: Self-pay | Admitting: Neurology

## 2020-09-25 NOTE — Telephone Encounter (Signed)
I called pt. I advised pt that Dr. Brett Fairy reviewed their sleep study results and found that pt moderate to severe OSA. Dr. Brett Fairy recommends that pt starts auto CPAP. I reviewed PAP compliance expectations with the pt. Pt is agreeable to starting a CPAP. I advised pt that an order will be sent to a DME, [facility] ([facility] and [facility] ([place]) will call the pt within about one week after they file with the pt's insurance. [facility]) will show the pt how to use the machine, fit for masks, and troubleshoot the CPAP if needed. A follow up appt was made for insurance purposes with Ward Givens, NP on 01/22/21 at 10:30 am. Pt verbalized understanding to arrive 15 minutes early and bring their CPAP. A letter with all of this information in it will be mailed to the pt as a reminder. I verified with the pt that the address we have on file is correct. Pt verbalized understanding of results. Pt had no questions at this time but was encouraged to call back if questions arise. I have sent the order to [facility]) and have received confirmation that they have received the order.

## 2020-09-25 NOTE — Telephone Encounter (Signed)
-----   Message from Larey Seat, MD sent at 09/23/2020  6:25 PM EDT ----- IMPRESSION: This HST confirms the diagnosis of moderate- severe OSA (obstructive sleep apnea) and snoring, without associated clinically significant  Hypoxemia. This OSA is not a REM sleep dependent form of Apnea.    RECOMMENDATION: The patient can choose between dental device, inspire and CPAP. I will initiate CPAP autotitration if the patient agrees, at 5-15 cm water 2 cm EPR and heated humidification and his choice of interface/ mask.

## 2020-09-26 ENCOUNTER — Telehealth: Payer: Self-pay | Admitting: Internal Medicine

## 2020-09-26 MED ORDER — LEVOTHYROXINE SODIUM 25 MCG PO TABS: 25.0000 ug | ORAL_TABLET | Freq: Two times a day (BID) | ORAL | 0 refills | Status: DC

## 2020-09-26 NOTE — Telephone Encounter (Signed)
Rx sent to Mail order for 90 days to take BID. -JMA

## 2020-09-26 NOTE — Telephone Encounter (Signed)
Patient states he is tolerating the two pills well.  Patient would like a 90 days supply.   New pharmacy   levothyroxine (SYNTHROID) Patient is now taking 2 25 mcg daily per last instructions             [facility] ([place], [place] Phone:  [phone]  Fax:  [phone]

## 2020-10-01 ENCOUNTER — Telehealth: Payer: Self-pay | Admitting: Internal Medicine

## 2020-10-01 NOTE — Telephone Encounter (Signed)
My instructions from 08/28/2020 were: Take levothyroxine 25 mcg 1 tablet a day for the first 3 weeks and then increase to 2 tablets a day (they can be taken together). Planning to recheck his thyroid when he comes back for an appointment 10/13/2020.

## 2020-10-01 NOTE — Telephone Encounter (Signed)
Pt, wife call and states pt, take one Levothyroxine before breakfast and want to know if he can take both at the same time  or should take it at a later time wife would like a call back  Please advice

## 2020-10-01 NOTE — Telephone Encounter (Signed)
Called both numbers provided and left a Vm on the home phone on original message to return call to office. -JMA

## 2020-10-02 ENCOUNTER — Other Ambulatory Visit: Payer: Self-pay

## 2020-10-02 MED ORDER — LEVOTHYROXINE SODIUM 25 MCG PO TABS: 25.0000 ug | ORAL_TABLET | Freq: Two times a day (BID) | ORAL | 0 refills | Status: DC

## 2020-10-02 NOTE — Telephone Encounter (Signed)
Spoke w/ [name]- informed of recommendations. [name] verbalized understanding.

## 2020-10-09 DIAGNOSIS — G4733 Obstructive sleep apnea (adult) (pediatric): Secondary | ICD-10-CM | POA: Diagnosis not present

## 2020-10-10 DIAGNOSIS — M79671 Pain in right foot: Secondary | ICD-10-CM | POA: Diagnosis not present

## 2020-10-10 DIAGNOSIS — L602 Onychogryphosis: Secondary | ICD-10-CM | POA: Diagnosis not present

## 2020-10-10 DIAGNOSIS — M79672 Pain in left foot: Secondary | ICD-10-CM | POA: Diagnosis not present

## 2020-10-13 ENCOUNTER — Other Ambulatory Visit: Payer: Self-pay

## 2020-10-13 ENCOUNTER — Other Ambulatory Visit: Payer: Self-pay | Admitting: Internal Medicine

## 2020-10-13 ENCOUNTER — Other Ambulatory Visit (INDEPENDENT_AMBULATORY_CARE_PROVIDER_SITE_OTHER): Payer: PPO

## 2020-10-13 DIAGNOSIS — E0590 Thyrotoxicosis, unspecified without thyrotoxic crisis or storm: Secondary | ICD-10-CM

## 2020-10-13 LAB — TSH: TSH: 2.63 u[IU]/mL (ref 0.35–4.50)

## 2020-10-13 MED ORDER — LEVOTHYROXINE SODIUM 25 MCG PO TABS: 25.0000 ug | ORAL_TABLET | Freq: Two times a day (BID) | ORAL | 1 refills | Status: DC

## 2020-10-13 NOTE — Addendum Note (Signed)
Addended byDamita Dunnings D on: 10/13/2020 01:51 PM   Modules accepted: Orders

## 2020-10-17 ENCOUNTER — Telehealth: Payer: Self-pay | Admitting: Internal Medicine

## 2020-10-17 MED ORDER — AZELASTINE HCL 0.1 % NA SOLN: 2.0000 | Freq: Two times a day (BID) | NASAL | 6 refills | Status: DC | PRN

## 2020-10-17 NOTE — Telephone Encounter (Signed)
Spoke w/ Pt- informed of recommendations. Astelin sent to [facility].

## 2020-10-17 NOTE — Telephone Encounter (Signed)
Recommend - Flonase 2 sprays on each side of the nose daily - Astelin 2 sprays on each side of the nose twice daily, send Rx if needed - She can take Claritin 5 to 10 mg daily as well

## 2020-10-17 NOTE — Telephone Encounter (Signed)
Please advise 

## 2020-10-17 NOTE — Telephone Encounter (Signed)
Patient states that he started a z pack this week. But he is still having consistent nasal drainage and wanted to know if he can be recommended anything over the counter to help with the runny nose. Please advise

## 2020-10-27 DIAGNOSIS — Z85828 Personal history of other malignant neoplasm of skin: Secondary | ICD-10-CM | POA: Diagnosis not present

## 2020-10-27 DIAGNOSIS — L218 Other seborrheic dermatitis: Secondary | ICD-10-CM | POA: Diagnosis not present

## 2020-10-27 DIAGNOSIS — L570 Actinic keratosis: Secondary | ICD-10-CM | POA: Diagnosis not present

## 2020-11-08 DIAGNOSIS — G4733 Obstructive sleep apnea (adult) (pediatric): Secondary | ICD-10-CM | POA: Diagnosis not present

## 2020-11-21 ENCOUNTER — Telehealth: Payer: Self-pay

## 2020-11-21 NOTE — Telephone Encounter (Signed)
Pt called first thing this morning stating he felt as if he had "fluid in his throat" for the past few nights while sleeping with his C-pap machine.  He was very concerned last night as he felt he was going to "choke to death."  He stated he is unsure if he needs to see his PCP or pulmonologist.  Pt routed to Nurse Triage for further evaluation.

## 2020-11-21 NOTE — Telephone Encounter (Signed)
Will wait for triage note.

## 2020-11-21 NOTE — Telephone Encounter (Signed)
nurse Assessment Nurse: Gildardo Pounds, RN, Amy Date/Time Eilene Ghazi Time): 11/21/2020 8:11:34 AM Confirm and document reason for call. If symptomatic, describe symptoms. ---Caller states he wears a C-pap machine at night. He has been trying to adjust to the C-pap, but the last few nights he gets accumulation of fluid in his throat from his sinuses. He almost choked to death before he could get the C-pap off last night. He is not going wear it until he gets direction from the doctor. He has to blow his nose many times a day. He has had the runny nose for a while. He is on Claritin. No fever or other symptoms. Does the patient have any new or worsening symptoms? ---Yes Will a triage be completed? ---Yes Related visit to physician within the last 2 weeks? ---No Does the PT have any chronic conditions? (i.e. diabetes, asthma, this includes High risk factors for pregnancy, etc.) ---No Is this a behavioral health or substance abuse call? ---No Guidelines Guideline Title Affirmed Question Affirmed Notes Nurse Date/Time Eilene Ghazi Time) Sinus Pain or Congestion [1] Sinus congestion (pressure, fullness) Lovelace, RN, Amy 11/21/2020 8:13:00 AM PLEASE NOTE: All timestamps contained within this report are represented as [place] Standard Time. CONFIDENTIALTY NOTICE: This fax transmission is intended only for the addressee. It contains information that is legally privileged, confidential or otherwise protected from use or disclosure. If you are not the intended recipient, you are strictly prohibited from reviewing, disclosing, copying using or disseminating any of this information or taking any action in reliance on or regarding this information. If you have received this fax in error, please notify us immediately by telephone so that we can arrange for its return to [place]. Phone: [phone], Toll-Free: [phone], Fax: [phone] Page: 2 of 2 Call Id: [id number] Guidelines Guideline Title Affirmed  Question Affirmed Notes Nurse Date/Time Eilene Ghazi Time) AND [2] present > 10 days Disp. Time Eilene Ghazi Time) Disposition Final User 11/21/2020 8:10:09 AM Send to Urgent Ezra Sites 11/21/2020 8:18:48 AM SEE PCP WITHIN 3 DAYS Yes Lovelace, RN, Amy Caller Disagree/Comply Comply Caller Understands Yes PreDisposition InappropriateToAsk Care Advice Given Per Guideline * Oxymetazoline Nasal Drops (Afrin in [place]; Drixoral in [place]): Available over-the-counter. Clean out the nose before using. Spray each nostril once, wait one minute for absorption, and then spray a second time. * Pseudoephedrine (Sudafed): Available overthe-counter in pill form. Typical adult dosage is two 30 mg tablets every 6 hours. * Phenylephrine Nasal Drops (Neo-Synephrine): Available over-the-counter. Clean out the nose before using. Spray each nostril once, wait one minute for absorption, and then spray a second time. CALL BACK IF: * Difficulty breathing (and not relieved by cleaning out nose) * You become worse CARE ADVICE given per Sinus Pain or Congestion (Adult) guideline. SEE PCP WITHIN 3 DAYS: * You need to be seen within 2 or 3 days. * PCP VISIT: Call your doctor (or NP/PA) during regular office hours and make an appointment. A clinic or urgent care center are good places to go for care if your doctor's office is closed or you can't get an appointment. NOTE: If office will be open tomorrow, tell caller to call then, not in 3 days. NASAL WASHES FOR A STUFFY NOSE: * Introduction: Saline (salt water) nasal irrigation (nasal wash) is an effective and simple home remedy for treating stuffy nose and sinus congestion. The nose can be irrigated by pouring, spraying, or squirting salt water into the nose and then letting it run back out. * How it Helps: The salt water rinses  out excess mucus and washes out any irritants (dust, allergens) that might be present. It also moistens the nasal cavity. * Methods: There are several  ways to irrigate the nose. You can use a saline nasal spray bottle (available over-the-counter), a rubber ear syringe, a medical syringe without the needle, or a NETI POT. NASAL DECONGESTANTS FOR A VERY STUFFY NOSE: * If your nose feels blocked, you should try using nasal washes first. * If you have a very stuffy nose, nasal decongestant medicines can shrink the swollen nasal mucosa and allow for easier breathing. If you have a very runny nose, these medicines can reduce the amount of drainage. They may be taken as pills by mouth or as a nasal spray. NASAL DECONGESTANTS - EXTRA NOTES AND WARNINGS: * Do not use these medicines if you have high blood pressure, heart disease, prostate problems, or an overactive thyroid. Referrals REFERRED TO PCP OFFICE   Appt scheduled w/ PCP 11/25/2020 at 3:20pm

## 2020-11-24 ENCOUNTER — Telehealth: Payer: Self-pay | Admitting: Neurology

## 2020-11-24 NOTE — Telephone Encounter (Signed)
Called the pt back. Pt started the [employer] machine on 10/09/2020. He has been having difficulty with using the machine and wearing it much longer than 3 hrs. Or so. He finally got the mask adjusted to where it fit appropriately with no leaks and he woke up in the middle of night multiple times in a row with mouth full of mucous/flem. He had to go to bathroom to clear it out. He has not worn the machine since fri night because that feeling scared him so bad.  He has a apt with pcp tomorrow to make sure no sinus infection. He is wearing the nasal mask and uses astelin nasal spray at bedtime and flonase in the morning.  Advised I would also make Dr Dohmeier aware of this happening to get her thoughts on this.

## 2020-11-24 NOTE — Telephone Encounter (Signed)
Pt is asking for a call from Casey,RN re: multiple questions he has about his CPAP

## 2020-11-24 NOTE — Telephone Encounter (Signed)
Called the patient back and spoke with him advising that Dr Brett Fairy didn't feel there was concern being caused by CPAP. She agreed with pt holding off using the machine until he follows up with PCP to ensure there is no sinus infection. Once cleared by PCP advised the patient to try using the machine again. Pt verbalized understanding. Pt had no questions at this time but was encouraged to call back if questions arise. He was appreciative for the call back and guidance.

## 2020-11-25 ENCOUNTER — Encounter: Payer: Self-pay | Admitting: Internal Medicine

## 2020-11-25 ENCOUNTER — Other Ambulatory Visit: Payer: Self-pay

## 2020-11-25 ENCOUNTER — Ambulatory Visit (INDEPENDENT_AMBULATORY_CARE_PROVIDER_SITE_OTHER): Payer: PPO | Admitting: Internal Medicine

## 2020-11-25 VITALS — BP 142/78 | HR 81 | Temp 97.5°F | Resp 16 | Ht 72.0 in | Wt 175.2 lb

## 2020-11-25 DIAGNOSIS — E039 Hypothyroidism, unspecified: Secondary | ICD-10-CM | POA: Diagnosis not present

## 2020-11-25 DIAGNOSIS — G4733 Obstructive sleep apnea (adult) (pediatric): Secondary | ICD-10-CM

## 2020-11-25 DIAGNOSIS — J069 Acute upper respiratory infection, unspecified: Secondary | ICD-10-CM | POA: Diagnosis not present

## 2020-11-25 MED ORDER — AZELASTINE HCL 0.15 % NA SOLN: 2.0000 | Freq: Two times a day (BID) | NASAL | 6 refills | Status: DC

## 2020-11-25 NOTE — Progress Notes (Signed)
Subjective:    Patient ID: Brandon Burke, male    DOB: 07-24-1931, 87 y.o.   MRN: [id number]  DOS:  11/25/2020 Type of visit - description: Acute visit, here with his wife.  The patient was diagnosed with sleep apnea and prescribed a CPAP, he started to use few weeks ago, was very uncomfortable and was not able to sleep well   At some point he felt a lot of fluid accumulated in his throat and he almost choked, after 2 nights of those sxs he quit the Cpap last week  On further questions, he developed sinus congestion, cough and clear nasal discharge 2 weeks ago. The discharge seems to be from the sinuses not from deep in the chest.  Also since the last time he was here, he was started on Synthroid and he felt great.  Review of Systems No fever chills No sore throat No chest pain no difficulty breathing No nausea or vomiting No wheezing. Despite the respiratory symptoms he does not feel that he had a URI or viral syndrome.  Past Medical History:  Diagnosis Date   Constipation    DVT (deep venous thrombosis) (HCC)    hx of 2001 (after prostate surgery)     Glaucoma    Hyperlipidemia    Hypertension    Melanoma ([place])    face, hand   Mitral valve prolapse    PE (pulmonary embolism)    5-09:was referred to hematology, and they recommend to discontinue Coumadin 5/11   Prostate cancer ([place])     released from urology in 2010,needs yearly PSAs with PCP   Seasonal allergies     Past Surgical History:  Procedure Laterality Date   CATARACT EXTRACTION Bilateral    COLONOSCOPY     several   EYE SURGERY Bilateral    Cat Sx   PARS PLANA VITRECTOMY Left 09/06/2019   Procedure: PARS PLANA VITRECTOMY WITH 25 GAUGE WITH INTRAVITREAL ANTIBIOTICS;  Surgeon: [name], MD;  Location: [place];  Service: Ophthalmology;  Laterality: Left;   PHOTOCOAGULATION WITH LASER Left 09/06/2019   Procedure: Photocoagulation With Laser;  Surgeon: [name], MD;  Location: [place];  Service:  Ophthalmology;  Laterality: Left;   PROSTATECTOMY  2000   RUPTURED GLOBE EXPLORATION AND REPAIR Left 08/16/2019   Procedure: OPEN GLOBE EXPLORATION EYE POSSIBLE ANTERIOR  CHAMBER [place] OUT;  Surgeon: Lonia Skinner, MD;  Location: [place];  Service: Ophthalmology;  Laterality: Left;    Allergies as of 11/25/2020       Reactions   Nitrofuran Derivatives Other (See Comments)   Caused headaches   Sulfonamide Derivatives Nausea And Vomiting        Medication List        Accurate as of November 25, 2020  3:19 PM. If you have any questions, ask your nurse or doctor.          acetaminophen 500 MG tablet Commonly known as: TYLENOL Take 1,000 mg by mouth every 6 (six) hours as needed for mild pain or headache.   Align 4 MG Caps Take 4 mg by mouth daily.   apixaban 2.5 MG Tabs tablet Commonly known as: Eliquis Take 1 tablet (2.5 mg total) by mouth 2 (two) times daily.   ARTIFICIAL TEARS OP Place 1 drop into both eyes 3 (three) times daily as needed (for dryness).   atenolol 50 MG tablet Commonly known as: TENORMIN Take 1 tablet (50 mg total) by mouth daily.   azelastine 0.1 % nasal  spray Commonly known as: ASTELIN Place 2 sprays into both nostrils 2 (two) times daily as needed for rhinitis or allergies.   calcium carbonate 500 MG chewable tablet Commonly known as: TUMS - dosed in mg elemental calcium Chew 1-2 tablets by mouth as needed for indigestion or heartburn.   docusate sodium 100 MG capsule Commonly known as: COLACE Take 100 mg by mouth daily as needed for mild constipation.   dorzolamide-timolol 22.3-6.8 MG/ML ophthalmic solution Commonly known as: COSOPT   fluocinonide 0.05 % external solution Commonly known as: LIDEX Apply 1 application topically See admin instructions. Apply to the scalp as directed at bedtime   fluticasone 50 MCG/ACT nasal spray Commonly known as: FLONASE Place 2 sprays into both nostrils daily as needed for allergies or rhinitis.   folic  acid A999333 MCG tablet Commonly known as: FOLVITE Take 400 mcg by mouth daily.   levothyroxine 25 MCG tablet Commonly known as: SYNTHROID Take 1 tablet (25 mcg total) by mouth in the morning and at bedtime.   multivitamin,tx-minerals tablet Take 1 tablet by mouth at bedtime.   NON FORMULARY Take 1-2 capsules by mouth See admin instructions. [place] capsules: Take 1 capsule by mouth once a day, alternating with 2 capsules every other day   polyethylene glycol 17 g packet Commonly known as: MIRALAX / GLYCOLAX Take 17 g by mouth daily. MIX AND DRINK   pravastatin 40 MG tablet Commonly known as: PRAVACHOL Take 1 tablet (40 mg total) by mouth daily.   Travoprost (BAK Free) 0.004 % Soln ophthalmic solution Commonly known as: TRAVATAN Place 1 drop into both eyes at bedtime.   Vitamin D3 25 MCG (1000 UT) Caps Take 1,000 Units by mouth daily.           Objective:   Physical Exam BP (!) 142/78 (BP Location: Left Arm, Patient Position: Sitting, Cuff Size: Small)   Pulse 81   Temp (!) 97.5 F (36.4 C) (Oral)   Resp 16   Ht 6' (1.829 m)   Wt 175 lb 4 oz (79.5 kg)   SpO2 96%   BMI 23.77 kg/m  General:   Well developed, NAD, BMI noted. HEENT:  Normocephalic . Face asymmetric. Nose is slightly congested, sinuses non-TTP. Throat: Symmetric, no red.  Next Lungs:  CTA B Normal respiratory effort, no intercostal retractions, no accessory muscle use. Heart: RRR,  no murmur.  Lower extremities: no pretibial edema bilaterally  Skin: Not pale. Not jaundice Neurologic:  alert & oriented X3.  Speech normal, gait appropriate for age and unassisted Psych--  Cognition and judgment appear intact.  Cooperative with normal attention span and concentration.  Behavior appropriate. No anxious or depressed appearing.      Assessment     Assessment Hyperlipidemia Prostate cancer, released from urology 2010, Rx yearly PSAs by PCP HEM: DVT 2001 after surgery; PE 2009, eval by  hematology, they recommend DC Coumadin 2011.  Spontaneous right leg DVT 11/2019, anticoag x life (dose decreased to Eliquis 2.5 twice daily on 04/2020) H/o MVP-- on BB glaucoma Sees dermatology q 6 months  Facial FX 08-2019, lost L eye vision Hypothyroidism, diagnosed 08-2020. OSA Dx 09/01/2020.  PLAN Fatigue, feeling sleepy: See last visit, he was feeling quite fatigued and sleepy. TSH was 6.4.  Based on that result I started a low-dose of levothyroxine thinking that he probably had symptoms of hypothyroidism.  After starting levothyroxine he felt great.  Check a TSH Also, HST showed moderate to severe OSA and snoring, Rx a CPAP,  he uses it with relatively poor tolerance, a week ago, he is stopped the CPAP completely because building up secretion on his respiratory tract. I believe he is having a intercurrent URI or allergies that is making his CPAP use more difficult.  Plan to treat the URI and then retry CPAP. OSA: See above Hypothyroidism: See above, on levothyroxine 25 mcg twice daily, check a TSH, adjust doses if necessary. URI/allergies: Flonase, Patanol nasal spray, Mucinex DM.  Retry CPAP in a couple weeks     This visit occurred during the SARS-CoV-2 public health emergency.  Safety protocols were in place, including screening questions prior to the visit, additional usage of staff PPE, and extensive cleaning of exam room while observing appropriate contact time as indicated for disinfecting solutions.

## 2020-11-25 NOTE — Patient Instructions (Addendum)
I believe your symptoms are due to allergies or a cold: Flonase 2 sprays on each side of the nose daily Astelin 2 sprays on each side of the nose twice daily Mucinex DM Claritin OTC if needed  Once you feel better tried CPAP again   GO TO THE LAB : Get the blood work     GO TO THE FRONT DESK, PLEASE SCHEDULE YOUR APPOINTMENTS Come back for in 2-3  months, okay to reschedule the appointment you have in August

## 2020-11-26 ENCOUNTER — Encounter: Payer: Self-pay | Admitting: Internal Medicine

## 2020-11-26 DIAGNOSIS — G4733 Obstructive sleep apnea (adult) (pediatric): Secondary | ICD-10-CM | POA: Insufficient documentation

## 2020-11-26 HISTORY — DX: Obstructive sleep apnea (adult) (pediatric): G47.33

## 2020-11-26 LAB — TSH: TSH: 2.58 u[IU]/mL (ref 0.35–5.50)

## 2020-11-26 NOTE — Assessment & Plan Note (Signed)
Fatigue, feeling sleepy: See last visit, he was feeling quite fatigued and sleepy. TSH was 6.4.  Based on that result I started a low-dose of levothyroxine thinking that he probably had symptoms of hypothyroidism.  After starting levothyroxine he felt great.  Check a TSH Also, HST showed moderate to severe OSA and snoring, Rx a CPAP, he uses it with relatively poor tolerance, a week ago, he is stopped the CPAP completely because building up secretion on his respiratory tract. I believe he is having a intercurrent URI or allergies that is making his CPAP use more difficult.  Plan to treat the URI and then retry CPAP. OSA: See above Hypothyroidism: See above, on levothyroxine 25 mcg twice daily, check a TSH, adjust doses if necessary. URI/allergies: Flonase, Patanol nasal spray, Mucinex DM.  Retry CPAP in a couple weeks

## 2020-11-27 NOTE — Telephone Encounter (Signed)
Noted  

## 2020-11-27 NOTE — Telephone Encounter (Signed)
Pt has called back to inform Casey,RN that the Dr he saw about his nose says within 3-4 days he should be able to use his CPAP again.

## 2020-12-03 ENCOUNTER — Ambulatory Visit: Payer: PPO | Admitting: Internal Medicine

## 2020-12-03 DIAGNOSIS — H401111 Primary open-angle glaucoma, right eye, mild stage: Secondary | ICD-10-CM | POA: Diagnosis not present

## 2020-12-03 DIAGNOSIS — H4032X3 Glaucoma secondary to eye trauma, left eye, severe stage: Secondary | ICD-10-CM | POA: Diagnosis not present

## 2020-12-03 DIAGNOSIS — H02423 Myogenic ptosis of bilateral eyelids: Secondary | ICD-10-CM | POA: Diagnosis not present

## 2020-12-03 DIAGNOSIS — H16212 Exposure keratoconjunctivitis, left eye: Secondary | ICD-10-CM | POA: Diagnosis not present

## 2020-12-09 DIAGNOSIS — G4733 Obstructive sleep apnea (adult) (pediatric): Secondary | ICD-10-CM | POA: Diagnosis not present

## 2020-12-11 DIAGNOSIS — Z85828 Personal history of other malignant neoplasm of skin: Secondary | ICD-10-CM | POA: Diagnosis not present

## 2020-12-11 DIAGNOSIS — L821 Other seborrheic keratosis: Secondary | ICD-10-CM | POA: Diagnosis not present

## 2020-12-11 DIAGNOSIS — L570 Actinic keratosis: Secondary | ICD-10-CM | POA: Diagnosis not present

## 2020-12-11 DIAGNOSIS — D1801 Hemangioma of skin and subcutaneous tissue: Secondary | ICD-10-CM | POA: Diagnosis not present

## 2020-12-22 ENCOUNTER — Other Ambulatory Visit: Payer: Self-pay

## 2020-12-22 MED ORDER — PRAVASTATIN SODIUM 40 MG PO TABS
40.0000 mg | ORAL_TABLET | Freq: Every day | ORAL | 1 refills | Status: DC
Start: 2020-12-22 — End: 2021-02-28

## 2020-12-23 ENCOUNTER — Encounter: Payer: Self-pay | Admitting: Neurology

## 2020-12-23 ENCOUNTER — Ambulatory Visit: Payer: PPO | Admitting: Neurology

## 2020-12-23 VITALS — BP 128/66 | HR 64 | Ht 72.0 in | Wt 176.0 lb

## 2020-12-23 DIAGNOSIS — G4719 Other hypersomnia: Secondary | ICD-10-CM

## 2020-12-23 DIAGNOSIS — G253 Myoclonus: Secondary | ICD-10-CM | POA: Diagnosis not present

## 2020-12-23 DIAGNOSIS — R5382 Chronic fatigue, unspecified: Secondary | ICD-10-CM | POA: Diagnosis not present

## 2020-12-23 NOTE — Progress Notes (Signed)
SLEEP MEDICINE CLINIC    Provider:  Larey Seat, MD  Primary Care Physician:  Colon Branch, [address] [place] [address]     Referring Provider: Colon Branch, [address],  [place] [id number]          Chief Complaint according to patient   Patient presents with:     New Patient (Initial Visit)           HISTORY OF PRESENT ILLNESS:  Brandon Burke is a 87- year- old Caucasian male patient and seen in a RV  on 12/23/2020 after a home sleep study.  The home sleep test was performed on 04 Sep 2020 and Brandon Burke had endorsed the Epworth sleepiness score at 16 points fatigue severity at 57 points recorded 7 hours and 23 minutes of sleep of which almost 25% with REM sleep.  The calculated apnea hypopnea index per hour was 22.1 and marginally higher in rem sleep in non-REM sleep he did not have prolonged oxygen desaturations, he had a normal pulse rate variability.  So this was moderate severe obstructive sleep apnea.  We initiated CPAP auto titration was 5-15 cmH2O to centimeter expiratory pressure relief and heated humidification and asked the DME to deliver a mask of the patient's choice.  There was also a strong supine component to his apnea and when the patient is not on his back his apnea is much milder than when sleeping supine.   Since he has received his machine he did at one time need to stop using CPAP therapy because of a sinus infection which is understandable.  The patient received his machine on October 09, 2020 and this is one of the non-ResMed machines that we have recently started to use.  His 95th percentile pressure was 11.2 cmH2O he had a 60% compliance because of the 10 days of sinusitis.  His AHI was not average.  It seems to be around 12 still and he started noticing a large air leak.  HIS MACHINE DISPLAYs a 2016 date.  We reached out to manager at [employer] and will ask him to take the machine back and reset.  I  asked him to sleep on his side.         Chief concern according to patient : Dr. Larose Kells have related to a note from 4 February that stated that the patient had last laboratory checkups with him in 03-18-20 and there was no particular etiology found on the blood tests that would explain the patient's excessive daytime sleepiness or fatigue.  Blood pressure was not found to be over controlled, from time to time the patient takes an antihistamine medication he had stopped taking folate acid a couple of weeks prior to the phone call and was asked to resume that back.  He is sent here today to rule out a sleep disorder and also to see if other factors may contribute to the degree of sleepiness.  The patient has a history of depression but on his PHQ-9 score he scored 8 points and subjectively reports feeling better on fluoxetine the generic form of Prozac.  Tylenol PM Benadryl and Flexeril were taken off the medication list at the time of the referral.     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 Cataract,  Constipation, DVT (  deep venous thrombosis) leading repeatedly to PE-s [facility]), Glaucoma, Hyperlipidemia, Hypertension, Melanoma ([place]), Mitral valve prolapse,  Multiple PE (pulmonary embolism 200- 2022), Prostate cancer ([place]), and Seasonal allergies.  The patient never has had a sleep study.    Sleep relevant medical history: Nocturia once- twice, eye enucleation, repeated PEs.  Depression.    Family medical /sleep history: Younger brother on CPAP with OSA, insomnia, no sleep walkers.    Social history:  Patient is retired from [employer] - travelled [place].  and lives in a household with spouse -no children and no pets.  Pets are ** present. Tobacco use- never .  ETOH use ; none ,  Caffeine intake in form of Coffee( 1 cup a day) Soda( not since retirment) Tea (/) or  energy drinks. Regular exercise in form of -not in the last 6 month. .         Sleep habits are as follows: The patient's dinner time is between 6 PM. The patient goes to bed at 10-11 PM and continues to sleep for 7-8 hours, wakes for one bathroom breaks, the first time at 2-3 AM.   The preferred sleep position is laterally, on his right , with the support of 1 pillow.  Dreams are reportedly rare.  7.30 AM is the usual rise time. The patient woken up by wife .  He  reports not feeling refreshed or restored in AM, with symptoms such as dry mouth,  Dry eyes, postnasal drip,  morning  fatigue. Naps are taken frequently, lasting from 30 to 120 minutes .    Review of Systems: Out of a complete 14 system review, the patient complains of only the following symptoms, and all other reviewed systems are negative.:  Fatigue, sleepiness , snoring, fragmented sleep, I prolonged sleep time, naps. EDS, sleep myoclonus.   How likely are you to doze in the following situations: 0 = not likely, 1 = slight chance, 2 = moderate chance, 3 = high chance   Sitting and Reading? Watching Television? Sitting inactive in a public place (theater or meeting)? As a passenger in a car for an hour without a break? Lying down in the afternoon when circumstances permit? Sitting and talking to someone? Sitting quietly after lunch without alcohol? In a car, while stopped for a few minutes in traffic?   Total =12 from 16/ 24 points   FSS endorsed still at 57/ 63 points.   Social History   Socioeconomic History   Marital status: Married    Spouse name: Not on file   Number of children: 0   Years of education: Not on file   Highest education level: Not on file  Occupational History   Occupation: Retired    Fish farm manager: RETIRED  Tobacco Use   Smoking status: Never   Smokeless tobacco: Never  Vaping Use   Vaping Use: Never used  Substance and Sexual Activity   Alcohol use: No   Drug use: No   Sexual activity:  Not on file  Other Topics Concern   Not on file  Social History Narrative   Married, wife w/ melanoma, advanced, doing well as off 10/2019   no children    Moved to [place] 03-2016 (  at the independent area as off 10/2018)   Social Determinants of Health   Financial Resource Strain: Not on file  Food Insecurity: Not on file  Transportation Needs: Not on file  Physical Activity: Not on file  Stress: Not  on file  Social Connections: Not on file    Family History  Problem Relation Age of Onset   CAD Father 27   Hypertension Father    Glaucoma Mother    Diabetes Neg Hx    Colon cancer Neg Hx    Prostate cancer Neg Hx     Past Medical History:  Diagnosis Date   Constipation    DVT (deep venous thrombosis) ([place]) and many more since 3 times.     hx of 2001 (after prostate surgery)     Glaucoma    Hyperlipidemia    Hypertension    Melanoma ([place])    face, hand   Mitral valve prolapse    PE (pulmonary embolism) repeatedly.     5-09:was referred to hematology, and they recommend to discontinue Coumadin 5/11   Prostate cancer [facility])     released from urology in 2010,needs yearly PSAs with PCP   Seasonal allergies, post nasal drip     Past Surgical History:  Procedure Laterality Date   CATARACT EXTRACTION Bilateral    COLONOSCOPY     several   EYE SURGERY Bilateral    Cat Sx   PARS PLANA VITRECTOMY Left 09/06/2019   Procedure: PARS PLANA VITRECTOMY WITH 25 GAUGE WITH INTRAVITREAL ANTIBIOTICS;  Surgeon: [name], MD;  Location: [place];  Service: Ophthalmology;  Laterality: Left;   PHOTOCOAGULATION WITH LASER Left 09/06/2019   Procedure: Photocoagulation With Laser;  Surgeon: [name], MD;  Location: [place];  Service: Ophthalmology;  Laterality: Left;   PROSTATECTOMY  2000   RUPTURED GLOBE EXPLORATION AND REPAIR-   Left 08/16/2019   Procedure: OPEN GLOBE EXPLORATION EYE POSSIBLE ANTERIOR  CHAMBER [place] OUT;  Surgeon: Lonia Skinner, MD;  Location: [place];   Service: Ophthalmology;  Laterality: Left;     Current Outpatient Medications on File Prior to Visit  Medication Sig Dispense Refill   acetaminophen (TYLENOL) 500 MG tablet Take 1,000 mg by mouth every 6 (six) hours as needed for mild pain or headache.     apixaban (ELIQUIS) 2.5 MG TABS tablet Take 1 tablet (2.5 mg total) by mouth 2 (two) times daily. 90 tablet 3   atenolol (TENORMIN) 50 MG tablet Take 1 tablet (50 mg total) by mouth daily. 90 tablet 1   Azelastine HCl 0.15 % SOLN Place 2 sprays into the nose 2 (two) times daily. 30 mL 6   calcium carbonate (TUMS - DOSED IN MG ELEMENTAL CALCIUM) 500 MG chewable tablet Chew 1-2 tablets by mouth as needed for indigestion or heartburn.     Cholecalciferol (VITAMIN D3) 25 MCG (1000 UT) CAPS Take 1,000 Units by mouth daily.     docusate sodium (COLACE) 100 MG capsule Take 100 mg by mouth daily as needed for mild constipation.     dorzolamide-timolol (COSOPT) 22.3-6.8 MG/ML ophthalmic solution      fluocinonide (LIDEX) 0.05 % external solution Apply 1 application topically See admin instructions. Apply to the scalp as directed at bedtime     fluticasone (FLONASE) 50 MCG/ACT nasal spray Place 2 sprays into both nostrils daily as needed for allergies or rhinitis. 16 mL 5   folic acid (FOLVITE) A999333 MCG tablet Take 400 mcg by mouth daily.     Hypromellose (ARTIFICIAL TEARS OP) Place 1 drop into both eyes 3 (three) times daily as needed (for dryness).      levothyroxine (SYNTHROID) 25 MCG tablet Take 1 tablet (25 mcg total) by mouth in the morning and at bedtime. [place]  tablet 1   Multiple Vitamins-Minerals (MULTIVITAMIN,TX-MINERALS) tablet Take 1 tablet by mouth at bedtime.     NON FORMULARY Take 1-2 capsules by mouth See admin instructions. [place] capsules: Take 1 capsule by mouth once a day, alternating with 2 capsules every other day     polyethylene glycol (MIRALAX / GLYCOLAX) packet Take 17 g by mouth daily. MIX AND DRINK     pravastatin (PRAVACHOL) 40  MG tablet Take 1 tablet (40 mg total) by mouth daily. 90 tablet 1   Probiotic Product (ALIGN) 4 MG CAPS Take 4 mg by mouth daily.     Travoprost, BAK Free, (TRAVATAN) 0.004 % SOLN ophthalmic solution Place 1 drop into both eyes at bedtime.     No current facility-administered medications on file prior to visit.    Allergies  Allergen Reactions   Nitrofuran Derivatives Other (See Comments)    Caused headaches    Sulfonamide Derivatives Nausea And Vomiting    Physical exam:  Today's Vitals   12/23/20 1029  BP: 128/66  Pulse: 64  Weight: 176 lb (79.8 kg)  Height: 6' (1.829 m)   Body mass index is 23.87 kg/m.   Wt Readings from Last 3 Encounters:  12/23/20 176 lb (79.8 kg)  11/25/20 175 lb 4 oz (79.5 kg)  08/28/20 174 lb 8 oz (79.2 kg)     Ht Readings from Last 3 Encounters:  12/23/20 6' (1.829 m)  11/25/20 6' (1.829 m)  08/28/20 6' (1.829 m)      General: The patient is awake, alert and appears not in acute distress. The patient is well groomed. Head: Normocephalic, atraumatic. Neck is supple.  Mallampati 2,  neck circumference:15.5  inches .  Nasal airflow *is patent.  Retrognathia is not  seen.  Dental status: biological  Cardiovascular:  Regular rate and cardiac rhythm by pulse,  without distended neck veins. Respiratory: Lungs are clear to auscultation.  Skin:  Without evidence of ankle edema, or rash. Trunk: The patient's posture is erect.   Neurologic exam : The patient is awake and alert, oriented to place and time.   Memory subjective described as intact.  Attention span & concentration ability appears normal.  Speech is fluent,  without  dysarthria, dysphonia or aphasia.  Mood and affect are appropriate.   Cranial nerves: no loss of smell or taste reported   right Pupils are equal and briskly reactive to light. Funduscopic exam deferred.  Extraocular movements in vertical and horizontal planes were intact and without nystagmus. No Diplopia. Visual  fields by finger perimetry are intact. Hearing was intact .    Facial sensation intact to fine touch.  Facial motor strength is symmetric and tongue and uvula move midline.  Neck ROM : rotation, tilt and flexion extension were normal for age and shoulder shrug was symmetrical.    Motor exam:  Symmetric bulk, tone and ROM.   Abnormal elevated  tone with cog -wheeling, and jerking. Tremor noted  symmetrically weaker grip strength . Sensory:   bilaterally upper arms cramping.  Sometimes on one side at a time, wakes up form naps.  Fine touch  and vibration were felt in the upper extremities but not at either patella and blow.  Proprioception tested in the upper extremities was normal.   Coordination: Rapid alternating movements in the fingers/hands were of normal speed.  The Finger-to-nose maneuver was intact without evidence of ataxia, dysmetria or tremor.tremor not with action.    Gait and station: Patient could rise unassisted from a  seated position, walked cautiously without assistive device. Normal arm swing,  Turning 5 steps.  Stance is of normal width/ base . He reports having trouble walking on uneven ground, and walking on carpet.   Toe and heel walk were deferred.  Deep tendon reflexes: in the  upper and lower extremities are brisk- and  symmetric . Babinski response was deferred .    Labs reviewed.  After spending a total time of 12 minutes face to face and additional time for physical and neurologic examination, review of laboratory studies,  personal review of imaging studies, reports and results of other testing and review of referral information / records as far as provided in visit, I have established the following assessments:  EDS worsening over 2-3 years and much worse over the last 6 month.   1) EDS: high index of fatigue , sleeping a lot, and napping a lot, hypersomnia, OSA confirmed.  2) he is a loud snorer. not a narrow airway,  but history of DVT and PE ( several )   and now exhaustion with little physical challenge. 3) Torso and extremities twitching during sleep. abnormal muscle tone.  4) Jerky, twitchy affecting handwriting changes. Does not experience  REM BD.   5) anemia is corrected but MCH elevation.    My Plan is to proceed with:  Resetting the machine that was delivered to him- and if still high AHI , will proceed with   1) an attended sleep study , PL[facility]  [facility], preferred. Patient will be Ok with starting with a HST if insurance.  abnormal muscle tone is baffling, no parkinsonian features of gait.     I would like to thank  Colon Branch, [address] [place],  [place] [address] for allowing me to meet with and to take care of this pleasant patient.   In short, Brandon Burke is presenting with excessive daytime sleepiness.   I plan to follow up either personally or through our NP within 3-4 month.   CC: I will share my notes with PCP   Electronically signed by: Larey Seat, MD 12/23/2020 10:41 AM  [facility] Sleep Board certified by The [employer] of Sleep Medicine and Fellow of the [employer] of Neurology. Medical Director of [employer].

## 2020-12-25 ENCOUNTER — Telehealth: Payer: Self-pay | Admitting: Neurology

## 2020-12-25 NOTE — Telephone Encounter (Signed)
I called the pt back to get further information on this. Pt was set up on Ibreeze machine back in June and should have machine at home. Need more information one what he had reach out to the DME on? Ex. Does he needs supplies/mask refit?  Phone staff can relay message to the pt.

## 2020-12-25 NOTE — Telephone Encounter (Signed)
Pt returned call and stated that it is about the cpap not working and it being dated 2 years ago. Please advise.

## 2020-12-25 NOTE — Telephone Encounter (Signed)
Pt called states he called the DME on Tuesday about his CPAP machine. Says someone was supposed to call him back, but no nobody has reached out. Pt wanting to know if there is something we can do since he is without his machine. Pt requesting a call back.

## 2020-12-25 NOTE — Telephone Encounter (Signed)
Called pt back. He states his machine was not programmed correctly and has not been recording right for the last 9-10weeks. He has appt set up with [facility] on 12/21/20 at 11:30am to get this fixed. He will discuss any billing questions with them.

## 2021-01-01 ENCOUNTER — Telehealth: Payer: Self-pay | Admitting: Emergency Medicine

## 2021-01-09 ENCOUNTER — Other Ambulatory Visit: Payer: Self-pay

## 2021-01-09 MED ORDER — APIXABAN 2.5 MG PO TABS: 2.5000 mg | ORAL_TABLET | Freq: Two times a day (BID) | ORAL | 1 refills | Status: DC

## 2021-01-09 NOTE — Telephone Encounter (Signed)
Patient would like to schedule CT scan. Patient phone number is [phone].

## 2021-01-14 NOTE — Telephone Encounter (Signed)
[name] w/ [facility] will call pt directly to schedule pt's CT. Nothing further needed.

## 2021-01-22 ENCOUNTER — Ambulatory Visit: Payer: Self-pay | Admitting: Adult Health

## 2021-01-30 ENCOUNTER — Other Ambulatory Visit: Payer: Self-pay

## 2021-01-30 ENCOUNTER — Ambulatory Visit (HOSPITAL_BASED_OUTPATIENT_CLINIC_OR_DEPARTMENT_OTHER)
Admission: RE | Admit: 2021-01-30 | Discharge: 2021-01-30 | Disposition: A | Discharge status: Home / Self Care | Payer: PPO | Source: Ambulatory Visit | Attending: Emergency Medicine | Admitting: Emergency Medicine

## 2021-01-30 DIAGNOSIS — R911 Solitary pulmonary nodule: Secondary | ICD-10-CM | POA: Diagnosis not present

## 2021-01-30 DIAGNOSIS — I700 Atherosclerosis of aorta: Secondary | ICD-10-CM | POA: Diagnosis not present

## 2021-01-30 DIAGNOSIS — R918 Other nonspecific abnormal finding of lung field: Secondary | ICD-10-CM | POA: Diagnosis not present

## 2021-02-03 ENCOUNTER — Encounter (HOSPITAL_COMMUNITY): Payer: Self-pay | Admitting: Radiology

## 2021-02-03 ENCOUNTER — Inpatient Hospital Stay (HOSPITAL_COMMUNITY)
Admission: EM | Admit: 2021-02-03 | Discharge: 2021-02-11 | DRG: 206 | Disposition: A | Discharge status: Rehabilitation Facility | Payer: PPO | Attending: General Surgery | Admitting: General Surgery

## 2021-02-03 ENCOUNTER — Emergency Department (HOSPITAL_COMMUNITY): Payer: PPO

## 2021-02-03 DIAGNOSIS — S0990XA Unspecified injury of head, initial encounter: Secondary | ICD-10-CM

## 2021-02-03 DIAGNOSIS — S27321A Contusion of lung, unilateral, initial encounter: Secondary | ICD-10-CM

## 2021-02-03 DIAGNOSIS — S2242XA Multiple fractures of ribs, left side, initial encounter for closed fracture: Secondary | ICD-10-CM | POA: Diagnosis present

## 2021-02-03 DIAGNOSIS — W19XXXA Unspecified fall, initial encounter: Principal | ICD-10-CM

## 2021-02-03 DIAGNOSIS — T148XXA Other injury of unspecified body region, initial encounter: Secondary | ICD-10-CM

## 2021-02-03 DIAGNOSIS — S0181XA Laceration without foreign body of other part of head, initial encounter: Secondary | ICD-10-CM

## 2021-02-03 DIAGNOSIS — T1490XA Injury, unspecified, initial encounter: Secondary | ICD-10-CM

## 2021-02-03 DIAGNOSIS — R4020 Unspecified coma: Secondary | ICD-10-CM

## 2021-02-03 DIAGNOSIS — S2249XA Multiple fractures of ribs, unspecified side, initial encounter for closed fracture: Secondary | ICD-10-CM

## 2021-02-03 DIAGNOSIS — R918 Other nonspecific abnormal finding of lung field: Secondary | ICD-10-CM

## 2021-02-03 DIAGNOSIS — R404 Transient alteration of awareness: Secondary | ICD-10-CM | POA: Diagnosis not present

## 2021-02-03 DIAGNOSIS — I700 Atherosclerosis of aorta: Secondary | ICD-10-CM | POA: Diagnosis not present

## 2021-02-03 DIAGNOSIS — R58 Hemorrhage, not elsewhere classified: Secondary | ICD-10-CM | POA: Diagnosis not present

## 2021-02-03 DIAGNOSIS — S060XAA Concussion with loss of consciousness status unknown, initial encounter: Secondary | ICD-10-CM | POA: Diagnosis present

## 2021-02-03 DIAGNOSIS — Z882 Allergy status to sulfonamides status: Secondary | ICD-10-CM

## 2021-02-03 DIAGNOSIS — Z79899 Other long term (current) drug therapy: Secondary | ICD-10-CM

## 2021-02-03 DIAGNOSIS — N400 Enlarged prostate without lower urinary tract symptoms: Secondary | ICD-10-CM | POA: Diagnosis present

## 2021-02-03 DIAGNOSIS — S01112A Laceration without foreign body of left eyelid and periocular area, initial encounter: Secondary | ICD-10-CM | POA: Diagnosis present

## 2021-02-03 DIAGNOSIS — E039 Hypothyroidism, unspecified: Secondary | ICD-10-CM | POA: Diagnosis not present

## 2021-02-03 DIAGNOSIS — Z79890 Hormone replacement therapy (postmenopausal): Secondary | ICD-10-CM

## 2021-02-03 DIAGNOSIS — S80211A Abrasion, right knee, initial encounter: Secondary | ICD-10-CM | POA: Diagnosis present

## 2021-02-03 DIAGNOSIS — F05 Delirium due to known physiological condition: Secondary | ICD-10-CM | POA: Diagnosis not present

## 2021-02-03 DIAGNOSIS — Z23 Encounter for immunization: Secondary | ICD-10-CM | POA: Diagnosis not present

## 2021-02-03 DIAGNOSIS — N390 Urinary tract infection, site not specified: Secondary | ICD-10-CM | POA: Diagnosis present

## 2021-02-03 DIAGNOSIS — Y9289 Other specified places as the place of occurrence of the external cause: Secondary | ICD-10-CM

## 2021-02-03 DIAGNOSIS — Z86711 Personal history of pulmonary embolism: Secondary | ICD-10-CM

## 2021-02-03 DIAGNOSIS — E785 Hyperlipidemia, unspecified: Secondary | ICD-10-CM | POA: Diagnosis present

## 2021-02-03 DIAGNOSIS — Z043 Encounter for examination and observation following other accident: Secondary | ICD-10-CM | POA: Diagnosis not present

## 2021-02-03 DIAGNOSIS — Z881 Allergy status to other antibiotic agents status: Secondary | ICD-10-CM

## 2021-02-03 DIAGNOSIS — W108XXA Fall (on) (from) other stairs and steps, initial encounter: Secondary | ICD-10-CM | POA: Diagnosis present

## 2021-02-03 DIAGNOSIS — S80212A Abrasion, left knee, initial encounter: Secondary | ICD-10-CM | POA: Diagnosis present

## 2021-02-03 DIAGNOSIS — S0083XA Contusion of other part of head, initial encounter: Secondary | ICD-10-CM | POA: Diagnosis not present

## 2021-02-03 DIAGNOSIS — S0181XD Laceration without foreign body of other part of head, subsequent encounter: Secondary | ICD-10-CM | POA: Diagnosis not present

## 2021-02-03 DIAGNOSIS — S0993XA Unspecified injury of face, initial encounter: Secondary | ICD-10-CM | POA: Diagnosis not present

## 2021-02-03 DIAGNOSIS — G4700 Insomnia, unspecified: Secondary | ICD-10-CM | POA: Diagnosis not present

## 2021-02-03 DIAGNOSIS — R519 Headache, unspecified: Secondary | ICD-10-CM | POA: Diagnosis present

## 2021-02-03 DIAGNOSIS — K5903 Drug induced constipation: Secondary | ICD-10-CM | POA: Diagnosis not present

## 2021-02-03 DIAGNOSIS — S069X0D Unspecified intracranial injury without loss of consciousness, subsequent encounter: Secondary | ICD-10-CM | POA: Diagnosis not present

## 2021-02-03 DIAGNOSIS — Z20822 Contact with and (suspected) exposure to covid-19: Secondary | ICD-10-CM | POA: Diagnosis present

## 2021-02-03 DIAGNOSIS — Z8546 Personal history of malignant neoplasm of prostate: Secondary | ICD-10-CM

## 2021-02-03 DIAGNOSIS — Z86718 Personal history of other venous thrombosis and embolism: Secondary | ICD-10-CM | POA: Diagnosis not present

## 2021-02-03 DIAGNOSIS — S069X0S Unspecified intracranial injury without loss of consciousness, sequela: Secondary | ICD-10-CM | POA: Diagnosis not present

## 2021-02-03 DIAGNOSIS — S3993XA Unspecified injury of pelvis, initial encounter: Secondary | ICD-10-CM | POA: Diagnosis not present

## 2021-02-03 DIAGNOSIS — J9811 Atelectasis: Secondary | ICD-10-CM | POA: Diagnosis not present

## 2021-02-03 DIAGNOSIS — S069X1D Unspecified intracranial injury with loss of consciousness of 30 minutes or less, subsequent encounter: Secondary | ICD-10-CM | POA: Diagnosis not present

## 2021-02-03 DIAGNOSIS — J90 Pleural effusion, not elsewhere classified: Secondary | ICD-10-CM | POA: Diagnosis present

## 2021-02-03 DIAGNOSIS — S060X9A Concussion with loss of consciousness of unspecified duration, initial encounter: Secondary | ICD-10-CM | POA: Diagnosis not present

## 2021-02-03 DIAGNOSIS — Z7901 Long term (current) use of anticoagulants: Secondary | ICD-10-CM | POA: Diagnosis not present

## 2021-02-03 DIAGNOSIS — S2242XD Multiple fractures of ribs, left side, subsequent encounter for fracture with routine healing: Secondary | ICD-10-CM | POA: Diagnosis not present

## 2021-02-03 DIAGNOSIS — S3991XA Unspecified injury of abdomen, initial encounter: Secondary | ICD-10-CM | POA: Diagnosis not present

## 2021-02-03 DIAGNOSIS — S069X1S Unspecified intracranial injury with loss of consciousness of 30 minutes or less, sequela: Secondary | ICD-10-CM | POA: Diagnosis not present

## 2021-02-03 DIAGNOSIS — S2249XS Multiple fractures of ribs, unspecified side, sequela: Secondary | ICD-10-CM | POA: Diagnosis present

## 2021-02-03 DIAGNOSIS — S50812A Abrasion of left forearm, initial encounter: Secondary | ICD-10-CM | POA: Diagnosis present

## 2021-02-03 DIAGNOSIS — R220 Localized swelling, mass and lump, head: Secondary | ICD-10-CM | POA: Diagnosis not present

## 2021-02-03 DIAGNOSIS — R7401 Elevation of levels of liver transaminase levels: Secondary | ICD-10-CM | POA: Diagnosis not present

## 2021-02-03 DIAGNOSIS — R9431 Abnormal electrocardiogram [ECG] [EKG]: Secondary | ICD-10-CM | POA: Diagnosis not present

## 2021-02-03 DIAGNOSIS — S060X0A Concussion without loss of consciousness, initial encounter: Secondary | ICD-10-CM | POA: Diagnosis not present

## 2021-02-03 DIAGNOSIS — B957 Other staphylococcus as the cause of diseases classified elsewhere: Secondary | ICD-10-CM | POA: Diagnosis not present

## 2021-02-03 DIAGNOSIS — R410 Disorientation, unspecified: Secondary | ICD-10-CM | POA: Diagnosis not present

## 2021-02-03 DIAGNOSIS — S2242XS Multiple fractures of ribs, left side, sequela: Secondary | ICD-10-CM | POA: Diagnosis not present

## 2021-02-03 DIAGNOSIS — I10 Essential (primary) hypertension: Secondary | ICD-10-CM | POA: Diagnosis not present

## 2021-02-03 DIAGNOSIS — R4587 Impulsiveness: Secondary | ICD-10-CM | POA: Diagnosis not present

## 2021-02-03 DIAGNOSIS — M4312 Spondylolisthesis, cervical region: Secondary | ICD-10-CM | POA: Diagnosis not present

## 2021-02-03 DIAGNOSIS — S060XAD Concussion with loss of consciousness status unknown, subsequent encounter: Secondary | ICD-10-CM | POA: Diagnosis not present

## 2021-02-03 DIAGNOSIS — A499 Bacterial infection, unspecified: Secondary | ICD-10-CM | POA: Diagnosis not present

## 2021-02-03 DIAGNOSIS — D62 Acute posthemorrhagic anemia: Secondary | ICD-10-CM | POA: Diagnosis not present

## 2021-02-03 LAB — URINALYSIS, MICROSCOPIC (REFLEX)
RBC / HPF: NONE SEEN RBC/hpf (ref 0–5)
Squamous Epithelial / HPF: NONE SEEN (ref 0–5)
WBC, UA: >50 WBC/hpf (ref 0–5)

## 2021-02-03 LAB — CBC
HCT: 44.6 % (ref 39.0–52.0)
Hemoglobin: 14.6 g/dL (ref 13.0–17.0)
MCH: 32.5 pg (ref 26.0–34.0)
MCHC: 32.7 g/dL (ref 30.0–36.0)
MCV: 99.3 fL (ref 80.0–100.0)
Platelets: 151 10*3/uL (ref 150–400)
RBC: 4.49 MIL/uL (ref 4.22–5.81)
RDW: 12.5 % (ref 11.5–15.5)
WBC: 9.8 10*3/uL (ref 4.0–10.5)
nRBC: 0 % (ref 0.0–0.2)

## 2021-02-03 LAB — I-STAT CHEM 8, ED
BUN: 21 mg/dL (ref 8–23)
Calcium, Ion: 1.12 mmol/L — ABNORMAL LOW (ref 1.15–1.40)
Chloride: 107 mmol/L (ref 98–111)
Creatinine, Ser: 1.3 mg/dL — ABNORMAL HIGH (ref 0.61–1.24)
Glucose, Bld: 122 mg/dL — ABNORMAL HIGH (ref 70–99)
HCT: 42 % (ref 39.0–52.0)
Hemoglobin: 14.3 g/dL (ref 13.0–17.0)
Potassium: 4.2 mmol/L (ref 3.5–5.1)
Sodium: 140 mmol/L (ref 135–145)
TCO2: 23 mmol/L (ref 22–32)

## 2021-02-03 LAB — URINALYSIS, ROUTINE W REFLEX MICROSCOPIC
Bilirubin Urine: NEGATIVE
Glucose, UA: NEGATIVE mg/dL
Ketones, ur: NEGATIVE mg/dL
Nitrite: POSITIVE — AB
Protein, ur: NEGATIVE mg/dL
Specific Gravity, Urine: 1.01 (ref 1.005–1.030)
pH: 7 (ref 5.0–8.0)

## 2021-02-03 LAB — COMPREHENSIVE METABOLIC PANEL
ALT: 28 U/L (ref 0–44)
AST: 26 U/L (ref 15–41)
Albumin: 3.5 g/dL (ref 3.5–5.0)
Alkaline Phosphatase: 70 U/L (ref 38–126)
Anion gap: 6 (ref 5–15)
BUN: 21 mg/dL (ref 8–23)
CO2: 25 mmol/L (ref 22–32)
Calcium: 8.9 mg/dL (ref 8.9–10.3)
Chloride: 107 mmol/L (ref 98–111)
Creatinine, Ser: 1.3 mg/dL — ABNORMAL HIGH (ref 0.61–1.24)
GFR, Estimated: 53 mL/min — ABNORMAL LOW (ref >60)
Glucose, Bld: 128 mg/dL — ABNORMAL HIGH (ref 70–99)
Potassium: 4.2 mmol/L (ref 3.5–5.1)
Sodium: 138 mmol/L (ref 135–145)
Total Bilirubin: 0.6 mg/dL (ref 0.3–1.2)
Total Protein: 6.6 g/dL (ref 6.5–8.1)

## 2021-02-03 LAB — RESP PANEL BY RT-PCR (FLU A&B, COVID) ARPGX2
Influenza A by PCR: NEGATIVE
Influenza B by PCR: NEGATIVE
SARS Coronavirus 2 by RT PCR: NEGATIVE

## 2021-02-03 LAB — LACTIC ACID, PLASMA: Lactic Acid, Venous: 2 mmol/L (ref 0.5–1.9)

## 2021-02-03 LAB — PROTIME-INR
INR: 1.1 (ref 0.8–1.2)
Prothrombin Time: 14.4 seconds (ref 11.4–15.2)

## 2021-02-03 LAB — ETHANOL: Alcohol, Ethyl (B): <10 mg/dL (ref <10)

## 2021-02-03 LAB — SAMPLE TO BLOOD BANK

## 2021-02-03 MED ORDER — FENTANYL CITRATE PF 50 MCG/ML IJ SOSY
50.0000 ug | PREFILLED_SYRINGE | Freq: Once | INTRAMUSCULAR | Status: AC
Start: 2021-02-03 — End: 2021-02-03
  Administered 2021-02-03: 50 ug via INTRAVENOUS
  Filled 2021-02-03: qty 1

## 2021-02-03 MED ORDER — IOHEXOL 300 MG/ML  SOLN
100.0000 mL | Freq: Once | INTRAMUSCULAR | Status: AC | PRN
  Administered 2021-02-03: 100 mL via INTRAVENOUS

## 2021-02-03 MED ORDER — LIDOCAINE-EPINEPHRINE (PF) 2 %-1:200000 IJ SOLN
20.0000 mL | Freq: Once | INTRAMUSCULAR | Status: AC
  Administered 2021-02-03: 20 mL
  Filled 2021-02-03: qty 20

## 2021-02-03 MED ORDER — TETANUS-DIPHTH-ACELL PERTUSSIS 5-2.5-18.5 LF-MCG/0.5 IM SUSY
0.5000 mL | PREFILLED_SYRINGE | Freq: Once | INTRAMUSCULAR | Status: AC
  Administered 2021-02-03: 0.5 mL via INTRAMUSCULAR
  Filled 2021-02-03: qty 0.5

## 2021-02-03 NOTE — ED Notes (Signed)
.  Trauma Response Nurse Note-  Reason for Call / Reason for Trauma activation:  Level 2 activation- Fall on thinners while leaving barbershop.   Initial Focused Assessment (If applicable, or please see trauma documentation):  On arrival, pt's face is covered with dried blood- EMS had bandaged his head with 4x4's and kling-- once removed, pt has multiple abrasion to left side of face/head, small lac above left eye. Also has multiple skintears to hands and abrasions to both knees.  IV x 2 per EMS- 18G right AC, 18G left forearm.   Interventions: Xrays CT scans Labs Admit  Plan of Care as of this note:  Admit to trauma   Rolene Arbour, RN  Trauma Response Nurse [phone]

## 2021-02-03 NOTE — ED Notes (Signed)
Pt stating he does not remember going to the barber and that he was with his wife and does not know how they were separated. Patient does not remember falling at all. Patient currently stating that he and his wife are here at this hospital for another patient and got separated.

## 2021-02-03 NOTE — Progress Notes (Signed)
This chaplain responded to Level 2 Trauma with the medical team. The chaplain was called away and understands the RN will phone spiritual care as needed.

## 2021-02-03 NOTE — ED Triage Notes (Signed)
Pt BIB [facility] from barber shop after fall on stone steps outside when leaving. Patient positive LOC ~1 min according to off duty officer on scene. 1cm lac to left eyebrow, with abrasion noted on temple. Pt also has bilateral skin tears on knees. Pt currently on eliquis. Pt A&O x3 for EMS, A&O x4 currently. Patient reports no pain at this time.

## 2021-02-03 NOTE — H&P (Signed)
Brandon Burke is an 87 y.o. male.   Chief Complaint: Fall, head and L rib pain HPI: 87yo M with PMHx DVT/PE on Eliquis, prostate CA, HTN, and MVP was going to the bank when he had an unwitnessed fall. He was evaluated as a level 2 trauma. Unknown LOC. He is amnestic to the event. W/U in the ED shows L rib FX 3-8 with pleural hematoma. I was asked to see him for admission. His wife and son are present and helped with his history.  PMHx above  No family history on file. Social History:  has no history on file for tobacco use, alcohol use, and drug use.  Allergies:  Allergies  Allergen Reactions   Misc. Sulfonamide Containing Compounds    Nitrofuran Derivatives     (Not in a hospital admission)   Results for orders placed or performed during the hospital encounter of 02/03/21 (from the past 48 hour(s))  Comprehensive metabolic panel     Status: Abnormal   Collection Time: 02/03/21  4:08 PM  Result Value Ref Range   Sodium 138 135 - 145 mmol/L   Potassium 4.2 3.5 - 5.1 mmol/L   Chloride 107 98 - 111 mmol/L   CO2 25 22 - 32 mmol/L   Glucose, Bld 128 (H) 70 - 99 mg/dL    Comment: Glucose reference range applies only to samples taken after fasting for at least 8 hours.   BUN 21 8 - 23 mg/dL   Creatinine, Ser 1.30 (H) 0.61 - 1.24 mg/dL   Calcium 8.9 8.9 - 10.3 mg/dL   Total Protein 6.6 6.5 - 8.1 g/dL   Albumin 3.5 3.5 - 5.0 g/dL   AST 26 15 - 41 U/L   ALT 28 0 - 44 U/L   Alkaline Phosphatase 70 38 - 126 U/L   Total Bilirubin 0.6 0.3 - 1.2 mg/dL   GFR, Estimated 53 (L) >60 mL/min    Comment: (NOTE) Calculated using the CKD-EPI Creatinine Equation (2021)    Anion gap 6 5 - 15    Comment: Performed at [facility] [address] [place], [place] [address]  CBC     Status: None   Collection Time: 02/03/21  4:08 PM  Result Value Ref Range   WBC 9.8 4.0 - 10.5 K/uL   RBC 4.49 4.22 - 5.81 MIL/uL   Hemoglobin 14.6 13.0 - 17.0 g/dL   HCT 44.6 39.0 - 52.0 %   MCV 99.3  80.0 - 100.0 fL   MCH 32.5 26.0 - 34.0 pg   MCHC 32.7 30.0 - 36.0 g/dL   RDW 12.5 11.5 - 15.5 %   Platelets 151 150 - 400 K/uL   nRBC 0.0 0.0 - 0.2 %    Comment: Performed at [facility], [place] [address] [place], [place] [address]  Ethanol     Status: None   Collection Time: 02/03/21  4:08 PM  Result Value Ref Range   Alcohol, Ethyl (B) <10 <10 mg/dL    Comment: (NOTE) Lowest detectable limit for serum alcohol is 10 mg/dL.  For medical purposes only. Performed at [facility], [place] [address], [place], [place] [address]   Urinalysis, Routine w reflex microscopic Urine, Clean Catch     Status: Abnormal   Collection Time: 02/03/21  4:08 PM  Result Value Ref Range   Color, Urine YELLOW YELLOW   APPearance CLOUDY (A) CLEAR   Specific Gravity, Urine 1.010 1.005 - 1.030   pH  7.0 5.0 - 8.0   Glucose, UA NEGATIVE NEGATIVE mg/dL   Hgb urine dipstick SMALL (A) NEGATIVE   Bilirubin Urine NEGATIVE NEGATIVE   Ketones, ur NEGATIVE NEGATIVE mg/dL   Protein, ur NEGATIVE NEGATIVE mg/dL   Nitrite POSITIVE (A) NEGATIVE   Leukocytes,Ua SMALL (A) NEGATIVE    Comment: Performed at [facility] [address] [place], [place] [address]  Lactic acid, plasma     Status: Abnormal   Collection Time: 02/03/21  4:08 PM  Result Value Ref Range   Lactic Acid, Venous 2.0 (HH) 0.5 - 1.9 mmol/L    Comment: CRITICAL RESULT CALLED TO, READ BACK BY AND VERIFIED WITH:  B. [place], [place], 1287, 02/03/21, E. ADEDOKUN. Performed at [facility], [place] [address] [place], [place] [address]   Protime-INR     Status: None   Collection Time: 02/03/21  4:08 PM  Result Value Ref Range   Prothrombin Time 14.4 11.4 - 15.2 seconds   INR 1.1 0.8 - 1.2    Comment: (NOTE) INR goal varies based on device and disease states. Performed at [facility], [place] [address] [place], [place] [address]   Urinalysis, Microscopic (reflex)     Status: Abnormal   Collection Time: 02/03/21  4:08 PM  Result  Value Ref Range   RBC / HPF NONE SEEN 0 - 5 RBC/hpf   WBC, UA >50 0 - 5 WBC/hpf   Bacteria, UA MANY (A) NONE SEEN   Squamous Epithelial / LPF NONE SEEN 0 - 5    Comment: Performed at [facility], [place] [address], [place], [place] [address]  Sample to Blood Bank     Status: None   Collection Time: 02/03/21  4:10 PM  Result Value Ref Range   Blood Bank Specimen SAMPLE AVAILABLE FOR TESTING    Sample Expiration      02/04/2021,2359 Performed at [facility], [place] [address] [place], [place] [address]   I-Stat Chem 8, ED     Status: Abnormal   Collection Time: 02/03/21  4:19 PM  Result Value Ref Range   Sodium 140 135 - 145 mmol/L   Potassium 4.2 3.5 - 5.1 mmol/L   Chloride 107 98 - 111 mmol/L   BUN 21 8 - 23 mg/dL   Creatinine, Ser 1.30 (H) 0.61 - 1.24 mg/dL   Glucose, Bld 122 (H) 70 - 99 mg/dL    Comment: Glucose reference range applies only to samples taken after fasting for at least 8 hours.   Calcium, Ion 1.12 (L) 1.15 - 1.40 mmol/L   TCO2 23 22 - 32 mmol/L   Hemoglobin 14.3 13.0 - 17.0 g/dL   HCT 42.0 39.0 - 52.0 %   CT HEAD WO CONTRAST  Result Date: 02/03/2021 CLINICAL DATA:  Facial trauma, fall on cement steps. EXAM: CT HEAD WITHOUT CONTRAST CT MAXILLOFACIAL WITHOUT CONTRAST CT CERVICAL SPINE WITHOUT CONTRAST TECHNIQUE: Multidetector CT imaging of the head, cervical spine, and maxillofacial structures were performed using the standard protocol without intravenous contrast. Multiplanar CT image reconstructions of the cervical spine and maxillofacial structures were also generated. COMPARISON:  CT head and maxillofacial 08/09/2019 FINDINGS: CT HEAD FINDINGS Brain: No evidence of acute infarction, hemorrhage, hydrocephalus, extra-axial collection or mass lesion/mass effect. Again seen is mild diffuse atrophy. There is stable mild periventricular white matter hypodensity, likely chronic small vessel ischemic change. Small old infarct in the right frontal periventricular  white matter appears new from the prior examination.  Vascular: Atherosclerotic calcifications are present within the cavernous internal carotid arteries. Skull: Normal. Negative for fracture or focal lesion. Other: There is left temporal scalp soft tissue swelling. CT MAXILLOFACIAL FINDINGS Osseous: No acute fractures are identified. There are old left medial and left inferior orbital wall fractures. No dislocation. Orbits: There is preseptal left orbital soft tissue swelling. Globes are intact bilaterally. Postseptal orbital soft tissues are within normal limits. Sinuses: There is scattered opacification of left ethmoid air cells. There is mild mucosal thickening of the left maxillary sinus. No air-fluid levels are seen. The mastoid air cells appear clear. Soft tissues: Negative. CT CERVICAL SPINE FINDINGS Alignment: There is trace anterolisthesis at C4-C5 which is favored is degenerative. Alignment is otherwise anatomic. Skull base and vertebrae: No acute fracture. No primary bone lesion or focal pathologic process. Soft tissues and spinal canal: No prevertebral fluid or swelling. No visible canal hematoma. Disc levels: There is disc space narrowing throughout the cervical spine most significant at C3-C4, C5-C6 and C6-C7 compatible with degenerative change. Facet arthropathy is seen bilaterally. There is no severe central canal or neural foraminal stenosis. Upper chest: Negative. Other: None IMPRESSION: 1. No acute intracranial process. 2. No acute fracture or traumatic subluxation of cervical spine. 3. No acute facial fracture. 4. Mild progression of chronic small vessel ischemic change in the brain. 5. Old left orbital wall fractures. Electronically Signed   By: Ronney Asters M.D.   On: 02/03/2021 17:39   CT CHEST W CONTRAST  Result Date: 02/03/2021 CLINICAL DATA:  Abdominal trauma, fell on cement steps. EXAM: CT CHEST, ABDOMEN, AND PELVIS WITH CONTRAST TECHNIQUE: Multidetector CT imaging of the chest,  abdomen and pelvis was performed following the standard protocol during bolus administration of intravenous contrast. CONTRAST:  134mL OMNIPAQUE IOHEXOL 300 MG/ML  SOLN COMPARISON:  None. CT chest 01/30/2021. FINDINGS: CT CHEST FINDINGS Cardiovascular: No significant vascular findings. Normal heart size. No pericardial effusion. There are atherosclerotic calcifications of the aorta and coronary arteries Mediastinum/Nodes: No enlarged mediastinal, hilar, or axillary lymph nodes. Thyroid gland, trachea, and esophagus demonstrate no significant findings. Right lower pericardial cyst appears unchanged from prior. Lungs/Pleura: There is some patchy airspace and ground-glass opacity new left lower lobe and minimal patchy ground-glass opacity in the right lower lobe. Elongated nodular density in the right middle lobe measures 3.1 by 1.0 cm and has slightly increased in size when compared to the prior examination. There is a small amount of pleural fluid adjacent to rib fractures posteriorly in the left hemithorax. There is no evidence for pneumothorax. Musculoskeletal: There are nondisplaced posterior left third fourth and eighth rib fractures. There are displaced left fifth, sixth and seventh posterior rib fractures. CT ABDOMEN PELVIS FINDINGS Hepatobiliary: There is a cyst in the inferior right lobe of the liver measuring 3.2 cm. There are additional rounded hypodensities in the liver which are too small to characterize, also likely cysts. Gallbladder and bile ducts are within normal limits. Pancreas: Unremarkable. No pancreatic ductal dilatation or surrounding inflammatory changes. Spleen: No splenic injury or perisplenic hematoma. Adrenals/Urinary Tract: There are cortical hypodensities in the right kidney which are too small to characterize, most likely cysts. The kidneys and adrenal glands are otherwise within normal limits. Bladder is distended and within normal limits. Stomach/Bowel: Stomach is within normal  limits. Appendix appears normal. No evidence of bowel wall thickening, distention, or inflammatory changes. There is a large amount of stool throughout the colon. Vascular/Lymphatic: Aortic atherosclerosis. No enlarged abdominal or pelvic lymph nodes. Reproductive: Status post  prostatectomy. Other: There is a small fat containing umbilical hernia. There is no ascites. There is no body wall hematoma. Musculoskeletal: No acute or significant osseous findings. IMPRESSION: 1. Acute posterior left third through eighth rib fractures. Adjacent pleural thickening/hematoma. No pneumothorax. 2. Left lower lobe patchy airspace disease worrisome for infection. Pulmonary contusion not excluded. 3. Elongated lobulated density in the right middle lobe has increased in size. This is indeterminate. Neoplasm cannot be excluded. Consider follow-up PET-CT or tissue sampling. 4. No acute localizing process in the abdomen or pelvis. Electronically Signed   By: Ronney Asters M.D.   On: 02/03/2021 17:54   CT CERVICAL SPINE WO CONTRAST  Result Date: 02/03/2021 CLINICAL DATA:  Facial trauma, fall on cement steps. EXAM: CT HEAD WITHOUT CONTRAST CT MAXILLOFACIAL WITHOUT CONTRAST CT CERVICAL SPINE WITHOUT CONTRAST TECHNIQUE: Multidetector CT imaging of the head, cervical spine, and maxillofacial structures were performed using the standard protocol without intravenous contrast. Multiplanar CT image reconstructions of the cervical spine and maxillofacial structures were also generated. COMPARISON:  CT head and maxillofacial 08/09/2019 FINDINGS: CT HEAD FINDINGS Brain: No evidence of acute infarction, hemorrhage, hydrocephalus, extra-axial collection or mass lesion/mass effect. Again seen is mild diffuse atrophy. There is stable mild periventricular white matter hypodensity, likely chronic small vessel ischemic change. Small old infarct in the right frontal periventricular white matter appears new from the prior examination. Vascular:  Atherosclerotic calcifications are present within the cavernous internal carotid arteries. Skull: Normal. Negative for fracture or focal lesion. Other: There is left temporal scalp soft tissue swelling. CT MAXILLOFACIAL FINDINGS Osseous: No acute fractures are identified. There are old left medial and left inferior orbital wall fractures. No dislocation. Orbits: There is preseptal left orbital soft tissue swelling. Globes are intact bilaterally. Postseptal orbital soft tissues are within normal limits. Sinuses: There is scattered opacification of left ethmoid air cells. There is mild mucosal thickening of the left maxillary sinus. No air-fluid levels are seen. The mastoid air cells appear clear. Soft tissues: Negative. CT CERVICAL SPINE FINDINGS Alignment: There is trace anterolisthesis at C4-C5 which is favored is degenerative. Alignment is otherwise anatomic. Skull base and vertebrae: No acute fracture. No primary bone lesion or focal pathologic process. Soft tissues and spinal canal: No prevertebral fluid or swelling. No visible canal hematoma. Disc levels: There is disc space narrowing throughout the cervical spine most significant at C3-C4, C5-C6 and C6-C7 compatible with degenerative change. Facet arthropathy is seen bilaterally. There is no severe central canal or neural foraminal stenosis. Upper chest: Negative. Other: None IMPRESSION: 1. No acute intracranial process. 2. No acute fracture or traumatic subluxation of cervical spine. 3. No acute facial fracture. 4. Mild progression of chronic small vessel ischemic change in the brain. 5. Old left orbital wall fractures. Electronically Signed   By: Ronney Asters M.D.   On: 02/03/2021 17:39   CT ABDOMEN PELVIS W CONTRAST  Result Date: 02/03/2021 CLINICAL DATA:  Abdominal trauma, fell on cement steps. EXAM: CT CHEST, ABDOMEN, AND PELVIS WITH CONTRAST TECHNIQUE: Multidetector CT imaging of the chest, abdomen and pelvis was performed following the standard  protocol during bolus administration of intravenous contrast. CONTRAST:  161mL OMNIPAQUE IOHEXOL 300 MG/ML  SOLN COMPARISON:  None. CT chest 01/30/2021. FINDINGS: CT CHEST FINDINGS Cardiovascular: No significant vascular findings. Normal heart size. No pericardial effusion. There are atherosclerotic calcifications of the aorta and coronary arteries Mediastinum/Nodes: No enlarged mediastinal, hilar, or axillary lymph nodes. Thyroid gland, trachea, and esophagus demonstrate no significant findings. Right lower pericardial cyst appears  unchanged from prior. Lungs/Pleura: There is some patchy airspace and ground-glass opacity new left lower lobe and minimal patchy ground-glass opacity in the right lower lobe. Elongated nodular density in the right middle lobe measures 3.1 by 1.0 cm and has slightly increased in size when compared to the prior examination. There is a small amount of pleural fluid adjacent to rib fractures posteriorly in the left hemithorax. There is no evidence for pneumothorax. Musculoskeletal: There are nondisplaced posterior left third fourth and eighth rib fractures. There are displaced left fifth, sixth and seventh posterior rib fractures. CT ABDOMEN PELVIS FINDINGS Hepatobiliary: There is a cyst in the inferior right lobe of the liver measuring 3.2 cm. There are additional rounded hypodensities in the liver which are too small to characterize, also likely cysts. Gallbladder and bile ducts are within normal limits. Pancreas: Unremarkable. No pancreatic ductal dilatation or surrounding inflammatory changes. Spleen: No splenic injury or perisplenic hematoma. Adrenals/Urinary Tract: There are cortical hypodensities in the right kidney which are too small to characterize, most likely cysts. The kidneys and adrenal glands are otherwise within normal limits. Bladder is distended and within normal limits. Stomach/Bowel: Stomach is within normal limits. Appendix appears normal. No evidence of bowel wall  thickening, distention, or inflammatory changes. There is a large amount of stool throughout the colon. Vascular/Lymphatic: Aortic atherosclerosis. No enlarged abdominal or pelvic lymph nodes. Reproductive: Status post prostatectomy. Other: There is a small fat containing umbilical hernia. There is no ascites. There is no body wall hematoma. Musculoskeletal: No acute or significant osseous findings. IMPRESSION: 1. Acute posterior left third through eighth rib fractures. Adjacent pleural thickening/hematoma. No pneumothorax. 2. Left lower lobe patchy airspace disease worrisome for infection. Pulmonary contusion not excluded. 3. Elongated lobulated density in the right middle lobe has increased in size. This is indeterminate. Neoplasm cannot be excluded. Consider follow-up PET-CT or tissue sampling. 4. No acute localizing process in the abdomen or pelvis. Electronically Signed   By: Ronney Asters M.D.   On: 02/03/2021 17:54   DG Pelvis Portable  Result Date: 02/03/2021 CLINICAL DATA:  Status post fall on blood thinners.  Trauma. EXAM: PORTABLE PELVIS 1-2 VIEWS COMPARISON:  None. FINDINGS: There is no evidence of pelvic fracture or diastasis. No acute displaced fracture or dislocation of bilateral hips on frontal view. No pelvic bone lesions are seen. Surgical clips overlie the pelvis. Phleboliths noted overlying the pelvis. IMPRESSION: Negative. Electronically Signed   By: Iven Finn M.D.   On: 02/03/2021 16:34   DG Chest Port 1 View  Result Date: 02/03/2021 CLINICAL DATA:  Status post fall on blood thinners.  Trauma. EXAM: PORTABLE CHEST 1 VIEW.  Top left apex collimated off view. COMPARISON:  Chest x-ray 10/19/2019, CT chest 01/30/2021 FINDINGS: The heart and mediastinal contours are unchanged. Aortic calcification. Persistent bilateral lower lobe airspace opacities with blunting of bilateral costophrenic angles findings better evaluated on 01/30/2021. No pulmonary edema. No pleural effusion. No  pneumothorax. Acute minimally displaced posterior left fifth through seventh rib fractures. Likely acute nondisplaced left posterior third and fourth rib fractures. IMPRESSION: Acute minimally displaced posterior left 5-7th rib fractures. Likely acute nondisplaced left posterior 3rd and 4th rib fractures. No significant pneumothorax identified with top left apex collimated off view. Electronically Signed   By: Iven Finn M.D.   On: 02/03/2021 16:38   CT MAXILLOFACIAL WO CONTRAST  Result Date: 02/03/2021 CLINICAL DATA:  Facial trauma, fall on cement steps. EXAM: CT HEAD WITHOUT CONTRAST CT MAXILLOFACIAL WITHOUT CONTRAST CT CERVICAL SPINE WITHOUT  CONTRAST TECHNIQUE: Multidetector CT imaging of the head, cervical spine, and maxillofacial structures were performed using the standard protocol without intravenous contrast. Multiplanar CT image reconstructions of the cervical spine and maxillofacial structures were also generated. COMPARISON:  CT head and maxillofacial 08/09/2019 FINDINGS: CT HEAD FINDINGS Brain: No evidence of acute infarction, hemorrhage, hydrocephalus, extra-axial collection or mass lesion/mass effect. Again seen is mild diffuse atrophy. There is stable mild periventricular white matter hypodensity, likely chronic small vessel ischemic change. Small old infarct in the right frontal periventricular white matter appears new from the prior examination. Vascular: Atherosclerotic calcifications are present within the cavernous internal carotid arteries. Skull: Normal. Negative for fracture or focal lesion. Other: There is left temporal scalp soft tissue swelling. CT MAXILLOFACIAL FINDINGS Osseous: No acute fractures are identified. There are old left medial and left inferior orbital wall fractures. No dislocation. Orbits: There is preseptal left orbital soft tissue swelling. Globes are intact bilaterally. Postseptal orbital soft tissues are within normal limits. Sinuses: There is scattered  opacification of left ethmoid air cells. There is mild mucosal thickening of the left maxillary sinus. No air-fluid levels are seen. The mastoid air cells appear clear. Soft tissues: Negative. CT CERVICAL SPINE FINDINGS Alignment: There is trace anterolisthesis at C4-C5 which is favored is degenerative. Alignment is otherwise anatomic. Skull base and vertebrae: No acute fracture. No primary bone lesion or focal pathologic process. Soft tissues and spinal canal: No prevertebral fluid or swelling. No visible canal hematoma. Disc levels: There is disc space narrowing throughout the cervical spine most significant at C3-C4, C5-C6 and C6-C7 compatible with degenerative change. Facet arthropathy is seen bilaterally. There is no severe central canal or neural foraminal stenosis. Upper chest: Negative. Other: None IMPRESSION: 1. No acute intracranial process. 2. No acute fracture or traumatic subluxation of cervical spine. 3. No acute facial fracture. 4. Mild progression of chronic small vessel ischemic change in the brain. 5. Old left orbital wall fractures. Electronically Signed   By: Ronney Asters M.D.   On: 02/03/2021 17:39    Review of Systems  Constitutional:  Negative for activity change.  HENT:         Forehead pain where he struck it  Eyes:        L eye swelling  Respiratory:  Negative for shortness of breath.   Cardiovascular:  Positive for chest pain.  Gastrointestinal:  Negative for abdominal pain.  Endocrine: Negative.   Genitourinary: Negative.   Musculoskeletal: Negative.   Allergic/Immunologic: Negative.   Neurological:        Amnestic to event  Hematological:  Bruises/bleeds easily.  Psychiatric/Behavioral: Negative.     Blood pressure (!) 140/59, pulse 78, temperature 98.8 F (37.1 C), temperature source Oral, resp. rate 15, height 6' (1.829 m), weight 79.4 kg, SpO2 99 %. Physical Exam Constitutional:      General: He is not in acute distress. HENT:     Head:     Comments: Sig L  periorbital and forehead contusion, lacs sutured by EDP    Right Ear: External ear normal.     Left Ear: External ear normal.     Nose: Nose normal.     Mouth/Throat:     Mouth: Mucous membranes are moist.  Eyes:     Comments: R pupil reacts, L eye swollen shut  Cardiovascular:     Rate and Rhythm: Normal rate.  Pulmonary:     Effort: Pulmonary effort is normal.     Breath sounds: No stridor. No wheezing.  Comments: L chest wall tenderness Abdominal:     General: Abdomen is flat. There is no distension.     Palpations: Abdomen is soft.     Tenderness: There is no abdominal tenderness. There is no guarding or rebound.  Musculoskeletal:        General: No swelling or tenderness.     Cervical back: Neck supple. No tenderness.  Skin:    General: Skin is warm.     Capillary Refill: Capillary refill takes 2 to 3 seconds.  Neurological:     Mental Status: He is alert and oriented to person, place, and time.     Cranial Nerves: No cranial nerve deficit.     Comments: GCS 15, amnestic to event  Psychiatric:        Mood and Affect: Mood normal.     Assessment/Plan GLF Concussion - ST eval L facial contusion and ecchymoses, lacs closed by EDP L rib FX 3-8 with extrapleural hematoma - pulmonaru toilet, mulltimodal pain control and F/U CXR Anticoagulation with Eliquis for DVT/PE - hold for now HTN - home meds  Admit to inpatient. I spoke with his wife and son. Zenovia Jarred, MD 02/03/2021, 7:33 PM

## 2021-02-03 NOTE — Progress Notes (Signed)
Orthopedic Tech Progress Note Patient Details:  [name] 1932/01/04 [id number]  Level 2 trauma  Patient ID: [name], male   DOB: [birth year], 87 y.o.   MRN: [id number]  Janit Pagan 02/03/2021, 6:00 PM

## 2021-02-03 NOTE — ED Provider Notes (Signed)
[facility] EMERGENCY DEPARTMENT Provider Note   CSN: [id number] Arrival date & time: 02/03/21  1555     History Chief Complaint  Patient presents with   Brandon Burke is a 87 y.o. male.  Patient was coming out of the barbershop had an unwitnessed fall.  But further information implies he may have been walking up stairs trying to get back to his vehicle.  Apparently patient was unconscious.  Not able to get back on his feet.  He is on blood thinners Eliquis.  Patient does not remember falling.  Lots of blood from the left side of his face.  Patient also with a complaint of left flank pain and left chest pain.  Patient not sure if tetanus up-to-date.  Looking at the merged chart it suggest that he is probably on this for pulmonary embolism.  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  Patient arrived by EMS with cervical collar in place.  Patient was a level 2 trauma because of the head injury and being on blood thinners.      History reviewed. No pertinent past medical history.  There are no problems to display for this patient.       No family history on file.     Home Medications Prior to Admission medications   Not on File    Allergies    Patient has no allergy information on record.  Review of Systems   Review of Systems  Constitutional:  Negative for chills and fever.  HENT:  Negative for ear pain and sore throat.   Eyes:  Positive for visual disturbance. Negative for pain.  Respiratory:  Negative for cough and shortness of breath.   Cardiovascular:  Positive for chest pain. Negative for palpitations.  Gastrointestinal:  Negative for abdominal pain and vomiting.  Genitourinary:  Negative for dysuria and hematuria.  Musculoskeletal:  Negative for arthralgias and back pain.  Skin:  Positive for wound. Negative for color change and rash.  Neurological:  Negative for seizures and syncope.  Hematological:   Bruises/bleeds easily.  All other systems reviewed and are negative.  Physical Exam Updated Vital Signs BP (!) 116/55   Pulse 78   Temp 98.8 F (37.1 C) (Oral)   Resp 15   Ht 1.829 m (6')   Wt 79.4 kg   SpO2 96%   BMI 23.73 kg/m   Physical Exam Vitals and nursing note reviewed.  Constitutional:      Appearance: He is well-developed.  HENT:     Head: Normocephalic and atraumatic.     Comments: Patient without closure of the left known to have previous loss of left eye site.  Patient with a large area of abrasion to the left side of the orbit cheek and up into the forehead area.  And there is a sort of a punctate area that could be a mole that is doing some persistent bleeding.  But not pumping.  Also above the left eyebrow there is a about a 1 cm kind of stellate sort of laceration no active bleeding there. Eyes:     Conjunctiva/sclera: Conjunctivae normal.     Comments: Previous injury to the left eye.  No sight in that.  Not able to open eyelids.  Right eye appears normal.  Neck:     Comments: Neck was in cervical collar Cardiovascular:     Rate and Rhythm: Normal rate and regular rhythm.  Heart sounds: No murmur heard. Pulmonary:     Effort: Pulmonary effort is normal. No respiratory distress.     Breath sounds: Normal breath sounds.     Comments: Tender to palpation to the left lateral and posterior rib area.  Lung fields are clear bilaterally. Chest:     Chest wall: Tenderness present.  Abdominal:     General: There is no distension.     Palpations: Abdomen is soft.     Tenderness: There is no abdominal tenderness.     Comments: Abdomen nontender.  Musculoskeletal:        General: Signs of injury present.     Comments: Abrasions to both knees.  Good movement of feet cap refills intact.  Sensation intact.  Right upper extremity without any evidence of any trauma.  Left upper extremity has an abrasion skin tear to the back of the forearm.  And small 1 on the left  hand.  No obvious joint abnormalities there.  Radial pulses 2+.  Good movement at the wrist fingers elbow and shoulder.  Skin:    General: Skin is warm and dry.     Capillary Refill: Capillary refill takes less than 2 seconds.  Neurological:     General: No focal deficit present.     Mental Status: He is alert and oriented to person, place, and time.    ED Results / Procedures / Treatments   Labs (all labs ordered are listed, but only abnormal results are displayed) Labs Reviewed  COMPREHENSIVE METABOLIC PANEL - Abnormal; Notable for the following components:      Result Value   Glucose, Bld 128 (*)    Creatinine, Ser 1.30 (*)    GFR, Estimated 53 (*)    All other components within normal limits  LACTIC ACID, PLASMA - Abnormal; Notable for the following components:   Lactic Acid, Venous 2.0 (*)    All other components within normal limits  I-STAT CHEM 8, ED - Abnormal; Notable for the following components:   Creatinine, Ser 1.30 (*)    Glucose, Bld 122 (*)    Calcium, Ion 1.12 (*)    All other components within normal limits  RESP PANEL BY RT-PCR (FLU A&B, COVID) ARPGX2  CBC  ETHANOL  PROTIME-INR  URINALYSIS, ROUTINE W REFLEX MICROSCOPIC  SAMPLE TO BLOOD BANK    EKG EKG Interpretation  Date/Time:  Tuesday February 03 2021 16:18:29 EDT Ventricular Rate:  70 PR Interval:  145 QRS Duration: 98 QT Interval:  394 QTC Calculation: 426 R Axis:   17 Text Interpretation: Sinus rhythm Confirmed by Fredia Sorrow [phone]) on 02/03/2021 4:47:24 PM  Radiology CT HEAD WO CONTRAST  Result Date: 02/03/2021 CLINICAL DATA:  Facial trauma, fall on cement steps. EXAM: CT HEAD WITHOUT CONTRAST CT MAXILLOFACIAL WITHOUT CONTRAST CT CERVICAL SPINE WITHOUT CONTRAST TECHNIQUE: Multidetector CT imaging of the head, cervical spine, and maxillofacial structures were performed using the standard protocol without intravenous contrast. Multiplanar CT image reconstructions of the cervical spine and  maxillofacial structures were also generated. COMPARISON:  CT head and maxillofacial 08/09/2019 FINDINGS: CT HEAD FINDINGS Brain: No evidence of acute infarction, hemorrhage, hydrocephalus, extra-axial collection or mass lesion/mass effect. Again seen is mild diffuse atrophy. There is stable mild periventricular white matter hypodensity, likely chronic small vessel ischemic change. Small old infarct in the right frontal periventricular white matter appears new from the prior examination. Vascular: Atherosclerotic calcifications are present within the cavernous internal carotid arteries. Skull: Normal. Negative for fracture or focal lesion. Other:  There is left temporal scalp soft tissue swelling. CT MAXILLOFACIAL FINDINGS Osseous: No acute fractures are identified. There are old left medial and left inferior orbital wall fractures. No dislocation. Orbits: There is preseptal left orbital soft tissue swelling. Globes are intact bilaterally. Postseptal orbital soft tissues are within normal limits. Sinuses: There is scattered opacification of left ethmoid air cells. There is mild mucosal thickening of the left maxillary sinus. No air-fluid levels are seen. The mastoid air cells appear clear. Soft tissues: Negative. CT CERVICAL SPINE FINDINGS Alignment: There is trace anterolisthesis at C4-C5 which is favored is degenerative. Alignment is otherwise anatomic. Skull base and vertebrae: No acute fracture. No primary bone lesion or focal pathologic process. Soft tissues and spinal canal: No prevertebral fluid or swelling. No visible canal hematoma. Disc levels: There is disc space narrowing throughout the cervical spine most significant at C3-C4, C5-C6 and C6-C7 compatible with degenerative change. Facet arthropathy is seen bilaterally. There is no severe central canal or neural foraminal stenosis. Upper chest: Negative. Other: None IMPRESSION: 1. No acute intracranial process. 2. No acute fracture or traumatic subluxation  of cervical spine. 3. No acute facial fracture. 4. Mild progression of chronic small vessel ischemic change in the brain. 5. Old left orbital wall fractures. Electronically Signed   By: Ronney Asters M.D.   On: 02/03/2021 17:39   CT CHEST W CONTRAST  Result Date: 02/03/2021 CLINICAL DATA:  Abdominal trauma, fell on cement steps. EXAM: CT CHEST, ABDOMEN, AND PELVIS WITH CONTRAST TECHNIQUE: Multidetector CT imaging of the chest, abdomen and pelvis was performed following the standard protocol during bolus administration of intravenous contrast. CONTRAST:  167mL OMNIPAQUE IOHEXOL 300 MG/ML  SOLN COMPARISON:  None. CT chest 01/30/2021. FINDINGS: CT CHEST FINDINGS Cardiovascular: No significant vascular findings. Normal heart size. No pericardial effusion. There are atherosclerotic calcifications of the aorta and coronary arteries Mediastinum/Nodes: No enlarged mediastinal, hilar, or axillary lymph nodes. Thyroid gland, trachea, and esophagus demonstrate no significant findings. Right lower pericardial cyst appears unchanged from prior. Lungs/Pleura: There is some patchy airspace and ground-glass opacity new left lower lobe and minimal patchy ground-glass opacity in the right lower lobe. Elongated nodular density in the right middle lobe measures 3.1 by 1.0 cm and has slightly increased in size when compared to the prior examination. There is a small amount of pleural fluid adjacent to rib fractures posteriorly in the left hemithorax. There is no evidence for pneumothorax. Musculoskeletal: There are nondisplaced posterior left third fourth and eighth rib fractures. There are displaced left fifth, sixth and seventh posterior rib fractures. CT ABDOMEN PELVIS FINDINGS Hepatobiliary: There is a cyst in the inferior right lobe of the liver measuring 3.2 cm. There are additional rounded hypodensities in the liver which are too small to characterize, also likely cysts. Gallbladder and bile ducts are within normal limits.  Pancreas: Unremarkable. No pancreatic ductal dilatation or surrounding inflammatory changes. Spleen: No splenic injury or perisplenic hematoma. Adrenals/Urinary Tract: There are cortical hypodensities in the right kidney which are too small to characterize, most likely cysts. The kidneys and adrenal glands are otherwise within normal limits. Bladder is distended and within normal limits. Stomach/Bowel: Stomach is within normal limits. Appendix appears normal. No evidence of bowel wall thickening, distention, or inflammatory changes. There is a large amount of stool throughout the colon. Vascular/Lymphatic: Aortic atherosclerosis. No enlarged abdominal or pelvic lymph nodes. Reproductive: Status post prostatectomy. Other: There is a small fat containing umbilical hernia. There is no ascites. There is no body wall hematoma.  Musculoskeletal: No acute or significant osseous findings. IMPRESSION: 1. Acute posterior left third through eighth rib fractures. Adjacent pleural thickening/hematoma. No pneumothorax. 2. Left lower lobe patchy airspace disease worrisome for infection. Pulmonary contusion not excluded. 3. Elongated lobulated density in the right middle lobe has increased in size. This is indeterminate. Neoplasm cannot be excluded. Consider follow-up PET-CT or tissue sampling. 4. No acute localizing process in the abdomen or pelvis. Electronically Signed   By: Ronney Asters M.D.   On: 02/03/2021 17:54   CT CERVICAL SPINE WO CONTRAST  Result Date: 02/03/2021 CLINICAL DATA:  Facial trauma, fall on cement steps. EXAM: CT HEAD WITHOUT CONTRAST CT MAXILLOFACIAL WITHOUT CONTRAST CT CERVICAL SPINE WITHOUT CONTRAST TECHNIQUE: Multidetector CT imaging of the head, cervical spine, and maxillofacial structures were performed using the standard protocol without intravenous contrast. Multiplanar CT image reconstructions of the cervical spine and maxillofacial structures were also generated. COMPARISON:  CT head and  maxillofacial 08/09/2019 FINDINGS: CT HEAD FINDINGS Brain: No evidence of acute infarction, hemorrhage, hydrocephalus, extra-axial collection or mass lesion/mass effect. Again seen is mild diffuse atrophy. There is stable mild periventricular white matter hypodensity, likely chronic small vessel ischemic change. Small old infarct in the right frontal periventricular white matter appears new from the prior examination. Vascular: Atherosclerotic calcifications are present within the cavernous internal carotid arteries. Skull: Normal. Negative for fracture or focal lesion. Other: There is left temporal scalp soft tissue swelling. CT MAXILLOFACIAL FINDINGS Osseous: No acute fractures are identified. There are old left medial and left inferior orbital wall fractures. No dislocation. Orbits: There is preseptal left orbital soft tissue swelling. Globes are intact bilaterally. Postseptal orbital soft tissues are within normal limits. Sinuses: There is scattered opacification of left ethmoid air cells. There is mild mucosal thickening of the left maxillary sinus. No air-fluid levels are seen. The mastoid air cells appear clear. Soft tissues: Negative. CT CERVICAL SPINE FINDINGS Alignment: There is trace anterolisthesis at C4-C5 which is favored is degenerative. Alignment is otherwise anatomic. Skull base and vertebrae: No acute fracture. No primary bone lesion or focal pathologic process. Soft tissues and spinal canal: No prevertebral fluid or swelling. No visible canal hematoma. Disc levels: There is disc space narrowing throughout the cervical spine most significant at C3-C4, C5-C6 and C6-C7 compatible with degenerative change. Facet arthropathy is seen bilaterally. There is no severe central canal or neural foraminal stenosis. Upper chest: Negative. Other: None IMPRESSION: 1. No acute intracranial process. 2. No acute fracture or traumatic subluxation of cervical spine. 3. No acute facial fracture. 4. Mild progression of  chronic small vessel ischemic change in the brain. 5. Old left orbital wall fractures. Electronically Signed   By: Ronney Asters M.D.   On: 02/03/2021 17:39   CT ABDOMEN PELVIS W CONTRAST  Result Date: 02/03/2021 CLINICAL DATA:  Abdominal trauma, fell on cement steps. EXAM: CT CHEST, ABDOMEN, AND PELVIS WITH CONTRAST TECHNIQUE: Multidetector CT imaging of the chest, abdomen and pelvis was performed following the standard protocol during bolus administration of intravenous contrast. CONTRAST:  135mL OMNIPAQUE IOHEXOL 300 MG/ML  SOLN COMPARISON:  None. CT chest 01/30/2021. FINDINGS: CT CHEST FINDINGS Cardiovascular: No significant vascular findings. Normal heart size. No pericardial effusion. There are atherosclerotic calcifications of the aorta and coronary arteries Mediastinum/Nodes: No enlarged mediastinal, hilar, or axillary lymph nodes. Thyroid gland, trachea, and esophagus demonstrate no significant findings. Right lower pericardial cyst appears unchanged from prior. Lungs/Pleura: There is some patchy airspace and ground-glass opacity new left lower lobe and minimal patchy ground-glass  opacity in the right lower lobe. Elongated nodular density in the right middle lobe measures 3.1 by 1.0 cm and has slightly increased in size when compared to the prior examination. There is a small amount of pleural fluid adjacent to rib fractures posteriorly in the left hemithorax. There is no evidence for pneumothorax. Musculoskeletal: There are nondisplaced posterior left third fourth and eighth rib fractures. There are displaced left fifth, sixth and seventh posterior rib fractures. CT ABDOMEN PELVIS FINDINGS Hepatobiliary: There is a cyst in the inferior right lobe of the liver measuring 3.2 cm. There are additional rounded hypodensities in the liver which are too small to characterize, also likely cysts. Gallbladder and bile ducts are within normal limits. Pancreas: Unremarkable. No pancreatic ductal dilatation or  surrounding inflammatory changes. Spleen: No splenic injury or perisplenic hematoma. Adrenals/Urinary Tract: There are cortical hypodensities in the right kidney which are too small to characterize, most likely cysts. The kidneys and adrenal glands are otherwise within normal limits. Bladder is distended and within normal limits. Stomach/Bowel: Stomach is within normal limits. Appendix appears normal. No evidence of bowel wall thickening, distention, or inflammatory changes. There is a large amount of stool throughout the colon. Vascular/Lymphatic: Aortic atherosclerosis. No enlarged abdominal or pelvic lymph nodes. Reproductive: Status post prostatectomy. Other: There is a small fat containing umbilical hernia. There is no ascites. There is no body wall hematoma. Musculoskeletal: No acute or significant osseous findings. IMPRESSION: 1. Acute posterior left third through eighth rib fractures. Adjacent pleural thickening/hematoma. No pneumothorax. 2. Left lower lobe patchy airspace disease worrisome for infection. Pulmonary contusion not excluded. 3. Elongated lobulated density in the right middle lobe has increased in size. This is indeterminate. Neoplasm cannot be excluded. Consider follow-up PET-CT or tissue sampling. 4. No acute localizing process in the abdomen or pelvis. Electronically Signed   By: Ronney Asters M.D.   On: 02/03/2021 17:54   DG Pelvis Portable  Result Date: 02/03/2021 CLINICAL DATA:  Status post fall on blood thinners.  Trauma. EXAM: PORTABLE PELVIS 1-2 VIEWS COMPARISON:  None. FINDINGS: There is no evidence of pelvic fracture or diastasis. No acute displaced fracture or dislocation of bilateral hips on frontal view. No pelvic bone lesions are seen. Surgical clips overlie the pelvis. Phleboliths noted overlying the pelvis. IMPRESSION: Negative. Electronically Signed   By: Iven Finn M.D.   On: 02/03/2021 16:34   DG Chest Port 1 View  Result Date: 02/03/2021 CLINICAL DATA:  Status  post fall on blood thinners.  Trauma. EXAM: PORTABLE CHEST 1 VIEW.  Top left apex collimated off view. COMPARISON:  Chest x-ray 10/19/2019, CT chest 01/30/2021 FINDINGS: The heart and mediastinal contours are unchanged. Aortic calcification. Persistent bilateral lower lobe airspace opacities with blunting of bilateral costophrenic angles findings better evaluated on 01/30/2021. No pulmonary edema. No pleural effusion. No pneumothorax. Acute minimally displaced posterior left fifth through seventh rib fractures. Likely acute nondisplaced left posterior third and fourth rib fractures. IMPRESSION: Acute minimally displaced posterior left 5-7th rib fractures. Likely acute nondisplaced left posterior 3rd and 4th rib fractures. No significant pneumothorax identified with top left apex collimated off view. Electronically Signed   By: Iven Finn M.D.   On: 02/03/2021 16:38   CT MAXILLOFACIAL WO CONTRAST  Result Date: 02/03/2021 CLINICAL DATA:  Facial trauma, fall on cement steps. EXAM: CT HEAD WITHOUT CONTRAST CT MAXILLOFACIAL WITHOUT CONTRAST CT CERVICAL SPINE WITHOUT CONTRAST TECHNIQUE: Multidetector CT imaging of the head, cervical spine, and maxillofacial structures were performed using the standard protocol without  intravenous contrast. Multiplanar CT image reconstructions of the cervical spine and maxillofacial structures were also generated. COMPARISON:  CT head and maxillofacial 08/09/2019 FINDINGS: CT HEAD FINDINGS Brain: No evidence of acute infarction, hemorrhage, hydrocephalus, extra-axial collection or mass lesion/mass effect. Again seen is mild diffuse atrophy. There is stable mild periventricular white matter hypodensity, likely chronic small vessel ischemic change. Small old infarct in the right frontal periventricular white matter appears new from the prior examination. Vascular: Atherosclerotic calcifications are present within the cavernous internal carotid arteries. Skull: Normal. Negative for  fracture or focal lesion. Other: There is left temporal scalp soft tissue swelling. CT MAXILLOFACIAL FINDINGS Osseous: No acute fractures are identified. There are old left medial and left inferior orbital wall fractures. No dislocation. Orbits: There is preseptal left orbital soft tissue swelling. Globes are intact bilaterally. Postseptal orbital soft tissues are within normal limits. Sinuses: There is scattered opacification of left ethmoid air cells. There is mild mucosal thickening of the left maxillary sinus. No air-fluid levels are seen. The mastoid air cells appear clear. Soft tissues: Negative. CT CERVICAL SPINE FINDINGS Alignment: There is trace anterolisthesis at C4-C5 which is favored is degenerative. Alignment is otherwise anatomic. Skull base and vertebrae: No acute fracture. No primary bone lesion or focal pathologic process. Soft tissues and spinal canal: No prevertebral fluid or swelling. No visible canal hematoma. Disc levels: There is disc space narrowing throughout the cervical spine most significant at C3-C4, C5-C6 and C6-C7 compatible with degenerative change. Facet arthropathy is seen bilaterally. There is no severe central canal or neural foraminal stenosis. Upper chest: Negative. Other: None IMPRESSION: 1. No acute intracranial process. 2. No acute fracture or traumatic subluxation of cervical spine. 3. No acute facial fracture. 4. Mild progression of chronic small vessel ischemic change in the brain. 5. Old left orbital wall fractures. Electronically Signed   By: Ronney Asters M.D.   On: 02/03/2021 17:39    Procedures Procedures   Medications Ordered in ED Medications  lidocaine-EPINEPHrine (XYLOCAINE W/EPI) 2 %-1:200000 (PF) injection 20 mL (has no administration in time range)  Tdap (BOOSTRIX) injection 0.5 mL (0.5 mLs Intramuscular Given 02/03/21 1816)  iohexol (OMNIPAQUE) 300 MG/ML solution 100 mL (100 mLs Intravenous Contrast Given 02/03/21 1711)  fentaNYL (SUBLIMAZE) injection  50 mcg (50 mcg Intravenous Given 02/03/21 1825)    ED Course  I have reviewed the triage vital signs and the nursing notes.  Pertinent labs & imaging results that were available during my care of the patient were reviewed by me and considered in my medical decision making (see chart for details).    MDM Rules/Calculators/A&P                         CRITICAL CARE Performed by: Fredia Sorrow Total critical care time: 45 minutes Critical care time was exclusive of separately billable procedures and treating other patients. Critical care was necessary to treat or prevent imminent or life-threatening deterioration. Critical care was time spent personally by me on the following activities: development of treatment plan with patient and/or surrogate as well as nursing, discussions with consultants, evaluation of patient's response to treatment, examination of patient, obtaining history from patient or surrogate, ordering and performing treatments and interventions, ordering and review of laboratory studies, ordering and review of radiographic studies, pulse oximetry and re-evaluation of patient's condition.  Left forehead wound repair will be done by my physician assistant.  Patient CT head and neck and face without any acute findings.  Evidence of old orbital fractures which probably is something to do from the previous injury that led to the loss of sight.  CT chest definitely shows 5 rib fractures 3 through 8.  With probable pulmonary contusion.  CT abdomen pelvis without any acute findings.  Prior to that portable chest x-ray and portable pelvis without any acute findings.  Discussed with trauma surgery Dr. Grandville Silos.  He will admit the patient for observation. Final Clinical Impression(s) / ED Diagnoses Final diagnoses:  Trauma  Fall, initial encounter  Injury of head, initial encounter  LOC (loss of consciousness) ([place])  Closed fracture of multiple ribs of left side, initial encounter   Contusion of left lung, initial encounter  Mass of right lung  Abrasion  Forehead laceration, initial encounter    Rx / DC Orders ED Discharge Orders     None        Fredia Sorrow, MD 02/03/21 1851

## 2021-02-03 NOTE — ED Provider Notes (Signed)
Patient with L temporal wound, approximately 1 cm, with brisk, likely arteriolar bleeding.  Wound was anesthetized with 2% lidocaine with epinephrine, approximately 4 cc.  Bleeding was able to be stopped with pressure but continued to rebleed after pressure remote.  Blood vessel was unable to be directly visualized and cautery was not attempted.  Quick clot gauze was cut into thin strips and wound was packed with good control bleeding.  This was then covered with a dressing.  No bleedthrough immediately after.  Eyebrow laceration then repaired as below.  Marland Kitchen.Laceration Repair  Date/Time: 02/03/2021 7:16 PM Performed by: Carlisle Cater, PA-C Authorized by: Carlisle Cater, PA-C   Consent:    Consent obtained:  Verbal   Consent given by:  Patient   Risks discussed:  Pain   Alternatives discussed:  No treatment Universal protocol:    Patient identity confirmed:  Verbally with patient and arm band Anesthesia:    Anesthesia method:  Local infiltration   Local anesthetic:  Lidocaine 2% WITH epi Laceration details:    Location:  Face   Face location:  L eyebrow   Length (cm):  1 Pre-procedure details:    Preparation:  Patient was prepped and draped in usual sterile fashion Exploration:    Hemostasis achieved with:  Direct pressure and epinephrine   Wound exploration: wound explored through full range of motion and entire depth of wound visualized   Treatment:    Area cleansed with:  Saline   Amount of cleaning:  Standard Skin repair:    Repair method:  Sutures   Suture size:  5-0   Suture material:  Nylon   Suture technique:  Simple interrupted   Number of sutures:  2 Approximation:    Approximation:  Close Repair type:    Repair type:  Simple Post-procedure details:    Dressing:  Open (no dressing)   Procedure completion:  Tolerated well, no immediate complications    Carlisle Cater, PA-C 02/03/21 1918    Fredia Sorrow, MD 02/10/21 1530

## 2021-02-04 ENCOUNTER — Inpatient Hospital Stay (HOSPITAL_COMMUNITY): Payer: PPO

## 2021-02-04 LAB — CBC
HCT: 41 % (ref 39.0–52.0)
Hemoglobin: 14.1 g/dL (ref 13.0–17.0)
MCH: 33.9 pg (ref 26.0–34.0)
MCHC: 34.4 g/dL (ref 30.0–36.0)
MCV: 98.6 fL (ref 80.0–100.0)
Platelets: 148 10*3/uL — ABNORMAL LOW (ref 150–400)
RBC: 4.16 MIL/uL — ABNORMAL LOW (ref 4.22–5.81)
RDW: 12.6 % (ref 11.5–15.5)
WBC: 10.3 10*3/uL (ref 4.0–10.5)
nRBC: 0 % (ref 0.0–0.2)

## 2021-02-04 LAB — BASIC METABOLIC PANEL
Anion gap: 10 (ref 5–15)
BUN: 19 mg/dL (ref 8–23)
CO2: 23 mmol/L (ref 22–32)
Calcium: 9.3 mg/dL (ref 8.9–10.3)
Chloride: 103 mmol/L (ref 98–111)
Creatinine, Ser: 1.19 mg/dL (ref 0.61–1.24)
GFR, Estimated: 59 mL/min — ABNORMAL LOW (ref >60)
Glucose, Bld: 131 mg/dL — ABNORMAL HIGH (ref 70–99)
Potassium: 4.5 mmol/L (ref 3.5–5.1)
Sodium: 136 mmol/L (ref 135–145)

## 2021-02-04 MED ORDER — FLUTICASONE PROPIONATE 50 MCG/ACT NA SUSP
1.0000 | Freq: Every day | NASAL | Status: DC
  Administered 2021-02-05 – 2021-02-11 (×7): 1 via NASAL
  Filled 2021-02-04: qty 16

## 2021-02-04 MED ORDER — DORZOLAMIDE HCL-TIMOLOL MAL 2-0.5 % OP SOLN
1.0000 [drp] | Freq: Two times a day (BID) | OPHTHALMIC | Status: DC
  Administered 2021-02-04 – 2021-02-11 (×14): 1 [drp] via OPHTHALMIC
  Filled 2021-02-04 (×2): qty 10

## 2021-02-04 MED ORDER — SODIUM CHLORIDE 0.9% FLUSH: 3.0000 mL | INTRAVENOUS | Status: DC | PRN

## 2021-02-04 MED ORDER — ONDANSETRON HCL 4 MG/2ML IJ SOLN: 4.0000 mg | Freq: Four times a day (QID) | INTRAMUSCULAR | Status: DC | PRN

## 2021-02-04 MED ORDER — ACETAMINOPHEN 325 MG PO TABS: 650.0000 mg | ORAL_TABLET | ORAL | Status: DC | PRN

## 2021-02-04 MED ORDER — METHOCARBAMOL 500 MG PO TABS
500.0000 mg | ORAL_TABLET | Freq: Three times a day (TID) | ORAL | Status: DC | PRN
  Administered 2021-02-06 – 2021-02-10 (×5): 500 mg via ORAL
  Filled 2021-02-04 (×5): qty 1

## 2021-02-04 MED ORDER — ONDANSETRON 4 MG PO TBDP: 4.0000 mg | ORAL_TABLET | Freq: Four times a day (QID) | ORAL | Status: DC | PRN

## 2021-02-04 MED ORDER — LIDOCAINE 5 % EX PTCH
1.0000 | MEDICATED_PATCH | CUTANEOUS | Status: DC
  Administered 2021-02-04 – 2021-02-11 (×8): 1 via TRANSDERMAL
  Filled 2021-02-04 (×8): qty 1

## 2021-02-04 MED ORDER — SODIUM CHLORIDE 0.9% FLUSH
3.0000 mL | Freq: Two times a day (BID) | INTRAVENOUS | Status: DC
  Administered 2021-02-04 – 2021-02-11 (×15): 3 mL via INTRAVENOUS

## 2021-02-04 MED ORDER — SODIUM CHLORIDE 0.9 % IV SOLN: 250.0000 mL | INTRAVENOUS | Status: DC | PRN

## 2021-02-04 MED ORDER — CALCIUM CARBONATE ANTACID 500 MG PO CHEW: 1.0000 | CHEWABLE_TABLET | ORAL | Status: DC | PRN

## 2021-02-04 MED ORDER — HYDRALAZINE HCL 10 MG PO TABS: 10.0000 mg | ORAL_TABLET | Freq: Four times a day (QID) | ORAL | Status: DC | PRN

## 2021-02-04 MED ORDER — ACETAMINOPHEN 325 MG PO TABS
650.0000 mg | ORAL_TABLET | Freq: Four times a day (QID) | ORAL | Status: DC
  Administered 2021-02-04 – 2021-02-11 (×28): 650 mg via ORAL
  Filled 2021-02-04 (×27): qty 2

## 2021-02-04 MED ORDER — BACITRACIN ZINC 500 UNIT/GM EX OINT
TOPICAL_OINTMENT | Freq: Two times a day (BID) | CUTANEOUS | Status: DC
  Administered 2021-02-05 – 2021-02-10 (×3): 1 via TOPICAL
  Filled 2021-02-04: qty 0.9
  Filled 2021-02-04 (×4): qty 28.35

## 2021-02-04 MED ORDER — TRAMADOL HCL 50 MG PO TABS
50.0000 mg | ORAL_TABLET | Freq: Four times a day (QID) | ORAL | Status: DC | PRN
  Administered 2021-02-04 – 2021-02-10 (×7): 50 mg via ORAL
  Filled 2021-02-04 (×7): qty 1

## 2021-02-04 MED ORDER — PRAVASTATIN SODIUM 40 MG PO TABS
40.0000 mg | ORAL_TABLET | Freq: Every day | ORAL | Status: DC
  Administered 2021-02-04 – 2021-02-10 (×7): 40 mg via ORAL
  Filled 2021-02-04 (×7): qty 1

## 2021-02-04 MED ORDER — MORPHINE SULFATE (PF) 2 MG/ML IV SOLN
2.0000 mg | INTRAVENOUS | Status: DC | PRN
  Filled 2021-02-04: qty 1

## 2021-02-04 MED ORDER — DOCUSATE SODIUM 100 MG PO CAPS
100.0000 mg | ORAL_CAPSULE | Freq: Two times a day (BID) | ORAL | Status: DC
  Administered 2021-02-04 – 2021-02-11 (×15): 100 mg via ORAL
  Filled 2021-02-04 (×15): qty 1

## 2021-02-04 MED ORDER — LEVOTHYROXINE SODIUM 25 MCG PO TABS
25.0000 ug | ORAL_TABLET | Freq: Two times a day (BID) | ORAL | Status: DC
  Administered 2021-02-04 – 2021-02-11 (×15): 25 ug via ORAL
  Filled 2021-02-04 (×15): qty 1

## 2021-02-04 MED ORDER — APIXABAN 2.5 MG PO TABS
2.5000 mg | ORAL_TABLET | Freq: Two times a day (BID) | ORAL | Status: DC
  Administered 2021-02-04 – 2021-02-11 (×15): 2.5 mg via ORAL
  Filled 2021-02-04 (×17): qty 1

## 2021-02-04 MED ORDER — ATENOLOL 25 MG PO TABS
50.0000 mg | ORAL_TABLET | Freq: Every day | ORAL | Status: DC
  Administered 2021-02-04 – 2021-02-11 (×8): 50 mg via ORAL
  Filled 2021-02-04 (×8): qty 2

## 2021-02-04 MED ORDER — LATANOPROST 0.005 % OP SOLN
1.0000 [drp] | Freq: Every day | OPHTHALMIC | Status: DC
  Administered 2021-02-04 – 2021-02-10 (×7): 1 [drp] via OPHTHALMIC
  Filled 2021-02-04: qty 2.5

## 2021-02-04 MED ORDER — TAMSULOSIN HCL 0.4 MG PO CAPS
0.4000 mg | ORAL_CAPSULE | Freq: Every day | ORAL | Status: DC
  Administered 2021-02-04 – 2021-02-10 (×7): 0.4 mg via ORAL
  Filled 2021-02-04 (×7): qty 1

## 2021-02-04 MED ORDER — POLYETHYLENE GLYCOL 3350 17 G PO PACK
17.0000 g | PACK | Freq: Every day | ORAL | Status: DC
Start: 2021-02-04 — End: 2021-02-11
  Administered 2021-02-04 – 2021-02-11 (×8): 17 g via ORAL
  Filled 2021-02-04 (×8): qty 1

## 2021-02-04 MED ORDER — MORPHINE SULFATE (PF) 4 MG/ML IV SOLN
4.0000 mg | INTRAVENOUS | Status: DC | PRN
Start: 2021-02-04 — End: 2021-02-04
  Administered 2021-02-04: 4 mg via INTRAVENOUS
  Filled 2021-02-04: qty 1

## 2021-02-04 MED ORDER — POLYETHYLENE GLYCOL 3350 17 G PO PACK
17.0000 g | PACK | Freq: Every day | ORAL | Status: DC | PRN
Start: 2021-02-04 — End: 2021-02-04

## 2021-02-04 MED ORDER — TRAMADOL HCL 50 MG PO TABS
50.0000 mg | ORAL_TABLET | Freq: Four times a day (QID) | ORAL | Status: DC | PRN
Start: 2021-02-04 — End: 2021-02-04

## 2021-02-04 MED ORDER — DM-GUAIFENESIN ER 30-600 MG PO TB12
1.0000 | ORAL_TABLET | Freq: Two times a day (BID) | ORAL | Status: DC
  Administered 2021-02-04 – 2021-02-11 (×15): 1 via ORAL
  Filled 2021-02-04 (×16): qty 1

## 2021-02-04 NOTE — ED Notes (Signed)
Transported to radiology 

## 2021-02-04 NOTE — ED Notes (Signed)
Lunch ordered 

## 2021-02-04 NOTE — ED Notes (Signed)
Hematoma and bruising noted to lt eye with dressing above lt eye. Pt placed in hospital gown, sheets changed, pt adjusted in bed.

## 2021-02-04 NOTE — ED Notes (Signed)
Placed Breakfast orders 

## 2021-02-04 NOTE — ED Notes (Signed)
Paged Dr. Grandville Silos for RN

## 2021-02-04 NOTE — ED Notes (Signed)
Spoke with admit provider received verbal orders for in and out cath

## 2021-02-04 NOTE — Progress Notes (Signed)
Patient ID: Brandon Burke, male   DOB: Mar 14, 1932, 87 y.o.   MRN: [id number]  [facility] Surgery Progress Note     Subjective: CC-  Wife at bedside. Concerned that patient is confused, states that he "doesn't remember things like he used to" but at baseline he is A&Ox3. Currently oriented to self, but does not remember falling and does not know where he is or the year. Having some pain from rib fractures, pain medication helps. Denies SOB. Denies abdominal pain. Denies noting any new injuries. Required I&O cath x1 earlier this morning.  Objective: Vital signs in last 24 hours: Temp:  [98.8 F (37.1 C)] 98.8 F (37.1 C) (10/04 1612) Pulse Rate:  [71-84] 78 (10/05 0550) Resp:  [15-24] 16 (10/05 0550) BP: (116-193)/(55-95) 135/70 (10/05 0550) SpO2:  [95 %-99 %] 98 % (10/05 0550) Weight:  [79.4 kg] 79.4 kg (10/04 1611)    Intake/Output from previous day: 10/04 0701 - 10/05 0700 In: -  Out: 450 [Urine:450] Intake/Output this shift: No intake/output data recorded.  PE: Gen:  Alert, NAD, pleasant HEENT: R EOM's intact. L eye swollen shut with surrounding ecchymosis. Left supraorbital abrasion and laceration, sutures intact, no active bleeding Card:  RRR, no M/G/R heard, 2+ DP pulses Pulm:  CTAB, no W/R/R, rate and effort normal on RA, pulling 700 on IS Abd: Soft, NT/ND, +BS, no HSM, umbilical hernia small and reducible Ext:  no BUE/BLE edema, calves soft and nontender Psych: Alert, oriented only to self, does not know where he is, states that the year is 1995 Skin: no rashes noted, warm and dry  Lab Results:  Recent Labs    02/03/21 1608 02/03/21 1619 02/04/21 0326  WBC 9.8  --  10.3  HGB 14.6 14.3 14.1  HCT 44.6 42.0 41.0  PLT 151  --  148*   BMET Recent Labs    02/03/21 1608 02/03/21 1619 02/04/21 0326  NA 138 140 136  K 4.2 4.2 4.5  CL 107 107 103  CO2 25  --  23  GLUCOSE 128* 122* 131*  BUN 21 21 19   CREATININE 1.30* 1.30* 1.19  CALCIUM 8.9  --   9.3   PT/INR Recent Labs    02/03/21 1608  LABPROT 14.4  INR 1.1   CMP     Component Value Date/Time   NA 136 02/04/2021 0326   K 4.5 02/04/2021 0326   CL 103 02/04/2021 0326   CO2 23 02/04/2021 0326   GLUCOSE 131 (H) 02/04/2021 0326   BUN 19 02/04/2021 0326   CREATININE 1.19 02/04/2021 0326   CALCIUM 9.3 02/04/2021 0326   PROT 6.6 02/03/2021 1608   ALBUMIN 3.5 02/03/2021 1608   AST 26 02/03/2021 1608   ALT 28 02/03/2021 1608   ALKPHOS 70 02/03/2021 1608   BILITOT 0.6 02/03/2021 1608   GFRNONAA 59 (L) 02/04/2021 0326   Lipase  No results found for: LIPASE     Studies/Results: CT HEAD WO CONTRAST  Result Date: 02/03/2021 CLINICAL DATA:  Facial trauma, fall on cement steps. EXAM: CT HEAD WITHOUT CONTRAST CT MAXILLOFACIAL WITHOUT CONTRAST CT CERVICAL SPINE WITHOUT CONTRAST TECHNIQUE: Multidetector CT imaging of the head, cervical spine, and maxillofacial structures were performed using the standard protocol without intravenous contrast. Multiplanar CT image reconstructions of the cervical spine and maxillofacial structures were also generated. COMPARISON:  CT head and maxillofacial 08/09/2019 FINDINGS: CT HEAD FINDINGS Brain: No evidence of acute infarction, hemorrhage, hydrocephalus, extra-axial collection or mass lesion/mass effect. Again seen is mild  diffuse atrophy. There is stable mild periventricular white matter hypodensity, likely chronic small vessel ischemic change. Small old infarct in the right frontal periventricular white matter appears new from the prior examination. Vascular: Atherosclerotic calcifications are present within the cavernous internal carotid arteries. Skull: Normal. Negative for fracture or focal lesion. Other: There is left temporal scalp soft tissue swelling. CT MAXILLOFACIAL FINDINGS Osseous: No acute fractures are identified. There are old left medial and left inferior orbital wall fractures. No dislocation. Orbits: There is preseptal left orbital  soft tissue swelling. Globes are intact bilaterally. Postseptal orbital soft tissues are within normal limits. Sinuses: There is scattered opacification of left ethmoid air cells. There is mild mucosal thickening of the left maxillary sinus. No air-fluid levels are seen. The mastoid air cells appear clear. Soft tissues: Negative. CT CERVICAL SPINE FINDINGS Alignment: There is trace anterolisthesis at C4-C5 which is favored is degenerative. Alignment is otherwise anatomic. Skull base and vertebrae: No acute fracture. No primary bone lesion or focal pathologic process. Soft tissues and spinal canal: No prevertebral fluid or swelling. No visible canal hematoma. Disc levels: There is disc space narrowing throughout the cervical spine most significant at C3-C4, C5-C6 and C6-C7 compatible with degenerative change. Facet arthropathy is seen bilaterally. There is no severe central canal or neural foraminal stenosis. Upper chest: Negative. Other: None IMPRESSION: 1. No acute intracranial process. 2. No acute fracture or traumatic subluxation of cervical spine. 3. No acute facial fracture. 4. Mild progression of chronic small vessel ischemic change in the brain. 5. Old left orbital wall fractures. Electronically Signed   By: Ronney Asters M.D.   On: 02/03/2021 17:39   CT CHEST W CONTRAST  Result Date: 02/03/2021 CLINICAL DATA:  Abdominal trauma, fell on cement steps. EXAM: CT CHEST, ABDOMEN, AND PELVIS WITH CONTRAST TECHNIQUE: Multidetector CT imaging of the chest, abdomen and pelvis was performed following the standard protocol during bolus administration of intravenous contrast. CONTRAST:  17mL OMNIPAQUE IOHEXOL 300 MG/ML  SOLN COMPARISON:  None. CT chest 01/30/2021. FINDINGS: CT CHEST FINDINGS Cardiovascular: No significant vascular findings. Normal heart size. No pericardial effusion. There are atherosclerotic calcifications of the aorta and coronary arteries Mediastinum/Nodes: No enlarged mediastinal, hilar, or  axillary lymph nodes. Thyroid gland, trachea, and esophagus demonstrate no significant findings. Right lower pericardial cyst appears unchanged from prior. Lungs/Pleura: There is some patchy airspace and ground-glass opacity new left lower lobe and minimal patchy ground-glass opacity in the right lower lobe. Elongated nodular density in the right middle lobe measures 3.1 by 1.0 cm and has slightly increased in size when compared to the prior examination. There is a small amount of pleural fluid adjacent to rib fractures posteriorly in the left hemithorax. There is no evidence for pneumothorax. Musculoskeletal: There are nondisplaced posterior left third fourth and eighth rib fractures. There are displaced left fifth, sixth and seventh posterior rib fractures. CT ABDOMEN PELVIS FINDINGS Hepatobiliary: There is a cyst in the inferior right lobe of the liver measuring 3.2 cm. There are additional rounded hypodensities in the liver which are too small to characterize, also likely cysts. Gallbladder and bile ducts are within normal limits. Pancreas: Unremarkable. No pancreatic ductal dilatation or surrounding inflammatory changes. Spleen: No splenic injury or perisplenic hematoma. Adrenals/Urinary Tract: There are cortical hypodensities in the right kidney which are too small to characterize, most likely cysts. The kidneys and adrenal glands are otherwise within normal limits. Bladder is distended and within normal limits. Stomach/Bowel: Stomach is within normal limits. Appendix appears normal. No  evidence of bowel wall thickening, distention, or inflammatory changes. There is a large amount of stool throughout the colon. Vascular/Lymphatic: Aortic atherosclerosis. No enlarged abdominal or pelvic lymph nodes. Reproductive: Status post prostatectomy. Other: There is a small fat containing umbilical hernia. There is no ascites. There is no body wall hematoma. Musculoskeletal: No acute or significant osseous findings.  IMPRESSION: 1. Acute posterior left third through eighth rib fractures. Adjacent pleural thickening/hematoma. No pneumothorax. 2. Left lower lobe patchy airspace disease worrisome for infection. Pulmonary contusion not excluded. 3. Elongated lobulated density in the right middle lobe has increased in size. This is indeterminate. Neoplasm cannot be excluded. Consider follow-up PET-CT or tissue sampling. 4. No acute localizing process in the abdomen or pelvis. Electronically Signed   By: Ronney Asters M.D.   On: 02/03/2021 17:54   CT CERVICAL SPINE WO CONTRAST  Result Date: 02/03/2021 CLINICAL DATA:  Facial trauma, fall on cement steps. EXAM: CT HEAD WITHOUT CONTRAST CT MAXILLOFACIAL WITHOUT CONTRAST CT CERVICAL SPINE WITHOUT CONTRAST TECHNIQUE: Multidetector CT imaging of the head, cervical spine, and maxillofacial structures were performed using the standard protocol without intravenous contrast. Multiplanar CT image reconstructions of the cervical spine and maxillofacial structures were also generated. COMPARISON:  CT head and maxillofacial 08/09/2019 FINDINGS: CT HEAD FINDINGS Brain: No evidence of acute infarction, hemorrhage, hydrocephalus, extra-axial collection or mass lesion/mass effect. Again seen is mild diffuse atrophy. There is stable mild periventricular white matter hypodensity, likely chronic small vessel ischemic change. Small old infarct in the right frontal periventricular white matter appears new from the prior examination. Vascular: Atherosclerotic calcifications are present within the cavernous internal carotid arteries. Skull: Normal. Negative for fracture or focal lesion. Other: There is left temporal scalp soft tissue swelling. CT MAXILLOFACIAL FINDINGS Osseous: No acute fractures are identified. There are old left medial and left inferior orbital wall fractures. No dislocation. Orbits: There is preseptal left orbital soft tissue swelling. Globes are intact bilaterally. Postseptal orbital  soft tissues are within normal limits. Sinuses: There is scattered opacification of left ethmoid air cells. There is mild mucosal thickening of the left maxillary sinus. No air-fluid levels are seen. The mastoid air cells appear clear. Soft tissues: Negative. CT CERVICAL SPINE FINDINGS Alignment: There is trace anterolisthesis at C4-C5 which is favored is degenerative. Alignment is otherwise anatomic. Skull base and vertebrae: No acute fracture. No primary bone lesion or focal pathologic process. Soft tissues and spinal canal: No prevertebral fluid or swelling. No visible canal hematoma. Disc levels: There is disc space narrowing throughout the cervical spine most significant at C3-C4, C5-C6 and C6-C7 compatible with degenerative change. Facet arthropathy is seen bilaterally. There is no severe central canal or neural foraminal stenosis. Upper chest: Negative. Other: None IMPRESSION: 1. No acute intracranial process. 2. No acute fracture or traumatic subluxation of cervical spine. 3. No acute facial fracture. 4. Mild progression of chronic small vessel ischemic change in the brain. 5. Old left orbital wall fractures. Electronically Signed   By: Ronney Asters M.D.   On: 02/03/2021 17:39   CT ABDOMEN PELVIS W CONTRAST  Result Date: 02/03/2021 CLINICAL DATA:  Abdominal trauma, fell on cement steps. EXAM: CT CHEST, ABDOMEN, AND PELVIS WITH CONTRAST TECHNIQUE: Multidetector CT imaging of the chest, abdomen and pelvis was performed following the standard protocol during bolus administration of intravenous contrast. CONTRAST:  113mL OMNIPAQUE IOHEXOL 300 MG/ML  SOLN COMPARISON:  None. CT chest 01/30/2021. FINDINGS: CT CHEST FINDINGS Cardiovascular: No significant vascular findings. Normal heart size. No pericardial effusion. There  are atherosclerotic calcifications of the aorta and coronary arteries Mediastinum/Nodes: No enlarged mediastinal, hilar, or axillary lymph nodes. Thyroid gland, trachea, and esophagus  demonstrate no significant findings. Right lower pericardial cyst appears unchanged from prior. Lungs/Pleura: There is some patchy airspace and ground-glass opacity new left lower lobe and minimal patchy ground-glass opacity in the right lower lobe. Elongated nodular density in the right middle lobe measures 3.1 by 1.0 cm and has slightly increased in size when compared to the prior examination. There is a small amount of pleural fluid adjacent to rib fractures posteriorly in the left hemithorax. There is no evidence for pneumothorax. Musculoskeletal: There are nondisplaced posterior left third fourth and eighth rib fractures. There are displaced left fifth, sixth and seventh posterior rib fractures. CT ABDOMEN PELVIS FINDINGS Hepatobiliary: There is a cyst in the inferior right lobe of the liver measuring 3.2 cm. There are additional rounded hypodensities in the liver which are too small to characterize, also likely cysts. Gallbladder and bile ducts are within normal limits. Pancreas: Unremarkable. No pancreatic ductal dilatation or surrounding inflammatory changes. Spleen: No splenic injury or perisplenic hematoma. Adrenals/Urinary Tract: There are cortical hypodensities in the right kidney which are too small to characterize, most likely cysts. The kidneys and adrenal glands are otherwise within normal limits. Bladder is distended and within normal limits. Stomach/Bowel: Stomach is within normal limits. Appendix appears normal. No evidence of bowel wall thickening, distention, or inflammatory changes. There is a large amount of stool throughout the colon. Vascular/Lymphatic: Aortic atherosclerosis. No enlarged abdominal or pelvic lymph nodes. Reproductive: Status post prostatectomy. Other: There is a small fat containing umbilical hernia. There is no ascites. There is no body wall hematoma. Musculoskeletal: No acute or significant osseous findings. IMPRESSION: 1. Acute posterior left third through eighth rib  fractures. Adjacent pleural thickening/hematoma. No pneumothorax. 2. Left lower lobe patchy airspace disease worrisome for infection. Pulmonary contusion not excluded. 3. Elongated lobulated density in the right middle lobe has increased in size. This is indeterminate. Neoplasm cannot be excluded. Consider follow-up PET-CT or tissue sampling. 4. No acute localizing process in the abdomen or pelvis. Electronically Signed   By: Ronney Asters M.D.   On: 02/03/2021 17:54   DG Pelvis Portable  Result Date: 02/03/2021 CLINICAL DATA:  Status post fall on blood thinners.  Trauma. EXAM: PORTABLE PELVIS 1-2 VIEWS COMPARISON:  None. FINDINGS: There is no evidence of pelvic fracture or diastasis. No acute displaced fracture or dislocation of bilateral hips on frontal view. No pelvic bone lesions are seen. Surgical clips overlie the pelvis. Phleboliths noted overlying the pelvis. IMPRESSION: Negative. Electronically Signed   By: Iven Finn M.D.   On: 02/03/2021 16:34   DG Chest Port 1 View  Result Date: 02/04/2021 CLINICAL DATA:  87 year old male with history of fall. Rib fractures. EXAM: PORTABLE CHEST 1 VIEW COMPARISON:  Chest x-ray 02/03/2021. FINDINGS: Lung volumes are low. Bibasilar opacities (left greater than right), favored to reflect areas of subsegmental atelectasis. Trace left pleural effusion. No definite right pleural effusion. No consolidative airspace disease. No pleural effusions. No pneumothorax confidently identified. No pulmonary nodule or mass noted. Pulmonary vasculature and the cardiomediastinal silhouette are within normal limits. Multiple posterior left-sided rib fractures are again noted (left third through eighth), better demonstrated on prior chest CT 02/03/2021. IMPRESSION: 1. Multiple posterior left-sided rib fractures with low lung volumes and bibasilar areas of subsegmental atelectasis. 2. Small left pleural effusion. 3. No definite pneumothorax. Electronically Signed   By: Mauri Brooklyn.D.  On: 02/04/2021 05:16   DG Chest Port 1 View  Result Date: 02/03/2021 CLINICAL DATA:  Status post fall on blood thinners.  Trauma. EXAM: PORTABLE CHEST 1 VIEW.  Top left apex collimated off view. COMPARISON:  Chest x-ray 10/19/2019, CT chest 01/30/2021 FINDINGS: The heart and mediastinal contours are unchanged. Aortic calcification. Persistent bilateral lower lobe airspace opacities with blunting of bilateral costophrenic angles findings better evaluated on 01/30/2021. No pulmonary edema. No pleural effusion. No pneumothorax. Acute minimally displaced posterior left fifth through seventh rib fractures. Likely acute nondisplaced left posterior third and fourth rib fractures. IMPRESSION: Acute minimally displaced posterior left 5-7th rib fractures. Likely acute nondisplaced left posterior 3rd and 4th rib fractures. No significant pneumothorax identified with top left apex collimated off view. Electronically Signed   By: Iven Finn M.D.   On: 02/03/2021 16:38   CT MAXILLOFACIAL WO CONTRAST  Result Date: 02/03/2021 CLINICAL DATA:  Facial trauma, fall on cement steps. EXAM: CT HEAD WITHOUT CONTRAST CT MAXILLOFACIAL WITHOUT CONTRAST CT CERVICAL SPINE WITHOUT CONTRAST TECHNIQUE: Multidetector CT imaging of the head, cervical spine, and maxillofacial structures were performed using the standard protocol without intravenous contrast. Multiplanar CT image reconstructions of the cervical spine and maxillofacial structures were also generated. COMPARISON:  CT head and maxillofacial 08/09/2019 FINDINGS: CT HEAD FINDINGS Brain: No evidence of acute infarction, hemorrhage, hydrocephalus, extra-axial collection or mass lesion/mass effect. Again seen is mild diffuse atrophy. There is stable mild periventricular white matter hypodensity, likely chronic small vessel ischemic change. Small old infarct in the right frontal periventricular white matter appears new from the prior examination. Vascular:  Atherosclerotic calcifications are present within the cavernous internal carotid arteries. Skull: Normal. Negative for fracture or focal lesion. Other: There is left temporal scalp soft tissue swelling. CT MAXILLOFACIAL FINDINGS Osseous: No acute fractures are identified. There are old left medial and left inferior orbital wall fractures. No dislocation. Orbits: There is preseptal left orbital soft tissue swelling. Globes are intact bilaterally. Postseptal orbital soft tissues are within normal limits. Sinuses: There is scattered opacification of left ethmoid air cells. There is mild mucosal thickening of the left maxillary sinus. No air-fluid levels are seen. The mastoid air cells appear clear. Soft tissues: Negative. CT CERVICAL SPINE FINDINGS Alignment: There is trace anterolisthesis at C4-C5 which is favored is degenerative. Alignment is otherwise anatomic. Skull base and vertebrae: No acute fracture. No primary bone lesion or focal pathologic process. Soft tissues and spinal canal: No prevertebral fluid or swelling. No visible canal hematoma. Disc levels: There is disc space narrowing throughout the cervical spine most significant at C3-C4, C5-C6 and C6-C7 compatible with degenerative change. Facet arthropathy is seen bilaterally. There is no severe central canal or neural foraminal stenosis. Upper chest: Negative. Other: None IMPRESSION: 1. No acute intracranial process. 2. No acute fracture or traumatic subluxation of cervical spine. 3. No acute facial fracture. 4. Mild progression of chronic small vessel ischemic change in the brain. 5. Old left orbital wall fractures. Electronically Signed   By: Ronney Asters M.D.   On: 02/03/2021 17:39    Anti-infectives: Anti-infectives (From admission, onward)    None        Assessment/Plan GLF Concussion - CT head negative. ST eval L facial contusion and ecchymoses - ice L facial lacs and abrasion- s/p repair EDPA 10/4. Bacitracin BID. Plan to d/c suture  ~10/11 L rib FX 3-8 with extrapleural hematoma - f/u CXR atelectasis, small left pleural effusion, no PNX. Pulmonary toilet, mulltimodal pain control  Density in  R middle lung lobe - incidental on CT, needs outpatient follow up Anticoagulation with Eliquis for DVT/PE - Hgb stable start eliquis HTN - home meds HLD  ID - none. Ucx pending FEN - reg diet VTE - SCDs, eliquis Foley - I&O cath x1, monitor UOP  Plan - TBI team therapies. Add scheduled tylenol and lidocaine patch for better pain control.    LOS: 1 day    Wellington Hampshire, [facility] Surgery 02/04/2021, 8:21 AM Please see Amion for pager number during day hours 7:00am-4:30pm

## 2021-02-04 NOTE — ED Notes (Signed)
Left number with floor. Rn to call back or Me call her back.  Bother numbers exchanged

## 2021-02-04 NOTE — ED Notes (Signed)
Assumed care of pt at this time. Did not receive verbal report from off going nurse

## 2021-02-04 NOTE — TOC CAGE-AID Note (Signed)
Transition of Care [facility]) - CAGE-AID Screening   Patient Details  Name: Brandon Burke MRN: [id number] Date of Birth: [birth year]  [facility] of [facility]) CM/SW Contact:    Brandon Burke, Brandon Burke Phone Number: 02/04/2021, 9:24 AM   Clinical Narrative: Pt is unable to participate in Cage Aid.  Pt is experiencing confusion.  Brandon Burke, MSW, LCSW-A Pronouns:  She/Her/Hers [facility] Clinical Social Worker Direct Number:  [phone] [email or web address]   CAGE-AID Screening: Substance Abuse Screening unable to be completed due to: : Patient unable to participate (Pt is experiencing confusion.)             Substance Abuse Education Offered: No

## 2021-02-04 NOTE — ED Notes (Signed)
Pt trying to urinate in urinal unable to do so. Bladder scan completed 545ml at this time admit provider paged. Pt also transported to radiology at this time

## 2021-02-05 LAB — CBC
HCT: 37.5 % — ABNORMAL LOW (ref 39.0–52.0)
Hemoglobin: 12.8 g/dL — ABNORMAL LOW (ref 13.0–17.0)
MCH: 33.5 pg (ref 26.0–34.0)
MCHC: 34.1 g/dL (ref 30.0–36.0)
MCV: 98.2 fL (ref 80.0–100.0)
Platelets: 138 10*3/uL — ABNORMAL LOW (ref 150–400)
RBC: 3.82 MIL/uL — ABNORMAL LOW (ref 4.22–5.81)
RDW: 12.5 % (ref 11.5–15.5)
WBC: 11.4 10*3/uL — ABNORMAL HIGH (ref 4.0–10.5)
nRBC: 0 % (ref 0.0–0.2)

## 2021-02-05 LAB — URINE CULTURE

## 2021-02-05 NOTE — Evaluation (Signed)
Speech Language Pathology Evaluation Patient Details Name: Brandon Burke MRN: [id number] DOB: Mar 05, 1932 Today's Date: 02/05/2021 Time: 1330-1403 SLP Time Calculation (min) (ACUTE ONLY): 33 min  Problem List:  Patient Active Problem List   Diagnosis Date Noted   Multiple fractures of ribs, left side, initial encounter for closed fracture 02/03/2021   Past Medical History: History reviewed. No pertinent past medical history. Past Surgical History: The histories are not reviewed yet. Please review them in the "History" navigator section and refresh this [place]. HPI:  87yo M admitted 10/4 after going to the barber when he had an unwitnessed fall. Pt with concussion, multiple contusions, and left 3-8 rib fxs with hematoma.  PMHx DVT/PE on Eliquis, prostate CA, HTN, and MVP. PLOF very independent per spouse.   Assessment / Plan / Recommendation Clinical Impression  Pt presents with cognitive defictis post fall consistent with Rancho VI this date (confused/appropriate). Spouse at bedside, supportive and provided hx to pts PLOF. Per spouse pt fully independent prior to fall including driving, medicine and financial management. Pt initially lethargic, arousable with cueing and exhibiting improvements in engaging in functional activities as session progressed. Pt with baseline left eye blindness (wears glasses at baseline; asked spouse to bring) and bilateral hearing aids. Pt oriented x2 with deficits in temporal orientation. He responded well to visual and auditory cues. Cognitive deficits appeared moderate in nature globally impacting attention, memory, executive function skills, and problem solving. Pt was very stimulable for improving cognition with skilled cueing and remains excellent candidate for rehabilitation based on motivation, participation, PLOF, and family support.    SLP Assessment  SLP Recommendation/Assessment: Patient needs continued Speech [place] Pathology Services SLP  Visit Diagnosis: Cognitive communication deficit (R41.841)    Recommendations for follow up therapy are one component of a multi-disciplinary discharge planning process, led by the attending physician.  Recommendations may be updated based on patient status, additional functional criteria and insurance authorization.    Follow Up Recommendations  Skilled Nursing facility;Inpatient Rehab (per PT/OT recs)    Frequency and Duration min 2x/week  2 weeks      SLP Evaluation Cognition  Overall Cognitive Status: Impaired/Different from baseline Arousal/Alertness: Awake/alert Orientation Level: Oriented to person;Disoriented to time;Disoriented to place;Oriented to situation Year: 2019 Month: January Day of Week: Incorrect Attention: Focused;Sustained Focused Attention: Impaired Sustained Attention: Impaired Memory: Impaired Memory Impairment: Decreased recall of new information;Decreased short term memory Awareness: Impaired Problem Solving: Impaired Executive Function: Organizing;Self Monitoring Organizing: Impaired Self Monitoring: Impaired Safety/Judgment: Impaired [place] [employer] Scales of Cognitive Functioning: Confused/appropriate       Comprehension  Auditory Comprehension Overall Auditory Comprehension: Appears within functional limits for tasks assessed    Expression Expression Primary Mode of Expression: Verbal Verbal Expression Overall Verbal Expression: Appears within functional limits for tasks assessed Written Expression Dominant Hand: Right   Oral / Motor  Oral Motor/Sensory Function Overall Oral Motor/Sensory Function: Within functional limits Motor Speech Overall Motor Speech: Appears within functional limits for tasks assessed   GO                    Hayden Rasmussen MA, CCC-SLP Acute Rehabilitation Services   02/05/2021, 2:15 PM

## 2021-02-05 NOTE — Discharge Instructions (Signed)
Information on my medicine - ELIQUIS (apixaban)  Why was Eliquis prescribed for you? Eliquis was prescribed for you to reduce the risk of blood clots forming.  What do You need to know about Eliquis? Take your Eliquis TWICE DAILY - one tablet in the morning and one tablet in the evening with or without food.  It would be best to take the dose about the same time each day.  If you have difficulty swallowing the tablet whole please discuss with your pharmacist how to take the medication safely.  Take Eliquis exactly as prescribed by your doctor and DO NOT stop taking Eliquis without talking to the doctor who prescribed the medication.  Stopping without other medication to take the place of Eliquis may increase your risk of developing a clot.  After discharge, you should have regular check-up appointments with your healthcare provider that is prescribing your Eliquis.  What do you do if you miss a dose? If a dose of ELIQUIS is not taken at the scheduled time, take it as soon as possible on the same day and twice-daily administration should be resumed.  The dose should not be doubled to make up for a missed dose.  Do not take more than one tablet of ELIQUIS at the same time.  Important Safety Information A possible side effect of Eliquis is bleeding. You should call your healthcare provider right away if you experience any of the following: Bleeding from an injury or your nose that does not stop. Unusual colored urine (red or dark brown) or unusual colored stools (red or black). Unusual bruising for unknown reasons. A serious fall or if you hit your head (even if there is no bleeding).  Some medicines may interact with Eliquis and might increase your risk of bleeding or clotting while on Eliquis. To help avoid this, consult your healthcare provider or pharmacist prior to using any new prescription or non-prescription medications, including herbals, vitamins, non-steroidal  anti-inflammatory drugs (NSAIDs) and supplements.  This website has more information on Eliquis (apixaban): http://[email or web address].com/eliquis/home

## 2021-02-05 NOTE — Evaluation (Addendum)
Occupational Therapy Evaluation Patient Details Name: Brandon Burke MRN: [id number] DOB: [birth year] Today's Date: 02/05/2021   History of Present Illness 87yo M admitted 10/4 after going to the bank when he had an unwitnessed fall. Pt with concussion, multiple contusions, and left 3-8 rib fxs with hematoma.  PMHx DVT/PE on Eliquis, prostate CA, HTN, and MVP   Clinical Impression   PTA, pt was living with his wife and was independent with ADLs and IADLs. Pt currently requiring Mod A for UB ADLs, Mod-Max A for LB ADLs, and  Min Guard-Min A for functional mobility with RW. Pt presenting with limited strength and ROM due to pain and cognitive deficits. Presenting as Ranch[place] VI with consistently following simple commands and also demonstrating confusion. Pt with good family support and brother reporting he plans to stay with pt at dc. Pt will require further acute OT to facilitate safe dc. Due to patient's high motivation despite pain, cognitive changes, and good family support, recommend dc to CIR for intensive OT to optimize safety, independence with ADLs, and return to PLOF.      Recommendations for follow up therapy are one component of a multi-disciplinary discharge planning process, led by the attending physician.  Recommendations may be updated based on patient status, additional functional criteria and insurance authorization.   Follow Up Recommendations  CIR;Supervision/Assistance - 24 hour    Equipment Recommendations  Other (comment) (Defer to next venue)    Recommendations for Other Services PT consult     Precautions / Restrictions Precautions Precautions: Fall      Mobility Bed Mobility Overal bed mobility: Needs Assistance Bed Mobility: Rolling;Sidelying to Sit Rolling: Mod assist Sidelying to sit: Mod assist       General bed mobility comments: Use of log roll technique to reduce pain. Rolling to R and pt holding pillow to stabilize L ribs. Mod A for  rolling and then to bring BLEs over EOB and elevate trunk.    Transfers Overall transfer level: Needs assistance Equipment used: Rolling walker (2 wheeled) Transfers: Sit to/from [facility] Sit to Stand: From elevated surface;Min assist         General transfer comment: Min A for power up from elevated surface. Noting forward bend of trunk (possibly due to pain).    Balance Overall balance assessment: Needs assistance Sitting-balance support: Bilateral upper extremity supported;Feet supported Sitting balance-Leahy Scale: Poor Sitting balance - Comments: relies on UE support and took incr time for pt to get his balance upon initial sitting.   Standing balance support: Bilateral upper extremity supported;During functional activity Standing balance-Leahy Scale: Poor Standing balance comment: relies on UE support as well as external support                           ADL either performed or assessed with clinical judgement   ADL Overall ADL's : Needs assistance/impaired Eating/Feeding: Set up;Sitting Eating/Feeding Details (indicate cue type and reason): Family reports he was Grooming: Minimal assistance;Sitting   Upper Body Bathing: Moderate assistance;Sitting   Lower Body Bathing: Moderate assistance;Sit to/from stand   Upper Body Dressing : Moderate assistance;Sitting   Lower Body Dressing: Maximal assistance;Sit to/from stand Lower Body Dressing Details (indicate cue type and reason): Family reports that pt was able to perform figure four prior to accident             Functional mobility during ADLs: Minimal assistance;Rolling walker General ADL Comments: Pt limited by pain and  presenting cognitive deficits     Vision   Vision Assessment?: Vision impaired- to be further tested in functional context Additional Comments: Blind in L eye post fall. BLinding and closing eyes frequently.     Perception     Praxis      Pertinent  Vitals/Pain Pain Assessment: Faces Faces Pain Scale: Hurts even more Pain Location: R ribs Pain Descriptors / Indicators: Aching;Grimacing;Guarding Pain Intervention(s): Monitored during session;Limited activity within patient's tolerance;Repositioned     Hand Dominance Right   Extremity/Trunk Assessment Upper Extremity Assessment Upper Extremity Assessment: RUE deficits/detail RUE Deficits / Details: limited ROM due to pain at RUE   Lower Extremity Assessment Lower Extremity Assessment: Generalized weakness   Cervical / Trunk Assessment Cervical / Trunk Assessment: Kyphotic   Communication Communication Communication: No difficulties   Cognition Arousal/Alertness: Lethargic Behavior During Therapy: Flat affect Overall Cognitive Status: Impaired/Different from baseline Area of Impairment: Orientation;Following commands;Safety/judgement;Awareness;Problem solving;Memory               Rancho Levels of Cognitive Functioning Ranch[place] Scales of Cognitive Functioning: Confused/appropriate Orientation Level: Disoriented to;Time (unsure of month)   Memory: Decreased short-term memory;Decreased recall of precautions Following Commands: Follows one step commands inconsistently;Follows one step commands with increased time Safety/Judgement: Decreased awareness of safety;Decreased awareness of deficits   Problem Solving: Decreased initiation;Slow processing;Difficulty sequencing;Requires verbal cues;Requires tactile cues General Comments: Responding to questions. As session progressed, pt becoming mroe fatigued and pt talking less.   General Comments  Brother and wife present during session    Exercises     Shoulder Instructions      Home Living Family/patient expects to be discharged to:: Private residence Living Arrangements: Spouse/significant other Available Help at Discharge: Family;Available 24 hours/day (spouse, pts brother very supportive and can assist at  DC) Type of Home: Apartment Home Access: Level entry     Home Layout: One level     Bathroom Shower/Tub: Occupational psychologist: Standard     Home Equipment: Shower seat;Grab bars - tub/shower;Hand held Tourist information centre manager - 2 wheels;Crutches      Lives With: Spouse    Prior Functioning/Environment Level of Independence: Independent        Comments: Pt and wife reside at [facility]        OT Problem List: Decreased range of motion;Decreased strength;Decreased activity tolerance;Impaired balance (sitting and/or standing);Decreased safety awareness;Decreased knowledge of use of DME or AE;Decreased knowledge of precautions      OT Treatment/Interventions: Self-care/ADL training;Therapeutic exercise;Energy conservation;DME and/or AE instruction;Therapeutic activities;Patient/family education    OT Goals(Current goals can be found in the care plan section) Acute Rehab OT Goals Patient Stated Goal: Get better and go home OT Goal Formulation: With patient/family Time For Goal Achievement: 02/19/21 Potential to Achieve Goals: Good  OT Frequency: Min 2X/week   Barriers to D/C:            Co-evaluation              AM-PAC OT "6 Clicks" Daily Activity     Outcome Measure Help from another person eating meals?: A Little Help from another person taking care of personal grooming?: A Little Help from another person toileting, which includes using toliet, bedpan, or urinal?: A Lot Help from another person bathing (including washing, rinsing, drying)?: A Lot Help from another person to put on and taking off regular upper body clothing?: A Lot Help from another person to put on and taking off regular lower body clothing?: A  Lot 6 Click Score: 14   End of Session Equipment Utilized During Treatment: Gait belt;Rolling walker Nurse Communication: Mobility status  Activity Tolerance: Patient tolerated treatment well Patient left: in chair;with call  bell/phone within reach;with chair alarm set  OT Visit Diagnosis: Unsteadiness on feet (R26.81);Other abnormalities of gait and mobility (R26.89);Muscle weakness (generalized) (M62.81);Pain                Time: 4103-0131 OT Time Calculation (min): 39 min Charges:  OT General Charges $OT Visit: 1 Visit OT Evaluation $OT Eval Moderate Complexity: 1 Mod OT Treatments $Self Care/Home Management : 23-37 mins  Aylen Stradford MSOT, OTR/L Acute Rehab Pager: [phone] Office: [place] 02/05/2021, 5:20 PM

## 2021-02-05 NOTE — Progress Notes (Signed)
Physical Therapy Evaluation Patient Details Name: Brandon Burke MRN: [id number] DOB: [birth year] Today's Date: 02/05/2021  History of Present Illness  87yo M admitted 10/4 after going to the bank when he had an unwitnessed fall. Pt with concussion, multiple contusions, and left 3-8 rib fxs with hematoma.  PMHx DVT/PE on Eliquis, prostate CA, HTN, and MVP  Clinical Impression  Pt admitted with above diagnosis. Pt currently needing total assist to come to EOB and mod to max assist for transfers.  Pt limited by decline in mobility and confusion. Pt and wife interested in SNF affiliated with [facility] where they currently reside. Will follow acutely.  Pt currently with functional limitations due to the deficits listed below (see PT Problem List). Pt will benefit from skilled PT to increase their independence and safety with mobility to allow discharge to the venue listed below.          Recommendations for follow up therapy are one component of a multi-disciplinary discharge planning process, led by the attending physician.  Recommendations may be updated based on patient status, additional functional criteria and insurance authorization.  Follow Up Recommendations SNF;Supervision/Assistance - 24 hour (Pt lives at [facility] and wife requests SNF affiliated with this.)    Equipment Recommendations  None recommended by PT    Recommendations for Other Services       Precautions / Restrictions Precautions Precautions: Fall Restrictions Weight Bearing Restrictions: No      Mobility  Bed Mobility Overal bed mobility: Needs Assistance Bed Mobility: Supine to Sit     Supine to sit: Total assist;HOB elevated     General bed mobility comments: Pt could not assist to come to eOB due to pain therefore total assist with HOB raised and use of pad to get pt to eOB. Once at EOB, pt kept head down and had difficulty looking up to command.  Also keeping both eyes closed most of  session.    Transfers Overall transfer level: Needs assistance Equipment used: Rolling walker (2 wheeled) Transfers: Sit to/from [facility] Sit to Stand: Mod assist;From elevated surface;+2 safety/equipment Stand pivot transfers: Max assist;+2 safety/equipment       General transfer comment: Pt stood with mod assist and once up was min assist to steady with slightly flexed at trunk. Pt able to take pivotal steps around to chair and pt stated he would try to walk forward however pt appeared to not be able to move his feet therefore the chair was brought to the pt.  Gave commands for pt to sit however pt continued to stand but was flexing at trunk.  Unsure if motor processing was impaired as pt appeared to not be able to follow commands to sit in chair.  PT had to assist pt to sit in the chair and pt did not let go of RW therefore had to assist him to sit and even after sitting, had to assist pts hands to let go of RW.  Pt stated he could hear PT talking but could not respond to the commands.  VSS.  Ambulation/Gait             General Gait Details: Unable to take steps  Stairs            Wheelchair Mobility    Modified Rankin (Stroke Patients Only)       Balance Overall balance assessment: Needs assistance Sitting-balance support: Bilateral upper extremity supported;Feet supported Sitting balance-Leahy Scale: Poor Sitting balance - Comments: relies  on UE support and took incr time for pt to get his balance upon initial sitting.   Standing balance support: Bilateral upper extremity supported;During functional activity Standing balance-Leahy Scale: Poor Standing balance comment: relies on UE support as well as external support                             Pertinent Vitals/Pain Pain Assessment: Faces Faces Pain Scale: Hurts worst Pain Location: ribs Pain Descriptors / Indicators: Aching;Grimacing;Guarding Pain Intervention(s): Limited  activity within patient's tolerance;Monitored during session;Repositioned    Home Living Family/patient expects to be discharged to:: Private residence Living Arrangements: Spouse/significant other Available Help at Discharge: Family;Available 24 hours/day (wife and brother) Type of Home: Apartment Home Access: Level entry     Home Layout: One level Home Equipment: Shower seat;Grab bars - tub/shower;Hand held Tourist information centre manager - 2 wheels;Crutches      Prior Function Level of Independence: Independent         Comments: Pt and wife reside at [facility] [place]   Dominant Hand: Right    Extremity/Trunk Assessment   Upper Extremity Assessment Upper Extremity Assessment: Defer to OT evaluation    Lower Extremity Assessment Lower Extremity Assessment: Generalized weakness    Cervical / Trunk Assessment Cervical / Trunk Assessment: Kyphotic  Communication   Communication: No difficulties  Cognition Arousal/Alertness: Lethargic Behavior During Therapy: Flat affect Overall Cognitive Status: Impaired/Different from baseline Area of Impairment: Orientation;Following commands;Safety/judgement;Awareness;Problem solving;Memory                 Orientation Level: Disoriented to;Time (unsure of month)   Memory: Decreased short-term memory;Decreased recall of precautions Following Commands: Follows one step commands inconsistently;Follows one step commands with increased time Safety/Judgement: Decreased awareness of safety;Decreased awareness of deficits   Problem Solving: Decreased initiation;Slow processing;Difficulty sequencing;Requires verbal cues;Requires tactile cues        General Comments General comments (skin integrity, edema, etc.): multiple abrasions on pt.    Exercises     Assessment/Plan    PT Assessment Patient needs continued PT services  PT Problem List Decreased mobility;Decreased coordination;Decreased balance;Decreased  activity tolerance;Decreased knowledge of use of DME;Decreased safety awareness;Decreased knowledge of precautions;Pain       PT Treatment Interventions DME instruction;Functional mobility training;Therapeutic activities;Gait training;Therapeutic exercise;Balance training;Patient/family education    PT Goals (Current goals can be found in the Care Plan section)  Acute Rehab PT Goals Patient Stated Goal: to go to SNF PT Goal Formulation: With patient Time For Goal Achievement: 02/19/21 Potential to Achieve Goals: Good    Frequency Min 3X/week   Barriers to discharge        Co-evaluation               AM-PAC PT "6 Clicks" Mobility  Outcome Measure Help needed turning from your back to your side while in a flat bed without using bedrails?: Total Help needed moving from lying on your back to sitting on the side of a flat bed without using bedrails?: Total Help needed moving to and from a bed to a chair (including a wheelchair)?: A Lot Help needed standing up from a chair using your arms (e.g., wheelchair or bedside chair)?: A Lot Help needed to walk in hospital room?: Total Help needed climbing 3-5 steps with a railing? : Total 6 Click Score: 8    End of Session Equipment Utilized During Treatment: Gait belt Activity Tolerance: Patient limited by fatigue Patient  left: in chair;with call bell/phone within reach;with chair alarm set;with family/visitor present Nurse Communication: Mobility status PT Visit Diagnosis: Unsteadiness on feet (R26.81);Muscle weakness (generalized) (M62.81);Pain Pain - part of body:  (ribs)    Time: 1749-4496 PT Time Calculation (min) (ACUTE ONLY): 25 min   Charges:   PT Evaluation $PT Eval Moderate Complexity: 1 Mod PT Treatments $Therapeutic Activity: 8-22 mins        Geffrey Michaelsen M,PT Acute Rehab Services [phone] (pager)   Alvira Philips 02/05/2021, 11:30 AM

## 2021-02-05 NOTE — Progress Notes (Signed)
   02/05/21 1235  Clinical Encounter Type  Visited With Other (Comment) (Spoke nurse, Otila Kluver)  Visit Type Social support  Referral From Nurse  Consult/Referral To Chaplain   Inquired if the Spiritual Care dept--offered meal vouchers for family members experiencing hardship. The nurse- Otila Kluver, called, stating the patient's wife has been staying overnight and does not have funds to buy food. I advised we do not and referred her to her unit leaders and the unit Education officer, museum. If no resources are available, let me know. This note was prepared by Jeanine Luz, M.Div..  For questions please contact by phone [phone].

## 2021-02-05 NOTE — Progress Notes (Signed)
Tele called RN and notified that patient had a 2.807 second pause which caused his HR to drop down to 29, HR nonsustained. Trauma PA paged awaiting call back.

## 2021-02-05 NOTE — Progress Notes (Signed)
Patient ID: Brandon Burke, male   DOB: 02-15-1932, 87 y.o.   MRN: [id number]  [facility] Surgery Progress Note     Subjective: CC-  Wife at bedside. Patient still confused about some things.  Hasn't eaten yet today.  Hasn't worked with therapies.  No other complaints.  Voiding on his own per wife  Objective: Vital signs in last 24 hours: Temp:  [97.6 F (36.4 C)-98.5 F (36.9 C)] 98.2 F (36.8 C) (10/06 0802) Pulse Rate:  [60-90] 64 (10/06 0802) Resp:  [16-18] 18 (10/06 0802) BP: (95-162)/(44-83) 119/51 (10/06 0802) SpO2:  [96 %-99 %] 98 % (10/06 0802)    Intake/Output from previous day: 10/05 0701 - 10/06 0700 In: 240 [P.O.:240] Out: 900 [Urine:900] Intake/Output this shift: Total I/O In: 240 [P.O.:240] Out: -   PE: Gen:  Alert, NAD, pleasant HEENT: R EOM's intact. L eye shut, but lifted open by myself with surrounding ecchymosis. Left supraorbital abrasion and laceration, sutures intact, no active bleeding Card:  RRR, no M/G/R heard, 2+ DP pulses Pulm:  CTAB, no W/R/R, rate and effort normal on RA, pulling 1250 on IS for me today Abd: Soft, NT/ND, +BS, no HSM, umbilical hernia small and reducible Ext:  no BUE/BLE edema, calves soft and nontender Psych: Alert, oriented only to self and sort of location with multiple choice only.  Doesn't know year or president Skin: no rashes noted, warm and dry  Lab Results:  Recent Labs    02/04/21 0326 02/05/21 0135  WBC 10.3 11.4*  HGB 14.1 12.8*  HCT 41.0 37.5*  PLT 148* 138*   BMET Recent Labs    02/03/21 1608 02/03/21 1619 02/04/21 0326  NA 138 140 136  K 4.2 4.2 4.5  CL 107 107 103  CO2 25  --  23  GLUCOSE 128* 122* 131*  BUN 21 21 19   CREATININE 1.30* 1.30* 1.19  CALCIUM 8.9  --  9.3   PT/INR Recent Labs    02/03/21 1608  LABPROT 14.4  INR 1.1   CMP     Component Value Date/Time   NA 136 02/04/2021 0326   K 4.5 02/04/2021 0326   CL 103 02/04/2021 0326   CO2 23 02/04/2021 0326    GLUCOSE 131 (H) 02/04/2021 0326   BUN 19 02/04/2021 0326   CREATININE 1.19 02/04/2021 0326   CALCIUM 9.3 02/04/2021 0326   PROT 6.6 02/03/2021 1608   ALBUMIN 3.5 02/03/2021 1608   AST 26 02/03/2021 1608   ALT 28 02/03/2021 1608   ALKPHOS 70 02/03/2021 1608   BILITOT 0.6 02/03/2021 1608   GFRNONAA 59 (L) 02/04/2021 0326   Lipase  No results found for: LIPASE     Studies/Results: CT HEAD WO CONTRAST  Result Date: 02/03/2021 CLINICAL DATA:  Facial trauma, fall on cement steps. EXAM: CT HEAD WITHOUT CONTRAST CT MAXILLOFACIAL WITHOUT CONTRAST CT CERVICAL SPINE WITHOUT CONTRAST TECHNIQUE: Multidetector CT imaging of the head, cervical spine, and maxillofacial structures were performed using the standard protocol without intravenous contrast. Multiplanar CT image reconstructions of the cervical spine and maxillofacial structures were also generated. COMPARISON:  CT head and maxillofacial 08/09/2019 FINDINGS: CT HEAD FINDINGS Brain: No evidence of acute infarction, hemorrhage, hydrocephalus, extra-axial collection or mass lesion/mass effect. Again seen is mild diffuse atrophy. There is stable mild periventricular white matter hypodensity, likely chronic small vessel ischemic change. Small old infarct in the right frontal periventricular white matter appears new from the prior examination. Vascular: Atherosclerotic calcifications are present within the cavernous internal  carotid arteries. Skull: Normal. Negative for fracture or focal lesion. Other: There is left temporal scalp soft tissue swelling. CT MAXILLOFACIAL FINDINGS Osseous: No acute fractures are identified. There are old left medial and left inferior orbital wall fractures. No dislocation. Orbits: There is preseptal left orbital soft tissue swelling. Globes are intact bilaterally. Postseptal orbital soft tissues are within normal limits. Sinuses: There is scattered opacification of left ethmoid air cells. There is mild mucosal thickening of the  left maxillary sinus. No air-fluid levels are seen. The mastoid air cells appear clear. Soft tissues: Negative. CT CERVICAL SPINE FINDINGS Alignment: There is trace anterolisthesis at C4-C5 which is favored is degenerative. Alignment is otherwise anatomic. Skull base and vertebrae: No acute fracture. No primary bone lesion or focal pathologic process. Soft tissues and spinal canal: No prevertebral fluid or swelling. No visible canal hematoma. Disc levels: There is disc space narrowing throughout the cervical spine most significant at C3-C4, C5-C6 and C6-C7 compatible with degenerative change. Facet arthropathy is seen bilaterally. There is no severe central canal or neural foraminal stenosis. Upper chest: Negative. Other: None IMPRESSION: 1. No acute intracranial process. 2. No acute fracture or traumatic subluxation of cervical spine. 3. No acute facial fracture. 4. Mild progression of chronic small vessel ischemic change in the brain. 5. Old left orbital wall fractures. Electronically Signed   By: Ronney Asters M.D.   On: 02/03/2021 17:39   CT CHEST W CONTRAST  Result Date: 02/03/2021 CLINICAL DATA:  Abdominal trauma, fell on cement steps. EXAM: CT CHEST, ABDOMEN, AND PELVIS WITH CONTRAST TECHNIQUE: Multidetector CT imaging of the chest, abdomen and pelvis was performed following the standard protocol during bolus administration of intravenous contrast. CONTRAST:  15mL OMNIPAQUE IOHEXOL 300 MG/ML  SOLN COMPARISON:  None. CT chest 01/30/2021. FINDINGS: CT CHEST FINDINGS Cardiovascular: No significant vascular findings. Normal heart size. No pericardial effusion. There are atherosclerotic calcifications of the aorta and coronary arteries Mediastinum/Nodes: No enlarged mediastinal, hilar, or axillary lymph nodes. Thyroid gland, trachea, and esophagus demonstrate no significant findings. Right lower pericardial cyst appears unchanged from prior. Lungs/Pleura: There is some patchy airspace and ground-glass opacity  new left lower lobe and minimal patchy ground-glass opacity in the right lower lobe. Elongated nodular density in the right middle lobe measures 3.1 by 1.0 cm and has slightly increased in size when compared to the prior examination. There is a small amount of pleural fluid adjacent to rib fractures posteriorly in the left hemithorax. There is no evidence for pneumothorax. Musculoskeletal: There are nondisplaced posterior left third fourth and eighth rib fractures. There are displaced left fifth, sixth and seventh posterior rib fractures. CT ABDOMEN PELVIS FINDINGS Hepatobiliary: There is a cyst in the inferior right lobe of the liver measuring 3.2 cm. There are additional rounded hypodensities in the liver which are too small to characterize, also likely cysts. Gallbladder and bile ducts are within normal limits. Pancreas: Unremarkable. No pancreatic ductal dilatation or surrounding inflammatory changes. Spleen: No splenic injury or perisplenic hematoma. Adrenals/Urinary Tract: There are cortical hypodensities in the right kidney which are too small to characterize, most likely cysts. The kidneys and adrenal glands are otherwise within normal limits. Bladder is distended and within normal limits. Stomach/Bowel: Stomach is within normal limits. Appendix appears normal. No evidence of bowel wall thickening, distention, or inflammatory changes. There is a large amount of stool throughout the colon. Vascular/Lymphatic: Aortic atherosclerosis. No enlarged abdominal or pelvic lymph nodes. Reproductive: Status post prostatectomy. Other: There is a small fat containing umbilical  hernia. There is no ascites. There is no body wall hematoma. Musculoskeletal: No acute or significant osseous findings. IMPRESSION: 1. Acute posterior left third through eighth rib fractures. Adjacent pleural thickening/hematoma. No pneumothorax. 2. Left lower lobe patchy airspace disease worrisome for infection. Pulmonary contusion not excluded.  3. Elongated lobulated density in the right middle lobe has increased in size. This is indeterminate. Neoplasm cannot be excluded. Consider follow-up PET-CT or tissue sampling. 4. No acute localizing process in the abdomen or pelvis. Electronically Signed   By: Ronney Asters M.D.   On: 02/03/2021 17:54   CT CERVICAL SPINE WO CONTRAST  Result Date: 02/03/2021 CLINICAL DATA:  Facial trauma, fall on cement steps. EXAM: CT HEAD WITHOUT CONTRAST CT MAXILLOFACIAL WITHOUT CONTRAST CT CERVICAL SPINE WITHOUT CONTRAST TECHNIQUE: Multidetector CT imaging of the head, cervical spine, and maxillofacial structures were performed using the standard protocol without intravenous contrast. Multiplanar CT image reconstructions of the cervical spine and maxillofacial structures were also generated. COMPARISON:  CT head and maxillofacial 08/09/2019 FINDINGS: CT HEAD FINDINGS Brain: No evidence of acute infarction, hemorrhage, hydrocephalus, extra-axial collection or mass lesion/mass effect. Again seen is mild diffuse atrophy. There is stable mild periventricular white matter hypodensity, likely chronic small vessel ischemic change. Small old infarct in the right frontal periventricular white matter appears new from the prior examination. Vascular: Atherosclerotic calcifications are present within the cavernous internal carotid arteries. Skull: Normal. Negative for fracture or focal lesion. Other: There is left temporal scalp soft tissue swelling. CT MAXILLOFACIAL FINDINGS Osseous: No acute fractures are identified. There are old left medial and left inferior orbital wall fractures. No dislocation. Orbits: There is preseptal left orbital soft tissue swelling. Globes are intact bilaterally. Postseptal orbital soft tissues are within normal limits. Sinuses: There is scattered opacification of left ethmoid air cells. There is mild mucosal thickening of the left maxillary sinus. No air-fluid levels are seen. The mastoid air cells appear  clear. Soft tissues: Negative. CT CERVICAL SPINE FINDINGS Alignment: There is trace anterolisthesis at C4-C5 which is favored is degenerative. Alignment is otherwise anatomic. Skull base and vertebrae: No acute fracture. No primary bone lesion or focal pathologic process. Soft tissues and spinal canal: No prevertebral fluid or swelling. No visible canal hematoma. Disc levels: There is disc space narrowing throughout the cervical spine most significant at C3-C4, C5-C6 and C6-C7 compatible with degenerative change. Facet arthropathy is seen bilaterally. There is no severe central canal or neural foraminal stenosis. Upper chest: Negative. Other: None IMPRESSION: 1. No acute intracranial process. 2. No acute fracture or traumatic subluxation of cervical spine. 3. No acute facial fracture. 4. Mild progression of chronic small vessel ischemic change in the brain. 5. Old left orbital wall fractures. Electronically Signed   By: Ronney Asters M.D.   On: 02/03/2021 17:39   CT ABDOMEN PELVIS W CONTRAST  Result Date: 02/03/2021 CLINICAL DATA:  Abdominal trauma, fell on cement steps. EXAM: CT CHEST, ABDOMEN, AND PELVIS WITH CONTRAST TECHNIQUE: Multidetector CT imaging of the chest, abdomen and pelvis was performed following the standard protocol during bolus administration of intravenous contrast. CONTRAST:  122mL OMNIPAQUE IOHEXOL 300 MG/ML  SOLN COMPARISON:  None. CT chest 01/30/2021. FINDINGS: CT CHEST FINDINGS Cardiovascular: No significant vascular findings. Normal heart size. No pericardial effusion. There are atherosclerotic calcifications of the aorta and coronary arteries Mediastinum/Nodes: No enlarged mediastinal, hilar, or axillary lymph nodes. Thyroid gland, trachea, and esophagus demonstrate no significant findings. Right lower pericardial cyst appears unchanged from prior. Lungs/Pleura: There is some patchy airspace  and ground-glass opacity new left lower lobe and minimal patchy ground-glass opacity in the  right lower lobe. Elongated nodular density in the right middle lobe measures 3.1 by 1.0 cm and has slightly increased in size when compared to the prior examination. There is a small amount of pleural fluid adjacent to rib fractures posteriorly in the left hemithorax. There is no evidence for pneumothorax. Musculoskeletal: There are nondisplaced posterior left third fourth and eighth rib fractures. There are displaced left fifth, sixth and seventh posterior rib fractures. CT ABDOMEN PELVIS FINDINGS Hepatobiliary: There is a cyst in the inferior right lobe of the liver measuring 3.2 cm. There are additional rounded hypodensities in the liver which are too small to characterize, also likely cysts. Gallbladder and bile ducts are within normal limits. Pancreas: Unremarkable. No pancreatic ductal dilatation or surrounding inflammatory changes. Spleen: No splenic injury or perisplenic hematoma. Adrenals/Urinary Tract: There are cortical hypodensities in the right kidney which are too small to characterize, most likely cysts. The kidneys and adrenal glands are otherwise within normal limits. Bladder is distended and within normal limits. Stomach/Bowel: Stomach is within normal limits. Appendix appears normal. No evidence of bowel wall thickening, distention, or inflammatory changes. There is a large amount of stool throughout the colon. Vascular/Lymphatic: Aortic atherosclerosis. No enlarged abdominal or pelvic lymph nodes. Reproductive: Status post prostatectomy. Other: There is a small fat containing umbilical hernia. There is no ascites. There is no body wall hematoma. Musculoskeletal: No acute or significant osseous findings. IMPRESSION: 1. Acute posterior left third through eighth rib fractures. Adjacent pleural thickening/hematoma. No pneumothorax. 2. Left lower lobe patchy airspace disease worrisome for infection. Pulmonary contusion not excluded. 3. Elongated lobulated density in the right middle lobe has  increased in size. This is indeterminate. Neoplasm cannot be excluded. Consider follow-up PET-CT or tissue sampling. 4. No acute localizing process in the abdomen or pelvis. Electronically Signed   By: Ronney Asters M.D.   On: 02/03/2021 17:54   DG Pelvis Portable  Result Date: 02/03/2021 CLINICAL DATA:  Status post fall on blood thinners.  Trauma. EXAM: PORTABLE PELVIS 1-2 VIEWS COMPARISON:  None. FINDINGS: There is no evidence of pelvic fracture or diastasis. No acute displaced fracture or dislocation of bilateral hips on frontal view. No pelvic bone lesions are seen. Surgical clips overlie the pelvis. Phleboliths noted overlying the pelvis. IMPRESSION: Negative. Electronically Signed   By: Iven Finn M.D.   On: 02/03/2021 16:34   DG Chest Port 1 View  Result Date: 02/04/2021 CLINICAL DATA:  87 year old male with history of fall. Rib fractures. EXAM: PORTABLE CHEST 1 VIEW COMPARISON:  Chest x-ray 02/03/2021. FINDINGS: Lung volumes are low. Bibasilar opacities (left greater than right), favored to reflect areas of subsegmental atelectasis. Trace left pleural effusion. No definite right pleural effusion. No consolidative airspace disease. No pleural effusions. No pneumothorax confidently identified. No pulmonary nodule or mass noted. Pulmonary vasculature and the cardiomediastinal silhouette are within normal limits. Multiple posterior left-sided rib fractures are again noted (left third through eighth), better demonstrated on prior chest CT 02/03/2021. IMPRESSION: 1. Multiple posterior left-sided rib fractures with low lung volumes and bibasilar areas of subsegmental atelectasis. 2. Small left pleural effusion. 3. No definite pneumothorax. Electronically Signed   By: Vinnie Langton M.D.   On: 02/04/2021 05:16   DG Chest Port 1 View  Result Date: 02/03/2021 CLINICAL DATA:  Status post fall on blood thinners.  Trauma. EXAM: PORTABLE CHEST 1 VIEW.  Top left apex collimated off view. COMPARISON:  Chest x-ray 10/19/2019, CT chest 01/30/2021 FINDINGS: The heart and mediastinal contours are unchanged. Aortic calcification. Persistent bilateral lower lobe airspace opacities with blunting of bilateral costophrenic angles findings better evaluated on 01/30/2021. No pulmonary edema. No pleural effusion. No pneumothorax. Acute minimally displaced posterior left fifth through seventh rib fractures. Likely acute nondisplaced left posterior third and fourth rib fractures. IMPRESSION: Acute minimally displaced posterior left 5-7th rib fractures. Likely acute nondisplaced left posterior 3rd and 4th rib fractures. No significant pneumothorax identified with top left apex collimated off view. Electronically Signed   By: Iven Finn M.D.   On: 02/03/2021 16:38   CT MAXILLOFACIAL WO CONTRAST  Result Date: 02/03/2021 CLINICAL DATA:  Facial trauma, fall on cement steps. EXAM: CT HEAD WITHOUT CONTRAST CT MAXILLOFACIAL WITHOUT CONTRAST CT CERVICAL SPINE WITHOUT CONTRAST TECHNIQUE: Multidetector CT imaging of the head, cervical spine, and maxillofacial structures were performed using the standard protocol without intravenous contrast. Multiplanar CT image reconstructions of the cervical spine and maxillofacial structures were also generated. COMPARISON:  CT head and maxillofacial 08/09/2019 FINDINGS: CT HEAD FINDINGS Brain: No evidence of acute infarction, hemorrhage, hydrocephalus, extra-axial collection or mass lesion/mass effect. Again seen is mild diffuse atrophy. There is stable mild periventricular white matter hypodensity, likely chronic small vessel ischemic change. Small old infarct in the right frontal periventricular white matter appears new from the prior examination. Vascular: Atherosclerotic calcifications are present within the cavernous internal carotid arteries. Skull: Normal. Negative for fracture or focal lesion. Other: There is left temporal scalp soft tissue swelling. CT MAXILLOFACIAL FINDINGS  Osseous: No acute fractures are identified. There are old left medial and left inferior orbital wall fractures. No dislocation. Orbits: There is preseptal left orbital soft tissue swelling. Globes are intact bilaterally. Postseptal orbital soft tissues are within normal limits. Sinuses: There is scattered opacification of left ethmoid air cells. There is mild mucosal thickening of the left maxillary sinus. No air-fluid levels are seen. The mastoid air cells appear clear. Soft tissues: Negative. CT CERVICAL SPINE FINDINGS Alignment: There is trace anterolisthesis at C4-C5 which is favored is degenerative. Alignment is otherwise anatomic. Skull base and vertebrae: No acute fracture. No primary bone lesion or focal pathologic process. Soft tissues and spinal canal: No prevertebral fluid or swelling. No visible canal hematoma. Disc levels: There is disc space narrowing throughout the cervical spine most significant at C3-C4, C5-C6 and C6-C7 compatible with degenerative change. Facet arthropathy is seen bilaterally. There is no severe central canal or neural foraminal stenosis. Upper chest: Negative. Other: None IMPRESSION: 1. No acute intracranial process. 2. No acute fracture or traumatic subluxation of cervical spine. 3. No acute facial fracture. 4. Mild progression of chronic small vessel ischemic change in the brain. 5. Old left orbital wall fractures. Electronically Signed   By: Ronney Asters M.D.   On: 02/03/2021 17:39    Anti-infectives: Anti-infectives (From admission, onward)    None        Assessment/Plan GLF Concussion - CT head negative. ST eval L facial contusion and ecchymoses - ice L facial lacs and abrasion- s/p repair EDPA 10/4. Bacitracin BID. Plan to d/c suture ~10/11 L rib FX 3-8 with extrapleural hematoma - f/u CXR atelectasis, small left pleural effusion, no PNX. Pulmonary toilet, mulltimodal pain control  Density in R middle lung lobe - incidental on CT, needs outpatient follow  up Anticoagulation with Eliquis for DVT/PE - Hgb stable start eliquis HTN - home meds HLD  ID - none. Ucx pending FEN - reg diet VTE -  SCDs, eliquis Foley - I&O cath x1, UOP 900cc yesterday, voiding on his own today per wife  Plan - TBI team therapies.    LOS: 2 days    Henreitta Cea, [facility] 02/05/2021, 11:43 AM Please see Amion for pager number during day hours 7:00am-4:30pm

## 2021-02-06 LAB — CBC
HCT: 37.1 % — ABNORMAL LOW (ref 39.0–52.0)
Hemoglobin: 12.5 g/dL — ABNORMAL LOW (ref 13.0–17.0)
MCH: 33.1 pg (ref 26.0–34.0)
MCHC: 33.7 g/dL (ref 30.0–36.0)
MCV: 98.1 fL (ref 80.0–100.0)
Platelets: 130 10*3/uL — ABNORMAL LOW (ref 150–400)
RBC: 3.78 MIL/uL — ABNORMAL LOW (ref 4.22–5.81)
RDW: 12.5 % (ref 11.5–15.5)
WBC: 6.8 10*3/uL (ref 4.0–10.5)
nRBC: 0 % (ref 0.0–0.2)

## 2021-02-06 LAB — BASIC METABOLIC PANEL
Anion gap: 5 (ref 5–15)
BUN: 27 mg/dL — ABNORMAL HIGH (ref 8–23)
CO2: 26 mmol/L (ref 22–32)
Calcium: 8.6 mg/dL — ABNORMAL LOW (ref 8.9–10.3)
Chloride: 104 mmol/L (ref 98–111)
Creatinine, Ser: 1.26 mg/dL — ABNORMAL HIGH (ref 0.61–1.24)
GFR, Estimated: 55 mL/min — ABNORMAL LOW (ref >60)
Glucose, Bld: 117 mg/dL — ABNORMAL HIGH (ref 70–99)
Potassium: 4 mmol/L (ref 3.5–5.1)
Sodium: 135 mmol/L (ref 135–145)

## 2021-02-06 LAB — URINE CULTURE

## 2021-02-06 NOTE — Progress Notes (Signed)
Inpatient Rehab Admissions Coordinator :  Per therapy recommendations, patient was screened for CIR candidacy by Danne Baxter RN MSN. Noted SNF vs CIR recommended for patient lives at [facility] . Need to assess rehab venue options of CIR vs SNF and which he would benefit from the most and discuss with family on their preferences.  At this time patient appears to be a potential candidate for CIR. I will place a rehab consult per protocol for full assessment. Please call me with any questions.  Danne Baxter RN MSN Admissions Coordinator [phone]

## 2021-02-06 NOTE — Progress Notes (Signed)
Patient ID: Brandon Burke, male   DOB: 03-May-1932, 87 y.o.   MRN: [id number] [facility] Surgery Progress Note     Subjective: CC-  No complaints this morning. Denies any pain. States that he slept well and has not had breakfast yet this morning. No family at bedside.  Objective: Vital signs in last 24 hours: Temp:  [97.9 F (36.6 C)-98.9 F (37.2 C)] 97.9 F (36.6 C) (10/07 0747) Pulse Rate:  [63-79] 72 (10/07 0747) Resp:  [18] 18 (10/07 0747) BP: (107-127)/(41-62) 114/51 (10/07 0747) SpO2:  [94 %-99 %] 99 % (10/07 0747)    Intake/Output from previous day: 10/06 0701 - 10/07 0700 In: 1083 [P.O.:1080; I.V.:3] Out: 1025 [Urine:1025] Intake/Output this shift: No intake/output data recorded.  PE: Gen:  Alert, NAD, pleasant HEENT: R EOM's intact. L eye edema and periorbital ecchymosis improving. Left supraorbital abrasion and laceration without active bleeding or signs of infection, sutures intact Card:  RRR, no M/G/R heard, 2+ DP pulses Pulm:  CTAB, no W/R/R, rate and effort normal on RA, pulling 1000 on IS for me today Abd: soft, protuberant, NT, +BS, no HSM, umbilical hernia small and reducible Ext:  no BUE/BLE edema, calves soft and nontender Psych: Alert, oriented to self, states that he is at school and the year is 2020  GU: foley in place with clear yellow urine Skin: no rashes noted, warm and dry  Lab Results:  Recent Labs    02/05/21 0135 02/06/21 0129  WBC 11.4* 6.8  HGB 12.8* 12.5*  HCT 37.5* 37.1*  PLT 138* 130*   BMET Recent Labs    02/04/21 0326 02/06/21 0129  NA 136 135  K 4.5 4.0  CL 103 104  CO2 23 26  GLUCOSE 131* 117*  BUN 19 27*  CREATININE 1.19 1.26*  CALCIUM 9.3 8.6*   PT/INR Recent Labs    02/03/21 1608  LABPROT 14.4  INR 1.1   CMP     Component Value Date/Time   NA 135 02/06/2021 0129   K 4.0 02/06/2021 0129   CL 104 02/06/2021 0129   CO2 26 02/06/2021 0129   GLUCOSE 117 (H) 02/06/2021 0129   BUN 27 (H)  02/06/2021 0129   CREATININE 1.26 (H) 02/06/2021 0129   CALCIUM 8.6 (L) 02/06/2021 0129   PROT 6.6 02/03/2021 1608   ALBUMIN 3.5 02/03/2021 1608   AST 26 02/03/2021 1608   ALT 28 02/03/2021 1608   ALKPHOS 70 02/03/2021 1608   BILITOT 0.6 02/03/2021 1608   GFRNONAA 55 (L) 02/06/2021 0129   Lipase  No results found for: LIPASE     Studies/Results: No results found.  Anti-infectives: Anti-infectives (From admission, onward)    None        Assessment/Plan GLF Concussion - CT head negative. ST eval L facial contusion and ecchymoses - ice L facial lacs and abrasion- s/p repair EDPA 10/4. Bacitracin BID. Plan to d/c suture ~10/11 L rib FX 3-8 with extrapleural hematoma - f/u CXR atelectasis, small left pleural effusion, no PNX. Pulmonary toilet, mulltimodal pain control  Density in R middle lung lobe - incidental on CT, needs outpatient follow up Anticoagulation with Eliquis for DVT/PE - continue eliquis, hgb stable HTN - home meds HLD   ID - none. Ucx with multiple species, repeat Ucx pending FEN - reg diet VTE - SCDs, eliquis Foley - placed 10/6 for retention, continue flomax and plan TOV 10/8   Plan - TBI team therapies. Dispo pending progress (SNF vs CIR).  LOS: 3 days    [place] [facility] 02/06/2021, 8:43 AM Please see Amion for pager number during day hours 7:00am-4:30pm

## 2021-02-06 NOTE — Care Management Important Message (Signed)
Important Message  Patient Details  Name: Brandon Burke MRN: [id number] Date of Birth: Apr 04, 1932   Medicare Important Message Given:  Yes     Orbie Pyo 02/06/2021, 3:59 PM

## 2021-02-06 NOTE — Progress Notes (Signed)
Speech Language Pathology Treatment: Cognitive-Linquistic  Patient Details Name: Brandon Burke MRN: [id number] DOB: 1931-08-10 Today's Date: 02/06/2021 Time: 8315-1761 SLP Time Calculation (min) (ACUTE ONLY): 24 min  Assessment / Plan / Recommendation Clinical Impression  Pt was seen for cognitive-linguistic treatment with his wife present. He perseverated on his eye injury and most responses ultimately evolved to include this conversational topic. Pt achieved 50% accuracy with simple concrete reasoning when verbal prompts and phonemic cues were provided. He completed an auditory attention task with 40% accuracy. He demonstrated immediate recall of visually-presented images with 75% accuracy increasing to 100% with semantic cues. Pt's wife was educated on pt's performance and on the impact of his impaired attention on aspects of cognition, language, and communication. She verbalized understanding and voiced agreement with the need for continued need for treatment after discharge. SLP will continue to follow pt.     HPI HPI: 87yo M admitted 10/4 after going to the barber when he had an unwitnessed fall. Pt with concussion, multiple contusions, and left 3-8 rib fxs with hematoma.  PMHx DVT/PE on Eliquis, prostate CA, HTN, and MVP. PLOF very independent per spouse.      SLP Plan  Continue with current plan of care      Recommendations for follow up therapy are one component of a multi-disciplinary discharge planning process, led by the attending physician.  Recommendations may be updated based on patient status, additional functional criteria and insurance authorization.    Recommendations                   Follow up Recommendations: Inpatient Rehab SLP Visit Diagnosis: Cognitive communication deficit (Y07.371) Plan: Continue with current plan of care       Malessa Zartman I. Hardin Negus, [place], [place] Office number [phone] Pager [place]  02/06/2021, 3:10 PM

## 2021-02-06 NOTE — TOC Initial Note (Addendum)
Transition of Care [facility]) - Initial/Assessment Note    Patient Details  Name: Brandon Burke MRN: [id number] Date of Birth: 1931/07/18  Transition of Care [facility]) CM/SW Contact:    Ella Bodo, RN Phone Number: 02/06/2021, 3:14 PM  Clinical Narrative:                 87yo M admitted 10/4 after going to the bank when he had an unwitnessed fall. Pt with concussion, multiple contusions, and left 3-8 rib fxs with hematoma.  Prior to admission, patient independent and living at [facility] with his wife.  PT/OT recommending SNF vs. CIR; left message for patient's wife to discuss discharge planning and preferences for disposition.  Addendum: 5732: Received call from Raelyn Mora with House; she states that SNF rehab no longer done at [facility], and is now done at [facility].  Awaiting call back from spouse to see if she wants SNF rehab or CIR eval.  Will follow up with family on choice; plan to follow up with Raelyn Mora on Monday regarding disposition, phone # [phone].    Expected Discharge Plan: Skilled Nursing Facility Barriers to Discharge: Continued Medical Work up        Expected Discharge Plan and Services Expected Discharge Plan: [place]   Discharge Planning Services: CM Consult   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Apartment                                      Prior Living Arrangements/Services 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Apartment Lives with:: Spouse Patient language and need for interpreter reviewed:: Yes Do you feel safe going back to the place where you live?: Yes      Need for Family Participation in Patient Care: Yes (Comment) Care giver support system in place?: Yes (comment)   Criminal Activity/Legal Involvement Pertinent to Current Situation/Hospitalization: No - Comment as needed               Emotional Assessment Appearance:: Appears stated age Attitude/Demeanor/Rapport:  Engaged Affect (typically observed): Accepting Orientation: : Oriented to Self      Admission diagnosis:  Trauma [T14.90XA] LOC (loss of consciousness) ([place]) [R40.20] Abrasion [T14.8XXA] Multiple rib fractures [S22.49XA] Injury of head, initial encounter [S09.90XA] Fall, initial encounter [W19.XXXA] Forehead laceration, initial encounter [S01.81XA] Closed fracture of multiple ribs of left side, initial encounter [S22.42XA] Contusion of left lung, initial encounter [B76.283T] Mass of right lung [R91.8] Multiple fractures of ribs, left side, initial encounter for closed fracture [S22.42XA] Patient Active Problem List   Diagnosis Date Noted   Multiple fractures of ribs, left side, initial encounter for closed fracture 02/03/2021   PCP:  Colon Branch, MD Pharmacy:   [id number] - [place], [place] [address] Phone: [phone] Fax: [phone]     Social Determinants of Health (SDOH) Interventions    Readmission Risk Interventions No flowsheet data found.  Reinaldo Raddle, RN, BSN  Trauma/Neuro ICU Case Manager [phone]

## 2021-02-07 LAB — BASIC METABOLIC PANEL
Anion gap: 4 — ABNORMAL LOW (ref 5–15)
BUN: 18 mg/dL (ref 8–23)
CO2: 28 mmol/L (ref 22–32)
Calcium: 8.5 mg/dL — ABNORMAL LOW (ref 8.9–10.3)
Chloride: 105 mmol/L (ref 98–111)
Creatinine, Ser: 1.26 mg/dL — ABNORMAL HIGH (ref 0.61–1.24)
GFR, Estimated: 55 mL/min — ABNORMAL LOW (ref >60)
Glucose, Bld: 112 mg/dL — ABNORMAL HIGH (ref 70–99)
Potassium: 3.8 mmol/L (ref 3.5–5.1)
Sodium: 137 mmol/L (ref 135–145)

## 2021-02-07 MED ORDER — ALPRAZOLAM 0.25 MG PO TABS
0.2500 mg | ORAL_TABLET | Freq: Two times a day (BID) | ORAL | Status: DC | PRN
  Administered 2021-02-07 – 2021-02-09 (×3): 0.25 mg via ORAL
  Filled 2021-02-07 (×3): qty 1

## 2021-02-07 NOTE — PMR Pre-admission (Signed)
PMR Admission Coordinator Pre-Admission Assessment  Patient: Brandon Burke is an 87 y.o., male MRN: [id number] DOB: March 24, 1932 Height: 6' (182.9 cm) Weight: 79.4 kg  Insurance Information HMO:     PPO: yes     PCP:      IPA:      80/20:      OTHER:  PRIMARY: [employer]      Policy#: [id number]      Subscriber: patient CM Name: Marlowe Kays      Phone#: [phone]     Fax#: [phone] Pre-Cert#: [id number] approved for 7 days Employer:  Benefits:  Phone #: [phone]     Name:  Eff. Date: 05/04/19-still active     Deduct: no deductible      Out of Pocket Max: $3,450 ($484.31 met)      Life Max: NA CIR: $325/day co-pay for days 1-6, $0/day days 7-90      SNF: 100% coverage days 1-20, $184/day co-pay for days 21-100; limited to 100 days/benefit period Outpatient: $30/visit co-pay     Co-Pay:  Home Health: 100% coverage      Co-Pay:  DME: 80%     Co-Pay: 20% co-insurance Providers: in-network SECONDARY:      Policy#:      Phone#:   Development worker, community:       Phone#:   [employer] Engineer, [employer]" for patients in Inpatient Rehabilitation Facilities with attached "Privacy Act [place] Records" was provided and verbally reviewed with: Family  Emergency Contact Information Contact Information     Name Relation Home Work Mobile   [place] Spouse   [phone]   Dalessandro, [place]   [phone]      Current Medical History  Patient Admitting Diagnosis: Debility d/t multiple rib fx  History of Present Illness:  87 year old right-handed male with history of prostate cancer, 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  Per chart review patient lives with spouse at [facility] independent living facility.  Independent prior to admission.  Presented 02/03/2021 after unwitnessed fall.  Unknown loss of consciousness.  He was amnestic to the event.  Cranial CT scan of the head, maxillofacial and cervical spine showed no acute  intracranial process.  No acute fracture or traumatic subluxation of cervical spine.  No acute facial fracture.  There was noted old left orbital wall fractures.  CT of the chest abdomen pelvis showed acute posterior left third through eighth rib fractures.  Adjacent pleural thickening/hematoma.  No pneumothorax.  Left lower patchy airspace disease worrisome for infection.  Pulmonary contusion not excluded.  No acute localizing process in the abdomen or pelvis.  There was an elongated lobulated density in the right middle lobe increased in size since prior comparison plan outpatient follow-up.  Patient did sustain left facial laceration and abrasion with repair sutures in the ED.  Admission chemistries unremarkable except glucose 128, creatinine 1.30, alcohol negative, lactic acid 2.0.  Urine culture greater 100,000 Staphylococcus epidermidis.  Patient was cleared to continue chronic Eliquis.  Patient did have some urinary retention initially with Foley tube but patient pulled out planning voiding trial.  Speech therapy continues to follow for cognitive deficits related to fall impacting memory and problem-solving.  Maintain on a regular diet.    Patient's medical record from [facility] has been reviewed by the rehabilitation admission coordinator and physician.  Past Medical History  History reviewed. No pertinent past medical history.  Has the patient had major surgery during 100 days prior to admission? No  Family History  family history is not on file.  Current Medications  Current Facility-Administered Medications:    0.9 %  sodium chloride infusion, 250 mL, Intravenous, PRN, Georganna Skeans, MD   acetaminophen (TYLENOL) tablet 650 mg, 650 mg, Oral, Q6H, Meuth, Brooke A, PA-C, 650 mg at 02/11/21 8413   apixaban (ELIQUIS) tablet 2.5 mg, 2.5 mg, Oral, BID, Meuth, Brooke A, PA-C, 2.5 mg at 02/11/21 0841   atenolol (TENORMIN) tablet 50 mg, 50 mg, Oral, Daily, Georganna Skeans, MD, 50 mg  at 02/11/21 0841   bacitracin ointment, , Topical, BID, Meuth, Brooke A, PA-C, Given at 02/11/21 2440   bisacodyl (DULCOLAX) suppository 10 mg, 10 mg, Rectal, Daily PRN, Armandina Gemma, MD   calcium carbonate (TUMS - dosed in mg elemental calcium) chewable tablet 200 mg of elemental calcium, 1 tablet, Oral, PRN, Meuth, Brooke A, PA-C   Chlorhexidine Gluconate Cloth 2 % PADS 6 each, 6 each, Topical, Daily, Armandina Gemma, MD, 6 each at 02/11/21 0845   dextromethorphan-guaiFENesin ([place] DM) 30-600 MG per 12 hr tablet 1 tablet, 1 tablet, Oral, BID, Meuth, Brooke A, PA-C, 1 tablet at 02/11/21 0842   docusate sodium (COLACE) capsule 100 mg, 100 mg, Oral, BID, Meuth, Brooke A, PA-C, 100 mg at 02/11/21 0841   dorzolamide-timolol (COSOPT) 22.3-6.8 MG/ML ophthalmic solution 1 drop, 1 drop, Both Eyes, BID, Meuth, Brooke A, PA-C, 1 drop at 02/11/21 0843   fluticasone (FLONASE) 50 MCG/ACT nasal spray 1 spray, 1 spray, Each Nare, Daily, Meuth, Brooke A, PA-C, 1 spray at 02/11/21 0843   hydrALAZINE (APRESOLINE) tablet 10 mg, 10 mg, Oral, Q6H PRN, Georganna Skeans, MD   latanoprost (XALATAN) 0.005 % ophthalmic solution 1 drop, 1 drop, Both Eyes, QHS, Meuth, Brooke A, PA-C, 1 drop at 02/10/21 2128   levothyroxine (SYNTHROID) tablet 25 mcg, 25 mcg, Oral, BID, Georganna Skeans, MD, 25 mcg at 02/11/21 0621   lidocaine (LIDODERM) 5 % 1 patch, 1 patch, Transdermal, Q24H, Meuth, Brooke A, PA-C, 1 patch at 02/11/21 0840   methocarbamol (ROBAXIN) tablet 500 mg, 500 mg, Oral, Q8H PRN, Georganna Skeans, MD, 500 mg at 02/10/21 2348   ondansetron (ZOFRAN-ODT) disintegrating tablet 4 mg, 4 mg, Oral, Q6H PRN **OR** ondansetron (ZOFRAN) injection 4 mg, 4 mg, Intravenous, Q6H PRN, Georganna Skeans, MD   polyethylene glycol (MIRALAX / GLYCOLAX) packet 17 g, 17 g, Oral, Daily, Meuth, Brooke A, PA-C, 17 g at 02/11/21 0842   pravastatin (PRAVACHOL) tablet 40 mg, 40 mg, Oral, QHS, Meuth, Brooke A, PA-C, 40 mg at 02/10/21 2126   sodium  chloride flush (NS) 0.9 % injection 3 mL, 3 mL, Intravenous, Q12H, Georganna Skeans, MD, 3 mL at 02/11/21 0843   sodium chloride flush (NS) 0.9 % injection 3 mL, 3 mL, Intravenous, PRN, Georganna Skeans, MD   tamsulosin [facility]) capsule 0.4 mg, 0.4 mg, Oral, QPC supper, Meuth, Brooke A, PA-C, 0.4 mg at 02/10/21 1704   traMADol (ULTRAM) tablet 50 mg, 50 mg, Oral, Q6H PRN, Meuth, Brooke A, PA-C, 50 mg at 02/10/21 2348  Patients Current Diet:  Diet Order             Diet regular Room service appropriate? Yes; Fluid consistency: Thin  Diet effective now                  Precautions / Restrictions Precautions Precautions: Fall Restrictions Weight Bearing Restrictions: No   Has the patient had 2 or more falls or a fall with injury in the past year? Yes  Prior Activity Level Limited  Community (1-2x/wk): driving, gets out of house 2-3 days/week  Prior Functional Level Self Care: Did the patient need help bathing, dressing, using the toilet or eating? Independent  Indoor Mobility: Did the patient need assistance with walking from room to room (with or without device)? Independent  Stairs: Did the patient need assistance with internal or external stairs (with or without device)? Independent  Functional Cognition: Did the patient need help planning regular tasks such as shopping or remembering to take medications? Needed some help  Patient Information Are you of Hispanic, Latino/a,or Spanish origin?: A. No, not of Hispanic, Latino/a, or Spanish origin What is your race?: A. White Do you need or want an interpreter to communicate with a doctor or health care staff?: 0. No  Patient's Response To:  Health Literacy and Transportation Is the patient able to respond to health literacy and transportation needs?: Yes Health Literacy - How often do you need to have someone help you when you read instructions, pamphlets, or other written material from your doctor or pharmacy?: Never In the  past 12 months, has lack of transportation kept you from medical appointments or from getting medications?: No In the past 12 months, has lack of transportation kept you from meetings, work, or from getting things needed for daily living?: No  Development worker, international aid / Equipment Home Equipment: Shower seat, Grab bars - tub/shower, Hand held shower head, Walker - 2 wheels, Crutches  Prior Device Use: Indicate devices/aids used by the patient prior to current illness, exacerbation or injury? None of the above  Current Functional Level Cognition  Arousal/Alertness: Awake/alert Overall Cognitive Status: Impaired/Different from baseline Orientation Level: Oriented to person, Disoriented to place, Disoriented to time, Disoriented to situation Following Commands: Follows one step commands inconsistently, Follows one step commands with increased time Safety/Judgement: Decreased awareness of safety, Decreased awareness of deficits General Comments: Responding to questions. As session progressed, pt becoming mroe fatigued and pt talking less. Attention: Focused, Sustained Focused Attention: Impaired Sustained Attention: Impaired Memory: Impaired Memory Impairment: Decreased recall of new information, Decreased short term memory Awareness: Impaired Problem Solving: Impaired Executive Function: Organizing, Self Monitoring Organizing: Impaired Self Monitoring: Impaired Safety/Judgment: Impaired [employer] Scales of Cognitive Functioning: Confused/appropriate    Extremity Assessment (includes Sensation/Coordination)  Upper Extremity Assessment: RUE deficits/detail RUE Deficits / Details: limited ROM due to pain at RUE  Lower Extremity Assessment: Generalized weakness    ADLs  Overall ADL's : Needs assistance/impaired Eating/Feeding: Set up, Sitting Eating/Feeding Details (indicate cue type and reason): Family reports he was Grooming: Minimal assistance, Sitting Upper Body Bathing:  Moderate assistance, Sitting Lower Body Bathing: Moderate assistance, Sit to/from stand Upper Body Dressing : Moderate assistance, Sitting Lower Body Dressing: Maximal assistance, Sit to/from stand Lower Body Dressing Details (indicate cue type and reason): Family reports that pt was able to perform figure four prior to accident Functional mobility during ADLs: Minimal assistance, Rolling walker General ADL Comments: Pt limited by pain and presenting cognitive deficits    Mobility  Overal bed mobility: Needs Assistance Bed Mobility: Rolling, Sidelying to Sit Rolling: Mod assist Sidelying to sit: Mod assist Supine to sit: Mod assist General bed mobility comments: Use of log roll technique to reduce pain. Rolling to R  Mod A for rolling and then to bring BLEs over EOB and elevate trunk. Pt needs incr cues for transitions and it is difficult for him to follow cues.    Transfers  Overall transfer level: Needs assistance Equipment used: Rolling walker (2 wheeled)  Transfers: Sit to/from Stand, Risk manager Sit to Stand: From elevated surface, Min assist, +2 safety/equipment, Mod assist Stand pivot transfers: Max assist, +2 safety/equipment General transfer comment: Min A for power up from elevated surface. Noting forward bend of trunk (possibly due to pain). Pt takes incr time to stand all the way to RW.  Incr cues during transitions. Mod assist from stand to sit as pt needs cues to get squared to chair and mod assist to reach back for chair as pt not appearing to process even with max cues needing tactile assist.    Ambulation / Gait / Stairs / Wheelchair Mobility  Ambulation/Gait Ambulation/Gait assistance: Min assist, +2 safety/equipment Gait Distance (Feet): 250 Feet Assistive device: Rolling walker (2 wheeled) Gait Pattern/deviations: Step-through pattern, Decreased stride length, Decreased step length - right, Decreased step length - left, Trunk flexed General Gait Details: Pt  was able to ambulate with min assist overall.  Occasional cues to stand tall and look up.  Did have 2 LOB with turns needing assist to steady. Gait velocity interpretation: <1.31 ft/sec, indicative of household ambulator    Posture / Balance Dynamic Sitting Balance Sitting balance - Comments: relies on UE support and took incr time for pt to get his balance upon initial sitting. Balance Overall balance assessment: Needs assistance Sitting-balance support: Bilateral upper extremity supported, Feet supported Sitting balance-Leahy Scale: Fair Sitting balance - Comments: relies on UE support and took incr time for pt to get his balance upon initial sitting. Standing balance support: Bilateral upper extremity supported, During functional activity Standing balance-Leahy Scale: Poor Standing balance comment: relies on UE support as well as external support    Special needs/care consideration Skin Abrasion: eye, face, hand, knee/right,left; Ecchymosis; Bladder incontinence, Urethral catheter   Previous Home Environment  Living Arrangements: Spouse/significant other  Lives With: Spouse Available Help at Discharge: Family, Available 24 hours/day Type of Home: Independent living facility Home Layout: One level Home Access: Level entry Bathroom Shower/Tub: Multimedia programmer: Standard Bathroom Accessibility: Yes How Accessible: Accessible via wheelchair, Accessible via walker [place]: No  Discharge Living Setting Plans for Discharge Living Setting: Patient's home Type of Home at Discharge: [place] Name at Discharge: [facility] Discharge Home Layout: One level Discharge Home Access: Level entry Discharge Bathroom Shower/Tub: Walk-in shower Discharge Bathroom Toilet: Standard Discharge Bathroom Accessibility: Yes How Accessible: Accessible via wheelchair, Accessible via walker Does the patient have any problems obtaining your  medications?: No  Social/Family/Support Systems Anticipated Caregiver: Ossie Beltran, wife Anticipated Caregiver's Contact Information: [phone] Caregiver Availability: 24/7 Discharge Plan Discussed with Primary Caregiver: Yes Is Caregiver In Agreement with Plan?: Yes Does Caregiver/Family have Issues with Lodging/Transportation while Pt is in Rehab?: No  Goals Patient/Family Goal for Rehab: supervision to light min guard PT, OT and SLP Expected length of stay: ELOS 10 to 14 days Pt/Family Agrees to Admission and willing to participate: Yes Program Orientation Provided & Reviewed with Pt/Caregiver Including Roles  & Responsibilities: Yes  Decrease burden of Care through IP rehab admission: NA  Possible need for SNF placement upon discharge: Not anticipated  Patient Condition: I have reviewed medical records from [facility], spoken with CM, and patient and spouse. I met with patient at the bedside for inpatient rehabilitation assessment.  Patient will benefit from ongoing PT, OT, and SLP, can actively participate in 3 hours of therapy a day 5 days of the week, and can make measurable gains during the admission.  Patient will also benefit from the coordinated team approach during an Inpatient Acute Rehabilitation admission.  The patient will receive intensive therapy as well as Rehabilitation physician, nursing, social worker, and care management interventions.  Due to bladder management, safety, skin/wound care, disease management, medication administration, pain management, and patient education the patient requires 24 hour a day rehabilitation nursing.  The patient is currently min to mod assist overall with mobility and basic ADLs.  Discharge setting and therapy post discharge at  [facility], [facility] with [facility]  is anticipated.  Patient has agreed to participate in the Acute Inpatient Rehabilitation Program and will admit today.  Preadmission Screen  Completed By:  Bethel Born, 02/11/2021 11:38 AM ______________________________________________________________________   Discussed status with Dr. Posey Pronto on  02/11/2021 at 1138 and received approval for admission today.  Admission Coordinator:  Bethel Born, [place], time  0721 Date 02/11/2021   Assessment/Plan: Diagnosis: Debility  Does the need for close, 24 hr/day Medical supervision in concert with the patient's rehab needs make it unreasonable for this patient to be served in a less intensive setting? 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 Yes Does the patient require coordinated care of a physician, rehab nurse, PT, OT to address physical and functional deficits in the context of the above medical diagnosis(es)? Yes Addressing deficits in the following areas: balance, endurance, locomotion, strength, transferring, bathing, dressing, toileting, and psychosocial support Can the patient actively participate in an intensive therapy program of at least 3 hrs of therapy 5 days a week? Yes The potential for patient to make measurable gains while on inpatient rehab is good Anticipated functional outcomes upon discharge from inpatient rehab: modified independent and supervision PT, modified independent and supervision OT, modified independent and supervision SLP Estimated rehab length of stay to reach the above functional goals is: 8-12 days. Anticipated discharge destination: Home 10. Overall Rehab/Functional Prognosis: good   MD Signature: Delice Lesch, MD, ABPMR

## 2021-02-07 NOTE — Progress Notes (Signed)
    Assessment & Plan: GLF Concussion - CT head negative. ST eval L facial contusion and ecchymoses L facial lacs and abrasion- s/p repair EDPA 10/4. Bacitracin BID. Plan to d/c suture ~10/11 L rib FX 3-8 with extrapleural hematoma - f/u CXR atelectasis, small left pleural effusion, no PNX. Pulmonary toilet, mulltimodal pain control  Density in R middle lung lobe - incidental on CT, needs outpatient follow up Anticoagulation with Eliquis for DVT/PE - continue eliquis, hgb stable HTN - home meds HLD   ID - none. Ucx with multiple species reported 10/6 culture, repeat Ucx ordered today FEN - reg diet VTE - SCDs, eliquis Foley - placed 10/6 for retention, continue flomax; pulled Foley out last PM - will leave today and send repeat urine culture from Foley   Plan - TBI team therapies. Dispo pending progress (SNF vs CIR).         Armandina Gemma, MD       [facility]       Office: [phone]   Chief Complaint: Fall on level ground  Subjective: Patient in bed, nursing at bedside.  No complaints.  Pulled Foley out last night - replaced.  Objective: Vital signs in last 24 hours: Temp:  [97.5 F (36.4 C)-98.4 F (36.9 C)] 97.5 F (36.4 C) (10/08 0714) Pulse Rate:  [63-76] 63 (10/08 0714) Resp:  [18] 18 (10/08 0714) BP: (139-167)/(60-72) 142/60 (10/08 0714) SpO2:  [84 %-98 %] 98 % (10/08 0714) Last BM Date:  (UTA - pt is confused)  Intake/Output from previous day: 10/07 0701 - 10/08 0700 In: 840 [P.O.:840] Out: 2100 [Urine:2100] Intake/Output this shift: No intake/output data recorded.  Physical Exam: HEENT - sclerae clear, mucous membranes moist; ecchymosis around left eye stable Neck - soft Chest - clear bilaterally Abdomen - slight distension; non-tender Ext - no edema, non-tender  Lab Results:  Recent Labs    02/05/21 0135 02/06/21 0129  WBC 11.4* 6.8  HGB 12.8* 12.5*  HCT 37.5* 37.1*  PLT 138* 130*   BMET Recent Labs    02/06/21 0129  02/07/21 0346  NA 135 137  K 4.0 3.8  CL 104 105  CO2 26 28  GLUCOSE 117* 112*  BUN 27* 18  CREATININE 1.26* 1.26*  CALCIUM 8.6* 8.5*   PT/INR No results for input(s): LABPROT, INR in the last 72 hours. Comprehensive Metabolic Panel:    Component Value Date/Time   NA 137 02/07/2021 0346   NA 135 02/06/2021 0129   K 3.8 02/07/2021 0346   K 4.0 02/06/2021 0129   CL 105 02/07/2021 0346   CL 104 02/06/2021 0129   CO2 28 02/07/2021 0346   CO2 26 02/06/2021 0129   BUN 18 02/07/2021 0346   BUN 27 (H) 02/06/2021 0129   CREATININE 1.26 (H) 02/07/2021 0346   CREATININE 1.26 (H) 02/06/2021 0129   GLUCOSE 112 (H) 02/07/2021 0346   GLUCOSE 117 (H) 02/06/2021 0129   CALCIUM 8.5 (L) 02/07/2021 0346   CALCIUM 8.6 (L) 02/06/2021 0129   AST 26 02/03/2021 1608   ALT 28 02/03/2021 1608   ALKPHOS 70 02/03/2021 1608   BILITOT 0.6 02/03/2021 1608   PROT 6.6 02/03/2021 1608   ALBUMIN 3.5 02/03/2021 1608    Studies/Results: No results found.    Armandina Gemma 02/07/2021   Patient ID: Brandon Burke, male   DOB: 08/07/1931, 87 y.o.   MRN: [id number]

## 2021-02-07 NOTE — Progress Notes (Signed)
Inpatient Rehab Admissions:  Inpatient Rehab Consult received.  I met with patient and wife, Brandon Burke at the bedside for rehabilitation assessment and to discuss goals and expectations of an inpatient rehab admission.  Both acknowledged understanding of CIR goals and expectations. Both interested in pt pursuing CIR. Will continue to follow.   Signed: Gayland Curry, [place], [place] Admissions Coordinator [phone]

## 2021-02-07 NOTE — Progress Notes (Signed)
Patient is confused and very hard to redirect. He pulls out telemetry box repeatedly. He has also pulled out his foley catheter tonight. Notified Gen. Surgery on-call provider, Grandville Silos. Ok to d/c telemetry order, bladder scan and straight cath patient if needed.

## 2021-02-08 MED ORDER — CHLORHEXIDINE GLUCONATE CLOTH 2 % EX PADS
6.0000 | MEDICATED_PAD | Freq: Every day | CUTANEOUS | Status: DC
  Administered 2021-02-08 – 2021-02-11 (×4): 6 via TOPICAL

## 2021-02-08 MED ORDER — BISACODYL 10 MG RE SUPP: 10.0000 mg | Freq: Every day | RECTAL | Status: DC | PRN

## 2021-02-08 NOTE — Progress Notes (Signed)
    Assessment & Plan: GLF Concussion - CT head negative. ST eval L facial contusion and ecchymoses L facial lacs and abrasion- s/p repair EDPA 10/4. Bacitracin BID. Plan to d/c suture ~10/11 L rib FX 3-8 with extrapleural hematoma - f/u CXR atelectasis, small left pleural effusion, no PNX. Pulmonary toilet, mulltimodal pain control  Density in R middle lung lobe - incidental on CT, needs outpatient follow up Anticoagulation with Eliquis for DVT/PE - continue eliquis, hgb stable HTN - home meds HLD   ID - none. Ucx with multiple species reported 10/6 culture, repeat Ucx 10/8 pending results FEN - reg diet VTE - SCDs, eliquis Foley - pulled out last PM by patient; I&O prn today, bladder scan requested  Confusion at night with repeated pulling out Foley cath.  Will leave out and bladder scan, I&O cath as needed.  May have Dulcolax supp for constipation as needed.   Plan - TBI team therapies. Disposition pending - rehab eval ongoing.        Armandina Gemma, MD       [facility]       Office: [phone]   Chief Complaint: Fall  Subjective: Patient in bed, comfortable, wife at bedside.  Confusion at night, pulled Foley out again.  Objective: Vital signs in last 24 hours: Temp:  [98 F (36.7 C)-98.5 F (36.9 C)] 98 F (36.7 C) (10/09 0535) Pulse Rate:  [75-87] 75 (10/09 0535) Resp:  [17] 17 (10/09 0535) BP: (119-160)/(56-83) 130/59 (10/09 0535) SpO2:  [95 %-99 %] 99 % (10/09 0535) Last BM Date:  (UTA - pt is confused)  Intake/Output from previous day: 10/08 0701 - 10/09 0700 In: 360 [P.O.:360] Out: 800 [Urine:800] Intake/Output this shift: No intake/output data recorded.  Physical Exam: HEENT - sclerae clear, mucous membranes moist; abrasions left periorbital dry Neck - soft Abdomen - soft, mild distension; non-tender Ext - no edema, non-tender Neuro - alert, non-focal  Lab Results:  Recent Labs    02/06/21 0129  WBC 6.8  HGB 12.5*  HCT  37.1*  PLT 130*   BMET Recent Labs    02/06/21 0129 02/07/21 0346  NA 135 137  K 4.0 3.8  CL 104 105  CO2 26 28  GLUCOSE 117* 112*  BUN 27* 18  CREATININE 1.26* 1.26*  CALCIUM 8.6* 8.5*   PT/INR No results for input(s): LABPROT, INR in the last 72 hours. Comprehensive Metabolic Panel:    Component Value Date/Time   NA 137 02/07/2021 0346   NA 135 02/06/2021 0129   K 3.8 02/07/2021 0346   K 4.0 02/06/2021 0129   CL 105 02/07/2021 0346   CL 104 02/06/2021 0129   CO2 28 02/07/2021 0346   CO2 26 02/06/2021 0129   BUN 18 02/07/2021 0346   BUN 27 (H) 02/06/2021 0129   CREATININE 1.26 (H) 02/07/2021 0346   CREATININE 1.26 (H) 02/06/2021 0129   GLUCOSE 112 (H) 02/07/2021 0346   GLUCOSE 117 (H) 02/06/2021 0129   CALCIUM 8.5 (L) 02/07/2021 0346   CALCIUM 8.6 (L) 02/06/2021 0129   AST 26 02/03/2021 1608   ALT 28 02/03/2021 1608   ALKPHOS 70 02/03/2021 1608   BILITOT 0.6 02/03/2021 1608   PROT 6.6 02/03/2021 1608   ALBUMIN 3.5 02/03/2021 1608    Studies/Results: No results found.    Armandina Gemma 02/08/2021   Patient ID: Brandon Burke, male   DOB: 1932-03-10, 87 y.o.   MRN: [id number]

## 2021-02-09 LAB — BASIC METABOLIC PANEL
Anion gap: 10 (ref 5–15)
BUN: 20 mg/dL (ref 8–23)
CO2: 23 mmol/L (ref 22–32)
Calcium: 8.8 mg/dL — ABNORMAL LOW (ref 8.9–10.3)
Chloride: 105 mmol/L (ref 98–111)
Creatinine, Ser: 1.07 mg/dL (ref 0.61–1.24)
GFR, Estimated: >60 mL/min (ref >60)
Glucose, Bld: 123 mg/dL — ABNORMAL HIGH (ref 70–99)
Potassium: 4 mmol/L (ref 3.5–5.1)
Sodium: 138 mmol/L (ref 135–145)

## 2021-02-09 LAB — CBC
HCT: 40.7 % (ref 39.0–52.0)
Hemoglobin: 13.9 g/dL (ref 13.0–17.0)
MCH: 33.1 pg (ref 26.0–34.0)
MCHC: 34.2 g/dL (ref 30.0–36.0)
MCV: 96.9 fL (ref 80.0–100.0)
Platelets: 198 10*3/uL (ref 150–400)
RBC: 4.2 MIL/uL — ABNORMAL LOW (ref 4.22–5.81)
RDW: 12.7 % (ref 11.5–15.5)
WBC: 8.3 10*3/uL (ref 4.0–10.5)
nRBC: 0 % (ref 0.0–0.2)

## 2021-02-09 LAB — URINE CULTURE
Culture: >=100000 — AB
Special Requests: NORMAL

## 2021-02-09 MED ORDER — INFLUENZA VAC A&B SA ADJ QUAD 0.5 ML IM PRSY
0.5000 mL | PREFILLED_SYRINGE | INTRAMUSCULAR | Status: AC
  Administered 2021-02-10: 0.5 mL via INTRAMUSCULAR
  Filled 2021-02-09: qty 0.5

## 2021-02-09 NOTE — Progress Notes (Signed)
[facility] Progress Note     Subjective: CC: generalized pain  Therapy at bedside. Foley in place.   Per note from 10/9 Dr. Harlow Asa the patient pulled his foley out overnight 24 hours ago due to confusion at night. it was replaced late 10/8   Objective: Vital signs in last 24 hours: Temp:  [97.6 F (36.4 C)-98.7 F (37.1 C)] 97.9 F (36.6 C) (10/10 0742) Pulse Rate:  [60-92] 69 (10/10 0742) Resp:  [16-20] 16 (10/10 0742) BP: (103-156)/(54-72) 148/64 (10/10 0742) SpO2:  [98 %-99 %] 98 % (10/10 0742) Last BM Date: 02/08/21  Intake/Output from previous day: 10/09 0701 - 10/10 0700 In: 740 [P.O.:740] Out: 400 [Urine:400] Intake/Output this shift: No intake/output data recorded.  PE: Gen:  Alert, NAD, cooperative HEENT: multiple abrasions over left temporal region appear clean, anicteric sclerae, neck soft and trachea midline Card:  Regular rate and rhythm, pedal pulses 2+ BL Pulm:  Normal effort, clear to auscultation bilaterally Abd: Soft, non-tender, non-distended, +BS, no HSM Skin: warm and dry, no rashes Psych: A&Ox3   Lab Results:  No results for input(s): WBC, HGB, HCT, PLT in the last 72 hours. BMET Recent Labs    02/07/21 0346  NA 137  K 3.8  CL 105  CO2 28  GLUCOSE 112*  BUN 18  CREATININE 1.26*  CALCIUM 8.5*   PT/INR No results for input(s): LABPROT, INR in the last 72 hours. CMP     Component Value Date/Time   NA 137 02/07/2021 0346   K 3.8 02/07/2021 0346   CL 105 02/07/2021 0346   CO2 28 02/07/2021 0346   GLUCOSE 112 (H) 02/07/2021 0346   BUN 18 02/07/2021 0346   CREATININE 1.26 (H) 02/07/2021 0346   CALCIUM 8.5 (L) 02/07/2021 0346   PROT 6.6 02/03/2021 1608   ALBUMIN 3.5 02/03/2021 1608   AST 26 02/03/2021 1608   ALT 28 02/03/2021 1608   ALKPHOS 70 02/03/2021 1608   BILITOT 0.6 02/03/2021 1608   GFRNONAA 55 (L) 02/07/2021 0346   Lipase  No results found for: LIPASE     Studies/Results: No results  found.  Anti-infectives: Anti-infectives (From admission, onward)    None        Assessment/Plan  GLF Concussion - CT head negative. Ongoing SLP cog therapies L facial contusion and ecchymoses L facial lacs and abrasion- s/p repair EDPA 10/4. Bacitracin BID. Plan to d/c suture ~10/11 L rib FX 3-8 with extrapleural hematoma - f/u CXR atelectasis, small left pleural effusion, no PNX. Pulmonary toilet, mulltimodal pain control  Density in R middle lung lobe - incidental on CT, needs outpatient follow up Anticoagulation with Eliquis for DVT/PE - continue eliquis, hgb stable HTN - home meds HLD   ID - none. Ucx with multiple species reported 10/6 culture, repeat Ucx 10/8 pending results FEN - reg diet VTE - SCDs, eliquis Foley - pulled out late 10/8 by patient; foley ended up being replaced despite order for I&OP caths-- plan repeat TOC tomorrow 10/11. If patient removed his own foley before this time then leave foley out and perform bladder scan q6h and I&O PRN     Plan - TBI team therapies. Disposition pending - rehab eval ongoing.  Suture removal and TOV tomorrow 10/11.    LOS: 6 days    Obie Dredge, [facility] Please see Amion for pager number during day hours 7:00am-4:30pm

## 2021-02-09 NOTE — TOC Progression Note (Signed)
Transition of Care [facility]) - Progression Note    Patient Details  Name: Brandon Burke MRN: [id number] Date of Birth: 08-23-1931  Transition of Care [facility]) CM/SW Contact  Oren Section Cleta Alberts, RN Phone Number: 02/09/2021, 3:33 PM  Clinical Narrative:    Patient wife prefer inpatient rehab admission over discharge to SNF; CIR admissions liaison has  initiated insurance authorization.  Will follow for updates.   Expected Discharge Plan: IP Rehab Facility Barriers to Discharge: Continued Medical Work up  Expected Discharge Plan and Services Expected Discharge Plan: [place]   Discharge Planning Services: CM Consult   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Apartment                                       Social Determinants of Health (SDOH) Interventions    Readmission Risk Interventions No flowsheet data found.  Reinaldo Raddle, RN, BSN  Trauma/Neuro ICU Case Manager [phone]

## 2021-02-09 NOTE — Progress Notes (Signed)
Inpatient Rehab Admissions Coordinator:  Saw pt and wife at bedside. Informed them that insurance authorization has begun. Will continue to follow.   Gayland Curry, [place], [place] Admissions Coordinator [phone]

## 2021-02-09 NOTE — Progress Notes (Signed)
Physical Therapy Treatment Patient Details Name: Brandon Burke MRN: [id number] DOB: 1931-07-22 Today's Date: 02/09/2021   History of Present Illness 87yo M admitted 10/4 after going [place] the bank when he had an unwitnessed fall. Pt with concussion, multiple contusions, and left 3-8 rib fxs with hematoma.  PMHx DVT/PE on Eliquis, prostate CA, HTN, and MVP    PT Comments    Pt admitted with above diagnosis. Pt was able to ambulate with RW with min assist and cues.  Followed pt with chair and pt did not need to sit today. Pt has most difficulty with bed mobility and transitions.  Pt has difficulty processing information therefore has periods where he "freezes" and needs incr verbal and tactile cues for safety as well as incr assist for safe movement.  Agree with CIR c/s as wife really wanted pt close to home and since that isnt an option, CIR would benefit pt to reach goals so she can care for him at home.  Pt currently with functional limitations due to balance and endurance deficits. Pt will benefit from skilled PT to increase their independence and safety with mobility to allow discharge to the venue listed below.      Recommendations for follow up therapy are one component of a multi-disciplinary discharge planning process, led by the attending physician.  Recommendations may be updated based on patient status, additional functional criteria and insurance authorization.  Follow Up Recommendations  Supervision/Assistance - 24 hour;CIR (Pt lives at [facility] and given that they dont have SNF care there now, pt and wife are interested in [place] and then back to [facility] living.)     Equipment Recommendations  None recommended by PT    Recommendations for Other Services       Precautions / Restrictions Precautions Precautions: Fall Restrictions Weight Bearing Restrictions: No     Mobility  Bed Mobility Overal bed mobility: Needs Assistance Bed Mobility: Rolling;Sidelying to  Sit Rolling: Mod assist Sidelying to sit: Mod assist Supine to sit: Mod assist     General bed mobility comments: Use of log roll technique to reduce pain. Rolling to R  Mod A for rolling and then to bring BLEs over EOB and elevate trunk.    Transfers Overall transfer level: Needs assistance Equipment used: Rolling walker (2 wheeled) Transfers: Sit to/from [facility] Sit to Stand: From elevated surface;Min assist;+2 safety/equipment;Mod assist         General transfer comment: Min A for power up from elevated surface. Noting forward bend of trunk (possibly due to pain). Pt takes incr time to stand all the way to RW.  Incr cues during transitions. Mod assist from stand to sit as pt needs cues to get squared to chair and mod assist to reach back for chair as pt not appearing to process even with max cues needing tactile assist.  Ambulation/Gait Ambulation/Gait assistance: Min assist;+2 safety/equipment Gait Distance (Feet): 250 Feet Assistive device: Rolling walker (2 wheeled) Gait Pattern/deviations: Step-through pattern;Decreased stride length;Decreased step length - right;Decreased step length - left;Trunk flexed   Gait velocity interpretation: <1.31 ft/sec, indicative of household ambulator General Gait Details: Pt was able to ambulate with min assist overall with chair follow (however pt didnt need to sit in chair to rest and made it back to room).  Occasional cues to stand tall and look up but overall did well wtih RW safety.   Stairs             Emergency planning/management officer  Modified Rankin (Stroke Patients Only)       Balance Overall balance assessment: Needs assistance Sitting-balance support: Bilateral upper extremity supported;Feet supported Sitting balance-Leahy Scale: Fair     Standing balance support: Bilateral upper extremity supported;During functional activity Standing balance-Leahy Scale: Poor Standing balance comment: relies on UE  support as well as external support                            Cognition Arousal/Alertness: Awake/alert Behavior During Therapy: Flat affect Overall Cognitive Status: Impaired/Different from baseline Area of Impairment: Orientation;Following commands;Safety/judgement;Awareness;Problem solving;Memory               Rancho Levels of Cognitive Functioning Rancho Los Amigos Scales of Cognitive Functioning: Confused/appropriate Orientation Level: Disoriented to;Time (unsure of month)   Memory: Decreased short-term memory;Decreased recall of precautions Following Commands: Follows one step commands inconsistently;Follows one step commands with increased time Safety/Judgement: Decreased awareness of safety;Decreased awareness of deficits   Problem Solving: Decreased initiation;Slow processing;Difficulty sequencing;Requires verbal cues;Requires tactile cues General Comments: Responding to questions. As session progressed, pt becoming mroe fatigued and pt talking less.      Exercises      General Comments        Pertinent Vitals/Pain Pain Assessment: No/denies pain    Home Living                      Prior Function            PT Goals (current goals can now be found in the care plan section) Acute Rehab PT Goals Patient Stated Goal: Get better and go home Progress towards PT goals: Progressing toward goals    Frequency    Min 3X/week      PT Plan Discharge plan needs to be updated    Co-evaluation              AM-PAC PT "6 Clicks" Mobility   Outcome Measure  Help needed turning from your back to your side while in a flat bed without using bedrails?: A Lot Help needed moving from lying on your back to sitting on the side of a flat bed without using bedrails?: A Lot Help needed moving to and from a bed to a chair (including a wheelchair)?: A Lot Help needed standing up from a chair using your arms (e.g., wheelchair or bedside chair)?: A  Lot Help needed to walk in hospital room?: A Little Help needed climbing 3-5 steps with a railing? : A Lot 6 Click Score: 13    End of Session Equipment Utilized During Treatment: Gait belt Activity Tolerance: Patient limited by fatigue Patient left: in chair;with call bell/phone within reach;with chair alarm set Nurse Communication: Mobility status PT Visit Diagnosis: Unsteadiness on feet (R26.81);Muscle weakness (generalized) (M62.81);Pain Pain - part of body:  (ribs)     Time: 3329-5188 PT Time Calculation (min) (ACUTE ONLY): 26 min  Charges:  $Gait Training: 23-37 mins                     Zaven Klemens M,PT Acute Rehab Services [phone] (pager)    Alvira Philips 02/09/2021, 10:27 AM

## 2021-02-10 NOTE — Progress Notes (Signed)
Physical Therapy Treatment Patient Details Name: Brandon Burke MRN: [id number] DOB: May 11, 1931 Today's Date: 02/10/2021   History of Present Illness 87yo M admitted 10/4 [place] the [place] when he had an unwitnessed fall. Pt with concussion, multiple contusions, and left 3-8 rib fxs with hematoma.  PMHx DVT/PE on Eliquis, prostate CA, HTN, and MVP    PT Comments    Pt admitted with above diagnosis. Pt was able to ambulate in hallway. A few LOB with turns and challenges needing incr support and cues.  Hopeful for Rehab soon.  Pt progressing but needs the intensity of Rehab.  Pt currently with functional limitations due to balance and endurance deficits. Pt will benefit from skilled PT to increase their independence and safety with mobility to allow discharge to the venue listed below.      Recommendations for follow up therapy are one component of a multi-disciplinary discharge planning process, led by the attending physician.  Recommendations may be updated based on patient status, additional functional criteria and insurance authorization.  Follow Up Recommendations  Supervision/Assistance - 24 hour;CIR (Pt lives at [facility] and given that they dont have SNF care there now, pt and wife are interested in [place] and then back to [facility].)     Equipment Recommendations  None recommended by PT    Recommendations for Other Services       Precautions / Restrictions Precautions Precautions: Fall Restrictions Weight Bearing Restrictions: No     Mobility  Bed Mobility Overal bed mobility: Needs Assistance Bed Mobility: Rolling;Sidelying to Sit Rolling: Mod assist Sidelying to sit: Mod assist Supine to sit: Mod assist     General bed mobility comments: Use of log roll technique to reduce pain. Rolling to R  Mod A for rolling and then to bring BLEs over EOB and elevate trunk. Pt needs incr cues for transitions and it is difficult for him to follow cues.     Transfers Overall transfer level: Needs assistance Equipment used: Rolling walker (2 wheeled) Transfers: Sit to/from [facility] Sit to Stand: From elevated surface;Min assist;+2 safety/equipment;Mod assist         General transfer comment: Min A for power up from elevated surface. Noting forward bend of trunk (possibly due to pain). Pt takes incr time to stand all the way to RW.  Incr cues during transitions. Mod assist from stand to sit as pt needs cues to get squared to chair and mod assist to reach back for chair as pt not appearing to process even with max cues needing tactile assist.  Ambulation/Gait Ambulation/Gait assistance: Min assist;+2 safety/equipment Gait Distance (Feet): 250 Feet Assistive device: Rolling walker (2 wheeled) Gait Pattern/deviations: Step-through pattern;Decreased stride length;Decreased step length - right;Decreased step length - left;Trunk flexed   Gait velocity interpretation: <1.31 ft/sec, indicative of household ambulator General Gait Details: Pt was able to ambulate with min assist overall.  Occasional cues to stand tall and look up.  Did have 2 LOB with turns needing assist to steady.Challenges to balance with pt turning head to read signs with pt not losing balance but he did have to stop to read them.    Stairs             Wheelchair Mobility    Modified Rankin (Stroke Patients Only)       Balance Overall balance assessment: Needs assistance Sitting-balance support: Bilateral upper extremity supported;Feet supported Sitting balance-Leahy Scale: Fair Sitting balance - Comments: relies on UE support and took  incr time for pt to get his balance upon initial sitting.   Standing balance support: Bilateral upper extremity supported;During functional activity Standing balance-Leahy Scale: Poor Standing balance comment: relies on UE support as well as external support                            Cognition  Arousal/Alertness: Awake/alert Behavior During Therapy: Flat affect Overall Cognitive Status: Impaired/Different from baseline Area of Impairment: Orientation;Following commands;Safety/judgement;Awareness;Problem solving;Memory               Rancho Levels of Cognitive Functioning Rancho Los Amigos Scales of Cognitive Functioning: Confused/appropriate Orientation Level: Disoriented to;Time (unsure of month)   Memory: Decreased short-term memory;Decreased recall of precautions Following Commands: Follows one step commands inconsistently;Follows one step commands with increased time Safety/Judgement: Decreased awareness of safety;Decreased awareness of deficits   Problem Solving: Decreased initiation;Slow processing;Difficulty sequencing;Requires verbal cues;Requires tactile cues General Comments: Responding to questions. As session progressed, pt becoming mroe fatigued and pt talking less.      Exercises General Exercises - Lower Extremity Ankle Circles/Pumps: AROM;Both;10 reps;Supine Quad Sets: AROM;Both;10 reps;Supine Long Arc Quad: AROM;Both;5 reps;Seated Heel Slides: AROM;Both;10 reps;Supine    General Comments General comments (skin integrity, edema, etc.): wife present      Pertinent Vitals/Pain Pain Assessment: No/denies pain    Home Living                      Prior Function            PT Goals (current goals can now be found in the care plan section) Acute Rehab PT Goals Patient Stated Goal: Get better and go home Progress towards PT goals: Progressing toward goals    Frequency    Min 3X/week      PT Plan Current plan remains appropriate    Co-evaluation              AM-PAC PT "6 Clicks" Mobility   Outcome Measure  Help needed turning from your back to your side while in a flat bed without using bedrails?: A Lot Help needed moving from lying on your back to sitting on the side of a flat bed without using bedrails?: A Lot Help  needed moving to and from a bed to a chair (including a wheelchair)?: A Lot Help needed standing up from a chair using your arms (e.g., wheelchair or bedside chair)?: A Lot Help needed to walk in hospital room?: A Little Help needed climbing 3-5 steps with a railing? : A Lot 6 Click Score: 13    End of Session Equipment Utilized During Treatment: Gait belt Activity Tolerance: Patient limited by fatigue Patient left: in chair;with call bell/phone within reach;with chair alarm set;with family/visitor present Nurse Communication: Mobility status PT Visit Diagnosis: Unsteadiness on feet (R26.81);Muscle weakness (generalized) (M62.81);Pain Pain - part of body:  (ribs)     Time: 5631-4970 PT Time Calculation (min) (ACUTE ONLY): 24 min  Charges:  $Gait Training: 8-22 mins $Therapeutic Exercise: 8-22 mins                     Theodore Rahrig M,PT Acute Rehab Services [phone] (pager)    Alvira Philips 02/10/2021, 10:44 AM

## 2021-02-10 NOTE — Progress Notes (Signed)
Inpatient Rehabilitation Admissions Coordinator   I have received insurance approval for Cir admit , but no bed available to admit to today. I met with patient and contacted his wife by phone and they are aware. I will clarify in the next 24 to 48 hrs the chance of bed available to admit to CIR this week.  Danne Baxter, RN, MSN Rehab Admissions Coordinator [phone] 02/10/2021 4:50 PM

## 2021-02-10 NOTE — Progress Notes (Signed)
[facility] Progress Note     Subjective: CC: generalized pain  Therapy at bedside. Foley in place. Oriented to person, place, reason for hospitalization.  Denies pain. Wife at bedside.  Objective: Vital signs in last 24 hours: Temp:  [97.6 F (36.4 C)-98.7 F (37.1 C)] 98.7 F (37.1 C) (10/11 0859) Pulse Rate:  [70-85] 85 (10/11 0859) Resp:  [16-19] 18 (10/11 0859) BP: (106-142)/(55-93) 139/67 (10/11 0859) SpO2:  [97 %-100 %] 97 % (10/11 0859) Last BM Date: 02/08/21  Intake/Output from previous day: 10/10 0701 - 10/11 0700 In: -  Out: 2000 [Urine:2000] Intake/Output this shift: Total I/O In: 240 [P.O.:240] Out: -   PE: Gen:  Alert, NAD, cooperative HEENT: multiple abrasions over left temporal region appear clean, anicteric sclerae, neck soft and trachea midline Card:  Regular rate and rhythm, pedal pulses 2+ BL Pulm:  Normal effort, clear to auscultation bilaterally Abd: Soft, non-tender, non-distended, +BS, no HSM Skin: warm and dry, no rashes Psych: A&Ox3   Lab Results:  Recent Labs    02/09/21 1027  WBC 8.3  HGB 13.9  HCT 40.7  PLT 198   BMET Recent Labs    02/09/21 1027  NA 138  K 4.0  CL 105  CO2 23  GLUCOSE 123*  BUN 20  CREATININE 1.07  CALCIUM 8.8*   PT/INR No results for input(s): LABPROT, INR in the last 72 hours. CMP     Component Value Date/Time   NA 138 02/09/2021 1027   K 4.0 02/09/2021 1027   CL 105 02/09/2021 1027   CO2 23 02/09/2021 1027   GLUCOSE 123 (H) 02/09/2021 1027   BUN 20 02/09/2021 1027   CREATININE 1.07 02/09/2021 1027   CALCIUM 8.8 (L) 02/09/2021 1027   PROT 6.6 02/03/2021 1608   ALBUMIN 3.5 02/03/2021 1608   AST 26 02/03/2021 1608   ALT 28 02/03/2021 1608   ALKPHOS 70 02/03/2021 1608   BILITOT 0.6 02/03/2021 1608   GFRNONAA >60 02/09/2021 1027   Lipase  No results found for: LIPASE     Studies/Results: No results found.  Anti-infectives: Anti-infectives (From admission, onward)     None        Assessment/Plan  GLF Concussion - CT head negative. Ongoing SLP cog therapies L facial contusion and ecchymoses L facial lacs and abrasion- s/p repair EDPA 10/4. Bacitracin BID. Plan to d/c suture ~10/11 L rib FX 3-8 with extrapleural hematoma - f/u CXR atelectasis, small left pleural effusion, no PNX. Pulmonary toilet, mulltimodal pain control  Density in R middle lung lobe - incidental on CT, needs outpatient follow up Anticoagulation with Eliquis for DVT/PE - continue eliquis, hgb stable HTN - home meds HLD   ID - none. Ucx with multiple species reported 10/6 culture, repeat Ucx 10/8 staph epidermis FEN - reg diet VTE - SCDs, eliquis Foley - pulled out late 10/8 by patient; foley ended up being replaced despite order for I&OP caths-- plan repeat TOV today10/11.     Plan - TBI team therapies. CIR pending insurance auth Suture removal and TOV today   LOS: 7 days    Obie Dredge, [facility] Surgery Please see Amion for pager number during day hours 7:00am-4:30pm

## 2021-02-10 NOTE — Progress Notes (Signed)
OT Cancellation Note  Patient Details Name: Brandon Burke MRN: [id number] DOB: 1931/06/27   Cancelled Treatment:    Reason Eval/Treat Not Completed: Other (comment) (RN reports nursing just helped him back to bed after sitting in the recliner for ~6 hours. Upon arrival, pt sleeping soundly. Will return as schedule allows.)  Brandon, OTR/L Acute Rehab Pager: [phone] Office: [phone] 02/10/2021, 3:33 PM

## 2021-02-11 ENCOUNTER — Encounter (HOSPITAL_COMMUNITY): Payer: Self-pay | Admitting: Physical Medicine & Rehabilitation

## 2021-02-11 ENCOUNTER — Inpatient Hospital Stay (HOSPITAL_COMMUNITY)
Admission: RE | Admit: 2021-02-11 | Discharge: 2021-02-28 | DRG: 945 | Disposition: A | Discharge status: Home Health | Payer: PPO | Source: Intra-hospital | Attending: Physical Medicine & Rehabilitation | Admitting: Physical Medicine & Rehabilitation

## 2021-02-11 ENCOUNTER — Other Ambulatory Visit: Payer: Self-pay

## 2021-02-11 ENCOUNTER — Encounter: Payer: Self-pay | Admitting: Neurology

## 2021-02-11 DIAGNOSIS — S069X0S Unspecified intracranial injury without loss of consciousness, sequela: Secondary | ICD-10-CM | POA: Diagnosis not present

## 2021-02-11 DIAGNOSIS — E785 Hyperlipidemia, unspecified: Secondary | ICD-10-CM | POA: Diagnosis present

## 2021-02-11 DIAGNOSIS — R4587 Impulsiveness: Secondary | ICD-10-CM | POA: Diagnosis not present

## 2021-02-11 DIAGNOSIS — T148XXA Other injury of unspecified body region, initial encounter: Secondary | ICD-10-CM | POA: Diagnosis not present

## 2021-02-11 DIAGNOSIS — Z79899 Other long term (current) drug therapy: Secondary | ICD-10-CM

## 2021-02-11 DIAGNOSIS — R918 Other nonspecific abnormal finding of lung field: Secondary | ICD-10-CM | POA: Diagnosis not present

## 2021-02-11 DIAGNOSIS — W19XXXD Unspecified fall, subsequent encounter: Secondary | ICD-10-CM | POA: Diagnosis present

## 2021-02-11 DIAGNOSIS — Z7901 Long term (current) use of anticoagulants: Secondary | ICD-10-CM | POA: Diagnosis not present

## 2021-02-11 DIAGNOSIS — S0181XD Laceration without foreign body of other part of head, subsequent encounter: Secondary | ICD-10-CM

## 2021-02-11 DIAGNOSIS — A499 Bacterial infection, unspecified: Secondary | ICD-10-CM | POA: Diagnosis not present

## 2021-02-11 DIAGNOSIS — B957 Other staphylococcus as the cause of diseases classified elsewhere: Secondary | ICD-10-CM | POA: Diagnosis present

## 2021-02-11 DIAGNOSIS — S069X1D Unspecified intracranial injury with loss of consciousness of 30 minutes or less, subsequent encounter: Secondary | ICD-10-CM | POA: Diagnosis not present

## 2021-02-11 DIAGNOSIS — S069XAA Unspecified intracranial injury with loss of consciousness status unknown, initial encounter: Secondary | ICD-10-CM | POA: Diagnosis present

## 2021-02-11 DIAGNOSIS — K5903 Drug induced constipation: Secondary | ICD-10-CM

## 2021-02-11 DIAGNOSIS — Z86711 Personal history of pulmonary embolism: Secondary | ICD-10-CM | POA: Diagnosis not present

## 2021-02-11 DIAGNOSIS — E039 Hypothyroidism, unspecified: Secondary | ICD-10-CM

## 2021-02-11 DIAGNOSIS — I10 Essential (primary) hypertension: Secondary | ICD-10-CM

## 2021-02-11 DIAGNOSIS — T1490XA Injury, unspecified, initial encounter: Secondary | ICD-10-CM | POA: Diagnosis not present

## 2021-02-11 DIAGNOSIS — N400 Enlarged prostate without lower urinary tract symptoms: Secondary | ICD-10-CM | POA: Diagnosis present

## 2021-02-11 DIAGNOSIS — S2242XA Multiple fractures of ribs, left side, initial encounter for closed fracture: Secondary | ICD-10-CM | POA: Diagnosis not present

## 2021-02-11 DIAGNOSIS — Z8546 Personal history of malignant neoplasm of prostate: Secondary | ICD-10-CM

## 2021-02-11 DIAGNOSIS — N390 Urinary tract infection, site not specified: Secondary | ICD-10-CM | POA: Diagnosis present

## 2021-02-11 DIAGNOSIS — G4700 Insomnia, unspecified: Secondary | ICD-10-CM | POA: Diagnosis present

## 2021-02-11 DIAGNOSIS — R7401 Elevation of levels of liver transaminase levels: Secondary | ICD-10-CM | POA: Diagnosis not present

## 2021-02-11 DIAGNOSIS — D62 Acute posthemorrhagic anemia: Secondary | ICD-10-CM | POA: Diagnosis not present

## 2021-02-11 DIAGNOSIS — S069X0D Unspecified intracranial injury without loss of consciousness, subsequent encounter: Secondary | ICD-10-CM | POA: Diagnosis not present

## 2021-02-11 DIAGNOSIS — F05 Delirium due to known physiological condition: Secondary | ICD-10-CM | POA: Diagnosis present

## 2021-02-11 DIAGNOSIS — Z79890 Hormone replacement therapy (postmenopausal): Secondary | ICD-10-CM

## 2021-02-11 DIAGNOSIS — S2242XD Multiple fractures of ribs, left side, subsequent encounter for fracture with routine healing: Secondary | ICD-10-CM

## 2021-02-11 DIAGNOSIS — Z86718 Personal history of other venous thrombosis and embolism: Secondary | ICD-10-CM | POA: Diagnosis not present

## 2021-02-11 DIAGNOSIS — S069X0A Unspecified intracranial injury without loss of consciousness, initial encounter: Secondary | ICD-10-CM

## 2021-02-11 DIAGNOSIS — S069X1S Unspecified intracranial injury with loss of consciousness of 30 minutes or less, sequela: Secondary | ICD-10-CM | POA: Diagnosis not present

## 2021-02-11 DIAGNOSIS — S060XAD Concussion with loss of consciousness status unknown, subsequent encounter: Principal | ICD-10-CM

## 2021-02-11 DIAGNOSIS — S2242XS Multiple fractures of ribs, left side, sequela: Secondary | ICD-10-CM | POA: Diagnosis not present

## 2021-02-11 MED ORDER — ONDANSETRON HCL 4 MG/2ML IJ SOLN: 4.0000 mg | Freq: Four times a day (QID) | INTRAMUSCULAR | Status: DC | PRN

## 2021-02-11 MED ORDER — DM-GUAIFENESIN ER 30-600 MG PO TB12
1.0000 | ORAL_TABLET | Freq: Two times a day (BID) | ORAL | Status: DC
  Administered 2021-02-11 – 2021-02-20 (×18): 1 via ORAL
  Filled 2021-02-11 (×18): qty 1

## 2021-02-11 MED ORDER — LEVOTHYROXINE SODIUM 25 MCG PO TABS
25.0000 ug | ORAL_TABLET | Freq: Two times a day (BID) | ORAL | Status: DC
  Administered 2021-02-11 – 2021-02-28 (×34): 25 ug via ORAL
  Filled 2021-02-11 (×35): qty 1

## 2021-02-11 MED ORDER — APIXABAN 2.5 MG PO TABS
2.5000 mg | ORAL_TABLET | Freq: Two times a day (BID) | ORAL | Status: DC
  Administered 2021-02-11 – 2021-02-16 (×11): 2.5 mg via ORAL
  Filled 2021-02-11 (×11): qty 1

## 2021-02-11 MED ORDER — BACITRACIN ZINC 500 UNIT/GM EX OINT
1.0000 "application " | TOPICAL_OINTMENT | Freq: Two times a day (BID) | CUTANEOUS | Status: DC
  Administered 2021-02-11 – 2021-02-27 (×30): 1 via TOPICAL
  Filled 2021-02-11 (×6): qty 28.35

## 2021-02-11 MED ORDER — DORZOLAMIDE HCL-TIMOLOL MAL 2-0.5 % OP SOLN
1.0000 [drp] | Freq: Two times a day (BID) | OPHTHALMIC | Status: DC
  Administered 2021-02-11 – 2021-02-28 (×34): 1 [drp] via OPHTHALMIC
  Filled 2021-02-11: qty 10

## 2021-02-11 MED ORDER — ATENOLOL 25 MG PO TABS
50.0000 mg | ORAL_TABLET | Freq: Every day | ORAL | Status: DC
  Administered 2021-02-12 – 2021-02-28 (×17): 50 mg via ORAL
  Filled 2021-02-11 (×17): qty 2

## 2021-02-11 MED ORDER — TAMSULOSIN HCL 0.4 MG PO CAPS
0.4000 mg | ORAL_CAPSULE | Freq: Every day | ORAL | Status: DC
  Administered 2021-02-11 – 2021-02-27 (×17): 0.4 mg via ORAL
  Filled 2021-02-11 (×17): qty 1

## 2021-02-11 MED ORDER — PRAVASTATIN SODIUM 40 MG PO TABS
40.0000 mg | ORAL_TABLET | Freq: Every day | ORAL | Status: DC
  Administered 2021-02-11 – 2021-02-27 (×17): 40 mg via ORAL
  Filled 2021-02-11 (×17): qty 1

## 2021-02-11 MED ORDER — DOCUSATE SODIUM 100 MG PO CAPS
100.0000 mg | ORAL_CAPSULE | Freq: Two times a day (BID) | ORAL | Status: DC
  Administered 2021-02-11 – 2021-02-28 (×34): 100 mg via ORAL
  Filled 2021-02-11 (×35): qty 1

## 2021-02-11 MED ORDER — ACETAMINOPHEN 325 MG PO TABS
650.0000 mg | ORAL_TABLET | Freq: Four times a day (QID) | ORAL | Status: DC | PRN
  Administered 2021-02-11 – 2021-02-21 (×8): 650 mg via ORAL
  Filled 2021-02-11 (×8): qty 2

## 2021-02-11 MED ORDER — LATANOPROST 0.005 % OP SOLN
1.0000 [drp] | Freq: Every day | OPHTHALMIC | Status: DC
  Administered 2021-02-11 – 2021-02-27 (×17): 1 [drp] via OPHTHALMIC
  Filled 2021-02-11: qty 2.5

## 2021-02-11 MED ORDER — BISACODYL 10 MG RE SUPP: 10.0000 mg | Freq: Every day | RECTAL | Status: DC | PRN

## 2021-02-11 MED ORDER — FLUTICASONE PROPIONATE 50 MCG/ACT NA SUSP
1.0000 | Freq: Every day | NASAL | Status: DC
  Administered 2021-02-12 – 2021-02-28 (×17): 1 via NASAL
  Filled 2021-02-11: qty 16

## 2021-02-11 MED ORDER — ONDANSETRON 4 MG PO TBDP: 4.0000 mg | ORAL_TABLET | Freq: Four times a day (QID) | ORAL | Status: DC | PRN

## 2021-02-11 MED ORDER — TRAMADOL HCL 50 MG PO TABS
50.0000 mg | ORAL_TABLET | Freq: Four times a day (QID) | ORAL | Status: DC | PRN
  Administered 2021-02-12 – 2021-02-27 (×20): 50 mg via ORAL
  Filled 2021-02-11 (×20): qty 1

## 2021-02-11 MED ORDER — LIDOCAINE 5 % EX PTCH
1.0000 | MEDICATED_PATCH | CUTANEOUS | Status: DC
  Administered 2021-02-12 – 2021-02-28 (×17): 1 via TRANSDERMAL
  Filled 2021-02-11 (×18): qty 1

## 2021-02-11 MED ORDER — POLYETHYLENE GLYCOL 3350 17 G PO PACK
17.0000 g | PACK | Freq: Every day | ORAL | Status: DC
  Administered 2021-02-12 – 2021-02-28 (×17): 17 g via ORAL
  Filled 2021-02-11 (×18): qty 1

## 2021-02-11 MED ORDER — METHOCARBAMOL 500 MG PO TABS
500.0000 mg | ORAL_TABLET | Freq: Three times a day (TID) | ORAL | Status: DC | PRN
  Administered 2021-02-12 – 2021-02-26 (×5): 500 mg via ORAL
  Filled 2021-02-11 (×5): qty 1

## 2021-02-11 MED ORDER — CALCIUM CARBONATE ANTACID 500 MG PO CHEW: 1.0000 | CHEWABLE_TABLET | ORAL | Status: DC | PRN

## 2021-02-11 NOTE — Progress Notes (Signed)
[facility] Progress Note     Subjective: CC:  No new complaints. Pleasantly confused - states he is looking for [place] and has been lost for 3 days. When I tell him he is in the hospital he states he knows he is here because he fell and hit his head. He states his insurance called him and told him his claim for rehab was approved.   Objective: Vital signs in last 24 hours: Temp:  [97.5 F (36.4 C)-97.9 F (36.6 C)] 97.6 F (36.4 C) (10/12 0808) Pulse Rate:  [68-86] 86 (10/12 0808) Resp:  [17-21] 21 (10/12 0808) BP: (127-144)/(66) 144/66 (10/12 0808) SpO2:  [99 %-100 %] 100 % (10/12 0808) Last BM Date: 02/09/21  Intake/Output from previous day: 10/11 0701 - 10/12 0700 In: 82 [P.O.:460] Out: -  Intake/Output this shift: Total I/O In: 260 [P.O.:260] Out: -   PE: Gen:  Alert, NAD, cooperative HEENT: multiple abrasions over left temporal region appear clean, anicteric sclerae, neck soft and trachea midline; 2 sutures removed from left eyebrow. Card:  Regular rate and rhythm, pedal pulses 2+ BL Pulm:  Normal effort, clear to auscultation bilaterally Abd: Soft, non-tender, non-distended, +BS, no HSM Skin: warm and dry, no rashes Psych: A&Ox3   Lab Results:  Recent Labs    02/09/21 1027  WBC 8.3  HGB 13.9  HCT 40.7  PLT 198   BMET Recent Labs    02/09/21 1027  NA 138  K 4.0  CL 105  CO2 23  GLUCOSE 123*  BUN 20  CREATININE 1.07  CALCIUM 8.8*   PT/INR No results for input(s): LABPROT, INR in the last 72 hours. CMP     Component Value Date/Time   NA 138 02/09/2021 1027   K 4.0 02/09/2021 1027   CL 105 02/09/2021 1027   CO2 23 02/09/2021 1027   GLUCOSE 123 (H) 02/09/2021 1027   BUN 20 02/09/2021 1027   CREATININE 1.07 02/09/2021 1027   CALCIUM 8.8 (L) 02/09/2021 1027   PROT 6.6 02/03/2021 1608   ALBUMIN 3.5 02/03/2021 1608   AST 26 02/03/2021 1608   ALT 28 02/03/2021 1608   ALKPHOS 70 02/03/2021 1608   BILITOT 0.6 02/03/2021  1608   GFRNONAA >60 02/09/2021 1027   Lipase  No results found for: LIPASE     Studies/Results: No results found.  Anti-infectives: Anti-infectives (From admission, onward)    None        Assessment/Plan  GLF Concussion - CT head negative. Ongoing SLP cog therapies L facial contusion and ecchymoses L facial lacs and abrasion- s/p repair EDPA 10/4. Bacitracin BID. Plan to d/c suture ~10/11 L rib FX 3-8 with extrapleural hematoma - f/u CXR atelectasis, small left pleural effusion, no PNX. Pulmonary toilet, mulltimodal pain control  Density in R middle lung lobe - incidental on CT, needs outpatient follow up Anticoagulation with Eliquis for DVT/PE - continue eliquis, hgb stable HTN - home meds HLD   ID - none. Ucx with multiple species reported 10/6 culture, repeat Ucx 10/8 staph epidermis FEN - reg diet VTE - SCDs, eliquis Foley - removed 10/11; voiding independently.     Plan - TBI team therapies. medically stable for discharge to CIR. Awaiting bed availability.   LOS: 8 days    Obie Dredge, [facility] Surgery Please see Amion for pager number during day hours 7:00am-4:30pm

## 2021-02-11 NOTE — Progress Notes (Signed)
Brandon Arn, MD   Physician  Physical Medicine and Rehabilitation  PMR Pre-admission     Signed  Date of Service:  02/07/2021  5:10 PM       Related encounter: ED to Hosp-Admission (Discharged) from 02/03/2021 in [place]       Signed      Show:Clear all '[x]' Written'[x]' Templated'[x]' Copied  Added by: '[x]' Cristina Gong, RN'[x]' Lind Covert, Lauren P, CCC-SLP'[x]' Brandon Arn, MD  '[]' Hover for details                                                                                                                                                                                                                                                                                                                                                                                                                                                       PMR Admission Coordinator Pre-Admission Assessment   Patient: Brandon Burke is an 87 y.o., male MRN: [id number] DOB: February 07, 1932 Height: 6' (182.9 cm) Weight: 79.4 kg   Insurance Information HMO:     PPO: yes     PCP:      IPA:      80/20:      OTHER:  PRIMARY: [employer]      Policy#: [id number]  Subscriber: patient CM Name: Marlowe Kays      Phone#: [phone]     Fax#: [phone] Pre-Cert#: [id number] approved for 7 days Employer:  Benefits:  Phone #: [phone]     Name:  Eff. Date: 05/04/19-still active     Deduct: no deductible      Out of Pocket Max: $3,450 ($484.31 met)      Life Max: NA CIR: $325/day co-pay for days 1-6, $0/day days 7-90      SNF: 100% coverage days 1-20, $184/day co-pay for days 21-100; limited to 100 days/benefit period Outpatient: $30/visit co-pay     Co-Pay:  Home Health: 100% coverage      Co-Pay:  DME: 80%     Co-Pay: 20%  co-insurance Providers: in-network SECONDARY:      Policy#:      Phone#:    Development worker, community:       Phone#:    The Engineer, petroleum" for patients in Inpatient Rehabilitation Facilities with attached "Privacy Act [place] Records" was provided and verbally reviewed with: Family   Emergency Contact Information Contact Information       Name Relation Home Work Mobile    Navar[place] Spouse     [phone]    Victor, [place]     [phone]         Current Medical History  Patient Admitting Diagnosis: Debility d/t multiple rib fx   History of Present Illness:  87 year old right-handed male with history of prostate cancer, 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  Per chart review patient lives with spouse at [facility] independent living facility.  Independent prior to admission.  Presented 02/03/2021 after unwitnessed fall.  Unknown loss of consciousness.  He was amnestic to the event.  Cranial CT scan of the head, maxillofacial and cervical spine showed no acute intracranial process.  No acute fracture or traumatic subluxation of cervical spine.  No acute facial fracture.  There was noted old left orbital wall fractures.  CT of the chest abdomen pelvis showed acute posterior left third through eighth rib fractures.  Adjacent pleural thickening/hematoma.  No pneumothorax.  Left lower patchy airspace disease worrisome for infection.  Pulmonary contusion not excluded.  No acute localizing process in the abdomen or pelvis.  There was an elongated lobulated density in the right middle lobe increased in size since prior comparison plan outpatient follow-up.  Patient did sustain left facial laceration and abrasion with repair sutures in the ED.  Admission chemistries unremarkable except glucose 128, creatinine 1.30, alcohol negative, lactic acid 2.0.  Urine culture greater 100,000 Staphylococcus epidermidis.  Patient was cleared to continue  chronic Eliquis.  Patient did have some urinary retention initially with Foley tube but patient pulled out planning voiding trial.  Speech therapy continues to follow for cognitive deficits related to fall impacting memory and problem-solving.  Maintain on a regular diet.     Patient's medical record from [facility] has been reviewed by the rehabilitation admission coordinator and physician.   Past Medical History  History reviewed. No pertinent past medical history.   Has the patient had major surgery during 100 days prior to admission? No   Family History   family history is not on file.   Current Medications   Current Facility-Administered Medications:    0.9 %  sodium chloride infusion, 250 mL, Intravenous, PRN, Georganna Skeans, MD   acetaminophen (TYLENOL) tablet 650 mg, 650 mg, Oral, Q6H, Meuth, Brooke A, PA-C, 650 mg at 02/11/21 [phone]  apixaban (ELIQUIS) tablet 2.5 mg, 2.5 mg, Oral, BID, Meuth, Brooke A, PA-C, 2.5 mg at 02/11/21 0841   atenolol (TENORMIN) tablet 50 mg, 50 mg, Oral, Daily, Georganna Skeans, MD, 50 mg at 02/11/21 0841   bacitracin ointment, , Topical, BID, Meuth, Brooke A, PA-C, Given at 02/11/21 9563   bisacodyl (DULCOLAX) suppository 10 mg, 10 mg, Rectal, Daily PRN, Armandina Gemma, MD   calcium carbonate (TUMS - dosed in mg elemental calcium) chewable tablet 200 mg of elemental calcium, 1 tablet, Oral, PRN, Meuth, Brooke A, PA-C   Chlorhexidine Gluconate Cloth 2 % PADS 6 each, 6 each, Topical, Daily, Armandina Gemma, MD, 6 each at 02/11/21 0845   dextromethorphan-guaiFENesin (Brent DM) 30-600 MG per 12 hr tablet 1 tablet, 1 tablet, Oral, BID, Meuth, Brooke A, PA-C, 1 tablet at 02/11/21 0842   docusate sodium (COLACE) capsule 100 mg, 100 mg, Oral, BID, Meuth, Brooke A, PA-C, 100 mg at 02/11/21 0841   dorzolamide-timolol (COSOPT) 22.3-6.8 MG/ML ophthalmic solution 1 drop, 1 drop, Both Eyes, BID, Meuth, Brooke A, PA-C, 1 drop at 02/11/21 0843   fluticasone  (FLONASE) 50 MCG/ACT nasal spray 1 spray, 1 spray, Each Nare, Daily, Meuth, Brooke A, PA-C, 1 spray at 02/11/21 0843   hydrALAZINE (APRESOLINE) tablet 10 mg, 10 mg, Oral, Q6H PRN, Georganna Skeans, MD   latanoprost (XALATAN) 0.005 % ophthalmic solution 1 drop, 1 drop, Both Eyes, QHS, Meuth, Brooke A, PA-C, 1 drop at 02/10/21 2128   levothyroxine (SYNTHROID) tablet 25 mcg, 25 mcg, Oral, BID, Georganna Skeans, MD, 25 mcg at 02/11/21 0621   lidocaine (LIDODERM) 5 % 1 patch, 1 patch, Transdermal, Q24H, Meuth, Brooke A, PA-C, 1 patch at 02/11/21 0840   methocarbamol (ROBAXIN) tablet 500 mg, 500 mg, Oral, Q8H PRN, Georganna Skeans, MD, 500 mg at 02/10/21 2348   ondansetron (ZOFRAN-ODT) disintegrating tablet 4 mg, 4 mg, Oral, Q6H PRN **OR** ondansetron (ZOFRAN) injection 4 mg, 4 mg, Intravenous, Q6H PRN, Georganna Skeans, MD   polyethylene glycol (MIRALAX / GLYCOLAX) packet 17 g, 17 g, Oral, Daily, Meuth, Brooke A, PA-C, 17 g at 02/11/21 0842   pravastatin (PRAVACHOL) tablet 40 mg, 40 mg, Oral, QHS, Meuth, Brooke A, PA-C, 40 mg at 02/10/21 2126   sodium chloride flush (NS) 0.9 % injection 3 mL, 3 mL, Intravenous, Q12H, Georganna Skeans, MD, 3 mL at 02/11/21 0843   sodium chloride flush (NS) 0.9 % injection 3 mL, 3 mL, Intravenous, PRN, Georganna Skeans, MD   tamsulosin [facility]) capsule 0.4 mg, 0.4 mg, Oral, QPC supper, Meuth, Brooke A, PA-C, 0.4 mg at 02/10/21 1704   traMADol (ULTRAM) tablet 50 mg, 50 mg, Oral, Q6H PRN, Meuth, Brooke A, PA-C, 50 mg at 02/10/21 2348   Patients Current Diet:  Diet Order                  Diet regular Room service appropriate? Yes; Fluid consistency: Thin  Diet effective now                       Precautions / Restrictions Precautions Precautions: Fall Restrictions Weight Bearing Restrictions: No    Has the patient had 2 or more falls or a fall with injury in the past year? Yes   Prior Activity Level Limited Community (1-2x/wk): driving, gets out of house 2-3  days/week   Prior Functional Level Self Care: Did the patient need help bathing, dressing, using the toilet or eating? Independent   Indoor Mobility: Did the patient need assistance  with walking from room to room (with or without device)? Independent   Stairs: Did the patient need assistance with internal or external stairs (with or without device)? Independent   Functional Cognition: Did the patient need help planning regular tasks such as shopping or remembering to take medications? Needed some help   Patient Information Are you of Hispanic, Latino/a,or Spanish origin?: A. No, not of Hispanic, Latino/a, or Spanish origin What is your race?: A. White Do you need or want an interpreter to communicate with a doctor or health care staff?: 0. No   Patient's Response To:  Health Literacy and Transportation Is the patient able to respond to health literacy and transportation needs?: Yes Health Literacy - How often do you need to have someone help you when you read instructions, pamphlets, or other written material from your doctor or pharmacy?: Never In the past 12 months, has lack of transportation kept you from medical appointments or from getting medications?: No In the past 12 months, has lack of transportation kept you from meetings, work, or from getting things needed for daily living?: No   Development worker, international aid / Equipment Home Equipment: Shower seat, Grab bars - tub/shower, Hand held shower head, Walker - 2 wheels, Crutches   Prior Device Use: Indicate devices/aids used by the patient prior to current illness, exacerbation or injury? None of the above   Current Functional Level Cognition   Arousal/Alertness: Awake/alert Overall Cognitive Status: Impaired/Different from baseline Orientation Level: Oriented to person, Disoriented to place, Disoriented to time, Disoriented to situation Following Commands: Follows one step commands inconsistently, Follows one step commands with  increased time Safety/Judgement: Decreased awareness of safety, Decreased awareness of deficits General Comments: Responding to questions. As session progressed, pt becoming mroe fatigued and pt talking less. Attention: Focused, Sustained Focused Attention: Impaired Sustained Attention: Impaired Memory: Impaired Memory Impairment: Decreased recall of new information, Decreased short term memory Awareness: Impaired Problem Solving: Impaired Executive Function: Organizing, Self Monitoring Organizing: Impaired Self Monitoring: Impaired Safety/Judgment: Impaired [employer] Scales of Cognitive Functioning: Confused/appropriate    Extremity Assessment (includes Sensation/Coordination)   Upper Extremity Assessment: RUE deficits/detail RUE Deficits / Details: limited ROM due to pain at RUE  Lower Extremity Assessment: Generalized weakness     ADLs   Overall ADL's : Needs assistance/impaired Eating/Feeding: Set up, Sitting Eating/Feeding Details (indicate cue type and reason): Family reports he was Grooming: Minimal assistance, Sitting Upper Body Bathing: Moderate assistance, Sitting Lower Body Bathing: Moderate assistance, Sit to/from stand Upper Body Dressing : Moderate assistance, Sitting Lower Body Dressing: Maximal assistance, Sit to/from stand Lower Body Dressing Details (indicate cue type and reason): Family reports that pt was able to perform figure four prior to accident Functional mobility during ADLs: Minimal assistance, Rolling walker General ADL Comments: Pt limited by pain and presenting cognitive deficits     Mobility   Overal bed mobility: Needs Assistance Bed Mobility: Rolling, Sidelying to Sit Rolling: Mod assist Sidelying to sit: Mod assist Supine to sit: Mod assist General bed mobility comments: Use of log roll technique to reduce pain. Rolling to R  Mod A for rolling and then to bring BLEs over EOB and elevate trunk. Pt needs incr cues for transitions and  it is difficult for him to follow cues.     Transfers   Overall transfer level: Needs assistance Equipment used: Rolling walker (2 wheeled) Transfers: Sit to/from Stand, Stand Pivot Transfers Sit to Stand: From elevated surface, Min assist, +2 safety/equipment, Mod assist Stand pivot  transfers: Max assist, +2 safety/equipment General transfer comment: Min A for power up from elevated surface. Noting forward bend of trunk (possibly due to pain). Pt takes incr time to stand all the way to RW.  Incr cues during transitions. Mod assist from stand to sit as pt needs cues to get squared to chair and mod assist to reach back for chair as pt not appearing to process even with max cues needing tactile assist.     Ambulation / Gait / Stairs / Wheelchair Mobility   Ambulation/Gait Ambulation/Gait assistance: Min assist, +2 safety/equipment Gait Distance (Feet): 250 Feet Assistive device: Rolling walker (2 wheeled) Gait Pattern/deviations: Step-through pattern, Decreased stride length, Decreased step length - right, Decreased step length - left, Trunk flexed General Gait Details: Pt was able to ambulate with min assist overall.  Occasional cues to stand tall and look up.  Did have 2 LOB with turns needing assist to steady. Gait velocity interpretation: <1.31 ft/sec, indicative of household ambulator     Posture / Balance Dynamic Sitting Balance Sitting balance - Comments: relies on UE support and took incr time for pt to get his balance upon initial sitting. Balance Overall balance assessment: Needs assistance Sitting-balance support: Bilateral upper extremity supported, Feet supported Sitting balance-Leahy Scale: Fair Sitting balance - Comments: relies on UE support and took incr time for pt to get his balance upon initial sitting. Standing balance support: Bilateral upper extremity supported, During functional activity Standing balance-Leahy Scale: Poor Standing balance comment: relies on UE  support as well as external support     Special needs/care consideration Skin Abrasion: eye, face, hand, knee/right,left; Ecchymosis; Bladder incontinence, Urethral catheter    Previous Home Environment  Living Arrangements: Spouse/significant other  Lives With: Spouse Available Help at Discharge: Family, Available 24 hours/day Type of Home: Independent living facility Home Layout: One level Home Access: Level entry Bathroom Shower/Tub: Multimedia programmer: Standard Bathroom Accessibility: Yes How Accessible: Accessible via wheelchair, Accessible via walker [place]: No   Discharge Living Setting Plans for Discharge Living Setting: Patient's home Type of Home at Discharge: [place] Name at Discharge: [facility] Discharge Home Layout: One level Discharge Home Access: Level entry Discharge Bathroom Shower/Tub: Walk-in shower Discharge Bathroom Toilet: Standard Discharge Bathroom Accessibility: Yes How Accessible: Accessible via wheelchair, Accessible via walker Does the patient have any problems obtaining your medications?: No   Social/Family/Support Systems Anticipated Caregiver: Ferdinando Lodge, wife Anticipated Caregiver's Contact Information: [phone] Caregiver Availability: 24/7 Discharge Plan Discussed with Primary Caregiver: Yes Is Caregiver In Agreement with Plan?: Yes Does Caregiver/Family have Issues with Lodging/Transportation while Pt is in Rehab?: No   Goals Patient/Family Goal for Rehab: supervision to light min guard PT, OT and SLP Expected length of stay: ELOS 10 to 14 days Pt/Family Agrees to Admission and willing to participate: Yes Program Orientation Provided & Reviewed with Pt/Caregiver Including Roles  & Responsibilities: Yes   Decrease burden of Care through IP rehab admission: NA   Possible need for SNF placement upon discharge: Not anticipated   Patient Condition: I have reviewed  medical records from [facility], spoken with CM, and patient and spouse. I met with patient at the bedside for inpatient rehabilitation assessment.  Patient will benefit from ongoing PT, OT, and SLP, can actively participate in 3 hours of therapy a day 5 days of the week, and can make measurable gains during the admission.  Patient will also benefit from the coordinated team approach during  an Inpatient Acute Rehabilitation admission.  The patient will receive intensive therapy as well as Rehabilitation physician, nursing, social worker, and care management interventions.  Due to bladder management, safety, skin/wound care, disease management, medication administration, pain management, and patient education the patient requires 24 hour a day rehabilitation nursing.  The patient is currently min to mod assist overall with mobility and basic ADLs.  Discharge setting and therapy post discharge at  [facility], [facility] with [facility]  is anticipated.  Patient has agreed to participate in the Acute Inpatient Rehabilitation Program and will admit today.   Preadmission Screen Completed By:  Bethel Born, 02/11/2021 11:38 AM ______________________________________________________________________   Discussed status with Dr. Posey Pronto on  02/11/2021 at 1138 and received approval for admission today.   Admission Coordinator:  Bethel Born, [place], time  3846 Date 02/11/2021    Assessment/Plan: Diagnosis: Debility  Does the need for close, 24 hr/day Medical supervision in concert with the patient's rehab needs make it unreasonable for this patient to be served in a less intensive setting? 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 Yes Does the patient require coordinated  care of a physician, rehab nurse, PT, OT to address physical and functional deficits in the context of the above medical diagnosis(es)? Yes Addressing deficits in the following areas: balance, endurance, locomotion, strength, transferring, bathing, dressing, toileting, and psychosocial support Can the patient actively participate in an intensive therapy program of at least 3 hrs of therapy 5 days a week? Yes The potential for patient to make measurable gains while on inpatient rehab is good Anticipated functional outcomes upon discharge from inpatient rehab: modified independent and supervision PT, modified independent and supervision OT, modified independent and supervision SLP Estimated rehab length of stay to reach the above functional goals is: 8-12 days. Anticipated discharge destination: Home 10. Overall Rehab/Functional Prognosis: good     MD Signature: Delice Lesch, MD, ABPMR         Revision History                                    Note Details  Author Brandon Arn, MD File Time 02/11/2021 11:53 AM  Author Type Physician Status Signed  Last Editor Brandon Arn, MD Service Physical Medicine and [place] # [id number] [place] Date 02/11/2021

## 2021-02-11 NOTE — H&P (Signed)
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  History taken from chart review, wife, and patient due to cognition.  Patient lives with spouse at [facility] independent living facility.  Independent prior to admission.  He presented on 02/03/2021 after unwitnessed fall.  Unknown loss of consciousness.  He was amnestic to the event.  Cranial CT scan of the head, maxillofacial and cervical spine unremarkable for acute intracranial process.  No acute fracture or traumatic subluxation of cervical spine.  No acute facial fracture.  There was noted old left orbital wall fractures.  CT of the chest abdomen pelvis showed acute posterior left third through eighth rib fractures.  Adjacent pleural thickening/hematoma.  No pneumothorax.  Left lower patchy airspace disease worrisome for infection.  Pulmonary contusion not excluded.  No acute localizing process in the abdomen or pelvis.  There was an elongated lobulated density in the right middle lobe increased in size since prior comparison plan outpatient follow-up.  Patient did sustain left facial laceration and abrasion with repair sutures in the ED.  Admission chemistries unremarkable except glucose 128, creatinine 1.30, alcohol negative, lactic acid 2.0.  Hospital course further complicated by acute lower UTI.  Urine culture greater 100,000 Staphylococcus epidermidis.  Patient was cleared to continue chronic Eliquis.  Patient did have some urinary retention initially with Foley tube but patient pulled out planning voiding trial.  Speech therapy continues to follow for cognitive deficits related to fall impacting memory and problem-solving.  Maintain on a regular diet.  Therapy evaluations completed due to patient decreased functional mobility was admitted  for a comprehensive rehab program.  Please see preadmission assessment from earlier today as well.  Review of Systems  Constitutional:  Negative for chills and fever.  HENT:  Negative for hearing loss.   Eyes:  Negative for blurred vision and double vision.  Respiratory:  Negative for cough and shortness of breath.   Cardiovascular:  Negative for chest pain, palpitations and leg swelling.  Gastrointestinal:  Positive for constipation. Negative for heartburn, nausea and vomiting.  Genitourinary:  Positive for urgency. Negative for dysuria, flank pain and hematuria.  Musculoskeletal:  Negative for joint pain and myalgias.  Skin:  Negative for rash.  Neurological:  Negative for weakness.  All other systems reviewed and are negative. Past medical history: Prostate cancer, DVT/PE, hypertension, MVP Past surgical history: Unremarkable with trauma Family history: Nonpertinent with trauma  Social History:  has no history on file for tobacco use, alcohol use, and drug use. Allergies:  Allergies  Allergen Reactions   Misc. Sulfonamide Containing Compounds    Nitrofuran Derivatives    Medications Prior to Admission  Medication Sig Dispense Refill   acetaminophen (TYLENOL) 500 MG tablet Take 500 mg by mouth every 6 (six) hours as needed for moderate pain or headache.     ARTIFICIAL TEAR OP Place 1 drop into both eyes as needed (dry eyes).     atenolol (TENORMIN) 50 MG tablet Take 50 mg by mouth daily.     Azelastine HCl 0.15 % SOLN Place 2 sprays into both nostrils 2 (two) times daily.     calcium carbonate (TUMS - DOSED IN MG ELEMENTAL CALCIUM) 500 MG chewable tablet Chew 1 tablet by mouth as needed for indigestion or heartburn.     cholecalciferol (VITAMIN D)  25 MCG (1000 UNIT) tablet Take 1,000 Units by mouth daily.     dextromethorphan-guaiFENesin (MUCINEX DM) 30-600 MG 12hr tablet Take 1 tablet by mouth 2 (two) times daily.     docusate sodium (COLACE) 100 MG capsule Take 100 mg by mouth  daily.     dorzolamide-timolol (COSOPT) 22.3-6.8 MG/ML ophthalmic solution Place 1 drop into both eyes 2 (two) times daily.     ELIQUIS 2.5 MG TABS tablet Take 2.5 mg by mouth 2 (two) times daily.     fluocinonide (LIDEX) 0.05 % external solution Apply 1 application topically at bedtime. Apply to scalp     fluticasone (FLONASE) 50 MCG/ACT nasal spray Place 1 spray into both nostrils daily.     folic acid (FOLVITE) 431 MCG tablet Take 400 mcg by mouth daily.     levothyroxine (SYNTHROID) 25 MCG tablet Take 25 mcg by mouth daily.     loratadine (CLARITIN) 10 MG tablet Take 10 mg by mouth daily.     Moringa Oleifera (MORINGA PO) Take 1 tablet by mouth in the morning and at bedtime.     Multiple Vitamin (MULTIVITAMIN) tablet Take 1 tablet by mouth daily.     polyethylene glycol powder (GLYCOLAX/MIRALAX) 17 GM/SCOOP powder Take 17 g by mouth daily.     pravastatin (PRAVACHOL) 40 MG tablet Take 40 mg by mouth at bedtime.     Probiotic Product (ALIGN PO) Take 1 capsule by mouth daily.     Travoprost, BAK Free, (TRAVATAN) 0.004 % SOLN ophthalmic solution Place 1 drop into both eyes at bedtime.      Drug Regimen Review Drug regimen was reviewed and remains appropriate with no significant issues identified  Home: Home Living Family/patient expects to be discharged to:: Private residence Living Arrangements: Spouse/significant other Available Help at Discharge: Family, Available 24 hours/day Type of Home: Independent living facility Home Access: Level entry Home Layout: One level Bathroom Shower/Tub: Multimedia programmer: Standard Bathroom Accessibility: Yes Home Equipment: Civil engineer, contracting, Grab bars - tub/shower, Hand held shower head, Environmental consultant - 2 wheels, Crutches  Lives With: Spouse   Functional History: Prior Function Level of Independence: Independent Comments: Pt and wife reside at [facility]:  Mobility: Bed Mobility Overal bed mobility: Needs  Assistance Bed Mobility: Rolling, Sidelying to Sit Rolling: Mod assist Sidelying to sit: Mod assist Supine to sit: Mod assist General bed mobility comments: Use of log roll technique to reduce pain. Rolling to R  Mod A for rolling and then to bring BLEs over EOB and elevate trunk. Pt needs incr cues for transitions and it is difficult for him to follow cues. Transfers Overall transfer level: Needs assistance Equipment used: Rolling walker (2 wheeled) Transfers: Sit to/from Stand, Stand Pivot Transfers Sit to Stand: From elevated surface, Min assist, +2 safety/equipment, Mod assist Stand pivot transfers: Max assist, +2 safety/equipment General transfer comment: Min A for power up from elevated surface. Noting forward bend of trunk (possibly due to pain). Pt takes incr time to stand all the way to RW.  Incr cues during transitions. Mod assist from stand to sit as pt needs cues to get squared to chair and mod assist to reach back for chair as pt not appearing to process even with max cues needing tactile assist. Ambulation/Gait Ambulation/Gait assistance: Min assist, +2 safety/equipment Gait Distance (Feet): 250 Feet Assistive device: Rolling walker (2 wheeled) Gait Pattern/deviations: Step-through pattern, Decreased stride length, Decreased step length - right, Decreased step length - left, Trunk  flexed General Gait Details: Pt was able to ambulate with min assist overall.  Occasional cues to stand tall and look up.  Did have 2 LOB with turns needing assist to steady. Gait velocity interpretation: <1.31 ft/sec, indicative of household ambulator    ADL: ADL Overall ADL's : Needs assistance/impaired Eating/Feeding: Set up, Sitting Eating/Feeding Details (indicate cue type and reason): Family reports he was Grooming: Minimal assistance, Sitting Upper Body Bathing: Moderate assistance, Sitting Lower Body Bathing: Moderate assistance, Sit to/from stand Upper Body Dressing : Moderate  assistance, Sitting Lower Body Dressing: Maximal assistance, Sit to/from stand Lower Body Dressing Details (indicate cue type and reason): Family reports that pt was able to perform figure four prior to accident Functional mobility during ADLs: Minimal assistance, Rolling walker General ADL Comments: Pt limited by pain and presenting cognitive deficits  Cognition: Cognition Overall Cognitive Status: Impaired/Different from baseline Arousal/Alertness: Awake/alert Orientation Level: Oriented to person, Oriented to place, Oriented to situation, Disoriented to time Year: 2019 Month: January Day of Week: Incorrect Attention: Focused, Sustained Focused Attention: Impaired Sustained Attention: Impaired Memory: Impaired Memory Impairment: Decreased recall of new information, Decreased short term memory Awareness: Impaired Problem Solving: Impaired Executive Function: Organizing, Self Monitoring Organizing: Impaired Self Monitoring: Impaired Safety/Judgment: Impaired [employer] Scales of Cognitive Functioning: Confused/appropriate Cognition Arousal/Alertness: Awake/alert Behavior During Therapy: Flat affect Overall Cognitive Status: Impaired/Different from baseline Area of Impairment: Orientation, Following commands, Safety/judgement, Awareness, Problem solving, Memory Orientation Level: Disoriented to, Time (unsure of month) Memory: Decreased short-term memory, Decreased recall of precautions Following Commands: Follows one step commands inconsistently, Follows one step commands with increased time Safety/Judgement: Decreased awareness of safety, Decreased awareness of deficits Problem Solving: Decreased initiation, Slow processing, Difficulty sequencing, Requires verbal cues, Requires tactile cues General Comments: Responding to questions. As session progressed, pt becoming mroe fatigued and pt talking less.  Physical Exam: Blood pressure 140/66, pulse 68, temperature (!) 97.5  F (36.4 C), temperature source Oral, resp. rate 17, height 6' (1.829 m), weight 79.4 kg, SpO2 100 %. Physical Exam Vitals reviewed.  Constitutional:      General: He is not in acute distress.    Appearance: Normal appearance.     Comments: Frail  HENT:     Head: Normocephalic.     Comments: Healing abrasions with ecchymosis to face and scalp.    Nose: Nose normal.  Eyes:     General:        Right eye: No discharge.        Left eye: No discharge.     Comments: Limited range of motion in left eye  Cardiovascular:     Rate and Rhythm: Normal rate and regular rhythm.  Pulmonary:     Effort: Pulmonary effort is normal. No respiratory distress.     Breath sounds: Normal breath sounds. No stridor.  Abdominal:     General: Abdomen is flat. There is no distension.  Musculoskeletal:     Cervical back: Normal range of motion and neck supple.     Comments: No edema or tenderness in extremities  Skin:    General: Skin is warm.     Comments: Scattered lesions  Neurological:     Mental Status: He is alert.     Comments: Alert and oriented x2  Somewhat impulsive.   Makes eye contact with examiner.   Follows simple commands.   He does have decreased insight and awareness. Motor: 4+/5 throughout Left eye ptosis  Psychiatric:        Speech:  Speech is delayed.        Behavior: Behavior is slowed.    Results for orders placed or performed during the hospital encounter of 02/03/21 (from the past 48 hour(s))  CBC     Status: Abnormal   Collection Time: 02/09/21 10:27 AM  Result Value Ref Range   WBC 8.3 4.0 - 10.5 K/uL   RBC 4.20 (L) 4.22 - 5.81 MIL/uL   Hemoglobin 13.9 13.0 - 17.0 g/dL   HCT 40.7 39.0 - 52.0 %   MCV 96.9 80.0 - 100.0 fL   MCH 33.1 26.0 - 34.0 pg   MCHC 34.2 30.0 - 36.0 g/dL   RDW 12.7 11.5 - 15.5 %   Platelets 198 150 - 400 K/uL   nRBC 0.0 0.0 - 0.2 %    Comment: Performed at [facility], [place] [address] [place], [place] [address]  Basic metabolic panel      Status: Abnormal   Collection Time: 02/09/21 10:27 AM  Result Value Ref Range   Sodium 138 135 - 145 mmol/L   Potassium 4.0 3.5 - 5.1 mmol/L   Chloride 105 98 - 111 mmol/L   CO2 23 22 - 32 mmol/L   Glucose, Bld 123 (H) 70 - 99 mg/dL    Comment: Glucose reference range applies only to samples taken after fasting for at least 8 hours.   BUN 20 8 - 23 mg/dL   Creatinine, Ser 1.07 0.61 - 1.24 mg/dL   Calcium 8.8 (L) 8.9 - 10.3 mg/dL   GFR, Estimated >60 >60 mL/min    Comment: (NOTE) Calculated using the CKD-EPI Creatinine Equation (2021)    Anion gap 10 5 - 15    Comment: Performed at [facility] [address] [place], [place] [address]   No results found.     Medical Problem List and Plan: 1.  TBI/concussion secondary to fall 02/03/2021.  Cranial CT scan negative.  -patient may shower  -ELOS/Goals: 6-10 days/supervision  Admit to CIR 2.  Antithrombotics: -DVT/anticoagulation:  Pharmaceutical: Other (comment)/Eliquis  -antiplatelet therapy: N/A 3. Pain Management: Lidoderm patch, Robaxin as needed, Ultram as needed  Monitor with increased exertion 4. Mood: Provide emotional support  -antipsychotic agents: N/A 5. Neuropsych: This patient is not capable of making decisions on his own behalf. 6. Skin/Wound Care: Left facial laceration or abrasion.  Sutures removed.  Routine skin checks 7. Fluids/Electrolytes/Nutrition: Routine in and outs  CMP ordered for tomorrow a.m. 8.  Multiple left rib fractures with extrapleural hematoma.  Conservative management 9.  Density right middle lung lobe.  Incidental on CT.  Follow-up outpatient 10.  History of DVT/pulmonary emboli.  Eliquis 11.  Essential hypertension: Tenormin 50 mg daily 12.  BPH.  Flomax 0.4 mg daily.  Check PVR 13.  Hyperlipidemia.  Pravachol 14.  Hypothyroidism.  Synthroid 15.  Drug-induced constipation.  MiraLAX daily, Colace 100 mg twice daily  Adjust bowel meds as necessary  Cathlyn Parsons,  PA-C 02/11/2021   I have personally performed a face to face diagnostic evaluation, including, but not limited to relevant history and physical exam findings, of this patient and developed relevant assessment and plan.  Additionally, I have reviewed and concur with the physician assistant's documentation above.  Delice Lesch, MD, ABPMR

## 2021-02-11 NOTE — Progress Notes (Addendum)
Inpatient Rehabilitation Medication Review by a Pharmacist  A complete drug regimen review was completed for this patient to identify any potential clinically significant medication issues.  High Risk Drug Classes Is patient taking? Indication by Medication  Antipsychotic No   Anticoagulant Yes Apixaban for history of DVT/PE  Antibiotic No   Opioid No   Antiplatelet No   Hypoglycemics/insulin No   Vasoactive Medication Yes Atenolol for hypertension, pravastatin for hyperlipidemia  Chemotherapy No   Other Yes Lidocaine patch for pain, Robaxin PRN for muscle spasms, Ultram PRN for pain, Flomax for BPH, Synthroid for hypothyroidism, Miralax and colace for constipation, Bacitracin for facial lacerations/abrasion, Mucinex for congestion, Cosopt and latanaprost for history of elevated intraocular pressure, Flonase for allergic rhinitis     Type of Medication Issue Identified Description of Issue Recommendation(s)  Drug Interaction(s) (clinically significant)     Duplicate Therapy     Allergy     No Medication Administration End Date     Incorrect Dose     Additional Drug Therapy Needed     Significant med changes from prior encounter (inform family/care partners about these prior to discharge).    Other       Clinically significant medication issues were identified that warrant physician communication and completion of prescribed/recommended actions by midnight of the next day:  No  Name of provider notified for urgent issues identified:   Provider Method of Notification:     Pharmacist comments:   Time spent performing this drug regimen review (minutes):  [place], PharmD, BCPS Please check AMION for all [place] contact numbers Clinical Pharmacist 02/11/2021 6:26 PM

## 2021-02-11 NOTE — TOC Transition Note (Signed)
Transition of Care [facility]) - CM/SW Discharge Note   Patient Details  Name: Brandon Burke MRN: [id number] Date of Birth: August 27, 1931  [facility] CM/SW Contact:  Ella Bodo, RN Phone Number: 02/11/2021, 11:40 AM   Clinical Narrative:    Pt medically stable for discharge today, and has been accepted for admission to [facility].  Plan dc to CIR today pending bed available.    Final next level of care: IP Rehab Facility Barriers to Discharge: Barriers Resolved   Patient Goals and CMS Choice Patient states their goals for this hospitalization and ongoing recovery are:: to go home CMS Medicare.gov Compare Post Acute Care list provided to:: Patient Choice offered to / list presented to : Patient                        Discharge Plan and Services   Discharge Planning Services: CM Consult Post Acute Care Choice: IP Rehab                               Social Determinants of Health (SDOH) Interventions     Readmission Risk Interventions No flowsheet data found.  Reinaldo Raddle, RN, BSN  Trauma/Neuro ICU Case Manager [phone]

## 2021-02-11 NOTE — Progress Notes (Signed)
Inpatient Rehabilitation Admissions Coordinator   CIR bed is available for his admit today. I met with patient, his wife and BIL at bedside . They are in agreement . Acute team and TOC made aware. I will make the arrangements to admit today.  Danne Baxter, RN, MSN Rehab Admissions Coordinator [phone] 02/11/2021 11:33 AM

## 2021-02-11 NOTE — H&P (Signed)
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  History taken from chart review, wife, and patient due to cognition.  Patient lives with spouse at [facility] independent living facility.  Independent prior to admission.  He presented on 02/03/2021 after unwitnessed fall.  Unknown loss of consciousness.  He was amnestic to the event.  Cranial CT scan of the head, maxillofacial and cervical spine unremarkable for acute intracranial process.  No acute fracture or traumatic subluxation of cervical spine.  No acute facial fracture.  There was noted old left orbital wall fractures.  CT of the chest abdomen pelvis showed acute posterior left third through eighth rib fractures.  Adjacent pleural thickening/hematoma.  No pneumothorax.  Left lower patchy airspace disease worrisome for infection.  Pulmonary contusion not excluded.  No acute localizing process in the abdomen or pelvis.  There was an elongated lobulated density in the right middle lobe increased in size since prior comparison plan outpatient follow-up.  Patient did sustain left facial laceration and abrasion with repair sutures in the ED.  Admission chemistries unremarkable except glucose 128, creatinine 1.30, alcohol negative, lactic acid 2.0.  Hospital course further complicated by acute lower UTI.  Urine culture greater 100,000 Staphylococcus epidermidis.  Patient was cleared to continue chronic Eliquis.  Patient did have some urinary retention initially with Foley tube but patient pulled out planning voiding trial.  Speech therapy continues to follow for cognitive deficits related to fall impacting memory and problem-solving.  Maintain on a regular diet.  Therapy evaluations completed due to patient decreased functional mobility was admitted  for a comprehensive rehab program.  Please see preadmission assessment from earlier today as well.  Review of Systems  Constitutional:  Negative for chills and fever.  HENT:  Negative for hearing loss.   Eyes:  Negative for blurred vision and double vision.  Respiratory:  Negative for cough and shortness of breath.   Cardiovascular:  Negative for chest pain, palpitations and leg swelling.  Gastrointestinal:  Positive for constipation. Negative for heartburn, nausea and vomiting.  Genitourinary:  Positive for urgency. Negative for dysuria, flank pain and hematuria.  Musculoskeletal:  Negative for joint pain and myalgias.  Skin:  Negative for rash.  Neurological:  Negative for weakness.  All other systems reviewed and are negative. Past medical history: Prostate cancer, DVT/PE, hypertension, MVP Past surgical history: Unremarkable with trauma Family history: Nonpertinent with trauma  Social History:  has no history on file for tobacco use, alcohol use, and drug use. Allergies:  Allergies  Allergen Reactions   Misc. Sulfonamide Containing Compounds    Nitrofuran Derivatives    Medications Prior to Admission  Medication Sig Dispense Refill   acetaminophen (TYLENOL) 500 MG tablet Take 500 mg by mouth every 6 (six) hours as needed for moderate pain or headache.     ARTIFICIAL TEAR OP Place 1 drop into both eyes as needed (dry eyes).     atenolol (TENORMIN) 50 MG tablet Take 50 mg by mouth daily.     Azelastine HCl 0.15 % SOLN Place 2 sprays into both nostrils 2 (two) times daily.     calcium carbonate (TUMS - DOSED IN MG ELEMENTAL CALCIUM) 500 MG chewable tablet Chew 1 tablet by mouth as needed for indigestion or heartburn.     cholecalciferol (VITAMIN D)  25 MCG (1000 UNIT) tablet Take 1,000 Units by mouth daily.     dextromethorphan-guaiFENesin (MUCINEX DM) 30-600 MG 12hr tablet Take 1 tablet by mouth 2 (two) times daily.     docusate sodium (COLACE) 100 MG capsule Take 100 mg by mouth  daily.     dorzolamide-timolol (COSOPT) 22.3-6.8 MG/ML ophthalmic solution Place 1 drop into both eyes 2 (two) times daily.     ELIQUIS 2.5 MG TABS tablet Take 2.5 mg by mouth 2 (two) times daily.     fluocinonide (LIDEX) 0.05 % external solution Apply 1 application topically at bedtime. Apply to scalp     fluticasone (FLONASE) 50 MCG/ACT nasal spray Place 1 spray into both nostrils daily.     folic acid (FOLVITE) 834 MCG tablet Take 400 mcg by mouth daily.     levothyroxine (SYNTHROID) 25 MCG tablet Take 25 mcg by mouth daily.     loratadine (CLARITIN) 10 MG tablet Take 10 mg by mouth daily.     Moringa Oleifera (MORINGA PO) Take 1 tablet by mouth in the morning and at bedtime.     Multiple Vitamin (MULTIVITAMIN) tablet Take 1 tablet by mouth daily.     polyethylene glycol powder (GLYCOLAX/MIRALAX) 17 GM/SCOOP powder Take 17 g by mouth daily.     pravastatin (PRAVACHOL) 40 MG tablet Take 40 mg by mouth at bedtime.     Probiotic Product (ALIGN PO) Take 1 capsule by mouth daily.     Travoprost, BAK Free, (TRAVATAN) 0.004 % SOLN ophthalmic solution Place 1 drop into both eyes at bedtime.      Drug Regimen Review Drug regimen was reviewed and remains appropriate with no significant issues identified  Home: Home Living Family/patient expects to be discharged to:: Private residence Living Arrangements: Spouse/significant other Available Help at Discharge: Family, Available 24 hours/day Type of Home: Independent living facility Home Access: Level entry Home Layout: One level Bathroom Shower/Tub: Multimedia programmer: Standard Bathroom Accessibility: Yes Home Equipment: Civil engineer, contracting, Grab bars - tub/shower, Hand held shower head, Environmental consultant - 2 wheels, Crutches  Lives With: Spouse   Functional History: Prior Function Level of Independence: Independent Comments: Pt and wife reside at [facility]:  Mobility: Bed Mobility Overal bed mobility: Needs  Assistance Bed Mobility: Rolling, Sidelying to Sit Rolling: Mod assist Sidelying to sit: Mod assist Supine to sit: Mod assist General bed mobility comments: Use of log roll technique to reduce pain. Rolling to R  Mod A for rolling and then to bring BLEs over EOB and elevate trunk. Pt needs incr cues for transitions and it is difficult for him to follow cues. Transfers Overall transfer level: Needs assistance Equipment used: Rolling walker (2 wheeled) Transfers: Sit to/from Stand, Stand Pivot Transfers Sit to Stand: From elevated surface, Min assist, +2 safety/equipment, Mod assist Stand pivot transfers: Max assist, +2 safety/equipment General transfer comment: Min A for power up from elevated surface. Noting forward bend of trunk (possibly due to pain). Pt takes incr time to stand all the way to RW.  Incr cues during transitions. Mod assist from stand to sit as pt needs cues to get squared to chair and mod assist to reach back for chair as pt not appearing to process even with max cues needing tactile assist. Ambulation/Gait Ambulation/Gait assistance: Min assist, +2 safety/equipment Gait Distance (Feet): 250 Feet Assistive device: Rolling walker (2 wheeled) Gait Pattern/deviations: Step-through pattern, Decreased stride length, Decreased step length - right, Decreased step length - left, Trunk  flexed General Gait Details: Pt was able to ambulate with min assist overall.  Occasional cues to stand tall and look up.  Did have 2 LOB with turns needing assist to steady. Gait velocity interpretation: <1.31 ft/sec, indicative of household ambulator    ADL: ADL Overall ADL's : Needs assistance/impaired Eating/Feeding: Set up, Sitting Eating/Feeding Details (indicate cue type and reason): Family reports he was Grooming: Minimal assistance, Sitting Upper Body Bathing: Moderate assistance, Sitting Lower Body Bathing: Moderate assistance, Sit to/from stand Upper Body Dressing : Moderate  assistance, Sitting Lower Body Dressing: Maximal assistance, Sit to/from stand Lower Body Dressing Details (indicate cue type and reason): Family reports that pt was able to perform figure four prior to accident Functional mobility during ADLs: Minimal assistance, Rolling walker General ADL Comments: Pt limited by pain and presenting cognitive deficits  Cognition: Cognition Overall Cognitive Status: Impaired/Different from baseline Arousal/Alertness: Awake/alert Orientation Level: Oriented to person, Oriented to place, Oriented to situation, Disoriented to time Year: 2019 Month: January Day of Week: Incorrect Attention: Focused, Sustained Focused Attention: Impaired Sustained Attention: Impaired Memory: Impaired Memory Impairment: Decreased recall of new information, Decreased short term memory Awareness: Impaired Problem Solving: Impaired Executive Function: Organizing, Self Monitoring Organizing: Impaired Self Monitoring: Impaired Safety/Judgment: Impaired [employer] Scales of Cognitive Functioning: Confused/appropriate Cognition Arousal/Alertness: Awake/alert Behavior During Therapy: Flat affect Overall Cognitive Status: Impaired/Different from baseline Area of Impairment: Orientation, Following commands, Safety/judgement, Awareness, Problem solving, Memory Orientation Level: Disoriented to, Time (unsure of month) Memory: Decreased short-term memory, Decreased recall of precautions Following Commands: Follows one step commands inconsistently, Follows one step commands with increased time Safety/Judgement: Decreased awareness of safety, Decreased awareness of deficits Problem Solving: Decreased initiation, Slow processing, Difficulty sequencing, Requires verbal cues, Requires tactile cues General Comments: Responding to questions. As session progressed, pt becoming mroe fatigued and pt talking less.  Physical Exam: Blood pressure 140/66, pulse 68, temperature (!) 97.5  F (36.4 C), temperature source Oral, resp. rate 17, height 6' (1.829 m), weight 79.4 kg, SpO2 100 %. Physical Exam Vitals reviewed.  Constitutional:      General: He is not in acute distress.    Appearance: Normal appearance.     Comments: Frail  HENT:     Head: Normocephalic.     Comments: Healing abrasions with ecchymosis to face and scalp.    Nose: Nose normal.  Eyes:     General:        Right eye: No discharge.        Left eye: No discharge.     Comments: Limited range of motion in left eye  Cardiovascular:     Rate and Rhythm: Normal rate and regular rhythm.  Pulmonary:     Effort: Pulmonary effort is normal. No respiratory distress.     Breath sounds: Normal breath sounds. No stridor.  Abdominal:     General: Abdomen is flat. There is no distension.  Musculoskeletal:     Cervical back: Normal range of motion and neck supple.     Comments: No edema or tenderness in extremities  Skin:    General: Skin is warm.     Comments: Scattered lesions  Neurological:     Mental Status: He is alert.     Comments: Alert and oriented x2  Somewhat impulsive.   Makes eye contact with examiner.   Follows simple commands.   He does have decreased insight and awareness. Motor: 4+/5 throughout Left eye ptosis  Psychiatric:        Speech:  Speech is delayed.        Behavior: Behavior is slowed.    Results for orders placed or performed during the hospital encounter of 02/03/21 (from the past 48 hour(s))  CBC     Status: Abnormal   Collection Time: 02/09/21 10:27 AM  Result Value Ref Range   WBC 8.3 4.0 - 10.5 K/uL   RBC 4.20 (L) 4.22 - 5.81 MIL/uL   Hemoglobin 13.9 13.0 - 17.0 g/dL   HCT 40.7 39.0 - 52.0 %   MCV 96.9 80.0 - 100.0 fL   MCH 33.1 26.0 - 34.0 pg   MCHC 34.2 30.0 - 36.0 g/dL   RDW 12.7 11.5 - 15.5 %   Platelets 198 150 - 400 K/uL   nRBC 0.0 0.0 - 0.2 %    Comment: Performed at [facility], [place] [address], [place], [place] [address]  Basic metabolic panel      Status: Abnormal   Collection Time: 02/09/21 10:27 AM  Result Value Ref Range   Sodium 138 135 - 145 mmol/L   Potassium 4.0 3.5 - 5.1 mmol/L   Chloride 105 98 - 111 mmol/L   CO2 23 22 - 32 mmol/L   Glucose, Bld 123 (H) 70 - 99 mg/dL    Comment: Glucose reference range applies only to samples taken after fasting for at least 8 hours.   BUN 20 8 - 23 mg/dL   Creatinine, Ser 1.07 0.61 - 1.24 mg/dL   Calcium 8.8 (L) 8.9 - 10.3 mg/dL   GFR, Estimated >60 >60 mL/min    Comment: (NOTE) Calculated using the CKD-EPI Creatinine Equation (2021)    Anion gap 10 5 - 15    Comment: Performed at [facility] [address] [place], [place] [address]   No results found.   Medical Problem List and Plan: 1.  TBI/concussion secondary to fall 02/03/2021.  Cranial CT scan negative.  -patient may shower  -ELOS/Goals: 6-10 days/supervision  Admit to CIR 2.  Antithrombotics: -DVT/anticoagulation:  Pharmaceutical: Other (comment)/Eliquis  -antiplatelet therapy: N/A 3. Pain Management: Lidoderm patch, Robaxin as needed, Ultram as needed  Monitor with increased exertion 4. Mood: Provide emotional support  -antipsychotic agents: N/A 5. Neuropsych: This patient is not capable of making decisions on his own behalf. 6. Skin/Wound Care: Left facial laceration or abrasion.  Sutures removed.  Routine skin checks 7. Fluids/Electrolytes/Nutrition: Routine in and outs  CMP ordered for tomorrow a.m. 8.  Multiple left rib fractures with extrapleural hematoma.  Conservative management 9.  Density right middle lung lobe.  Incidental on CT.  Follow-up outpatient 10.  History of DVT/pulmonary emboli.  Eliquis 11.  Essential hypertension: Tenormin 50 mg daily 12.  BPH.  Flomax 0.4 mg daily.  Check PVR 13.  Hyperlipidemia.  Pravachol 14.  Hypothyroidism.  Synthroid 15.  Drug-induced constipation.  MiraLAX daily, Colace 100 mg twice daily  Adjust bowel meds as necessary  Cathlyn Parsons,  PA-C 02/11/2021   I have personally performed a face to face diagnostic evaluation, including, but not limited to relevant history and physical exam findings, of this patient and developed relevant assessment and plan.  Additionally, I have reviewed and concur with the physician assistant's documentation above.  Delice Lesch, MD, ABPMR  The patient's status has not changed. Any changes from the pre-admission screening or documentation from the acute chart are noted above.   Delice Lesch, MD, ABPMR

## 2021-02-11 NOTE — Progress Notes (Signed)
Patient arrived at 1630 from 6N 25. Patient is A&Ox 1. Patient is  blind in his left eye and R eye has glaucoma with limited vision. Patient is also HOH. Patient is very confused, anxious and agitated. Patient continues to get OOB unassisted without using the call light. Wife at bed side who uses the call light for her husband. Wife also states husband becomes more restless and anxious in the evening. Patient had a fall at home, wife states he has had several falls. They live in an assisted living center. Patient has multiple abrasions on left side of face, and right. Bilateral knuckles and bilateral knees scabbed. Complains of right rib pain, has Lidoderm patch on due to multiple right rib FX. Skin warm, positive pedal and radial pulses, cap  refill less than 3 seconds. Bed in low position, call light within reach. Patient has a Oncologist for safety.

## 2021-02-11 NOTE — Discharge Summary (Signed)
[name] Discharge Summary   Patient ID: Brandon Burke MRN: [id number] DOB/AGE: July 01, 1931 87 y.o.  Admit date: 02/03/2021 Discharge date: 02/11/2021  Admitting Diagnosis: Fall   Discharge Diagnosis Patient Active Problem List   Diagnosis Date Noted   Multiple fractures of ribs, left side, initial encounter for closed fracture 02/03/2021   Consultants none   Procedures None  HPI: 87 y/o M with PMHx DVT/PE on Eliquis, prostate CA, HTN, and MVP was going to the bank when he had an unwitnessed fall. He was evaluated as a level 2 trauma. Unknown LOC. He is amnestic to the event. W/U in the ED shows L rib FX 3-8 with pleural hematoma. I was asked to see him for admission. His wife and son are present and helped with his history.  Hospital Course:  Trauma workup significant for the below injuries along with their management: Ground level fall Concussion - CT head negative. Ongoing SLP cog therapies L facial contusion and ecchymosis  L facial lacs and abrasion- s/p repair EDPA 10/4. Bacitracin BID. Sutures removed 10/12. L rib FX 3-8 with extrapleural hematoma - F/U CXR with atelectasis, small left pleural effusion, no pneumothorax. Pulmonary toilet, mulltimodal pain control  Density in R middle lung lobe - incidental on CT, needs outpatient follow up Anticoagulation with Eliquis for DVT/PE - H&H remained stable and eliquis was resumed, hgb stable HTN - home meds HLD  On 02/11/21 patients pain was controlled, he was working with TBI therapies, tolerating PO, having bowel function, and felt stable [facility]. He should follow up with his PCP after he recovers regarding R middle lung density. Can follow up with trauma as needed.     Signed: Obie Dredge, [facility] 02/11/2021, 1:16 PM

## 2021-02-12 DIAGNOSIS — S069X1S Unspecified intracranial injury with loss of consciousness of 30 minutes or less, sequela: Secondary | ICD-10-CM | POA: Diagnosis not present

## 2021-02-12 DIAGNOSIS — K5903 Drug induced constipation: Secondary | ICD-10-CM

## 2021-02-12 DIAGNOSIS — A499 Bacterial infection, unspecified: Secondary | ICD-10-CM

## 2021-02-12 DIAGNOSIS — S2242XS Multiple fractures of ribs, left side, sequela: Secondary | ICD-10-CM

## 2021-02-12 DIAGNOSIS — N390 Urinary tract infection, site not specified: Secondary | ICD-10-CM

## 2021-02-12 LAB — COMPREHENSIVE METABOLIC PANEL
ALT: 54 U/L — ABNORMAL HIGH (ref 0–44)
AST: 29 U/L (ref 15–41)
Albumin: 3.2 g/dL — ABNORMAL LOW (ref 3.5–5.0)
Alkaline Phosphatase: 88 U/L (ref 38–126)
Anion gap: 9 (ref 5–15)
BUN: 15 mg/dL (ref 8–23)
CO2: 23 mmol/L (ref 22–32)
Calcium: 9.1 mg/dL (ref 8.9–10.3)
Chloride: 107 mmol/L (ref 98–111)
Creatinine, Ser: 1.11 mg/dL (ref 0.61–1.24)
GFR, Estimated: >60 mL/min (ref >60)
Glucose, Bld: 112 mg/dL — ABNORMAL HIGH (ref 70–99)
Potassium: 3.8 mmol/L (ref 3.5–5.1)
Sodium: 139 mmol/L (ref 135–145)
Total Bilirubin: 0.9 mg/dL (ref 0.3–1.2)
Total Protein: 6.6 g/dL (ref 6.5–8.1)

## 2021-02-12 LAB — CBC WITH DIFFERENTIAL/PLATELET
Abs Immature Granulocytes: 0.05 10*3/uL (ref 0.00–0.07)
Basophils Absolute: 0 10*3/uL (ref 0.0–0.1)
Basophils Relative: 1 %
Eosinophils Absolute: 0.3 10*3/uL (ref 0.0–0.5)
Eosinophils Relative: 5 %
HCT: 38.6 % — ABNORMAL LOW (ref 39.0–52.0)
Hemoglobin: 13.4 g/dL (ref 13.0–17.0)
Immature Granulocytes: 1 %
Lymphocytes Relative: 11 %
Lymphs Abs: 0.7 10*3/uL (ref 0.7–4.0)
MCH: 33.6 pg (ref 26.0–34.0)
MCHC: 34.7 g/dL (ref 30.0–36.0)
MCV: 96.7 fL (ref 80.0–100.0)
Monocytes Absolute: 0.8 10*3/uL (ref 0.1–1.0)
Monocytes Relative: 12 %
Neutro Abs: 4.5 10*3/uL (ref 1.7–7.7)
Neutrophils Relative %: 70 %
Platelets: 229 10*3/uL (ref 150–400)
RBC: 3.99 MIL/uL — ABNORMAL LOW (ref 4.22–5.81)
RDW: 12.7 % (ref 11.5–15.5)
WBC: 6.4 10*3/uL (ref 4.0–10.5)
nRBC: 0 % (ref 0.0–0.2)

## 2021-02-12 MED ORDER — JUVEN PO PACK
1.0000 | PACK | Freq: Two times a day (BID) | ORAL | Status: DC
  Administered 2021-02-12 – 2021-02-28 (×33): 1 via ORAL
  Filled 2021-02-12 (×29): qty 1

## 2021-02-12 MED ORDER — DOXYCYCLINE HYCLATE 100 MG PO TABS
100.0000 mg | ORAL_TABLET | Freq: Two times a day (BID) | ORAL | Status: AC
  Administered 2021-02-12 – 2021-02-16 (×10): 100 mg via ORAL
  Filled 2021-02-12 (×10): qty 1

## 2021-02-12 MED ORDER — METHYLPHENIDATE HCL 5 MG PO TABS
5.0000 mg | ORAL_TABLET | Freq: Two times a day (BID) | ORAL | Status: DC
  Administered 2021-02-12 – 2021-02-28 (×32): 5 mg via ORAL
  Filled 2021-02-12 (×33): qty 1

## 2021-02-12 MED ORDER — QUETIAPINE FUMARATE 25 MG PO TABS
25.0000 mg | ORAL_TABLET | Freq: Every day | ORAL | Status: DC
  Administered 2021-02-12 – 2021-02-20 (×9): 25 mg via ORAL
  Filled 2021-02-12 (×9): qty 1

## 2021-02-12 NOTE — Evaluation (Signed)
Occupational Therapy Assessment and Plan  Patient Details  Name: Brandon Burke MRN: [id number] Date of Birth: 27-May-1931  OT Diagnosis: blindness and low vision, cognitive deficits, and muscle weakness (generalized) Rehab Potential: Rehab Potential (ACUTE ONLY): Good ELOS: 3 weeks   Today's Date: 02/12/2021 OT Individual Time: 0732-0827 OT Individual Time Calculation (min): 55 min     Hospital Problem: Principal Problem:   TBI (traumatic brain injury)   Past Medical History:  Past Medical History:  Diagnosis Date   Constipation    DVT (deep venous thrombosis) ([place])    hx of 2001 (after prostate surgery)     Glaucoma    Hyperlipidemia    Hypertension    Melanoma ([place])    face, hand   Mitral valve prolapse    OSA (obstructive sleep apnea) 11/26/2020   PE (pulmonary embolism)    5-09:was referred to hematology, and they recommend to discontinue Coumadin 5/11   Prostate cancer ([place])     released from urology in 2010,needs yearly PSAs with PCP   Seasonal allergies    Past Surgical History:  Past Surgical History:  Procedure Laterality Date   CATARACT EXTRACTION Bilateral    COLONOSCOPY     several   EYE SURGERY Bilateral    Cat Sx   PARS PLANA VITRECTOMY Left 09/06/2019   Procedure: PARS PLANA VITRECTOMY WITH 25 GAUGE WITH INTRAVITREAL ANTIBIOTICS;  Surgeon: [name], MD;  Location: [place];  Service: Ophthalmology;  Laterality: Left;   PHOTOCOAGULATION WITH LASER Left 09/06/2019   Procedure: Photocoagulation With Laser;  Surgeon: [name], MD;  Location: [place];  Service: Ophthalmology;  Laterality: Left;   PROSTATECTOMY  2000   RUPTURED GLOBE EXPLORATION AND REPAIR Left 08/16/2019   Procedure: OPEN GLOBE EXPLORATION EYE POSSIBLE ANTERIOR  CHAMBER Garber OUT;  Surgeon: Lonia Skinner, MD;  Location: [place];  Service: Ophthalmology;  Laterality: Left;    Assessment & Plan Clinical Impression: Patient is a 87 y.o. year old male with history of prostate cancer,  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 History taken from chart review, wife, and patient due to cognition. Patient lives with spouse at [facility] independent living facility. Independent prior to admission. He presented on 02/03/2021 after unwitnessed fall. Unknown loss of consciousness. He was amnestic to the event. Cranial CT scan of the head, maxillofacial and cervical spine unremarkable for acute intracranial process. No acute fracture or traumatic subluxation of cervical spine. No acute facial fracture. There was noted old left orbital wall fractures. CT of the chest abdomen pelvis showed acute posterior left third through eighth rib fractures. Adjacent pleural thickening/hematoma. No pneumothorax. Left lower patchy airspace disease worrisome for infection. Pulmonary contusion not excluded. No acute localizing process in the abdomen or pelvis. There was an elongated lobulated density in the right middle lobe increased in size since prior comparison plan outpatient follow-up. Patient did sustain left facial laceration and abrasion with repair sutures in the ED. Admission chemistries unremarkable except glucose 128, creatinine 1.30, alcohol negative, lactic acid 2.0. Hospital course further complicated by acute lower UTI. Urine culture greater 100,000 Staphylococcus epidermidis. Patient was cleared to continue chronic Eliquis. Patient did have some urinary retention initially with Foley tube but patient pulled out planning voiding trial. Speech therapy continues to follow for cognitive deficits related to fall impacting memory and problem-solving. Maintain on a regular diet.  Patient transferred to CIR on 02/11/2021 .    Patient currently requires max with basic self-care skills secondary to muscle weakness,  decreased cardiorespiratoy endurance, low vision, decreased motor planning, decreased initiation, decreased attention, decreased awareness, decreased problem solving, decreased safety  awareness, decreased memory, and delayed processing, and decreased sitting balance, decreased standing balance, decreased postural control, and decreased balance strategies.  Prior to hospitalization, patient could complete BADL with supervision.  Patient will benefit from skilled intervention to decrease level of assist with basic self-care skills prior to discharge home with care partner.  Anticipate patient will require 24 hour supervision and follow up home health.  OT - End of Session Endurance Deficit: Yes Endurance Deficit Description: requires frequent seated rest breaks.  c/o fatigue during gait, stairs OT Assessment Rehab Potential (ACUTE ONLY): Good OT Barriers to Discharge: Decreased caregiver support OT Barriers to Discharge Comments: Wife is also elderly and can't provide physical assist. [place]itsory of frequent falls OT Patient demonstrates impairments in the following area(s): Balance;Behavior;Cognition;Edema;Endurance;Motor;Nutrition;Pain;Perception;Safety;Sensory;Skin Integrity;Vision OT Basic ADL's Functional Problem(s): Grooming;Eating;Bathing;Dressing;Toileting OT Transfers Functional Problem(s): Toilet;Tub/Shower OT Additional Impairment(s): None OT Plan OT Intensity: Minimum of 1-2 x/day, 45 to 90 minutes OT Frequency: 5 out of 7 days OT Duration/Estimated Length of Stay: 3 weeks OT Treatment/Interventions: Balance/vestibular training;Cognitive remediation/compensation;Community reintegration;Discharge planning;Disease mangement/prevention;DME/adaptive equipment instruction;Functional mobility training;Pain management;Neuromuscular re-education;Patient/family education;Psychosocial support;Self Care/advanced ADL retraining;Skin care/wound managment;Therapeutic Activities;Therapeutic Exercise;UE/LE Strength taining/ROM;UE/LE Coordination activities;Visual/perceptual remediation/compensation;Wheelchair propulsion/positioning OT Self Feeding Anticipated Outcome(s): Supervision OT  Basic Self-Care Anticipated Outcome(s): Supervision OT Toileting Anticipated Outcome(s): Supervision OT Bathroom Transfers Anticipated Outcome(s): Supervision OT Recommendation Patient destination: Assisted Living Follow Up Recommendations: Home health OT;24 hour supervision/assistance Equipment Recommended: To be determined   OT Evaluation Precautions/Restrictions  Precautions Precautions: Fall General OT Amount of Missed Time: 20 Minutes Pain Pain Assessment Pain Scale: 0-10 Pain Score: 4  Pain Type: Acute pain Pain Location: Rib cage Pain Orientation: Posterior;Left Pain Descriptors / Indicators: Sore Pain Onset: On-going Home Living/Prior Functioning Home Living Family/patient expects to be discharged to:: Assisted living Living Arrangements: Spouse/significant other Available Help at Discharge: Family, Available 24 hours/day Type of Home: Independent living facility Home Access: Level entry Home Layout: One level Bathroom Shower/Tub: Multimedia programmer: Handicapped height Bathroom Accessibility: Yes Additional Comments: History taken per chart review  Lives With: Spouse Prior Function Level of Independence: Independent with basic ADLs, Independent with transfers, Independent with homemaking with ambulation, Independent with gait (per chart review)  Able to Take Stairs?: Yes Driving: No Vocation: Retired Comments: Pt and wife reside at [facility]/History: 3 Glaucoma (blind in L eye, R eye has glaucoma) Ability to See in Adequate Light: 3 Highly impaired Vision Assessment?: Vision impaired- to be further tested in functional context Perception  Perception: Impaired Cognition Overall Cognitive Status: Impaired/Different from baseline Arousal/Alertness: Awake/alert Orientation Level: Person;Place;Situation Person: Oriented Place: Disoriented Situation: Disoriented Year: Other (Comment) (1958) Month: December Day of  Week: Incorrect Memory: Impaired Memory Impairment: Decreased recall of new information;Decreased short term memory Immediate Memory Recall: Sock;Blue;Bed Memory Recall Sock: Not able to recall Memory Recall Blue: Not able to recall Memory Recall Bed: Not able to recall Attention: Focused;Sustained Focused Attention: Impaired Sustained Attention: Impaired Awareness: Impaired Problem Solving: Impaired Executive Function: Organizing;Self Monitoring Organizing: Impaired Self Monitoring: Impaired Safety/Judgment: Impaired [employer] Scales of Cognitive Functioning: Confused/appropriate Sensation Sensation Light Touch: Not tested Proprioception: Not tested Coordination Gross Motor Movements are Fluid and Coordinated:  (unable to assess) Fine Motor Movements are Fluid and Coordinated:  (unable to assess) Motor  Motor Motor: Abnormal postural alignment and control Motor - Skilled Clinical Observations: Generalized weakness  Trunk/Postural Assessment  Cervical Assessment Cervical Assessment: Exceptions to [facility] (forwrd head) Thoracic Assessment Thoracic Assessment: Exceptions to [facility] (rounded shoulders/kyphosis) Lumbar Assessment Lumbar Assessment: Exceptions to [facility] (post tilt) Postural Control Postural Control: Deficits on evaluation Righting Reactions: delayed x 4 Protective Responses: delayed x 4  Balance Dynamic Sitting Balance Sitting balance - Comments: initially mild post tendency in sitting which quickly resolves.  static stand requires min assist due to increased AP sway, dynamic standing see BERG Extremity/Trunk Assessment RUE Assessment Passive Range of Motion (PROM) Comments: WFL Active Range of Motion (AROM) Comments: WFL General Strength Comments: generalized weakness LUE Assessment Passive Range of Motion (PROM) Comments: WFL Active Range of Motion (AROM) Comments: WFL General Strength Comments: Generalized weakness  Care Tool Care Tool Self  Care Eating   Eating Assist Level: Total Assistance - Patient < 25%    Oral Care  Oral care, brush teeth, clean dentures activity did not occur: Refused      Bathing   Body parts bathed by patient: Face Body parts bathed by helper: Right arm;Left arm;Chest;Front perineal area;Abdomen;Buttocks;Left upper leg;Right upper leg;Right lower leg;Left lower leg   Assist Level: Maximal Assistance - Patient 24 - 49%    Upper Body Dressing(including orthotics)   What is the patient wearing?: Pull over shirt   Assist Level: Maximal Assistance - Patient 25 - 49%    Lower Body Dressing (excluding footwear)   What is the patient wearing?: Pants Assist for lower body dressing: Total Assistance - Patient < 25%    Putting on/Taking off footwear   What is the patient wearing?: Non-skid slipper socks Assist for footwear: Total Assistance - Patient < 25%       Care Tool Toileting Toileting activity Toileting Activity did not occur (Clothing management and hygiene only): N/A (no void or bm)       Care Tool Bed Mobility Roll left and right activity   Roll left and right assist level: Moderate Assistance - Patient 50 - 74%    Sit to lying activity   Sit to lying assist level: Moderate Assistance - Patient 50 - 74%    Lying to sitting on side of bed activity Lying to sitting on side of bed activity did not occur: the ability to move from lying on the back to sitting on the side of the bed with no back support.: Safety/medical concerns (Pt unable to achieve alertness to perform bed mobility)      Care Tool Transfers Sit to stand transfer Sit to stand activity did not occur: Safety/medical concerns (Pt unable to maintian alertness) Sit to stand assist level: Minimal Assistance - Patient > 75%    Chair/bed transfer Chair/bed transfer activity did not occur: Safety/medical concerns (Pt unable to maintian alertness) Chair/bed transfer assist level: Minimal Assistance - Patient > 75%     Toilet  transfer Toilet transfer activity did not occur: Safety/medical concerns (Pt unable to maintian alertness)       Care Tool Cognition  Expression of Ideas and Wants Expression of Ideas and Wants: 2. Frequent difficulty - frequently exhibits difficulty with expressing needs and ideas  Understanding Verbal and Non-Verbal Content Understanding Verbal and Non-Verbal Content: 2. Sometimes understands - understands only basic conversations or simple, direct phrases. Frequently requires cues to understand   Memory/Recall Ability Memory/Recall Ability : None of the above were recalled   Refer to Care Plan for Long Term Goals  SHORT TERM GOAL WEEK 1 OT Short Term Goal 1 (Week 1): Patient will complete UB dressing with  Min A OT Short Term Goal 2 (Week 1): Patient will ambulate to the bathroom with min A OT Short Term Goal 3 (Week 1): Patient will sequence 2 steps of grooming task with min questioning cues  Recommendations for other services: None    Skilled Therapeutic Intervention OT eval completed addressing rehab process, OT purpose, POC, ELOS, and goals.  Limited ADL assessment secondary to decreased arousal and pt participation. Pt lethargic as he was up all night trying to get out of bed per nursing report. Pt would intermittently open his eyes, but would follow 1 step commands intermittently for bathing/dressing at bed level. Total A for all bathing/dressing except for washing his face. Pt was able to follow commands to bridge his hips for OT to help pull pants up over hips. When performing rolling, OT provided hand over hand A and mod A with pt very gaurded and wincing in pain. Pt unable to state where pain was located. Attempted environmental changes with bright light, but pt only covered his eyes. Pt needed total A for feeding and would open his mouth to take a bite, chew, and swallow, but would not self-feed or initiate feeding. Pt left semi-reclined in bed at end of session with call bell in  reach, bed alarm on, and needs met.   ADL ADL Eating: Maximal assistance Grooming: Maximal assistance Upper Body Bathing: Maximal assistance Lower Body Bathing: Dependent Upper Body Dressing: Maximal assistance Lower Body Dressing: Dependent Toileting: Dependent Toilet Transfer: Unable to assess Mobility  Bed Mobility Bed Mobility: Rolling Right;Rolling Left Rolling Right: Moderate Assistance - Patient 50-74% Rolling Left: Moderate Assistance - Patient 50-74%   Discharge Criteria: Patient will be discharged from OT if patient refuses treatment 3 consecutive times without medical reason, if treatment goals not met, if there is a change in medical status, if patient makes no progress towards goals or if patient is discharged from hospital.  The above assessment, treatment plan, treatment alternatives and goals were discussed and mutually agreed upon: No family available/patient unable  Valma Cava 02/12/2021, 1:45 PM

## 2021-02-12 NOTE — Progress Notes (Addendum)
PROGRESS NOTE   Subjective/Complaints: Pt lying in bed with hands over head. Indicates that light is bothering him and that he has a headache? Nurse reports that he slept little last night and that he was quite confused  ROS: Limited due to cognitive/behavioral    Objective:   No results found. Recent Labs    02/09/21 1027 02/12/21 0508  WBC 8.3 6.4  HGB 13.9 13.4  HCT 40.7 38.6*  PLT 198 229   Recent Labs    02/09/21 1027 02/12/21 0508  NA 138 139  K 4.0 3.8  CL 105 107  CO2 23 23  GLUCOSE 123* 112*  BUN 20 15  CREATININE 1.07 1.11  CALCIUM 8.8* 9.1    Intake/Output Summary (Last 24 hours) at 02/12/2021 9417 Last data filed at 02/12/2021 4081 Gross per 24 hour  Intake 300 ml  Output 2 ml  Net 298 ml        Physical Exam: Vital Signs Blood pressure 132/66, pulse 77, temperature 99.3 F (37.4 C), temperature source Oral, resp. rate 18, height 6' 0.01" (1.829 m), weight 77.6 kg, SpO2 100 %.  General: hands over head, sensitive to light HEENT: Head is normocephalic, atraumatic, oral mucosa pink and moist, dentition intact, ext ear canals clear, left eye blind, cataracts/glaucoma right Neck: Supple without JVD or lymphadenopathy Heart: Reg rate and rhythm. No murmurs rubs or gallops Chest: CTA bilaterally without wheezes, rales, or rhonchi; no distress Abdomen: Soft, non-tender, non-distended, bowel sounds positive. Extremities: No clubbing, cyanosis, or edema. Pulses are 2+ Psych: Pt's affect is appropriate. Pt is cooperative Skin: Clean and intact without signs of breakdown Neuro:  alert to self. Answers almost every question "uh huh" . Did shake his head to help answer some questions later. Did follow some simple commands. Visual acuity difficult to assess. Seems to move all 4 limbs but tends to move the right side more automatically and seems 4+to 5/5 on right whereas he's 4/5 on left Musculoskeletal:  no pain with PROM or what AROM he could do for me today    Assessment/Plan: 1. Functional deficits which require 3+ hours per day of interdisciplinary therapy in a comprehensive inpatient rehab setting. Physiatrist is providing close team supervision and 24 hour management of active medical problems listed below. Physiatrist and rehab team continue to assess barriers to discharge/monitor patient progress toward functional and medical goals  Care Tool:  Bathing    Body parts bathed by patient: Face   Body parts bathed by helper: Right arm, Left arm, Chest, Front perineal area, Abdomen, Buttocks, Left upper leg, Right upper leg, Right lower leg, Left lower leg     Bathing assist Assist Level: Total Assistance - Patient < 25%     Upper Body Dressing/Undressing Upper body dressing   What is the patient wearing?: Pull over shirt    Upper body assist Assist Level: Total Assistance - Patient < 25%    Lower Body Dressing/Undressing Lower body dressing      What is the patient wearing?: Pants     Lower body assist Assist for lower body dressing: Total Assistance - Patient < 25%     Naval architect Activity  did not occur Landscape architect and hygiene only): N/A (no void or bm)  Toileting assist Assist for toileting: Maximal Assistance - Patient 25 - 49%     Transfers Chair/bed transfer  Transfers assist  Chair/bed transfer activity did not occur: Safety/medical concerns (Pt unable to maintian alertness)  Chair/bed transfer assist level: 2 Helpers     Locomotion Ambulation   Ambulation assist              Walk 10 feet activity   Assist           Walk 50 feet activity   Assist           Walk 150 feet activity   Assist           Walk 10 feet on uneven surface  activity   Assist           Wheelchair     Assist               Wheelchair 50 feet with 2 turns activity    Assist             Wheelchair 150 feet activity     Assist          Blood pressure 132/66, pulse 77, temperature 99.3 F (37.4 C), temperature source Oral, resp. rate 18, height 6' 0.01" (1.829 m), weight 77.6 kg, SpO2 100 %.  Medical Problem List and Plan: 1.  TBI/concussion secondary to fall 02/03/2021.  Cranial CT scan negative.             -patient may shower             -ELOS/Goals: 9-15 days/supervision             Patient is beginning CIR therapies today including PT and OT SLP 2.  Antithrombotics: -DVT/anticoagulation:  Pharmaceutical: Other (comment)/Eliquis             -antiplatelet therapy: N/A 3. Pain Management: Lidoderm patch, Robaxin as needed, Ultram as needed             Monitor with increased exertion 4. Mood/sleep/mental status: Provide emotional support             -antipsychotic agents: N/A  -sleep chart  -begin scheduled seroquel at 2100  -add ritalin to help with attention and focus during the day  -ucx + on 10/8 for 100k staph epi   -pan sensitive---doxy for 5 days 5. Neuropsych: This patient is not capable of making decisions on his own behalf. 6. Skin/Wound Care: Left facial laceration or abrasion.  Sutures removed.  Routine skin checks 7. Fluids/Electrolytes/Nutrition: Routine in and outs             encourage PO  I personally reviewed all of the patient's labs today, and lab work is within normal limits except for albumin and ALT   -add protein supp   -the remaining LFT's wnl 8.  Multiple left rib fractures with extrapleural hematoma.  Conservative management 9.  Density right middle lung lobe.  Incidental on CT.  Follow-up outpatient 10.  History of DVT/pulmonary emboli.  Eliquis 11.  Essential hypertension: Tenormin 50 mg daily 12.  BPH.  Flomax 0.4 mg daily.  Check PVR's 13.  Hyperlipidemia.  Pravachol 14.  Hypothyroidism.  Synthroid 15.  Drug-induced constipation.  MiraLAX daily, Colace 100 mg twice daily             Adjust bowel meds as necessary   -had  bm yesterday  LOS: 1 days A FACE TO FACE EVALUATION WAS PERFORMED  Meredith Staggers 02/12/2021, 9:21 AM

## 2021-02-12 NOTE — Discharge Instructions (Addendum)
Inpatient Rehab Discharge Instructions  [place] Discharge date and time: No discharge date for patient encounter.   Activities/Precautions/ Functional Status: Activity: activity as tolerated Diet: Regular Wound Care: Routine skin checks Functional status:  ___ No restrictions     ___ Walk up steps independently ___ 24/7 supervision/assistance   ___ Walk up steps with assistance ___ Intermittent supervision/assistance  ___ Bathe/dress independently ___ Walk with walker     __x_ Bathe/dress with assistance ___ Walk Independently    ___ Shower independently ___ Walk with assistance    ___ Shower with assistance ___ No alcohol     ___ Return to work/school ________  COMMUNITY REFERRALS UPON DISCHARGE:    Home Health:   PT     OT     ST                    Agency: [employer] Phone: [phone] *Please expect follow-up within 2-3 days to schedule the home visit. If you have not received follow-up, be sure to contact the branch directly.*    Special Instructions:  No driving smoking or alcohol  My questions have been answered and I understand these instructions. I will adhere to these goals and the provided educational materials after my discharge from the hospital.  Patient/Caregiver Signature _______________________________ Date __________  Clinician Signature _______________________________________ Date __________  Please bring this form and your medication list with you to all your follow-up doctor's appointments.    Information on my medicine - ELIQUIS (apixaban)  This medication education was reviewed with me or my healthcare representative as part of my discharge preparation.  The pharmacist that spoke with me during my hospital stay was:    Why was Eliquis prescribed for you? Eliquis was prescribed to treat blood clots that may have been found in the veins of your legs (deep vein thrombosis) or in your lungs (pulmonary embolism) and to reduce the risk of  them occurring again.  What do You need to know about Eliquis ? Continue Eliquis 2.5mg  TWICE daily.  Eliquis may be taken with or without food.   Try to take the dose about the same time in the morning and in the evening. If you have difficulty swallowing the tablet whole please discuss with your pharmacist how to take the medication safely.  Take Eliquis exactly as prescribed and DO NOT stop taking Eliquis without talking to the doctor who prescribed the medication.  Stopping may increase your risk of developing a new blood clot.  Refill your prescription before you run out.  After discharge, you should have regular check-up appointments with your healthcare provider that is prescribing your Eliquis.    What do you do if you miss a dose? If a dose of ELIQUIS is not taken at the scheduled time, take it as soon as possible on the same day and twice-daily administration should be resumed. The dose should not be doubled to make up for a missed dose.  Important Safety Information A possible side effect of Eliquis is bleeding. You should call your healthcare provider right away if you experience any of the following: Bleeding from an injury or your nose that does not stop. Unusual colored urine (red or dark brown) or unusual colored stools (red or black). Unusual bruising for unknown reasons. A serious fall or if you hit your head (even if there is no bleeding).  Some medicines may interact with Eliquis and might increase your risk of bleeding or clotting while on Eliquis.  To help avoid this, consult your healthcare provider or pharmacist prior to using any new prescription or non-prescription medications, including herbals, vitamins, non-steroidal anti-inflammatory drugs (NSAIDs) and supplements.  This website has more information on Eliquis (apixaban): http://[email or web address].com/eliquis/home

## 2021-02-12 NOTE — Progress Notes (Signed)
Patient has had a rough night with little sleep. Patient has been impulsive getting out of bed stating he needs to get in his car, he needs to get on a flight, etc. When trying to find out where patient is wanting to go he states he is in [place], [place]. or [place]. Patient does not have any medication to help sleep or to calm his stress or anxiety when he gets worked up and becomes upset or implusive.

## 2021-02-12 NOTE — Progress Notes (Signed)
Patient anxious and agitated at times throughout the day. Wife and brother at bedside. Patient easily redirected by family. C/O pain throughout the day, PRN tylenol and PRN Ultram given per order. Patient remains on Tele-sitter for additional safety. Call light in reach, bed in low position, safety devices within reach.

## 2021-02-12 NOTE — Evaluation (Signed)
Physical Therapy Assessment and Plan  Patient Details  Name: Brandon Burke MRN: [id number] Date of Birth: January 13, 1932  PT Diagnosis: Abnormality of gait and Cognitive deficits Rehab Potential: Good ELOS: 18-21 days   Today's Date: 02/12/2021 PT Individual Time: 1100-1200 PT Individual Time Calculation (min): 60 min    Hospital Problem: Principal Problem:   TBI (traumatic brain injury)   Past Medical History:  Past Medical History:  Diagnosis Date   Constipation    DVT (deep venous thrombosis) ([place])    hx of 2001 (after prostate surgery)     Glaucoma    Hyperlipidemia    Hypertension    Melanoma ([place])    face, hand   Mitral valve prolapse    OSA (obstructive sleep apnea) 11/26/2020   PE (pulmonary embolism)    5-09:was referred to hematology, and they recommend to discontinue Coumadin 5/11   Prostate cancer ([place])     released from urology in 2010,needs yearly PSAs with PCP   Seasonal allergies    Past Surgical History:  Past Surgical History:  Procedure Laterality Date   CATARACT EXTRACTION Bilateral    COLONOSCOPY     several   EYE SURGERY Bilateral    Cat Sx   PARS PLANA VITRECTOMY Left 09/06/2019   Procedure: PARS PLANA VITRECTOMY WITH 25 GAUGE WITH INTRAVITREAL ANTIBIOTICS;  Surgeon: [name], MD;  Location: [facility];  Service: Ophthalmology;  Laterality: Left;   PHOTOCOAGULATION WITH LASER Left 09/06/2019   Procedure: Photocoagulation With Laser;  Surgeon: [name], MD;  Location: [facility];  Service: Ophthalmology;  Laterality: Left;   PROSTATECTOMY  2000   RUPTURED GLOBE EXPLORATION AND REPAIR Left 08/16/2019   Procedure: OPEN GLOBE EXPLORATION EYE POSSIBLE ANTERIOR  CHAMBER [place] OUT;  Surgeon: Lonia Skinner, MD;  Location: [facility];  Service: Ophthalmology;  Laterality: Left;    Assessment & Plan Clinical Impression: Brandon Burke is an 87 year old right-handed male with history of prostate cancer, 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  History taken from chart review, wife, and patient due to cognition. Patient lives with spouse at [facility] independent living facility. Independent prior to admission. He presented on 02/03/2021 after unwitnessed fall. Unknown loss of consciousness. He was amnestic to the event. Cranial CT scan of the head, maxillofacial and cervical spine unremarkable for acute intracranial process. No acute fracture or traumatic subluxation of cervical spine. No acute facial fracture. There was noted old left orbital wall fractures. CT of the chest abdomen pelvis showed acute posterior left third through eighth rib fractures. Adjacent pleural thickening/hematoma. No pneumothorax. Left lower patchy airspace disease worrisome for infection. Pulmonary contusion not excluded. No acute localizing process in the abdomen or pelvis. There was an elongated lobulated density in the right middle lobe increased in size since prior comparison plan outpatient follow-up. Patient did sustain left facial laceration and abrasion with repair sutures in the ED. Admission chemistries unremarkable except glucose 128, creatinine 1.30, alcohol negative, lactic acid 2.0. Hospital course further complicated by acute lower UTI. Urine culture greater 100,000 Staphylococcus epidermidis. Patient was cleared to continue chronic Eliquis. Patient did have some urinary retention initially with Foley tube but patient pulled out planning voiding trial. Speech therapy continues to follow for cognitive deficits related to fall impacting memory and problem-solving. Maintain on a regular diet. Therapy evaluations completed due to patient decreased functional mobility was admitted for a comprehensive rehab program. .  Patient transferred to CIR on 02/11/2021 .   Patient currently requires mod assist  w/bed mobility, min to mod assist w/transfers and gait secondary to muscle weakness, decreased cardiorespiratoy endurance, decreased motor planning, decreased  visual acuity and decreased visual motor skills, and decreased initiation, decreased attention, decreased awareness, decreased problem solving, decreased safety awareness, and decreased memory.  Prior to hospitalization, patient was independent  with mobility and lived with Spouse in a Independent living facility home.  Home access is  Level entry.  Patient will benefit from skilled PT intervention to maximize safe functional mobility, minimize fall risk, and decrease caregiver burden for planned discharge home with 24 hour supervision.  Anticipate patient will benefit from follow up [place] at discharge.  PT - End of Session Activity Tolerance: Tolerates 30+ min activity with multiple rests Endurance Deficit: Yes Endurance Deficit Description: requires frequent seated rest breaks.  c/o fatigue during gait, stairs PT Assessment Rehab Potential (ACUTE/IP ONLY): Good PT Barriers to Discharge: Decreased caregiver support;Other (comments) PT Barriers to Discharge Comments: cognition PT Patient demonstrates impairments in the following area(s): Balance;Safety;Behavior;Skin Integrity;Endurance;Motor;Pain;Perception PT Transfers Functional Problem(s): Bed Mobility;Bed to Chair;Car;Furniture PT Locomotion Functional Problem(s): Ambulation;Wheelchair Mobility;Stairs PT Plan PT Intensity: Minimum of 1-2 x/day ,45 to 90 minutes PT Frequency: 5 out of 7 days PT Duration Estimated Length of Stay: 18-21 days PT Treatment/Interventions: Ambulation/gait training;Community reintegration;DME/adaptive equipment instruction;Neuromuscular re-education;Psychosocial support;Stair training;Balance/vestibular training;Discharge planning;Pain management;Therapeutic Activities;Cognitive remediation/compensation;Functional mobility training;Patient/family education;Therapeutic Exercise;Visual/perceptual remediation/compensation;UE/LE Coordination activities;UE/LE Strength taining/ROM;Wheelchair propulsion/positioning PT  Transfers Anticipated Outcome(s): supervision PT Locomotion Anticipated Outcome(s): supervision PT Recommendation Follow Up Recommendations: Home health PT Patient destination: Home Equipment Recommended: To be determined   PT Evaluation Precautions/Restrictions Precautions Precautions: Fall General   Vital Signs  Pain Pain Assessment Pain Scale: 0-10 Pain Score: 4  Faces Pain Scale: No hurt Pain Type: Acute pain Pain Location: Rib cage Pain Orientation: Posterior;Left Pain Descriptors / Indicators: Sore Pain Onset: On-going PAINAD (Pain Assessment in Advanced Dementia) Breathing: normal Pain Interference Pain Interference Pain Effect on Sleep: 8. Unable to answer Pain Interference with Therapy Activities: 8. Unable to answer Pain Interference with Day-to-Day Activities: 3. Frequently Home Living/Prior Functioning Home Living Available Help at Discharge: Family;Available 24 hours/day Type of Home: Independent living facility Home Access: Level entry Home Layout: One level Bathroom Shower/Tub: Multimedia programmer: Handicapped height Bathroom Accessibility: Yes  Lives With: Spouse Prior Function Level of Independence: Independent with basic ADLs;Independent with transfers;Independent with homemaking with ambulation;Independent with gait  Able to Take Stairs?: Yes Driving: No Vocation: Retired Comments: Pt and wife reside at [facility] Vision/Perception     Cognition Overall Cognitive Status: Impaired/Different from baseline Arousal/Alertness: Awake/alert Orientation Level: Oriented to person;Disoriented to place;Disoriented to time;Disoriented to situation Year: 2022 Month: September Day of Week: Incorrect Attention: Focused;Sustained Focused Attention: Impaired Sustained Attention: Impaired Memory: Impaired Memory Impairment: Decreased recall of new information;Decreased short term memory Sensation Sensation Light Touch: Appears  Intact Proprioception: Appears Intact Motor  Motor Motor: Abnormal postural alignment and control Motor - Skilled Clinical Observations: generalized weakness x 4 exts, functional weakness bilat LEs w/stairs, transfers, gait   Trunk/Postural Assessment  Cervical Assessment Cervical Assessment: Exceptions to [facility] (forwrd head) Thoracic Assessment Thoracic Assessment: Exceptions to [facility] (rounded shoulders/kyphosis) Lumbar Assessment Lumbar Assessment: Exceptions to [facility] (post tilt) Postural Control Postural Control: Deficits on evaluation Righting Reactions: delayed x 4 Protective Responses: delayed x 4  Balance Dynamic Sitting Balance Sitting balance - Comments: initially mild post tendency in sitting which quickly resolves.  static stand requires min assist due to increased AP sway, dynamic standing see BERG Extremity Assessment  RUE Assessment Passive Range of Motion (PROM) Comments: WFL Active Range of Motion (AROM) Comments: WFL General Strength Comments: generalized weakness LUE Assessment Passive Range of Motion (PROM) Comments: WFL Active Range of Motion (AROM) Comments: WFL General Strength Comments: Generalized weakness RLE Assessment Passive Range of Motion (PROM) Comments: wfl Active Range of Motion (AROM) Comments: wfl General Strength Comments: grossly 4/5, functional weakness at hips and knees noted w/gait and stairs LLE Assessment Passive Range of Motion (PROM) Comments: wfl Active Range of Motion (AROM) Comments: wfl General Strength Comments: generalized weakness, functional weakness during gait/transfers/stairs at hips and knees   R=L  Care Tool Care Tool Bed Mobility Roll left and right activity   Roll left and right assist level: Moderate Assistance - Patient 50 - 74%    Sit to lying activity   Sit to lying assist level: Moderate Assistance - Patient 50 - 74%    Lying to sitting on side of bed activity   Lying to sitting on side of bed assist level: the  ability to move from lying on the back to sitting on the side of the bed with no back support.: Moderate Assistance - Patient 50 - 74%     Care Tool Transfers Sit to stand transfer   Sit to stand assist level: Minimal Assistance - Patient > 75%    Chair/bed transfer   Chair/bed transfer assist level: Minimal Assistance - Patient > 75%     Physiological scientist transfer assist level: Minimal Assistance - Patient > 75%      Care Tool Locomotion Ambulation   Assist level: 2 helpers Assistive device: Hand held assist Max distance: 100  Walk 10 feet activity   Assist level: Minimal Assistance - Patient > 75% Assistive device: Hand held assist   Walk 50 feet with 2 turns activity   Assist level: 2 helpers Assistive device: Hand held assist  Walk 150 feet activity Walk 150 feet activity did not occur: Safety/medical concerns      Walk 10 feet on uneven surfaces activity   Assist level: Minimal Assistance - Patient > 75% Assistive device: Hand held assist;Other (comment) (and use of handrail)  Stairs   Assist level: Minimal Assistance - Patient > 75% Stairs assistive device: 2 hand rails Max number of stairs: 4  Walk up/down 1 step activity   Walk up/down 1 step (curb) assist level: Moderate Assistance - Patient - 50 - 74% Walk up/down 1 step or curb assistive device: 1 hand rail    Walk up/down 4 steps activity Walk up/down 4 steps assist level: Minimal Assistance - Patient > 75% Walk up/down 4 steps assistive device: 2 hand rails  Walk up/down 12 steps activity Walk up/down 12 steps activity did not occur: Safety/medical concerns      Pick up small objects from floor Pick up small object from the floor (from standing position) activity did not occur: Safety/medical concerns      Wheelchair Is the patient using a wheelchair?: Yes Type of Wheelchair: Manual   Wheelchair assist level: Total Assistance - Patient < 25% Max wheelchair distance: 150   Wheel 50 feet with 2 turns activity   Assist Level: Total Assistance - Patient < 25%  Wheel 150 feet activity   Assist Level: Total Assistance - Patient < 25%    Refer to Care Plan for Long Term Goals  SHORT TERM GOAL WEEK 1 PT Short Term Goal 1 (Week 1):  Pt will perform supine to/from sit w/min assist PT Short Term Goal 2 (Week 1): Pt will perform basic transfers w/cga and minimal cueing for safety PT Short Term Goal 3 (Week 1): Pt will ambulate 151f w/cga to min assist in environment w/minimal to no distractions and no AD PT Short Term Goal 4 (Week 1): Pt will ambulat >1532fw/LRAD in busy environment including turns w/cga  Recommendations for other services: None   Skilled Therapeutic Intervention   Evaluation completed (see details above and below) with education on PT POC and goals and individual treatment initiated with focus on functional mobility/transfers, LE strength, dynamic standing balance/coordination, ambulation, stair navigation, simulated car transfers, and improved endurance with activity Pt was oriented to rehab unit.  Discussed process of daily scheduling and receiving schedule, purpose of team conference/ daily schedule and D/c planning.  Pt provided w/ 18x18 wheelchair and cushion and adjustments made to promote optimal seating posture and pressure distribution.  Pt also provided w/ RW and therapist adjusted to proper height for patient. Wife asked multiple appropriate questions regarding scheduling of therapy/meals, concern over cognitive issues especially last PM (as documented in chart).  Educated on schedule, importance of routine, need for pt to adjust to new surroundings and overstimulation commonly associated w/change in setting, placement of pt on brain injury team to fully assess and appropriately treat current deficits.   At end of session, Pt left oob in wc w/alarm belt set and needs in reach.  Pt and family educated on use of alarm belt for pt safety, family  agreed/understood purpose.     Mobility Bed Mobility Bed Mobility: Rolling Right;Rolling Left;Supine to Sit;Sitting - Scoot to [employer] Bed;Sit to Supine Rolling Right: Minimal Assistance - Patient > 75% Rolling Left: Minimal Assistance - Patient > 75% Supine to Sit: Moderate Assistance - Patient 50-74% Sitting - Scoot to Edge of Bed: Minimal Assistance - Patient > 75% Sit to Supine: Minimal Assistance - Patient > 75% Transfers Transfers: Sit to Stand;Stand to Sit;Stand Pivot Transfers Sit to Stand: Minimal Assistance - Patient > 75% Stand to Sit: Minimal Assistance - Patient > 75% Stand Pivot Transfers: Minimal Assistance - Patient > 75% Stand Pivot Transfer Details: Verbal cues for sequencing;Tactile cues for sequencing;Tactile cues for initiation Stand Pivot Transfer Details (indicate cue type and reason): min assist for balance as well as above Transfer (Assistive device): None Locomotion  Gait Ambulation: Yes Gait Assistance: Minimal Assistance - Patient > 75% Gait Distance (Feet): 110 Feet Assistive device: 1 person hand held assist Gait Assistance Details: Pt requires min assist for balance, initially +2 HHA fading to +1 HHA, loses balance w/turning, head turns, distractions from busy environment. Gait Gait: Yes Gait Pattern: Impaired (mild shuffling, stooped posture, general instability w/distractions/turns/head turnds/nods)   Discharge Criteria: Patient will be discharged from PT if patient refuses treatment 3 consecutive times without medical reason, if treatment goals not met, if there is a change in medical status, if patient makes no progress towards goals or if patient is discharged from hospital.  The above assessment, treatment plan, treatment alternatives and goals were discussed and mutually agreed upon: by patient and by family BaCallie FieldingPTDrexel Hill0/13/2022, 12:36 PM

## 2021-02-12 NOTE — Evaluation (Signed)
Speech Language Pathology Assessment and Plan  Patient Details  Name: Brandon Burke MRN: [id number] Date of Birth: Sep 30, 1931  SLP Diagnosis: Cognitive Impairments  Rehab Potential: Excellent ELOS: 3 weeks    Today's Date: 02/12/2021 SLP Individual Time: 1400-1455 SLP Individual Time Calculation (min): 82 min   Hospital Problem: Principal Problem:   TBI (traumatic brain injury)  Past Medical History:  Past Medical History:  Diagnosis Date   Constipation    DVT (deep venous thrombosis) ([place])    hx of 2001 (after prostate surgery)     Glaucoma    Hyperlipidemia    Hypertension    Melanoma ([place])    face, hand   Mitral valve prolapse    OSA (obstructive sleep apnea) 11/26/2020   PE (pulmonary embolism)    5-09:was referred to hematology, and they recommend to discontinue Coumadin 5/11   Prostate cancer ([place])     released from urology in 2010,needs yearly PSAs with PCP   Seasonal allergies    Past Surgical History:  Past Surgical History:  Procedure Laterality Date   CATARACT EXTRACTION Bilateral    COLONOSCOPY     several   EYE SURGERY Bilateral    Cat Sx   PARS PLANA VITRECTOMY Left 09/06/2019   Procedure: PARS PLANA VITRECTOMY WITH 25 GAUGE WITH INTRAVITREAL ANTIBIOTICS;  Surgeon: [name], MD;  Location: [facility];  Service: Ophthalmology;  Laterality: Left;   PHOTOCOAGULATION WITH LASER Left 09/06/2019   Procedure: Photocoagulation With Laser;  Surgeon: [name], MD;  Location: [facility];  Service: Ophthalmology;  Laterality: Left;   PROSTATECTOMY  2000   RUPTURED GLOBE EXPLORATION AND REPAIR Left 08/16/2019   Procedure: OPEN GLOBE EXPLORATION EYE POSSIBLE ANTERIOR  CHAMBER [place] OUT;  Surgeon: Lonia Skinner, MD;  Location: [place];  Service: Ophthalmology;  Laterality: Left;    Assessment / Plan / Recommendation Clinical Impression Patient is an 87 year old right-handed male with history of prostate cancer, DVT/PE maintained on Eliquis, hypertension, and MVP.    He presented on 02/03/2021 after unwitnessed fall.  Unknown loss of consciousness.  He was amnestic to the event.  Cranial CT scan of the head, maxillofacial and cervical spine unremarkable for acute intracranial process.  No acute fracture or traumatic subluxation of cervical spine.  No acute facial fracture.  There was noted old left orbital wall fractures.  CT of the chest abdomen pelvis showed acute posterior left third through eighth rib fractures.  Adjacent pleural thickening/hematoma.  No pneumothorax.  Left lower patchy airspace disease worrisome for infection.  Pulmonary contusion not excluded.   Patient did sustain left facial laceration and abrasion with repair sutures in the ED.  Therapy evaluations completed with recommendations for CIR. Patient admitted for a comprehensive rehab program 02/11/21.  Patient's overall level of arousal and general orientation fluctuates throughout the day but currently, patient demonstrates behaviors consistent with a Rancho Level VI. Patient requires overall Mod A multimodal cues to complete functional and familiar tasks for orientation to date, sustained attention, recall of functional information, basic problem solving and awareness of errors which impacts his safety with functional and familiar tasks.  SLP administered portions of the Cognistat. Patient scored [facility] for orientation and calculations but severe impairments in short-term recall and judgment. Patient's auditory comprehension and verbal expression appeared [facility] for tasks assessed. Patient would benefit from skilled SLP intervention to maximize his cognitive functioning and overall functional independence prior to discharge.    Skilled Therapeutic Interventions  Administered a cognitive-linguistic evaluation, please see above for details.   SLP Assessment  Patient will need skilled [place] [facility] Services during CIR admission    Recommendations  Oral Care Recommendations: Oral care  BID Recommendations for Other Services: Neuropsych consult Patient destination: Home Follow up Recommendations: 24 hour supervision/assistance;Home Health SLP;Outpatient SLP Equipment Recommended: None recommended by SLP    SLP Frequency 3 to 5 out of 7 days   SLP Duration  SLP Intensity  SLP Treatment/Interventions 3 weeks  Minumum of 1-2 x/day, 30 to 90 minutes  Cognitive remediation/compensation;Internal/external aids;Therapeutic Activities;Environmental controls;Cueing hierarchy;Functional tasks;Patient/family education    Pain Pain Assessment Pain Scale: 0-10 Pain Score: 4  Faces Pain Scale: No hurt Pain Type: Acute pain Pain Location: Rib cage Pain Orientation: Posterior;Left Pain Descriptors / Indicators: Sore Pain Onset: On-going  Prior Functioning Type of Home: Independent living facility  Lives With: Spouse Available Help at Discharge: Family;Available 24 hours/day Vocation: Retired  Programmer, systems Overall Cognitive Status: Impaired/Different from baseline Arousal/Alertness: Awake/alert Orientation Level: Oriented to person;Oriented to place;Oriented to situation;Disoriented to time Year: Other (Comment) (5465) Month: December Day of Week: Incorrect Attention: Focused;Sustained Focused Attention: Appears intact Sustained Attention: Impaired Sustained Attention Impairment: Verbal basic;Functional basic Memory: Impaired Memory Impairment: Decreased recall of new information;Decreased short term memory;Retrieval deficit Immediate Memory Recall: Sock;Blue;Bed Memory Recall Sock: Not able to recall Memory Recall Blue: Not able to recall Memory Recall Bed: Not able to recall Awareness: Impaired Awareness Impairment: Intellectual impairment Problem Solving: Impaired Problem Solving Impairment: Verbal basic;Functional basic Executive Function: Organizing;Self Monitoring Organizing: Impaired Self Monitoring: Impaired Safety/Judgment:  Impaired Rancho [employer] Scales of Cognitive Functioning: Confused/appropriate  Comprehension Auditory Comprehension Overall Auditory Comprehension: Appears within functional limits for tasks assessed Expression Expression Primary Mode of Expression: Verbal Verbal Expression Overall Verbal Expression: Appears within functional limits for tasks assessed Oral Motor Oral Motor/Sensory Function Overall Oral Motor/Sensory Function: Within functional limits Motor Speech Overall Motor Speech: Appears within functional limits for tasks assessed  Care Tool Care Tool Cognition Ability to hear (with hearing aid or hearing appliances if normally used Ability to hear (with hearing aid or hearing appliances if normally used): 1. Minimal difficulty - difficulty in some environments (e.g. when person speaks softly or setting is noisy)   Expression of Ideas and Wants Expression of Ideas and Wants: 2. Frequent difficulty - frequently exhibits difficulty with expressing needs and ideas   Understanding Verbal and Non-Verbal Content Understanding Verbal and Non-Verbal Content: 2. Sometimes understands - understands only basic conversations or simple, direct phrases. Frequently requires cues to understand  Memory/Recall Ability Memory/Recall Ability : That he or she is in a hospital/hospital unit     Short Term Goals: Week 1: SLP Short Term Goal 1 (Week 1): Patient will utilize external memory aids for orientation X 4 with Min verbal and visual cues. SLP Short Term Goal 2 (Week 1): Patient will demonstrate functional problem solving for basic and familiar tasks with Min verbal cues. SLP Short Term Goal 3 (Week 1): Patient will demosntrate sustained attention to functional tasks for 15 minutes with Min verbal cues for redirection.  Refer to Care Plan for Long Term Goals  Recommendations for other services: Neuropsych  Discharge Criteria: Patient will be discharged from SLP if patient refuses treatment  3 consecutive times without medical reason, if treatment goals not met, if there is a change in medical status, if patient makes no progress towards goals or if patient is discharged from hospital.  The above assessment, treatment  plan, treatment alternatives and goals were discussed and mutually agreed upon: by patient and by family  Armel Rabbani 02/12/2021, 4:00 PM

## 2021-02-12 NOTE — Plan of Care (Signed)
Problem: RH Balance Goal: LTG: Patient will maintain dynamic sitting balance (OT) Description: LTG:  Patient will maintain dynamic sitting balance with assistance during activities of daily living (OT) Flowsheets (Taken 02/12/2021 1337) LTG: Pt will maintain dynamic sitting balance during ADLs with: Supervision/Verbal cueing Goal: LTG Patient will maintain dynamic standing with ADLs (OT) Description: LTG:  Patient will maintain dynamic standing balance with assist during activities of daily living (OT)  Flowsheets (Taken 02/12/2021 1337) LTG: Pt will maintain dynamic standing balance during ADLs with: Supervision/Verbal cueing   Problem: Sit to Stand Goal: LTG:  Patient will perform sit to stand in prep for activites of daily living with assistance level (OT) Description: LTG:  Patient will perform sit to stand in prep for activites of daily living with assistance level (OT) Flowsheets (Taken 02/12/2021 1337) LTG: PT will perform sit to stand in prep for activites of daily living with assistance level: Supervision/Verbal cueing   Problem: RH Eating Goal: LTG Patient will perform eating w/assist, cues/equip (OT) Description: LTG: Patient will perform eating with assist, with/without cues using equipment (OT) Flowsheets (Taken 02/12/2021 1337) LTG: Pt will perform eating with assistance level of: Supervision/Verbal cueing   Problem: RH Grooming Goal: LTG Patient will perform grooming w/assist,cues/equip (OT) Description: LTG: Patient will perform grooming with assist, with/without cues using equipment (OT) Flowsheets (Taken 02/12/2021 1337) LTG: Pt will perform grooming with assistance level of: Supervision/Verbal cueing   Problem: RH Bathing Goal: LTG Patient will bathe all body parts with assist levels (OT) Description: LTG: Patient will bathe all body parts with assist levels (OT) Flowsheets (Taken 02/12/2021 1337) LTG: Pt will perform bathing with assistance level/cueing:  Supervision/Verbal cueing   Problem: RH Dressing Goal: LTG Patient will perform upper body dressing (OT) Description: LTG Patient will perform upper body dressing with assist, with/without cues (OT). Flowsheets (Taken 02/12/2021 1337) LTG: Pt will perform upper body dressing with assistance level of: Supervision/Verbal cueing Goal: LTG Patient will perform lower body dressing w/assist (OT) Description: LTG: Patient will perform lower body dressing with assist, with/without cues in positioning using equipment (OT) Flowsheets (Taken 02/12/2021 1337) LTG: Pt will perform lower body dressing with assistance level of: Supervision/Verbal cueing   Problem: RH Toileting Goal: LTG Patient will perform toileting task (3/3 steps) with assistance level (OT) Description: LTG: Patient will perform toileting task (3/3 steps) with assistance level (OT)  Flowsheets (Taken 02/12/2021 1337) LTG: Pt will perform toileting task (3/3 steps) with assistance level: Supervision/Verbal cueing   Problem: RH Toilet Transfers Goal: LTG Patient will perform toilet transfers w/assist (OT) Description: LTG: Patient will perform toilet transfers with assist, with/without cues using equipment (OT) Flowsheets (Taken 02/12/2021 1337) LTG: Pt will perform toilet transfers with assistance level of: Supervision/Verbal cueing   Problem: RH Tub/Shower Transfers Goal: LTG Patient will perform tub/shower transfers w/assist (OT) Description: LTG: Patient will perform tub/shower transfers with assist, with/without cues using equipment (OT) Flowsheets (Taken 02/12/2021 1337) LTG: Pt will perform tub/shower stall transfers with assistance level of: Supervision/Verbal cueing   Problem: RH Attention Goal: LTG Patient will demonstrate this level of attention during functional activites (OT) Description: LTG:  Patient will demonstrate this level of attention during functional activites  (OT) Flowsheets (Taken 02/12/2021  1337) Patient will demonstrate above attention level in the following environment: Home LTG: Patient will demonstrate this level of attention during functional activites (OT): Minimal Assistance - Patient > 75%   Problem: RH Awareness Goal: LTG: Patient will demonstrate awareness during functional activites type of (OT) Description: LTG: Patient  will demonstrate awareness during functional activites type of (OT) Flowsheets (Taken 02/12/2021 1337) LTG: Patient will demonstrate awareness during functional activites type of (OT): Minimal Assistance - Patient > 75%

## 2021-02-12 NOTE — Progress Notes (Signed)
Inpatient Rehabilitation  Patient information reviewed and entered into eRehab system by Jerol Rufener M. Halston Fairclough, M.A., CCC/SLP, PPS Coordinator.  Information including medical coding, functional ability and quality indicators will be reviewed and updated through discharge.    

## 2021-02-13 NOTE — Progress Notes (Signed)
Speech Language Pathology TBI Note  Patient Details  Name: Brandon Burke MRN: [id number] Date of Birth: 1931/10/15  Today's Date: 02/13/2021 SLP Individual Time: 1320-1405 SLP Individual Time Calculation (min): 45 min  Short Term Goals: Week 1: SLP Short Term Goal 1 (Week 1): Patient will utilize external memory aids for orientation X 4 with Min verbal and visual cues. SLP Short Term Goal 2 (Week 1): Patient will demonstrate functional problem solving for basic and familiar tasks with Min verbal cues. SLP Short Term Goal 3 (Week 1): Patient will demosntrate sustained attention to functional tasks for 15 minutes with Min verbal cues for redirection.  Skilled Therapeutic Interventions: Skilled treatment session focused on cognitive goals. Upon arrival, patient was up in the bathroom with NT present. Patient was continent of urine. SLP facilitated session by providing visual aids for orientation that patient was able to independently utilize. SLP also facilitated session by providing Mod-Max A verbal cues for functional problem solving during a basic money management task. Patient's function was impacted by poor working memory with problem solving improving with visual aids and Mod verbal cues for organization. Patient returned to room and agreeable to sit up in the wheelchair but then requested to get back into bed ~2 minutes later. Mod verbal cues needed for hand placement during transfer. Patient left supine in bed with alarm on and all needs within reach. Continue with current plan of care.      Pain No/Denies Pain   Agitated Behavior Scale: TBI Observation Details Observation Environment: Patient's room Start of observation period - Date: 02/13/21 Start of observation period - Time: 1320 End of observation period - Date: 02/13/21 End of observation period - Time: 1405 Agitated Behavior Scale (DO NOT LEAVE BLANKS) Short attention span, easy distractibility, inability to  concentrate: Present to a slight degree Impulsive, impatient, low tolerance for pain or frustration: Absent Uncooperative, resistant to care, demanding: Absent Violent and/or threatening violence toward people or property: Absent Explosive and/or unpredictable anger: Absent Rocking, rubbing, moaning, or other self-stimulating behavior: Absent Pulling at tubes, restraints, etc.: Absent Wandering from treatment areas: Absent Restlessness, pacing, excessive movement: Absent Repetitive behaviors, motor, and/or verbal: Present to a slight degree Rapid, loud, or excessive talking: Absent Sudden changes of mood: Absent Easily initiated or excessive crying and/or laughter: Absent Self-abusiveness, physical and/or verbal: Absent Agitated behavior scale total score: 16  Therapy/Group: Individual Therapy  Marenda Accardi 02/13/2021, 2:38 PM

## 2021-02-13 NOTE — Care Management (Signed)
Inpatient [place] Individual Statement of Services  Patient Name:  Brandon Burke [facility]  Date:  02/13/2021  Welcome to the [place].  Our goal is to provide you with an individualized program based on your diagnosis and situation, designed to meet your specific needs.  With this comprehensive rehabilitation program, you will be expected to participate in at least 3 hours of rehabilitation therapies Monday-Friday, with modified therapy programming on the weekends.  Your rehabilitation program will include the following services:  Physical Therapy (PT), Occupational Therapy (OT), Speech Therapy (ST), 24 hour per day rehabilitation nursing, Therapeutic Recreaction (TR), Psychology, Neuropsychology, Care Coordinator, Rehabilitation Medicine, [place], and Other  Weekly team conferences will be held on Tuesdays to discuss your progress.  Your Inpatient Rehabilitation Care Coordinator will talk with you frequently to get your input and to update you on team discussions.  Team conferences with you and your family in attendance may also be held.  Expected length of stay: 18-21 days    Overall anticipated outcome: Supervision  Depending on your progress and recovery, your program may change. Your Inpatient Rehabilitation Care Coordinator will coordinate services and will keep you informed of any changes. Your Inpatient Rehabilitation Care Coordinator's name and contact numbers are listed  below.  The following services may also be recommended but are not provided by the [place] will be made to provide these services after discharge if needed.  Arrangements include referral to agencies that provide these services.  Your insurance has been verified to be:  [employer]  Your primary doctor is:  [name]  Pertinent information will be shared with your doctor and your insurance company.  Inpatient Rehabilitation Care Coordinator:  Cathleen Corti [phone] or [phone]  Information discussed with and copy given to patient by: Brandon Burke, 02/13/2021, 9:30 AM

## 2021-02-13 NOTE — Plan of Care (Signed)
Behavioral Plan   Rancho Level: VI-emerging VII  Behavior to decrease/ eliminate:   -Getting out of bed without assistance -Intermittent agitation/anxiety/confusion  -Regulating Sleep/Wake Cycle   Changes to environment:  -Blinds open and lights on during day -Encourage sitting up in chair or recliner as much as possible - TV off while sleeping   Interventions:  -Telesitter -Sleep/Wake Chart -Bed Alarm -Seatbelt Alarm  Recommendations for interactions with patient:  -Ask if patient needs to use the bathroom with each interaction  -Offer washcloth for patient to clean his face to help with arousal    Attendees:    Weston Anna, SLP Cherylynn Ridges, OT

## 2021-02-13 NOTE — IPOC Note (Signed)
Overall Plan of Care [facility]) Patient Details Name: SUSAN ARANA MRN: [id number] DOB: December 20, 1931  Admitting Diagnosis: TBI (traumatic brain injury)  Hospital Problems: Principal Problem:   TBI (traumatic brain injury)     Functional Problem List: Nursing Bowel, Endurance, Edema, Medication Management, Pain, Safety, Sensory  PT Balance, Safety, Behavior, Skin Integrity, Endurance, Motor, Pain, Perception  OT Balance, Behavior, Cognition, Edema, Endurance, Motor, Nutrition, Pain, Perception, Safety, Sensory, Skin Integrity, Vision  SLP Behavior, Cognition  TR         Basic ADL's: OT Grooming, Eating, Bathing, Dressing, Toileting     Advanced  ADL's: OT       Transfers: PT Bed Mobility, Bed to Chair, Car, Manufacturing systems engineer, Metallurgist: PT Ambulation, Emergency planning/management officer, Stairs     Additional Impairments: OT None  SLP Social Cognition   Social Interaction, Problem Solving, Memory, Attention, Awareness  TR      Anticipated Outcomes Item Anticipated Outcome  Self Feeding Supervision  Set designer Transfers Supervision  Bowel/Bladder  supervision  Transfers  supervision  Locomotion  supervision  Communication     Cognition     Pain  <2  Safety/Judgment  supervision and no falls   Therapy Plan: PT Intensity: Minimum of 1-2 x/day ,45 to 90 minutes PT Frequency: 5 out of 7 days PT Duration Estimated Length of Stay: 18-21 days OT Intensity: Minimum of 1-2 x/day, 45 to 90 minutes OT Frequency: 5 out of 7 days OT Duration/Estimated Length of Stay: 3 weeks SLP Intensity: Minumum of 1-2 x/day, 30 to 90 minutes SLP Frequency: 3 to 5 out of 7 days SLP Duration/Estimated Length of Stay: 3 weeks   Due to the current state of emergency, patients may not be receiving their 3-hours of Medicare-mandated therapy.   Team Interventions: Nursing Interventions Patient/Family  Education, Bowel Management, Pain Management, Medication Management, Discharge Planning  PT interventions Ambulation/gait training, Community reintegration, DME/adaptive equipment instruction, Neuromuscular re-education, Psychosocial support, Stair training, Training and development officer, Discharge planning, Pain management, Therapeutic Activities, Cognitive remediation/compensation, Functional mobility training, Patient/family education, Therapeutic Exercise, Visual/perceptual remediation/compensation, UE/LE Coordination activities, UE/LE Strength taining/ROM, Wheelchair propulsion/positioning  OT Interventions Training and development officer, Cognitive remediation/compensation, Community reintegration, Discharge planning, Disease mangement/prevention, Engineer, drilling, Functional mobility training, Pain management, Neuromuscular re-education, Patient/family education, Psychosocial support, Self Care/advanced ADL retraining, Skin care/wound managment, Therapeutic Activities, Therapeutic Exercise, UE/LE Strength taining/ROM, UE/LE Coordination activities, Visual/perceptual remediation/compensation, Wheelchair propulsion/positioning  SLP Interventions Cognitive remediation/compensation, Internal/external aids, Therapeutic Activities, Environmental controls, Cueing hierarchy, Functional tasks, Patient/family education  TR Interventions    SW/CM Interventions Discharge Planning, Psychosocial Support, Patient/Family Education   Barriers to Discharge MD  Medical stability  Nursing Decreased caregiver support, Home environment access/layout, Incontinence, Lack of/limited family support, Weight bearing restrictions, Medication compliance Lives in [place] with spouse. Level entry. Wife is expected caregiver at discharge. Incontinent bowel. Falls.  PT Decreased caregiver support, Other (comments) cognition  OT Decreased caregiver support Wife is also elderly and can't provide physical assist. [place] of  frequent falls  SLP      SW       Team Discharge Planning: Destination: PT-Home ,OT- Assisted Living , SLP-Home Projected Follow-up: PT-Home health PT, OT-  Home health OT, 24 hour supervision/assistance, SLP-24 hour supervision/assistance, Home Health SLP, Outpatient SLP Projected Equipment Needs: PT-To be determined, OT- To be determined, SLP-None recommended by SLP Equipment Details: PT- , OT-  Patient/family involved in  discharge planning: PT- Patient, Family member/caregiver,  OT-Patient unable/family or caregiver not available, SLP-Patient, Family member/caregiver  MD ELOS: 17-20 days Medical Rehab Prognosis:  Excellent Assessment: The patient has been admitted for CIR therapies with the diagnosis of TBI/polytrauma. The team will be addressing functional mobility, strength, stamina, balance, safety, adaptive techniques and equipment, self-care, bowel and bladder mgt, patient and caregiver education, NMR, behavior, cognition, communication. Goals have been set at supervision for mobility, self-care and cognition /communication. Pt does have significant baseline visual deficits.   Due to the current state of emergency, patients may not be receiving their 3 hours per day of Medicare-mandated therapy.    Meredith Staggers, MD, FAAPMR     See Team Conference Notes for weekly updates to the plan of care

## 2021-02-13 NOTE — Progress Notes (Signed)
PROGRESS NOTE   Subjective/Complaints: Pt up in w/c this morning. Says he feels pretty good. Only mild headache and general "soreness."  Had a much better night per RN.  ROS: Patient denies fever, rash, sore throat, blurred vision, nausea, vomiting, diarrhea, cough, shortness of breath or chest pain,   or mood change.     Objective:   No results found. Recent Labs    02/12/21 0508  WBC 6.4  HGB 13.4  HCT 38.6*  PLT 229   Recent Labs    02/12/21 0508  NA 139  K 3.8  CL 107  CO2 23  GLUCOSE 112*  BUN 15  CREATININE 1.11  CALCIUM 9.1    Intake/Output Summary (Last 24 hours) at 02/13/2021 1014 Last data filed at 02/13/2021 0730 Gross per 24 hour  Intake 600 ml  Output --  Net 600 ml        Physical Exam: Vital Signs Blood pressure 129/60, pulse 69, temperature 99.1 F (37.3 C), resp. rate 19, height 6' 0.01" (1.829 m), weight 77.6 kg, SpO2 96 %.  Constitutional: No distress . Vital signs reviewed. HEENT: NCAT, EOMI, oral membranes moist Neck: supple Cardiovascular: RRR without murmur. No JVD    Respiratory/Chest: CTA Bilaterally without wheezes or rales. Normal effort    GI/Abdomen: BS +, non-tender, non-distended Ext: no clubbing, cyanosis, or edema Psych: pleasant and cooperative  Skin: multiple bruises and lacs on face, trunk, limbs.  Neuro:  Alert and oriented to person, [facility] but said he was in [place]. Told me his home was in [place] and then corrected current place to [place]. Tells me he doesn't remember what happened to him. Did provide basic biographical information. Able to count fingers with right eye at 2 and 4 feet. Can see colors and objects from distance OD. No vision OS. Moves all 4's, 4 to 4+/5. No focal sensory findings.  Musculoskeletal: left rib cage sore with deep breathing and palpation    Assessment/Plan: 1. Functional deficits which require 3+ hours per day of interdisciplinary therapy  in a comprehensive inpatient rehab setting. Physiatrist is providing close team supervision and 24 hour management of active medical problems listed below. Physiatrist and rehab team continue to assess barriers to discharge/monitor patient progress toward functional and medical goals  Care Tool:  Bathing    Body parts bathed by patient: Face   Body parts bathed by helper: Right arm, Left arm, Chest, Front perineal area, Abdomen, Buttocks, Left upper leg, Right upper leg, Right lower leg, Left lower leg     Bathing assist Assist Level: Maximal Assistance - Patient 24 - 49%     Upper Body Dressing/Undressing Upper body dressing   What is the patient wearing?: Pull over shirt    Upper body assist Assist Level: Maximal Assistance - Patient 25 - 49%    Lower Body Dressing/Undressing Lower body dressing      What is the patient wearing?: Pants     Lower body assist Assist for lower body dressing: Total Assistance - Patient < 25%     Toileting Toileting Toileting Activity did not occur (Clothing management and hygiene only): N/A (no void or bm)  Toileting assist Assist  for toileting: Minimal Assistance - Patient > 75%     Transfers Chair/bed transfer  Transfers assist  Chair/bed transfer activity did not occur: Safety/medical concerns (Pt unable to maintian alertness)  Chair/bed transfer assist level: Minimal Assistance - Patient > 75%     Locomotion Ambulation   Ambulation assist      Assist level: 2 helpers Assistive device: Hand held assist Max distance: 100   Walk 10 feet activity   Assist     Assist level: Minimal Assistance - Patient > 75% Assistive device: Hand held assist   Walk 50 feet activity   Assist    Assist level: 2 helpers Assistive device: Hand held assist    Walk 150 feet activity   Assist Walk 150 feet activity did not occur: Safety/medical concerns         Walk 10 feet on uneven surface  activity   Assist      Assist level: Minimal Assistance - Patient > 75% Assistive device: Hand held assist, Other (comment) (and use of handrail)   Wheelchair     Assist Is the patient using a wheelchair?: Yes Type of Wheelchair: Manual    Wheelchair assist level: Total Assistance - Patient < 25% Max wheelchair distance: 150    Wheelchair 50 feet with 2 turns activity    Assist        Assist Level: Total Assistance - Patient < 25%   Wheelchair 150 feet activity     Assist      Assist Level: Total Assistance - Patient < 25%   Blood pressure 129/60, pulse 69, temperature 99.1 F (37.3 C), resp. rate 19, height 6' 0.01" (1.829 m), weight 77.6 kg, SpO2 96 %.  Medical Problem List and Plan: 1.  TBI/concussion secondary to fall 02/03/2021.  Cranial CT scan negative.             -patient may shower             -ELOS/Goals: 9-15 days/supervision             -Continue CIR therapies including PT, OT, and SLP  2.  Antithrombotics: -DVT/anticoagulation:  Pharmaceutical: Other (comment)/Eliquis             -antiplatelet therapy: N/A 3. Pain Management: Lidoderm patch, Robaxin as needed, Ultram as needed             Monitor with increased exertion 4. Mood/sleep/mental status:    10/14 MUCH more alert and appropriate today             -antipsychotic agents: N/A  -sleep chart  -continue scheduled seroquel at 2100  -continue ritalin to help with arousal, attention and focus during the day  -Staph Epi UTI: (+UCX 10/8)   -pan sensitive---doxy for 5 days beginning 10/13 5. Neuropsych: This patient is not yet capable of making decisions on his own behalf. 6. Skin/Wound Care: Left facial laceration or abrasion.  Sutures removed.  Routine skin checks 7. Fluids/Electrolytes/Nutrition: Routine in and outs             encourage PO  -added protein supp for low albumin 8.  Multiple left rib fractures with extrapleural hematoma.  Conservative management 9.  Density right middle lung lobe.   Incidental on CT.  Follow-up outpatient 10.  History of DVT/pulmonary emboli.  Eliquis 11.  Essential hypertension: Tenormin 50 mg daily 12.  BPH.  Flomax 0.4 mg daily.  voiding and continent x 24 hours 13.  Hyperlipidemia.  Pravachol 14.  Hypothyroidism.  Synthroid 15.  Drug-induced constipation.  MiraLAX daily, Colace 100 mg twice daily             Adjust bowel meds as necessary   -had bm 10/12    LOS: 2 days A FACE TO [place] 02/13/2021, 10:14 AM

## 2021-02-13 NOTE — Progress Notes (Addendum)
Inpatient Rehabilitation Care Coordinator Assessment and Plan Patient Details  Name: Brandon Burke MRN: [id number] Date of Birth: 1931-10-11  Today's Date: 02/13/2021  Hospital Problems: Principal Problem:   TBI (traumatic brain injury)  Past Medical History:  Past Medical History:  Diagnosis Date   Constipation    DVT (deep venous thrombosis) ([place])    hx of 2001 (after prostate surgery)     Glaucoma    Hyperlipidemia    Hypertension    Melanoma ([place])    face, hand   Mitral valve prolapse    OSA (obstructive sleep apnea) 11/26/2020   PE (pulmonary embolism)    5-09:was referred to hematology, and they recommend to discontinue Coumadin 5/11   Prostate cancer ([place])     released from urology in 2010,needs yearly PSAs with PCP   Seasonal allergies    Past Surgical History:  Past Surgical History:  Procedure Laterality Date   CATARACT EXTRACTION Bilateral    COLONOSCOPY     several   EYE SURGERY Bilateral    Cat Sx   PARS PLANA VITRECTOMY Left 09/06/2019   Procedure: PARS PLANA VITRECTOMY WITH 25 GAUGE WITH INTRAVITREAL ANTIBIOTICS;  Surgeon: [name], MD;  Location: [place];  Service: Ophthalmology;  Laterality: Left;   PHOTOCOAGULATION WITH LASER Left 09/06/2019   Procedure: Photocoagulation With Laser;  Surgeon: [name], MD;  Location: [place];  Service: Ophthalmology;  Laterality: Left;   PROSTATECTOMY  2000   RUPTURED GLOBE EXPLORATION AND REPAIR Left 08/16/2019   Procedure: OPEN GLOBE EXPLORATION EYE POSSIBLE ANTERIOR  CHAMBER A[place] OUT;  Surgeon: Lonia Skinner, MD;  Location: [place];  Service: Ophthalmology;  Laterality: Left;   Social History:  reports that he has never smoked. He has never used smokeless tobacco. He reports that he does not drink alcohol and does not use drugs.  Family / Support Systems Marital Status: Married How Long?: 60 years Patient Roles: Spouse Spouse/Significant Other: [name] (wife): [phone] Children: No children Other  Supports: Karna Christmas (brother) Anticipated Caregiver: Wife Ability/Limitations of Caregiver: Wife does not drive; Brother is only available once a week to assist with errands (appointments, grocery shopping, etc). Caregiver Availability: 24/7 Family Dynamics: Pt lives with his wife in independent living.  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 school [employer] - How often do you need to have someone help you when you read instructions, pamphlets, or other written material from your doctor or pharmacy?: Sometimes Writes: Yes Employment Status: Retired Date Retired/Disabled/Unemployed: 1991/1992 Age Retired: 31 Public relations account executive Issues: Denies Guardian/Conservator: N/A   Abuse/Neglect Abuse/Neglect Assessment Can Be Completed: Yes Physical Abuse: Denies Verbal Abuse: Denies Sexual Abuse: Denies Exploitation of patient/patient's resources: Denies Self-Neglect: Denies  Patient response to: Social Isolation - How often do you feel lonely or isolated from those around you?: Never  Emotional Status Pt's affect, behavior and adjustment status: Pt in good spirits at time of visit Recent Psychosocial Issues: Denies Psychiatric History: Denies Substance Abuse History: Denies  Patient / Family Perceptions, Expectations & Goals Pt/Family understanding of illness & functional limitations: Pt and family have a general understanding of pt care needs Premorbid pt/family roles/activities: Independent with some assistance needed for cognition Anticipated changes in roles/activities/participation: Assistance with ADLs/IADls Pt/family expectations/goals: Pt goal is to "work on where I don't forget stuff." Wife's goal-" for him to return how he was before this. Brother's goal- "get well enough to get how he was taking  care of himself."  [employer]:  None Premorbid Home Care/DME Agencies: None Transportation available at discharge: Brother Terri Is the patient able to respond to transportation needs?: Yes In the past 12 months, has lack of transportation kept you from medical appointments or from getting medications?: No In the past 12 months, has lack of transportation kept you from meetings, work, or from getting things needed for daily living?: No Resource referrals recommended: Neuropsychology  Discharge Planning Living Arrangements: Spouse/significant other Support Systems: Spouse/significant other, Other relatives Type of Residence: Independent Living, Other (Comment) ([facility]) Insurance Resources: Multimedia programmer (specify) Tax inspector) Financial Resources: Radio broadcast assistant Screen Referred: No Living Expenses: Other (Comment) ([facility]) Money Management: Spouse, Other (Comment) (Pt reports most bills are auto drafted from account) Does the patient have any problems obtaining your medications?: No Home Management: Pt and wife do not have to pay for meals as meals are made on site, and they can go to restaurants to on site to pick up meals. Housekeeping comes in 2xs a month which is paid for by facility. Patient/Family Preliminary Plans: No changes Care Coordinator Barriers to Discharge: Decreased caregiver support, Lack of/limited family support Care Coordinator Anticipated Follow Up Needs: HH/OP Expected length of stay: 18-21 days  Clinical Impression SW met with pt, pt wife and pt brother [name] in room to complete assessment.Pt HCPOA is his wife [name]. is a Ecologist veteran: about [phone]. Pt also [employer] reserve. Pt does not use any VA benefits. DME: RW and crutches. Home is handicap accessible.  *SW left message for [employer]. Of RN with [facility] [phone]) to confirm there is not contracted HHA for therapies in their community. SW waiting on  follow-up.  *SW returned phone call to Joanne Chars [phone]) who reported their in-house therapy team is [facility]  [phone]) who also work with independent living residents. SW left message for [facility] department to discuss process to establish services if needed.   Shaunette Gassner A Ladislaus Repsher 02/13/2021, 1:25 PM

## 2021-02-13 NOTE — Progress Notes (Signed)
Occupational Therapy TBI Note  Patient Details  Name: Brandon Burke MRN: [id number] Date of Birth: 12/27/1931  Today's Date: 02/13/2021 Session 1 OT Individual Time: 8088-1103 OT Individual Time Calculation (min): 42 min   Session 2 OT Individual Time: 1594-5859 OT Individual Time Calculation (min): 57 min    Short Term Goals: Week 1:  OT Short Term Goal 1 (Week 1): Patient will complete UB dressing with Min A OT Short Term Goal 2 (Week 1): Patient will ambulate to the bathroom with min A OT Short Term Goal 3 (Week 1): Patient will sequence 2 steps of grooming task with min questioning cues  Skilled Therapeutic Interventions/Progress Updates:    Pt greeted semi-reclined in bed and agreeable to OT treatment session. Pt much more alert and oriented x3 today. Pt needed mod A to come to sitting EOB, then performed sit<>stand w/ RW and min A. Min A to ambulate into bathroom with cues for RW positioning over commode to stand and urinate. Pt then showered sit<>stand from shower seat with overall min A and verbal cues for thoroughness. Pt following commands well this morning, but perseverating on his working years and where all he worked. 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 Dressing from wc with mod A for UB due to painful rib fx, and max A LB. Pt internally distracted throughout session, but able to be redirected. Toothbrushing task seated in wc with cues for initiation and sequencing. Pt left seated in wc with alarm belt on, call bell in reach, and needs met.   Session 2 Pt greeted seated in wc and agreeable to OT treatment session. Pt brought to therapy gym and tried to get pt to name colored pegs. Pt with difficulty noting difference in colors, even with contrasted background. BITS activity with trunk control to lean forward and push large buttons in high contrast environment. Encouraged finger isolation and  visual scanning across all 4 quadrants. Pt able to read up to 4 words and able to recall up to 3 with moderate cues. Functional ambulation 300 feet , 150 feet w/ RW. Focus on step length and forward head during ambulation. Pt returned to room and needed min A to lift Les back in bed. Pt left semi-reclined in bed with bed alarm on, call bell in reach, and needs met.   Therapy Documentation Precautions:  Precautions Precautions: Fall Restrictions Weight Bearing Restrictions: No Pain: Denies pain Agitated Behavior Scale: TBI Observation Details Observation Environment: gym/room Start of observation period - Date: 02/13/21 Start of observation period - Time: 0800 End of observation period - Date: 02/13/21 End of observation period - Time: 0900 Agitated Behavior Scale (DO NOT LEAVE BLANKS) Short attention span, easy distractibility, inability to concentrate: Present to a moderate degree Impulsive, impatient, low tolerance for pain or frustration: Absent Uncooperative, resistant to care, demanding: Absent Violent and/or threatening violence toward people or property: Absent Explosive and/or unpredictable anger: Absent Rocking, rubbing, moaning, or other self-stimulating behavior: Absent Pulling at tubes, restraints, etc.: Absent Wandering from treatment areas: Absent Restlessness, pacing, excessive movement: Absent Repetitive behaviors, motor, and/or verbal: Present to a slight degree Rapid, loud, or excessive talking: Absent Sudden changes of mood: Present to a slight degree Easily initiated or excessive crying and/or laughter: Absent Self-abusiveness, physical and/or verbal: Absent Agitated behavior scale total score: 18   Therapy/Group: Individual Therapy  Valma Cava 02/13/2021, 1:01 PM

## 2021-02-13 NOTE — Progress Notes (Signed)
Patient ID: Brandon Burke, male   DOB: 10-27-1931, 87 y.o.   MRN: [id number]  SW made efforts to meet with pt but pt in therapy. SW met with pt wife and pt brother Brandon Burke in room to introduce self, explain role, and discuss discharge process. Pt wife reported that she does not drive, and her husband was driving them to run errands prior to his recent fall. Pt brother Brandon Burke reported he is available to assist with transportation to bring her to the hospital. SW will f/u to complete assessment.  *SW completed assessment with pt. See chart.   Loralee Pacas, MSW, [place] Office: [phone] Cell: [phone] Fax: [phone]

## 2021-02-13 NOTE — Plan of Care (Signed)
  Problem: Consults Goal: RH BRAIN INJURY PATIENT EDUCATION Description: Description: See Patient Education module for eduction specifics Outcome: Progressing   Problem: RH BOWEL ELIMINATION Goal: RH STG MANAGE BOWEL WITH ASSISTANCE Description: STG Manage Bowel with Supervision Assistance. Outcome: Progressing Goal: RH STG MANAGE BOWEL W/MEDICATION W/ASSISTANCE Description: STG Manage Bowel with Medication with Supervision Assistance. Outcome: Progressing   Problem: RH SKIN INTEGRITY Goal: RH STG MAINTAIN SKIN INTEGRITY WITH ASSISTANCE Description: STG Maintain Skin Integrity With Supervision Assistance. Outcome: Progressing Goal: RH STG ABLE TO PERFORM INCISION/WOUND CARE W/ASSISTANCE Description: STG Able To Perform Incision/Wound Care With Supervision Assistance. Outcome: Progressing   Problem: RH SAFETY Goal: RH STG ADHERE TO SAFETY PRECAUTIONS W/ASSISTANCE/DEVICE Description: STG Adhere to Safety Precautions With Supervision Assistance/Device. Outcome: Progressing Goal: RH STG DECREASED RISK OF FALL WITH ASSISTANCE Description: STG Decreased Risk of Fall With Supervision Assistance. Outcome: Progressing   Problem: RH COGNITION-NURSING Goal: RH STG USES MEMORY AIDS/STRATEGIES W/ASSIST TO PROBLEM SOLVE Description: STG Uses Memory Aids/Strategies With Supervision Assistance to Problem Solve. Outcome: Progressing Goal: RH STG ANTICIPATES NEEDS/CALLS FOR ASSIST W/ASSIST/CUES Description: STG Anticipates Needs/Calls for Assist With Supervision Assistance/Cues. Outcome: Progressing   Problem: RH PAIN MANAGEMENT Goal: RH STG PAIN MANAGED AT OR BELOW PT'S PAIN GOAL Description: < 2 on a 0-10 pain scale. Outcome: Progressing   Problem: RH KNOWLEDGE DEFICIT BRAIN INJURY Goal: RH STG INCREASE KNOWLEDGE OF SELF CARE AFTER BRAIN INJURY Description: Patient will demonstrate knowledge of medication management, pain management, bowel management with educational materials and  handouts provided by staff independently at discharge. Outcome: Progressing

## 2021-02-13 NOTE — Progress Notes (Signed)
Physical Therapy TBI Note  Patient Details  Name: Brandon Burke MRN: [id number] Date of Birth: [birth year]  Today's Date: 02/13/2021 PT Individual Time: 0830-0928 PT Individual Time Calculation (min): 58 min   Short Term Goals: Week 1:  PT Short Term Goal 1 (Week 1): Pt will perform supine to/from sit w/min assist PT Short Term Goal 2 (Week 1): Pt will perform basic transfers w/cga and minimal cueing for safety PT Short Term Goal 3 (Week 1): Pt will ambulate 117ft w/cga to min assist in environment w/minimal to no distractions and no AD PT Short Term Goal 4 (Week 1): Pt will ambulat >142ft w/LRAD in busy environment including turns w/cga  Skilled Therapeutic Interventions/Progress Updates:   Pt initially oob in wc.  Disoriented to place and date but easily reoriented.  Pt transported to gym. Reports rib pain 6/10, treatment to tolerance.  Pt stand pivot transfer wc to mat w/min assist, max cues for sequencing, tactile cues for wt shifting, verbal cues for safety.  BERG completed as pt able.  See below.Patient demonstrates increased fall risk as noted by score of  14 /56 on Berg Balance Scale.  (<36= high risk for falls, close to 100%; 37-45 significant >80%; 46-51 moderate >50%; 52-55 lower >25%)  TUG 63min and 32 sec w/RW, cga, max cues to attend to task.    Sit to stand to RW and gait x 75 ft w/min assist, extremetly slow cadence, frequent cues to attend to task, shuffling gait.  At end of session, Pt left oob in wc w/alarm belt set and needs in reach.  Nursing notified of pt need for pain meds for improved tolerance of next session (OT).    In sitting, pt dozing off to sleep during rest break/recovery.   Pt level of function varies greatly from session to session and day to day.  He was limited today by pain and fell asleep several times during rest breaks.  He did not recall this therapist or treatment area from yesterday and required reorientation.      Therapy  Documentation Precautions:  Precautions Precautions: Fall Restrictions Weight Bearing Restrictions: No Agitated Behavior Scale: TBI Observation Details Observation Environment: Patient's room Start of observation period - Date: 02/13/21 Start of observation period - Time: 1320 End of observation period - Date: 02/13/21 End of observation period - Time: 1405 Agitated Behavior Scale (DO NOT LEAVE BLANKS) Short attention span, easy distractibility, inability to concentrate: Present to a slight degree Impulsive, impatient, low tolerance for pain or frustration: Absent Uncooperative, resistant to care, demanding: Absent Violent and/or threatening violence toward people or property: Absent Explosive and/or unpredictable anger: Absent Rocking, rubbing, moaning, or other self-stimulating behavior: Absent Pulling at tubes, restraints, etc.: Absent Wandering from treatment areas: Absent Restlessness, pacing, excessive movement: Absent Repetitive behaviors, motor, and/or verbal: Present to a slight degree Rapid, loud, or excessive talking: Absent Sudden changes of mood: Absent Easily initiated or excessive crying and/or laughter: Absent Self-abusiveness, physical and/or verbal: Absent Agitated behavior scale total score: 16     Therapy/Group: Individual Therapy Callie Fielding, [place] 02/13/2021, 2:48 PM

## 2021-02-14 DIAGNOSIS — R7401 Elevation of levels of liver transaminase levels: Secondary | ICD-10-CM

## 2021-02-14 DIAGNOSIS — Z86711 Personal history of pulmonary embolism: Secondary | ICD-10-CM

## 2021-02-14 DIAGNOSIS — I10 Essential (primary) hypertension: Secondary | ICD-10-CM

## 2021-02-14 DIAGNOSIS — S069X1D Unspecified intracranial injury with loss of consciousness of 30 minutes or less, subsequent encounter: Secondary | ICD-10-CM

## 2021-02-14 NOTE — Progress Notes (Signed)
Occupational Therapy Session Note  Patient Details  Name: Brandon Burke MRN: [id number] Date of Birth: [birth year]  Today's Date: 02/15/2021 OT Individual Time: 1100-1157 and 4944-9675 OT Individual Time Calculation (min): 57 min and 43 min   Short Term Goals: Week 1:  OT Short Term Goal 1 (Week 1): Patient will complete UB dressing with Min A OT Short Term Goal 2 (Week 1): Patient will ambulate to the bathroom with min A OT Short Term Goal 3 (Week 1): Patient will sequence 2 steps of grooming task with min questioning cues  Skilled Therapeutic Interventions/Progress Updates:    Pt greeted in bed and agreeable to engage in bathing/dressing tasks during session. Supervision for supine<sit today! CGA for ambulatory transfer to shower chair with vcs and RW. He then bathed at sit<stand level with mod cuing for sequencing. Dressing completed sit<stand from chair in the bathroom. Pt required assistance for gripper socks. He then reported that he "pooped" after pants were pulled up given Min A. Pt lowered pants + brief and pt actually did not poop. Per pt, "I didn't? Oh! Well, don't believe anything I say." Increased time provided during bathing due to pain and fatigue. He used the RW to ambulate with CGA-Min A to the recliner chair after. Assisted pt with repositioning for comfort and vcs for combing his hair. He remained sitting up at close of session with all needs within reach and safety belt fastened, wife at bedside. Tx focus placed on functional transfers, activity tolerance, dynamic balance, ADL retraining, and functional cognition. Pt still exhibits trouble with motor planning during functional tasks + transfers and requires cuing.  2nd Session 1:1 tx (43 min) Pt greeted in the recliner, asleep, easily woken. Pt requesting seated level therapy this afternoon due to fatigue/pain. Pt premedicated for Lt sided rib pain, exercises modified on the Lt side and rest breaks provided to address. Pt  was guided through UB stretches and then B UE therapeutic exercise using green tband/3# bar x10-12 reps 2 sets. Pt required max multimodal cuing including hand over hand to carryover proper stretch/exercise technique due to cognitive deficits. He remained sitting up in the recliner at close of session, all needs within reach and safety belt fastened.   Therapy Documentation Precautions:  Precautions Precautions: Fall Restrictions Weight Bearing Restrictions: No  Pain: in ribs from coughing, RN made aware and provided pt with pain medicine during 1st session Pain Assessment Pain Scale: 0-10 Pain Score: 0-No pain ADL: ADL Eating: Maximal assistance Grooming: Maximal assistance Upper Body Bathing: Maximal assistance Lower Body Bathing: Dependent Upper Body Dressing: Maximal assistance Lower Body Dressing: Dependent Toileting: Dependent Toilet Transfer: Unable to assess    Therapy/Group: Individual Therapy  Josey Dettmann A Pookela Sellin 02/15/2021, 12:42 PM

## 2021-02-14 NOTE — Plan of Care (Signed)
  Problem: Consults Goal: RH BRAIN INJURY PATIENT EDUCATION Description: Description: See Patient Education module for eduction specifics Outcome: Progressing   Problem: RH BOWEL ELIMINATION Goal: RH STG MANAGE BOWEL WITH ASSISTANCE Description: STG Manage Bowel with Supervision Assistance. Outcome: Progressing Goal: RH STG MANAGE BOWEL W/MEDICATION W/ASSISTANCE Description: STG Manage Bowel with Medication with Supervision Assistance. Outcome: Progressing   Problem: RH SKIN INTEGRITY Goal: RH STG MAINTAIN SKIN INTEGRITY WITH ASSISTANCE Description: STG Maintain Skin Integrity With Supervision Assistance. Outcome: Progressing Goal: RH STG ABLE TO PERFORM INCISION/WOUND CARE W/ASSISTANCE Description: STG Able To Perform Incision/Wound Care With Supervision Assistance. Outcome: Progressing   Problem: RH SAFETY Goal: RH STG ADHERE TO SAFETY PRECAUTIONS W/ASSISTANCE/DEVICE Description: STG Adhere to Safety Precautions With Supervision Assistance/Device. Outcome: Progressing Goal: RH STG DECREASED RISK OF FALL WITH ASSISTANCE Description: STG Decreased Risk of Fall With Supervision Assistance. Outcome: Progressing   Problem: RH COGNITION-NURSING Goal: RH STG USES MEMORY AIDS/STRATEGIES W/ASSIST TO PROBLEM SOLVE Description: STG Uses Memory Aids/Strategies With Supervision Assistance to Problem Solve. Outcome: Progressing Goal: RH STG ANTICIPATES NEEDS/CALLS FOR ASSIST W/ASSIST/CUES Description: STG Anticipates Needs/Calls for Assist With Supervision Assistance/Cues. Outcome: Progressing   Problem: RH PAIN MANAGEMENT Goal: RH STG PAIN MANAGED AT OR BELOW PT'S PAIN GOAL Description: < 2 on a 0-10 pain scale. Outcome: Progressing   Problem: RH KNOWLEDGE DEFICIT BRAIN INJURY Goal: RH STG INCREASE KNOWLEDGE OF SELF CARE AFTER BRAIN INJURY Description: Patient will demonstrate knowledge of medication management, pain management, bowel management with educational materials and  handouts provided by staff independently at discharge. Outcome: Progressing

## 2021-02-14 NOTE — Progress Notes (Signed)
Occupational Therapy Session Note  Patient Details  Name: Brandon Burke MRN: [id number] Date of Birth: Dec 15, 1931  Today's Date: 02/14/2021 OT Individual Time: 5732-2025 OT Individual Time Calculation (min): 53 min   Short Term Goals: Week 1:  OT Short Term Goal 1 (Week 1): Patient will complete UB dressing with Min A OT Short Term Goal 2 (Week 1): Patient will ambulate to the bathroom with min A OT Short Term Goal 3 (Week 1): Patient will sequence 2 steps of grooming task with min questioning cues  Skilled Therapeutic Interventions/Progress Updates:    Pt greeted in bed with no c/o pain. ADL needs presently met. His spouse Lorita Officer was present. Pt agreeable to go outdoors for tx. Mod A and vcs for supine<sit, pt with decreased motor planning abilities throughout session in regards to transfers and functional movements. Min A for sit<stand and for transfer to the w/c. Pt was then escorted via w/c to the outdoor patio. Worked on dynamic standing balance while pt ambulated over uneven concrete and up/down inclines given Min A and vcs to widen base of support. He transferred to 2 benches with cuing. Worked on cognitive remediation by having pt remember and find 3 items while ambulating. Note that it took a great deal of time to have pt repeat the 3 items back to therapist before looking for them. Pt ultimately required Max A and max vcs to locate these items in the environment. Noticed that pt exhibited difficultly with color recognition, ?visual deficit vs purely cognitive. Pt pleasantly confused throughout session. Wife frequently orienting him. He was then returned to the room via w/c and remained sitting up, wearing the safety belt with call bell within reach. Per wife, she will tell staff when she is leaving so that they can assist him back to bed for safety. Pt very impulsive with sit<stands and functional movement today, exhibits decreased insights into deficits, general awareness, and  safety.    Therapy Documentation Precautions:  Precautions Precautions: Fall Restrictions Weight Bearing Restrictions: No ADL: ADL Eating: Maximal assistance Grooming: Maximal assistance Upper Body Bathing: Maximal assistance Lower Body Bathing: Dependent Upper Body Dressing: Maximal assistance Lower Body Dressing: Dependent Toileting: Dependent Toilet Transfer: Unable to assess     Therapy/Group: Individual Therapy  Trase Bunda A Marshun Duva 02/14/2021, 3:57 PM

## 2021-02-14 NOTE — Progress Notes (Signed)
PROGRESS NOTE   Subjective/Complaints: Patient seen laying in bed this morning.  He states he slept well overnight, confirmed with sleep chart.  He denies complaints.  ROS: Denies CP, SOB, N/V/D  Objective:   No results found. Recent Labs    02/12/21 0508  WBC 6.4  HGB 13.4  HCT 38.6*  PLT 229    Recent Labs    02/12/21 0508  NA 139  K 3.8  CL 107  CO2 23  GLUCOSE 112*  BUN 15  CREATININE 1.11  CALCIUM 9.1     Intake/Output Summary (Last 24 hours) at 02/14/2021 0914 Last data filed at 02/14/2021 0737 Gross per 24 hour  Intake 969 ml  Output --  Net 969 ml         Physical Exam: Vital Signs Blood pressure 134/66, pulse 76, temperature 98 F (36.7 C), temperature source Oral, resp. rate 18, height 6' 0.01" (1.829 m), weight 77.6 kg, SpO2 99 %. Constitutional: No distress . Vital signs reviewed. HENT: Normocephalic.  Atraumatic. Eyes: EOMI. No discharge. Cardiovascular: No JVD.  RRR. Respiratory: Normal effort.  No stridor.  Bilateral clear to auscultation. GI: Non-distended.  BS +. Skin: Warm and dry.  Scattered abrasions Psych: Normal mood.  Normal behavior. Musc: No significant edema in extremities.  No significant tenderness in extremities. Neuro: Alert and oriented x2 Motor: Grossly 4+/5 throughout   Assessment/Plan: 1. Functional deficits which require 3+ hours per day of interdisciplinary therapy in a comprehensive inpatient rehab setting. Physiatrist is providing close team supervision and 24 hour management of active medical problems listed below. Physiatrist and rehab team continue to assess barriers to discharge/monitor patient progress toward functional and medical goals  Care Tool:  Bathing    Body parts bathed by patient: Right arm, Chest, Left arm, Abdomen, Front perineal area, Right upper leg, Left upper leg, Buttocks, Face   Body parts bathed by helper: Right lower leg, Left  lower leg     Bathing assist Assist Level: Minimal Assistance - Patient > 75%     Upper Body Dressing/Undressing Upper body dressing   What is the patient wearing?: Pull over shirt    Upper body assist Assist Level: Minimal Assistance - Patient > 75%    Lower Body Dressing/Undressing Lower body dressing      What is the patient wearing?: Pants     Lower body assist Assist for lower body dressing: Moderate Assistance - Patient 50 - 74%     Toileting Toileting Toileting Activity did not occur Landscape architect and hygiene only): N/A (no void or bm)  Toileting assist Assist for toileting: Contact Guard/Touching assist     Transfers Chair/bed transfer  Transfers assist  Chair/bed transfer activity did not occur: Safety/medical concerns (Pt unable to maintian alertness)  Chair/bed transfer assist level: Minimal Assistance - Patient > 75%     Locomotion Ambulation   Ambulation assist      Assist level: 2 helpers Assistive device: Hand held assist Max distance: 100   Walk 10 feet activity   Assist     Assist level: Minimal Assistance - Patient > 75% Assistive device: Hand held assist   Walk 50 feet activity  Assist    Assist level: 2 helpers Assistive device: Hand held assist    Walk 150 feet activity   Assist Walk 150 feet activity did not occur: Safety/medical concerns         Walk 10 feet on uneven surface  activity   Assist     Assist level: Minimal Assistance - Patient > 75% Assistive device: Hand held assist, Other (comment) (and use of handrail)   Wheelchair     Assist Is the patient using a wheelchair?: Yes Type of Wheelchair: Manual    Wheelchair assist level: Total Assistance - Patient < 25% Max wheelchair distance: 150    Wheelchair 50 feet with 2 turns activity    Assist        Assist Level: Total Assistance - Patient < 25%   Wheelchair 150 feet activity     Assist      Assist Level: Total  Assistance - Patient < 25%   Blood pressure 134/66, pulse 76, temperature 98 F (36.7 C), temperature source Oral, resp. rate 18, height 6' 0.01" (1.829 m), weight 77.6 kg, SpO2 99 %.  Medical Problem List and Plan: 1.  TBI/concussion secondary to fall 02/03/2021.  Cranial CT scan negative.  Continue CIR 2.  Antithrombotics: -DVT/anticoagulation:  Pharmaceutical: Other (comment)/Eliquis             -antiplatelet therapy: N/A 3. Pain Management: Lidoderm patch, Robaxin as needed, Ultram as needed             Monitor with increased exertion  Controlled with meds on 10/15 4. Mood/sleep/mental status:               -antipsychotic agents: N/A  -sleep chart  -continue scheduled seroquel at 2100  -continue ritalin to help with arousal, attention and focus during the day  -Staph Epi UTI: (+UCX 10/8)   -pan sensitive--- doxycycline for 5 days beginning 10/13  Telemetry sitter for safety 5. Neuropsych: This patient is not yet capable of making decisions on his own behalf. 6. Skin/Wound Care: Left facial laceration or abrasion.  Sutures removed.  Routine skin checks 7. Fluids/Electrolytes/Nutrition: Routine in and outs             encourage PO  -added protein supp for low albumin 8.  Multiple left rib fractures with extrapleural hematoma.  Conservative management 9.  Density right middle lung lobe.  Incidental on CT.  Follow-up outpatient 10.  History of DVT/pulmonary emboli.  Eliquis 11.  Essential hypertension: Tenormin 50 mg daily  Borderline elevated on 10/15 12.  BPH.  Flomax 0.4 mg daily.  voiding and continent x 24 hours 13.  Hyperlipidemia.  Pravachol 14.  Hypothyroidism.  Synthroid 15.  Drug-induced constipation.  MiraLAX daily, Colace 100 mg twice daily             Adjust bowel meds as necessary 16.  Transaminitis  ALT mildly elevated on 10/13, continue to monitor  LOS: 3 days A FACE TO FACE EVALUATION WAS PERFORMED  Laron Angelini Lorie Phenix 02/14/2021, 9:14 AM

## 2021-02-15 DIAGNOSIS — N390 Urinary tract infection, site not specified: Secondary | ICD-10-CM

## 2021-02-15 DIAGNOSIS — E039 Hypothyroidism, unspecified: Secondary | ICD-10-CM

## 2021-02-15 NOTE — Progress Notes (Signed)
Physical Therapy Session Note  Patient Details  Name: Brandon Burke MRN: [id number] Date of Birth: July 12, 1931  Today's Date: 02/15/2021 PT Individual Time: 0900-0940 PT Individual Time Calculation (min): 40 min   Short Term Goals: Week 1:  PT Short Term Goal 1 (Week 1): Pt will perform supine to/from sit w/min assist PT Short Term Goal 2 (Week 1): Pt will perform basic transfers w/cga and minimal cueing for safety PT Short Term Goal 3 (Week 1): Pt will ambulate 157ft w/cga to min assist in environment w/minimal to no distractions and no AD PT Short Term Goal 4 (Week 1): Pt will ambulat >148ft w/LRAD in busy environment including turns w/cga  Skilled Therapeutic Interventions/Progress Updates:   Received pt slouched down in bed stating "where's my wife?" and perseverating on seeing her throughout entire session. Therapist attempted to call pt's wife (number written on board) but no answer - increased time spent redirecting pt and orienting him. Pt agreeable to PT treatment and denied any pain during session. Session with emphasis on functional mobility/transfers, generalized strengthening, dynamic standing balance/coordination, gait training, motor planning/sequencing, and improved activity tolerance. Supine<>sitting EOB with supervision and cues for sequencing as pt stating "how do I sit up?" and transferred bed<>WC stand<>pivot without AD and min A. Pt transported to/from room in [facility] total A for time management purposes. Sit<>stands with RW and CGA throughout session and ambulated 180ft with RW and min A with +2 for WC follow initially (but not truly needed). Pt required mod/max cues for visual scanning and direction (especially when turning) and occasional mod A to steer RW, as pt almost running directly into door oblivious to opening immediately to L as well as running RW into walls on L and R sides. [employer] dual tasking having pt identify objects on walls, read signs, and distinguish  between colors. Pt demonstrated decreased cadence with random stops, various step lengths and turns, and mild impulsivity overall. Found note on pt's table from wife stating that she will arrive before 10am; read note aloud and pt calmed down after hearing this. Concluded session with pt semi-reclined in bed, needs within reach, and bed alarm on. Telesitter in place and wife arrived at end of session.   Therapy Documentation Precautions:  Precautions Precautions: Fall Restrictions Weight Bearing Restrictions: No  Therapy/Group: Individual Therapy [name] PT, DPT   02/15/2021, 7:11 AM

## 2021-02-15 NOTE — Plan of Care (Signed)
  Problem: Consults Goal: RH BRAIN INJURY PATIENT EDUCATION Description: Description: See Patient Education module for eduction specifics 02/15/2021 0956 by Sanda Linger, LPN Outcome: Progressing 02/15/2021 0956 by Sanda Linger, LPN Outcome: Progressing   Problem: RH BOWEL ELIMINATION Goal: RH STG MANAGE BOWEL WITH ASSISTANCE Description: STG Manage Bowel with Supervision Assistance. 02/15/2021 0956 by Sanda Linger, LPN Outcome: Progressing 02/15/2021 0956 by Sanda Linger, LPN Outcome: Progressing Goal: RH STG MANAGE BOWEL W/MEDICATION W/ASSISTANCE Description: STG Manage Bowel with Medication with Supervision Assistance. 02/15/2021 0956 by Sanda Linger, LPN Outcome: Progressing 02/15/2021 0956 by Sanda Linger, LPN Outcome: Progressing   Problem: RH SKIN INTEGRITY Goal: RH STG MAINTAIN SKIN INTEGRITY WITH ASSISTANCE Description: STG Maintain Skin Integrity With Supervision Assistance. 02/15/2021 0956 by Sanda Linger, LPN Outcome: Progressing 02/15/2021 0956 by Sanda Linger, LPN Outcome: Progressing Goal: RH STG ABLE TO PERFORM INCISION/WOUND CARE W/ASSISTANCE Description: STG Able To Perform Incision/Wound Care With Supervision Assistance. 02/15/2021 0956 by Renda Rolls L, LPN Outcome: Progressing 02/15/2021 0956 by Sanda Linger, LPN Outcome: Progressing   Problem: RH SAFETY Goal: RH STG ADHERE TO SAFETY PRECAUTIONS W/ASSISTANCE/DEVICE Description: STG Adhere to Safety Precautions With Supervision Assistance/Device. 02/15/2021 0956 by Sanda Linger, LPN Outcome: Progressing 02/15/2021 0956 by Sanda Linger, LPN Outcome: Progressing Goal: RH STG DECREASED RISK OF FALL WITH ASSISTANCE Description: STG Decreased Risk of Fall With Supervision Assistance. 02/15/2021 0956 by Sanda Linger, LPN Outcome: Progressing 02/15/2021 0956 by Sanda Linger, LPN Outcome: Progressing   Problem: RH COGNITION-NURSING Goal: RH STG USES MEMORY AIDS/STRATEGIES  W/ASSIST TO PROBLEM SOLVE Description: STG Uses Memory Aids/Strategies With Supervision Assistance to Problem Solve. 02/15/2021 0956 by Sanda Linger, LPN Outcome: Progressing 02/15/2021 0956 by Sanda Linger, LPN Outcome: Progressing Goal: RH STG ANTICIPATES NEEDS/CALLS FOR ASSIST W/ASSIST/CUES Description: STG Anticipates Needs/Calls for Assist With Supervision Assistance/Cues. 02/15/2021 0956 by Renda Rolls L, LPN Outcome: Progressing 02/15/2021 0956 by Sanda Linger, LPN Outcome: Progressing   Problem: RH PAIN MANAGEMENT Goal: RH STG PAIN MANAGED AT OR BELOW PT'S PAIN GOAL Description: < 2 on a 0-10 pain scale. Outcome: Progressing   Problem: RH KNOWLEDGE DEFICIT BRAIN INJURY Goal: RH STG INCREASE KNOWLEDGE OF SELF CARE AFTER BRAIN INJURY Description: Patient will demonstrate knowledge of medication management, pain management, bowel management with educational materials and handouts provided by staff independently at discharge. Outcome: Progressing

## 2021-02-15 NOTE — Progress Notes (Signed)
Speech Language Pathology Daily Session Note  Patient Details  Name: Brandon Burke MRN: [id number] Date of Birth: January 14, 1932  Today's Date: 02/15/2021 SLP Individual Time: 1420-1500 SLP Individual Time Calculation (min): 40 min  Short Term Goals: Week 1: SLP Short Term Goal 1 (Week 1): Patient will utilize external memory aids for orientation X 4 with Min verbal and visual cues. SLP Short Term Goal 2 (Week 1): Patient will demonstrate functional problem solving for basic and familiar tasks with Min verbal cues. SLP Short Term Goal 3 (Week 1): Patient will demosntrate sustained attention to functional tasks for 15 minutes with Min verbal cues for redirection.  Skilled Therapeutic Interventions: Skilled SLP intervention focused on cognition. Pt completed simple visual problem solving task with max A verbal cues. He required max A for redirection and processing simple questions related to information on calendar. He increased accuracy when information was given with simple visual cues to answer problem solving questions. Pt left seated in wheelchair with chair alarm set and call button within reach. Cont with therapy per plan of care.      Pain Pain Assessment Pain Scale: Faces Faces Pain Scale: No hurt  Therapy/Group: Individual Therapy  Darrol Poke Brandon Burke 02/15/2021, 3:21 PM

## 2021-02-15 NOTE — Progress Notes (Signed)
PROGRESS NOTE   Subjective/Complaints: Patient seen laying in bed this morning.  He states he slept well over night, confirmed with sleep chart.  He is confused upon arousal this morning.  ROS: Appears to deny CP, SOB, N/V/D  Objective:   No results found. No results for input(s): WBC, HGB, HCT, PLT in the last 72 hours.  No results for input(s): NA, K, CL, CO2, GLUCOSE, BUN, CREATININE, CALCIUM in the last 72 hours.   Intake/Output Summary (Last 24 hours) at 02/15/2021 0800 Last data filed at 02/14/2021 1804 Gross per 24 hour  Intake 356 ml  Output --  Net 356 ml         Physical Exam: Vital Signs Blood pressure (!) 123/50, pulse 72, temperature 98.5 F (36.9 C), resp. rate 18, height 6' 0.01" (1.829 m), weight 77.6 kg, SpO2 95 %. Constitutional: No distress . Vital signs reviewed. HENT: Normocephalic.  Atraumatic. Eyes: EOMI. No discharge. Cardiovascular: No JVD.  RRR. Respiratory: Normal effort.  No stridor.  Bilateral clear to auscultation. GI: Non-distended.  BS +. Skin: Warm and dry.  Intact. Psych: Normal mood.  Normal behavior. Musc: No edema in extremities.  No tenderness in extremities. Neuro: Alert and oriented x1 Motor: Grossly 4+/5 throughout, unchanged  Assessment/Plan: 1. Functional deficits which require 3+ hours per day of interdisciplinary therapy in a comprehensive inpatient rehab setting. Physiatrist is providing close team supervision and 24 hour management of active medical problems listed below. Physiatrist and rehab team continue to assess barriers to discharge/monitor patient progress toward functional and medical goals  Care Tool:  Bathing    Body parts bathed by patient: Right arm, Chest, Left arm, Abdomen, Front perineal area, Right upper leg, Left upper leg, Buttocks, Face   Body parts bathed by helper: Right lower leg, Left lower leg     Bathing assist Assist Level: Minimal  Assistance - Patient > 75%     Upper Body Dressing/Undressing Upper body dressing   What is the patient wearing?: Pull over shirt    Upper body assist Assist Level: Minimal Assistance - Patient > 75%    Lower Body Dressing/Undressing Lower body dressing      What is the patient wearing?: Pants     Lower body assist Assist for lower body dressing: Moderate Assistance - Patient 50 - 74%     Toileting Toileting Toileting Activity did not occur Landscape architect and hygiene only): N/A (no void or bm)  Toileting assist Assist for toileting: Contact Guard/Touching assist     Transfers Chair/bed transfer  Transfers assist  Chair/bed transfer activity did not occur: Safety/medical concerns (Pt unable to maintian alertness)  Chair/bed transfer assist level: Minimal Assistance - Patient > 75%     Locomotion Ambulation   Ambulation assist      Assist level: 2 helpers Assistive device: Hand held assist Max distance: 100   Walk 10 feet activity   Assist     Assist level: Minimal Assistance - Patient > 75% Assistive device: Hand held assist   Walk 50 feet activity   Assist    Assist level: 2 helpers Assistive device: Hand held assist    Walk 150 feet activity  Assist Walk 150 feet activity did not occur: Safety/medical concerns         Walk 10 feet on uneven surface  activity   Assist     Assist level: Minimal Assistance - Patient > 75% Assistive device: Hand held assist, Other (comment) (and use of handrail)   Wheelchair     Assist Is the patient using a wheelchair?: Yes Type of Wheelchair: Manual    Wheelchair assist level: Total Assistance - Patient < 25% Max wheelchair distance: 150    Wheelchair 50 feet with 2 turns activity    Assist        Assist Level: Total Assistance - Patient < 25%   Wheelchair 150 feet activity     Assist      Assist Level: Total Assistance - Patient < 25%   Blood pressure (!)  123/50, pulse 72, temperature 98.5 F (36.9 C), resp. rate 18, height 6' 0.01" (1.829 m), weight 77.6 kg, SpO2 95 %.  Medical Problem List and Plan: 1.  TBI/concussion secondary to fall 02/03/2021.  Cranial CT scan negative.  Continue CIR 2.  Antithrombotics: -DVT/anticoagulation:  Pharmaceutical: Other (comment)/Eliquis             -antiplatelet therapy: N/A 3. Pain Management: Lidoderm patch, Robaxin as needed, Ultram as needed             Monitor with increased exertion  Appears controlled with meds on 10/16 4. Mood/sleep/mental status:               -antipsychotic agents: N/A  -sleep chart  -continue scheduled seroquel at 2100  -continue ritalin to help with arousal, attention and focus during the day  -Staph Epi UTI: (+UCX 10/8)   -pan sensitive--- continue doxycycline for 5 days beginning 10/13  Telemetry sitter for safety 5. Neuropsych: This patient is not yet capable of making decisions on his own behalf. 6. Skin/Wound Care: Left facial laceration or abrasion.  Sutures removed.  Routine skin checks 7. Fluids/Electrolytes/Nutrition: Routine in and outs             encourage PO  -added protein supp for low albumin 8.  Multiple left rib fractures with extrapleural hematoma.  Conservative management 9.  Density right middle lung lobe.  Incidental on CT.  Follow-up outpatient 10.  History of DVT/pulmonary emboli.  Eliquis 11.  Essential hypertension: Tenormin 50 mg daily  Controlled on 10/16 12.  BPH.  Flomax 0.4 mg daily.  voiding and continent x 24 hours 13.  Hyperlipidemia.  Pravachol 14.  Hypothyroidism.  Synthroid 15.  Drug-induced constipation.  MiraLAX daily, Colace 100 mg twice daily             Adjust bowel meds as necessary 16.  Transaminitis  ALT mildly elevated on 10/13, continue to monitor  LOS: 4 days A FACE TO FACE EVALUATION WAS PERFORMED  Clydine Parkison Lorie Phenix 02/15/2021, 8:00 AM

## 2021-02-16 NOTE — Progress Notes (Signed)
PROGRESS NOTE   Subjective/Complaints:  Pt said "ready to go home" since knows where he is and his name, etc- doesn't feel confused anymore- explained he's here for multiple issues including cognition, but since clearing, we consider that to be good news!  ROS:  Pt denies SOB, abd pain, CP, N/V/C/D, and vision changes   Objective:   No results found. No results for input(s): WBC, HGB, HCT, PLT in the last 72 hours.  No results for input(s): NA, K, CL, CO2, GLUCOSE, BUN, CREATININE, CALCIUM in the last 72 hours.   Intake/Output Summary (Last 24 hours) at 02/16/2021 1003 Last data filed at 02/16/2021 0756 Gross per 24 hour  Intake 600 ml  Output --  Net 600 ml        Physical Exam: Vital Signs Blood pressure 135/64, pulse 75, temperature 98.4 F (36.9 C), resp. rate 17, height 6' 0.01" (1.829 m), weight 77.6 kg, SpO2 99 %.    General: awake, alert, appropriate, sitting up in bed; NAD HENT: conjugate gaze; oropharynx moist CV: regular rate; no JVD Pulmonary: CTA B/L; no W/R/R- good air movement GI: soft, NT, ND, (+)BS Psychiatric: appropriate; but impulsive Neurological: Ox3- knows name, year, month- not day/date; knows location and why here.   Skin: Warm and dry.  Intact. Psych: Normal mood.  Normal behavior. Musc: No edema in extremities.  No tenderness in extremities. Neuro: Alert and oriented x1 Motor: Grossly 4+/5 throughout, unchanged  Assessment/Plan: 1. Functional deficits which require 3+ hours per day of interdisciplinary therapy in a comprehensive inpatient rehab setting. Physiatrist is providing close team supervision and 24 hour management of active medical problems listed below. Physiatrist and rehab team continue to assess barriers to discharge/monitor patient progress toward functional and medical goals  Care Tool:  Bathing    Body parts bathed by patient: Right arm, Chest, Left arm,  Abdomen, Front perineal area, Right upper leg, Left upper leg, Buttocks, Face, Right lower leg, Left lower leg   Body parts bathed by helper: Right lower leg, Left lower leg     Bathing assist Assist Level: Contact Guard/Touching assist     Upper Body Dressing/Undressing Upper body dressing   What is the patient wearing?: Pull over shirt    Upper body assist Assist Level: Supervision/Verbal cueing    Lower Body Dressing/Undressing Lower body dressing      What is the patient wearing?: Pants, Incontinence brief     Lower body assist Assist for lower body dressing: Moderate Assistance - Patient 50 - 74%     Toileting Toileting Toileting Activity did not occur Landscape architect and hygiene only): N/A (no void or bm)  Toileting assist Assist for toileting: Contact Guard/Touching assist     Transfers Chair/bed transfer  Transfers assist  Chair/bed transfer activity did not occur: Safety/medical concerns (Pt unable to maintian alertness)  Chair/bed transfer assist level: Minimal Assistance - Patient > 75%     Locomotion Ambulation   Ambulation assist      Assist level: 2 helpers Assistive device: Walker-rolling Max distance: 192ft   Walk 10 feet activity   Assist     Assist level: 2 helpers Assistive device: [employer]  50 feet activity   Assist    Assist level: 2 helpers Assistive device: Walker-rolling    Walk 150 feet activity   Assist Walk 150 feet activity did not occur: Safety/medical concerns  Assist level: 2 helpers Assistive device: Walker-rolling    Walk 10 feet on uneven surface  activity   Assist     Assist level: Minimal Assistance - Patient > 75% Assistive device: Hand held assist, Other (comment) (and use of handrail)   Wheelchair     Assist Is the patient using a wheelchair?: Yes Type of Wheelchair: Manual    Wheelchair assist level: Total Assistance - Patient < 25% Max wheelchair distance: 150     Wheelchair 50 feet with 2 turns activity    Assist        Assist Level: Total Assistance - Patient < 25%   Wheelchair 150 feet activity     Assist      Assist Level: Total Assistance - Patient < 25%   Blood pressure 135/64, pulse 75, temperature 98.4 F (36.9 C), resp. rate 17, height 6' 0.01" (1.829 m), weight 77.6 kg, SpO2 99 %.  Medical Problem List and Plan: 1.  TBI/concussion secondary to fall 02/03/2021.  Cranial CT scan negative.  Continue CIR- PT, OT and SLP  2.  Antithrombotics: -DVT/anticoagulation:  Pharmaceutical: Other (comment)/Eliquis             -antiplatelet therapy: N/A 3. Pain Management: Lidoderm patch, Robaxin as needed, Ultram as needed             Monitor with increased exertion  10/17-pain controlle-d con't regimen 4. Mood/sleep/mental status:               -antipsychotic agents: N/A  -sleep chart  -continue scheduled seroquel at 2100  -continue ritalin to help with arousal, attention and focus during the day  -Staph Epi UTI: (+UCX 10/8)   -pan sensitive--- continue doxycycline for 5 days beginning 10/13  Telemetry sitter for safety 5. Neuropsych: This patient is not yet capable of making decisions on his own behalf.  10/17- cognitively better today- but not at baseline due to impulsiveness.  6. Skin/Wound Care: Left facial laceration or abrasion.  Sutures removed.  Routine skin checks 7. Fluids/Electrolytes/Nutrition: Routine in and outs             encourage PO  -added protein supp for low albumin 8.  Multiple left rib fractures with extrapleural hematoma.  Conservative management 9.  Density right middle lung lobe.  Incidental on CT.  Follow-up outpatient 10.  History of DVT/pulmonary emboli.  Eliquis 11.  Essential hypertension: Tenormin 50 mg daily  10/17- BP 130s/low 140s- con't regimen 12.  BPH.  Flomax 0.4 mg daily.  voiding and continent x 24 hours 13.  Hyperlipidemia.  Pravachol 14.  Hypothyroidism.  Synthroid 15.   Drug-induced constipation.  MiraLAX daily, Colace 100 mg twice daily             Adjust bowel meds as necessary 16.  Transaminitis  ALT mildly elevated on 10/13, continue to monitor  LOS: 5 days A FACE TO FACE EVALUATION WAS PERFORMED  Malyssa Maris 02/16/2021, 10:03 AM

## 2021-02-16 NOTE — Progress Notes (Signed)
Physical Therapy Session Note  Patient Details  Name: Brandon Burke MRN: [id number] Date of Birth: May 24, 1931  Today's Date: 02/16/2021 PT Individual Time: 9643-8381 PT Individual Time Calculation (min): 55 min   Short Term Goals: Week 1:  PT Short Term Goal 1 (Week 1): Pt will perform supine to/from sit w/min assist PT Short Term Goal 2 (Week 1): Pt will perform basic transfers w/cga and minimal cueing for safety PT Short Term Goal 3 (Week 1): Pt will ambulate 13ft w/cga to min assist in environment w/minimal to no distractions and no AD PT Short Term Goal 4 (Week 1): Pt will ambulat >127ft w/LRAD in busy environment including turns w/cga  Skilled Therapeutic Interventions/Progress Updates:   Received pt sitting in recliner with family present at bedside. Pt agreeable to PT treatment and reported pain in L ribcage (premedicated) but did not state level. Session with emphasis on functional mobility/transfers, generalized strengthening, dynamic standing balance/coordination, gait training, NMR, and improved activity tolerance. Sit<>stand with RW and CGA and ambulated 72ft with RW and CGA to [facility] - cues for attention as pt walking right past WC. Pt transported to/from room in [facility] total A for time management purposes. Worked on dynamic standing balance performing the following activities on BITS with emphasis on visual scanning, problem solving, attention, and sequencing: -Tracing 2 objects - 100% accuracy -Maze for 2 minutes 54 seconds with 27 errors and did not complete task - pt with difficulty following instructions and tracing over lines in maze despite visual/verbal cues -Bell cancellation test in 7 minutes with 6 missed bells along L/R perimeters and 17 distractor hits - difficulty determining difference between bells and teapots. Pt then ambulated 568ft with RW and CGA around nurses station - cues for upright posture/gaze and to increase step height. Ambulated additional 112ft with RW and  CGA, with 1 R lateral LOB requiring min A to correct, back to room - cues for direction/memory as pt unsure of room number and walking right past room despite therapist telling him which room it was. Doffed shoes with supervision and transferred sit<>supine with supervision. Concluded session with pt supine in bed, needs within reach, and bed alarm on. Family present at bedside.    Therapy Documentation Precautions:  Precautions Precautions: Fall Restrictions Weight Bearing Restrictions: No  Therapy/Group: Individual Therapy [name] PT, DPT   02/16/2021, 7:44 AM

## 2021-02-16 NOTE — Progress Notes (Signed)
Physical Therapy TBI Note  Patient Details  Name: Brandon Burke MRN: [id number] Date of Birth: [birth year]  Today's Date: 02/16/2021 PT Individual Time: 1120-1200 PT Individual Time Calculation (min): 40 min   Short Term Goals: Week 1:  PT Short Term Goal 1 (Week 1): Pt will perform supine to/from sit w/min assist PT Short Term Goal 2 (Week 1): Pt will perform basic transfers w/cga and minimal cueing for safety PT Short Term Goal 3 (Week 1): Pt will ambulate 123ft w/cga to min assist in environment w/minimal to no distractions and no AD PT Short Term Goal 4 (Week 1): Pt will ambulat >189ft w/LRAD in busy environment including turns w/cga   Skilled Therapeutic Interventions/Progress Updates:  Patient completing ambulatory transfer from bathroom to recliner with RN on entrance to room. Wife and other family member present in room. Patient alert and agreeable to PT session.   Patient with no pain complaint throughout session.  Therapeutic Activity: Transfers: Patient performed sit<>stand and stand pivot transfers throughout session with close supervision/ CGA. Provided verbal cues for hand progression, completing turn with RW prior to descent to sit.  Gait Training:  Patient ambulated >200 x1/ 150' x1 using RW with CGA. Demonstrated slow, but consistent pace throughout. Provided vc/ tc for upright posture, level gaze, visual scanning for clearance of obstacles.  Neuromuscular Re-ed: NMR facilitated during session with focus on static/ dynamic standing balance. Pt guided in reaching activity requiring one step with no UE support to match bean bags to colored targets, then retrieve and hand to therapist at differing heights and distances. Pt also able to complete AP BUE arm movements holding to 2# weighted ball. Progressed to pot-stirrers in CW/ CCW directions, then tested with proactive and reactive balance with arms outstretched anteriorly with no LOB noted.  NMR performed for  improvements in motor control and coordination, balance, sequencing, judgement, and self confidence/ efficacy in performing all aspects of mobility at highest level of independence.   Therapeutic Exercise: Patient performed continuous reciprocation for BLE using Kinetron set at 20cm/ sec. Completes 3 sets of 68min bouts with 104min rest between bouts. Increased respiratory rate noted but pt able to recover [facility] for time. Performed for improved cardiorespiratory activity tolerance.   Patient seated  in recliner at end of session with brakes locked, belt alarm set, and all needs within reach. Tray table in front of pt and clear for lunch tray.     Therapy Documentation Precautions:  Precautions Precautions: Fall Restrictions Weight Bearing Restrictions: No General:   Vital Signs: Therapy Vitals Pulse Rate: 75 BP: 135/64 Pain:  No pain complaint this session; although pt did note soreness at area of rib fractures.  Agitated Behavior Scale: TBI       Therapy/Group: Individual Therapy  Alger Simons PT, DPT 02/16/2021, 10:47 AM

## 2021-02-16 NOTE — Plan of Care (Signed)
  Problem: Consults Goal: RH BRAIN INJURY PATIENT EDUCATION Description: Description: See Patient Education module for eduction specifics Outcome: Progressing   Problem: RH BOWEL ELIMINATION Goal: RH STG MANAGE BOWEL WITH ASSISTANCE Description: STG Manage Bowel with Supervision Assistance. Outcome: Progressing Goal: RH STG MANAGE BOWEL W/MEDICATION W/ASSISTANCE Description: STG Manage Bowel with Medication with Supervision Assistance. Outcome: Progressing   Problem: RH SKIN INTEGRITY Goal: RH STG MAINTAIN SKIN INTEGRITY WITH ASSISTANCE Description: STG Maintain Skin Integrity With Supervision Assistance. Outcome: Progressing Goal: RH STG ABLE TO PERFORM INCISION/WOUND CARE W/ASSISTANCE Description: STG Able To Perform Incision/Wound Care With Supervision Assistance. Outcome: Progressing   Problem: RH SAFETY Goal: RH STG ADHERE TO SAFETY PRECAUTIONS W/ASSISTANCE/DEVICE Description: STG Adhere to Safety Precautions With Supervision Assistance/Device. Outcome: Progressing Goal: RH STG DECREASED RISK OF FALL WITH ASSISTANCE Description: STG Decreased Risk of Fall With Supervision Assistance. Outcome: Progressing   Problem: RH COGNITION-NURSING Goal: RH STG USES MEMORY AIDS/STRATEGIES W/ASSIST TO PROBLEM SOLVE Description: STG Uses Memory Aids/Strategies With Supervision Assistance to Problem Solve. Outcome: Progressing Goal: RH STG ANTICIPATES NEEDS/CALLS FOR ASSIST W/ASSIST/CUES Description: STG Anticipates Needs/Calls for Assist With Supervision Assistance/Cues. Outcome: Progressing   Problem: RH PAIN MANAGEMENT Goal: RH STG PAIN MANAGED AT OR BELOW PT'S PAIN GOAL Description: < 2 on a 0-10 pain scale. Outcome: Progressing   Problem: RH KNOWLEDGE DEFICIT BRAIN INJURY Goal: RH STG INCREASE KNOWLEDGE OF SELF CARE AFTER BRAIN INJURY Description: Patient will demonstrate knowledge of medication management, pain management, bowel management with educational materials and  handouts provided by staff independently at discharge. Outcome: Progressing

## 2021-02-16 NOTE — Progress Notes (Signed)
Speech Language Pathology TBI Note  Patient Details  Name: MAJID MCCRAVY MRN: [id number] Date of Birth: 1931-06-18  Today's Date: 02/16/2021 SLP Individual Time: 1300-1330 SLP Individual Time Calculation (min): 30 min  Short Term Goals: Week 1: SLP Short Term Goal 1 (Week 1): Patient will utilize external memory aids for orientation X 4 with Min verbal and visual cues. SLP Short Term Goal 2 (Week 1): Patient will demonstrate functional problem solving for basic and familiar tasks with Min verbal cues. SLP Short Term Goal 3 (Week 1): Patient will demosntrate sustained attention to functional tasks for 15 minutes with Min verbal cues for redirection.  Skilled Therapeutic Interventions: Pt received in recliner with family present and agreeable to skilled ST intervention with focus on cognitive goals. Pt was oriented to person, situation, and partially oriented to time (month, year only) with min A for use of external aids. Pt disoriented to place by stating he was in [place] and that he also lives in [place]. Spouse expressed they have not lived in [place] for over 15 years. Pt demonstrated limited recall of this throughout session requiring max A verbal reminders. SLP facilitated sustained attention and working memory task in which pt initially required mod A verbal/visual cues and eventually required max A due to increasing internal distraction and limited sustained attention of 1 minutes intervals prior to requiring verbal redirection. Pt perseverated on a story from a previous session and unable to recall this had previously been discussed during multiple occasions. Pt with decreased intellectual awareness as he stated "I used to have memory loss but it all came back" and proceeded  to deny any current cognitive deficits s/p fall. SLP educated family to continue to provide verbal/visual reinforcement to support attention, memory, and orientation. Patient was left in chair with alarm  activated and immediate needs within reach at end of session. Continue per current plan of care.      Pain Pain Assessment Pain Scale: 0-10 Pain Score: 0-No pain  Agitated Behavior Scale: TBI Observation Details Observation Environment: pt room Start of observation period - Date: 02/16/21 Start of observation period - Time: 1300 End of observation period - Date: 02/16/21 End of observation period - Time: 1330 Agitated Behavior Scale (DO NOT LEAVE BLANKS) Short attention span, easy distractibility, inability to concentrate: Present to a moderate degree Impulsive, impatient, low tolerance for pain or frustration: Absent Uncooperative, resistant to care, demanding: Absent Violent and/or threatening violence toward people or property: Absent Explosive and/or unpredictable anger: Absent Rocking, rubbing, moaning, or other self-stimulating behavior: Absent Pulling at tubes, restraints, etc.: Absent Wandering from treatment areas: Absent Restlessness, pacing, excessive movement: Absent Repetitive behaviors, motor, and/or verbal: Present to a slight degree Rapid, loud, or excessive talking: Absent Sudden changes of mood: Absent Easily initiated or excessive crying and/or laughter: Absent Self-abusiveness, physical and/or verbal: Absent Agitated behavior scale total score: 17  Therapy/Group: Individual Therapy  Patty Sermons 02/16/2021, 2:39 PM

## 2021-02-16 NOTE — Progress Notes (Signed)
Occupational Therapy Session Note  Patient Details  Name: NAJEH CREDIT MRN: [id number] Date of Birth: Jan 20, 1932  Today's Date: 02/16/2021 OT Individual Time: 1660-6301 OT Individual Time Calculation (min): 62 min    Short Term Goals: Week 1:  OT Short Term Goal 1 (Week 1): Patient will complete UB dressing with Min A OT Short Term Goal 2 (Week 1): Patient will ambulate to the bathroom with min A OT Short Term Goal 3 (Week 1): Patient will sequence 2 steps of grooming task with min questioning cues  Skilled Therapeutic Interventions/Progress Updates:    Patient in bed, pleasant and cooperative, confused about time of day.  He states that pain is under control at this time.  He demonstrates fair insight and is oriented to place, month and year.  Occ  tearfulness at times during session.   Supine to sitting edge of bed with CS.  Sit to stand and short distance ambulation with RW to/from bed, w/c with CGA - cues for turning and locating seat surface.  He completed oral care/grooming tasks seated at sink with set up, required min A for OH and c-style shirt due to left shoulder discomfort at end range.  He is able to secure buttons.  Incontinence brief and pants with min A.  Clothing management in stance with CGA.  He is able to donn socks and shoes with incidental assist.  Completed long walk on unit >200 feet with CS/CGA, no LOB and no SOB.  He notes fatigue upon return.  Completed standing reach and balance activity, tactile cues for activity after instruction.  To recliner at close of session CGA.  Seat belt alarm set and call bell in hand. Family entered as I was leaving - reviewed progress to date.    Therapy Documentation Precautions:  Precautions Precautions: Fall Restrictions Weight Bearing Restrictions: No   Therapy/Group: Individual Therapy  Carlos Levering 02/16/2021, 7:38 AM

## 2021-02-17 MED ORDER — APIXABAN 5 MG PO TABS
5.0000 mg | ORAL_TABLET | Freq: Two times a day (BID) | ORAL | Status: DC
  Administered 2021-02-17 – 2021-02-19 (×5): 5 mg via ORAL
  Filled 2021-02-17 (×5): qty 1

## 2021-02-17 MED ORDER — QUETIAPINE FUMARATE 25 MG PO TABS
25.0000 mg | ORAL_TABLET | Freq: Every evening | ORAL | Status: DC | PRN
  Administered 2021-02-19 – 2021-02-26 (×4): 25 mg via ORAL
  Filled 2021-02-17 (×3): qty 1

## 2021-02-17 NOTE — Progress Notes (Signed)
PROGRESS NOTE   Subjective/Complaints:  Pt working with SLP on processing and organizational skills. Pt feels well, sleeping better. Denies significant pain  ROS: Limited due to cognitive/behavioral    Objective:   No results found. No results for input(s): WBC, HGB, HCT, PLT in the last 72 hours.  No results for input(s): NA, K, CL, CO2, GLUCOSE, BUN, CREATININE, CALCIUM in the last 72 hours.   Intake/Output Summary (Last 24 hours) at 02/17/2021 0939 Last data filed at 02/17/2021 0759 Gross per 24 hour  Intake 660 ml  Output 200 ml  Net 460 ml        Physical Exam: Vital Signs Blood pressure 135/66, pulse 93, temperature 98 F (36.7 C), resp. rate 18, height 6' 0.01" (1.829 m), weight 77.6 kg, SpO2 97 %.    Constitutional: No distress . Vital signs reviewed. HEENT: NCAT, EOMI, oral membranes moist Neck: supple Cardiovascular: RRR without murmur. No JVD    Respiratory/Chest: CTA Bilaterally without wheezes or rales. Normal effort    GI/Abdomen: BS +, non-tender, non-distended Ext: no clubbing, cyanosis, or edema Psych: pleasant and cooperative  Skin: warm, wounds healing Musc: No edema in extremities.  No tenderness in extremities. Neuro: Alert and oriented x1, struggles with organizational tasks.  Motor: Grossly 4+/5 throughout, unchanged. No focal sensory deficits  Assessment/Plan: 1. Functional deficits which require 3+ hours per day of interdisciplinary therapy in a comprehensive inpatient rehab setting. Physiatrist is providing close team supervision and 24 hour management of active medical problems listed below. Physiatrist and rehab team continue to assess barriers to discharge/monitor patient progress toward functional and medical goals  Care Tool:  Bathing    Body parts bathed by patient: Right arm, Chest, Left arm, Abdomen, Front perineal area, Right upper leg, Left upper leg, Buttocks, Face,  Right lower leg, Left lower leg   Body parts bathed by helper: Right lower leg, Left lower leg     Bathing assist Assist Level: Contact Guard/Touching assist     Upper Body Dressing/Undressing Upper body dressing   What is the patient wearing?: Pull over shirt    Upper body assist Assist Level: Minimal Assistance - Patient > 75%    Lower Body Dressing/Undressing Lower body dressing      What is the patient wearing?: Pants, Incontinence brief     Lower body assist Assist for lower body dressing: Minimal Assistance - Patient > 75%     Toileting Toileting Toileting Activity did not occur (Clothing management and hygiene only): N/A (no void or bm)  Toileting assist Assist for toileting: Contact Guard/Touching assist     Transfers Chair/bed transfer  Transfers assist  Chair/bed transfer activity did not occur: Safety/medical concerns (Pt unable to maintian alertness)  Chair/bed transfer assist level: Contact Guard/Touching assist     Locomotion Ambulation   Ambulation assist      Assist level: Contact Guard/Touching assist Assistive device: Walker-rolling Max distance: 538ft   Walk 10 feet activity   Assist     Assist level: Contact Guard/Touching assist Assistive device: Walker-rolling   Walk 50 feet activity   Assist    Assist level: Contact Guard/Touching assist Assistive device: Walker-rolling    Walk  150 feet activity   Assist Walk 150 feet activity did not occur: Safety/medical concerns  Assist level: Contact Guard/Touching assist Assistive device: Walker-rolling    Walk 10 feet on uneven surface  activity   Assist     Assist level: Minimal Assistance - Patient > 75% Assistive device: Hand held assist, Other (comment) (and use of handrail)   Wheelchair     Assist Is the patient using a wheelchair?: Yes Type of Wheelchair: Manual    Wheelchair assist level: Total Assistance - Patient < 25% Max wheelchair distance: 150     Wheelchair 50 feet with 2 turns activity    Assist        Assist Level: Total Assistance - Patient < 25%   Wheelchair 150 feet activity     Assist      Assist Level: Total Assistance - Patient < 25%   Blood pressure 135/66, pulse 93, temperature 98 F (36.7 C), resp. rate 18, height 6' 0.01" (1.829 m), weight 77.6 kg, SpO2 97 %.  Medical Problem List and Plan: 1.  TBI/concussion secondary to fall 02/03/2021.  Cranial CT scan negative.  -Continue CIR therapies including PT, OT, and SLP. Interdisciplinary team conference today to discuss goals, barriers to discharge, and dc planning.    2.  Antithrombotics: -DVT/anticoagulation:  Pharmaceutical: Other (comment)/Eliquis             -antiplatelet therapy: N/A 3. Pain Management: Lidoderm patch, Robaxin as needed, Ultram as needed             Monitor with increased exertion  10/18-pain controlled   4. Mood/sleep/mental status:               -antipsychotic agents: N/A  -sleep chart  -continue scheduled seroquel at 2100  -continue ritalin to help with arousal, attention and focus during the day  -Staph Epi UTI: (+UCX 10/8)   -pan sensitive---  doxycycline completed  Telemetry sitter for safety 5. Neuropsych: This patient is not yet capable of making decisions on his own behalf.  10/18 improving 6. Skin/Wound Care: Left facial laceration or abrasion.  Sutures removed.  Routine skin checks 7. Fluids/Electrolytes/Nutrition: Routine in and outs             encourage PO  -added protein supp for low albumin  -check labs Thursday 8.  Multiple left rib fractures with extrapleural hematoma.  Conservative management 9.  Density right middle lung lobe.  Incidental on CT.  Follow-up outpatient 10.  History of DVT/pulmonary emboli.  Eliquis 11.  Essential hypertension: Tenormin 50 mg daily  10/18 bp controlled 12.  BPH.  Flomax 0.4 mg daily.  voiding and continent x 24 hours 13.  Hyperlipidemia.  Pravachol 14.   Hypothyroidism.  Synthroid 15.  Drug-induced constipation.  MiraLAX daily, Colace 100 mg twice daily             Adjust bowel meds as necessary 16.  Transaminitis  ALT mildly elevated on 10/13, continue to monitor  LOS: 6 days A FACE TO [place] 02/17/2021, 9:39 AM

## 2021-02-17 NOTE — Progress Notes (Signed)
Patient ID: MCDONALD REILING, male   DOB: 1932/02/13, 87 y.o.   MRN: [id number]  SW met with pt, pt wife [name], and brother [name] to provide updates from team conference, and d/c date 10/29. SW stressed the importance of private aide/support at discharge. SW informed will follow-up with facility to see if they offer any additional services aside from [facility] therapies. Fam edu scheduled for Thurs.,10/27 9am-12pm with his wife, brother and SIL.   Loralee Pacas, MSW, [place] Office: [phone] Cell: [phone] Fax: [phone]

## 2021-02-17 NOTE — Patient Care Conference (Signed)
Inpatient RehabilitationTeam Conference and Plan of Care Update Date: 02/17/2021   Time: 10:38 AM    Patient Name: Brandon Burke [facility]      Medical Record Number: [id number]  Date of Birth: 08-22-1931 Sex: Male         Room/Bed: 4W09C/4W09C-01 Payor Info: Payor: [employer] / Plan: [employer] PPO / Product Type: *No Product type* /    Admit Date/Time:  02/11/2021  3:37 PM  Primary Diagnosis:  TBI (traumatic brain injury)  Hospital Problems: Principal Problem:   TBI (traumatic brain injury) Active Problems:   Transaminitis   History of pulmonary embolus (PE)   Hypothyroidism   Acute lower UTI    Expected Discharge Date: Expected Discharge Date: 02/28/21  Team Members Present: Physician leading conference: Dr. Alger Simons Social Worker Present: Loralee Pacas, [place] Nurse Present: Dorthula Nettles, RN PT Present: Tereasa Coop, PT OT Present: Cherylynn Ridges, OT SLP Present: Weston Anna, SLP PPS Coordinator present : Gunnar Fusi, SLP     Current Status/Progress Goal Weekly Team Focus  Bowel/Bladder   Pt is cont/incont  Pt will gain continence  Will assess qshift and PRN   Swallow/Nutrition/ Hydration             ADL's   adl min A, functional transfers CGA with RW.  rib and left shoulder pain at times, mild confusion  CS  adl / transfer training, balance and general conditioning, patient/family education   Mobility             Communication             Safety/Cognition/ Behavioral Observations  mod-to-max A  Sup A  Orientation with use of external aids, functional recall, problem solving, sustained attention, safety awareness, intellectual awareness   Pain   Pt is currently pain free  Pt will remain pain free  Will assess qshift and PRN   Skin   Pt has multiple bruises from falling prior to coming here  Pt's bruising will heal  Will assess qshift and PRN     Discharge Planning:  Pt to d/c to home with his wife who he lives with at  [facility]. His brother visits from [place] once a week to assist with transportation to appointments, grocery store, etc.   Team Discussion: On Eliquis for PE's, multiple fractures, impaired cognition. Sleeps at night. Labs pending for tomorrow. Continent bladder, incontinent bowel, LBM 10/18. Reports 8/10 pain, has PRN medications available. Patient's brother Kenton Kingfisher comes from [place] to assist but wife prefers no assistance. Wife will not be able to provide a lot of assist. Patient on target to meet rehab goals: yes, contact guard assist for ADL's. Has confusion, requires cues for safety awareness. TV to be off during sleep. Patient fluctuates, gives general answers. Will need a lot of help at home.  *See Care Plan and progress notes for long and short-term goals.   Revisions to Treatment Plan:  Adjusting medications, working on discharge needs  Teaching Needs: Family education, medication management, pain management, bowel management, safety awareness.  Current Barriers to Discharge: Decreased caregiver support, Home enviroment access/layout, Incontinence, Lack of/limited family support, and Medication compliance  Possible Resolutions to Barriers: Family education, brother able to assist, determine discharge needs, timed toileting schedule.     Medical Summary Current Status: TBI with polytrauma after fall. improving cognitively and behaviorally. working through rib pain  Barriers to Discharge: Medical stability   Possible Resolutions to [employer] Focus: pain mgt, normalization of sleep-wake.  daily assessment of nutriiton and patient data   Continued Need for Acute Rehabilitation Level of Care: The patient requires daily medical management by a physician with specialized training in physical medicine and rehabilitation for the following reasons: Direction of a multidisciplinary physical rehabilitation program to maximize functional  independence : Yes Medical management of patient stability for increased activity during participation in an intensive rehabilitation regime.: Yes Analysis of laboratory values and/or radiology reports with any subsequent need for medication adjustment and/or medical intervention. : Yes   I attest that I was present, lead the team conference, and concur with the assessment and plan of the team.   Cristi Loron 02/17/2021, 2:12 PM

## 2021-02-17 NOTE — Progress Notes (Signed)
Speech Language Pathology TBI Note  Patient Details  Name: Brandon Burke MRN: [id number] Date of Birth: May 25, 1931  Today's Date: 02/17/2021 SLP Individual Time: 3329-5188 SLP Individual Time Calculation (min): 40 min  Short Term Goals: Week 1: SLP Short Term Goal 1 (Week 1): Patient will utilize external memory aids for orientation X 4 with Min verbal and visual cues. SLP Short Term Goal 2 (Week 1): Patient will demonstrate functional problem solving for basic and familiar tasks with Min verbal cues. SLP Short Term Goal 3 (Week 1): Patient will demosntrate sustained attention to functional tasks for 15 minutes with Min verbal cues for redirection.  Skilled Therapeutic Interventions: Skilled treatment session focused on cognitive goals. SLP facilitated session by providing Min A verbal and visual cues for use of external aids for orientation to place and date. Patient participated in a basic calendar task and required Mod-Max A verbal cues for problem solving and to locate appropriate dates. Patient with intermittent perseveration on verbal responses but overall continues to demonstrate increased attention to tasks. Patient left upright in wheelchair with alarm on and all needs within reach. Continue with current plan of care.       Pain No/Denies Pain   Agitated Behavior Scale: TBI Observation Details Observation Environment: patient's room Start of observation period - Date: 02/17/21 Start of observation period - Time: 0835 End of observation period - Date: 02/17/21 End of observation period - Time: 0915 Agitated Behavior Scale (DO NOT LEAVE BLANKS) Short attention span, easy distractibility, inability to concentrate: Present to a slight degree Impulsive, impatient, low tolerance for pain or frustration: Absent Uncooperative, resistant to care, demanding: Absent Violent and/or threatening violence toward people or property: Absent Explosive and/or unpredictable anger:  Absent Rocking, rubbing, moaning, or other self-stimulating behavior: Absent Pulling at tubes, restraints, etc.: Absent Wandering from treatment areas: Absent Restlessness, pacing, excessive movement: Absent Repetitive behaviors, motor, and/or verbal: Present to a slight degree Rapid, loud, or excessive talking: Absent Sudden changes of mood: Absent Easily initiated or excessive crying and/or laughter: Absent Self-abusiveness, physical and/or verbal: Absent Agitated behavior scale total score: 16  Therapy/Group: Individual Therapy  Brandon Burke 02/17/2021, 12:20 PM

## 2021-02-17 NOTE — Progress Notes (Signed)
Physical Therapy TBI Note  Patient Details  Name: Brandon Burke MRN: [id number] Date of Birth: 1932-01-29  Today's Date: 02/17/2021 PT Individual Time: 0932-1030 and 1405-1430 PT Individual Time Calculation (min): 58 min and 25 min  Short Term Goals: Week 1:  PT Short Term Goal 1 (Week 1): Pt will perform supine to/from sit w/min assist PT Short Term Goal 2 (Week 1): Pt will perform basic transfers w/cga and minimal cueing for safety PT Short Term Goal 3 (Week 1): Pt will ambulate 170ft w/cga to min assist in environment w/minimal to no distractions and no AD PT Short Term Goal 4 (Week 1): Pt will ambulat >118ft w/LRAD in busy environment including turns w/cga  Skilled Therapeutic Interventions/Progress Updates:     Pt received seated in [facility] and agrees to therapy. Reports some tightness in back. Number not provided. PT provides rest breaks as needed to manage pain. WC transport to gym for time management. Pt ambulates 2x100' with seated rest break. PT provides CGA/minA and cues to increase gait speed to decrease risk for falls. Pt demos increased postural sway and tendency to reach for wall rail for stability. Pt then performs BERG balance assessment, as detailed below, improving score by 13 points from previous trial, though still indicating pt is at high risk for falls. Pt loses balance while attempting to perform toe taps, so remainder of session with focus on single leg stance balance and performing toe taps on 6 inch step. Pt performs in parallel bars. Initially pt performs with both hands on bars, progressing to R hand support, then L hand support, then no upper extremity support. Pt noted to use backs of hands on bars for stability, and also demos slight posterior bias, requiring minA for balance. Pt also has some difficulty following directions, requiring multiple cues to perform toe taps, as pt instead repeatedly performs full step-ups. During step-ups, pt reports increased discomfort  in L lower back. PT educates on body mechanics during sit<>stand to ease strain on back. Pt transported back to room via WC. Left seated in WC with alarm intact and all needs within reach.  2nd Session: Pt received seated in WC, flexed forward at trunk and hips with elbows on knees. Pt states he is in this position due to back pain. Number not provided. PT provides mobility and rest breaks to manage pain. Sit to stand with minA and cues for body mechanics. Pt ambulates x150' with minA at hips due to slight posterior bias and pt reaching for hall rail. Cues to maintain upright gaze and increase trunk rotation and arm swing to improve balance. Pt performs supine to sit with modA and complains of increased pain in back during transition.In supine, pt cued on performance of bridges to strengthen core and bilateral lower extremities as well as provide relief of back pain. Pt completes 3x10 bridges with cues for optimal performance. Supine to sit with modA and cues for logrolling and sequencing. Sit to stand with CGA and pt ambulates x100' back to room with CGA/minA, with cues for increased gait speed to decrease risk for falls. Pt left seated in WC with alarm intact and all needs within reach.   Therapy Documentation Precautions:  Precautions Precautions: Fall Restrictions Weight Bearing Restrictions: No Agitated Behavior Scale: TBI Observation Details Observation Environment: [facility] Start of observation period - Date: 02/17/21 Start of observation period - Time: 0930 End of observation period - Date: 02/17/21 End of observation period - Time: 1030 Agitated Behavior Scale (DO NOT LEAVE  BLANKS) Short attention span, easy distractibility, inability to concentrate: Present to a slight degree Impulsive, impatient, low tolerance for pain or frustration: Absent Uncooperative, resistant to care, demanding: Absent Violent and/or threatening violence toward people or property: Absent Explosive and/or  unpredictable anger: Absent Rocking, rubbing, moaning, or other self-stimulating behavior: Absent Pulling at tubes, restraints, etc.: Absent Wandering from treatment areas: Absent Restlessness, pacing, excessive movement: Absent Repetitive behaviors, motor, and/or verbal: Present to a slight degree Rapid, loud, or excessive talking: Absent Sudden changes of mood: Absent Easily initiated or excessive crying and/or laughter: Absent Self-abusiveness, physical and/or verbal: Absent Agitated behavior scale total score: 16 Balance: Standardized Balance Assessment Standardized Balance Assessment: Berg Balance Test Berg Balance Test Sit to Stand: Able to stand  independently using hands Standing Unsupported: Able to stand 2 minutes with supervision Sitting with Back Unsupported but Feet Supported on Floor or Stool: Able to sit safely and securely 2 minutes Stand to Sit: Controls descent by using hands Transfers: Able to transfer with verbal cueing and /or supervision Standing Unsupported with Eyes Closed: Able to stand 3 seconds Standing Ubsupported with Feet Together: Needs help to attain position but able to stand for 30 seconds with feet together From Standing, Reach Forward with Outstretched Arm: Reaches forward but needs supervision From Standing Position, Pick up Object from Floor: Able to pick up shoe, needs supervision From Standing Position, Turn to Look Behind Over each Shoulder: Needs supervision when turning Turn 360 Degrees: Needs close supervision or verbal cueing Standing Unsupported, Alternately Place Feet on Step/Stool: Needs assistance to keep from falling or unable to try Standing Unsupported, One Foot in Front: Able to plae foot ahead of the other independently and hold 30 seconds Standing on One Leg: Unable to try or needs assist to prevent fall Total Score: 27   Therapy/Group: Individual Therapy  Breck Coons, PT, DPT 02/17/2021, 3:54 PM

## 2021-02-17 NOTE — Progress Notes (Signed)
Occupational Therapy TBI Note  Patient Details  Name: Brandon Burke MRN: [id number] Date of Birth: Feb 27, 1932  Today's Date: 02/17/2021 OT Individual Time: 0347-4259 OT Individual Time Calculation (min): 56 min   Short Term Goals: Week 1:  OT Short Term Goal 1 (Week 1): Patient will complete UB dressing with Min A OT Short Term Goal 2 (Week 1): Patient will ambulate to the bathroom with min A OT Short Term Goal 3 (Week 1): Patient will sequence 2 steps of grooming task with min questioning cues  Skilled Therapeutic Interventions/Progress Updates:      Pt greeted ambulating out of bathroom with nursing, CGA and RW. Handoff to OT for treatment session. Pt needed max multimodal cues to locate recliner and safely sit in recliner. Pt took seated rest break, then bathing/dressing completed sit<>stand at the sink. Pt needed min cues for safety to sit down to doff pants. Standing with CGA for balance while washing peri-area and buttocks. Pt took seated rest break back in recliner. Pt then ambulated back to the sink with RW, CGA and verbal cues for RW positioning at the sink. Pt able to locate toothbrush and toothpaste without cues. He was then able to sequence grooming task without cues. Pt then attempted to sit down without a chair being behind him. OT used her knee to keep pt from fall and pt stood back up with min A. OT provided education on making sure chair is behind him prior to beginning to sit. Hand over hand A to initiate reaching back to wc armrest prior to sitting. Pt brought to therapy gym in wc and worked on problem solving task using graded peg board with simple 2 color pattern. Moderate cues to initiate and problem solve initially, then pt able to get the last 1/2 of the pattern. Pt confusing orange for red, so all oranges removed from bin. Next worked on a 3 color pattern with improved carryover and only minimal cues. Pt returned to room and left seated in wc with alarm belt on, call  bell in reach, and needs met.   Therapy Documentation Precautions:  Precautions Precautions: Fall Restrictions Weight Bearing Restrictions: No Pain: Pain Assessment Pain Scale: 0-10 Pain Score: 7  Pain Type: Acute pain Pain Location: Generalized Pain Descriptors / Indicators: Aching Pain Frequency: Intermittent Pain Onset: With Activity Patients Stated Pain Goal: 1 Pain Intervention(s): Repositioned Agitated Behavior Scale: TBI Observation Details Observation Environment: Patients Room Start of observation period - Date: 02/17/21 Start of observation period - Time: 0730 End of observation period - Date: 02/17/21 End of observation period - Time: 0830 Agitated Behavior Scale (DO NOT LEAVE BLANKS) Short attention span, easy distractibility, inability to concentrate: Present to a slight degree Impulsive, impatient, low tolerance for pain or frustration: Absent Uncooperative, resistant to care, demanding: Absent Violent and/or threatening violence toward people or property: Absent Explosive and/or unpredictable anger: Absent Rocking, rubbing, moaning, or other self-stimulating behavior: Absent Pulling at tubes, restraints, etc.: Absent Wandering from treatment areas: Absent Restlessness, pacing, excessive movement: Absent Repetitive behaviors, motor, and/or verbal: Present to a slight degree Rapid, loud, or excessive talking: Absent Sudden changes of mood: Absent Easily initiated or excessive crying and/or laughter: Absent Self-abusiveness, physical and/or verbal: Absent Agitated behavior scale total score: 16    Therapy/Group: Individual Therapy  Valma Cava 02/17/2021, 12:54 PM

## 2021-02-18 NOTE — Progress Notes (Signed)
Patient ID: Brandon Burke, male   DOB: Aug 18, 1931, 87 y.o.   MRN: [id number]  SW left message for Freda/DON [phone]) to inform on pt d/c date being 10/29. SW inquired about private aide services. SW waiting on follow-up.   SW spoke with Mark/[facility] (p:[phone]/f: [phone]) to discuss sending referral for services. Reports to fax to Attn of therapy team. SW will send next week when therapies are confirmed. Reports family could speak with SW-Linda Wendi Snipes with regard to private aide services since the facility does not offer this service. SW left message for Vaughan Basta  [phone]) to discuss further and waiting on follow-up.   *SW received return phone call  [facility] does not provide aide services.   Loralee Pacas, MSW, [place] Office: [phone] Cell: [phone] Fax: [phone]

## 2021-02-18 NOTE — Progress Notes (Signed)
Speech Language Pathology TBI Note  Patient Details  Name: Brandon Burke MRN: [id number] Date of Birth: 1931/12/08  Today's Date: 02/18/2021 SLP Individual Time: 0815-0900 SLP Individual Time Calculation (min): 45 min  Short Term Goals: Week 1: SLP Short Term Goal 1 (Week 1): Patient will utilize external memory aids for orientation X 4 with Min verbal and visual cues. SLP Short Term Goal 2 (Week 1): Patient will demonstrate functional problem solving for basic and familiar tasks with Min verbal cues. SLP Short Term Goal 3 (Week 1): Patient will demosntrate sustained attention to functional tasks for 15 minutes with Min verbal cues for redirection.  Skilled Therapeutic Interventions: Skilled treatment session focused on cognitive goals. Upon arrival, patient [place] an [place]. Despite max A multimodal cues and external aids, patient remain disoriented for initial 15 minutes of session. Patient transferred to the wheelchair with Min verbal cues for safety and completed basic self-care tasks with at the sink with overall supervision and extra time. SLP transported the patient to the SLP office and provided Max contextual cues as to why he was in the hospital. At end of session, patient was fully oriented to place and situation. Patient perseverative on topics of conversation and required overall Mod verbal cues for redirection with mild intermittent language of confusion throughout. Patient transferred back to bed at end of session per his request and left with lights on and blinds open to improve orientation to time of day. Patient left supine in bed with alarm on and all needs within reach. Continue with current plan of care.      Pain Pain Assessment Pain Scale: Faces Faces Pain Scale: Hurts a little bit Pain Type: Acute pain Pain Location: Generalized  Agitated Behavior Scale: TBI Observation Details Observation Environment: Patient's room Start of observation period -  Date: 02/18/21 Start of observation period - Time: 0815 End of observation period - Date: 02/18/21 End of observation period - Time: 0900 Agitated Behavior Scale (DO NOT LEAVE BLANKS) Short attention span, easy distractibility, inability to concentrate: Present to a slight degree Impulsive, impatient, low tolerance for pain or frustration: Absent Uncooperative, resistant to care, demanding: Absent Violent and/or threatening violence toward people or property: Absent Explosive and/or unpredictable anger: Absent Rocking, rubbing, moaning, or other self-stimulating behavior: Absent Pulling at tubes, restraints, etc.: Absent Wandering from treatment areas: Absent Restlessness, pacing, excessive movement: Absent Repetitive behaviors, motor, and/or verbal: Absent Rapid, loud, or excessive talking: Absent Sudden changes of mood: Absent Easily initiated or excessive crying and/or laughter: Absent Self-abusiveness, physical and/or verbal: Absent Agitated behavior scale total score: 15  Therapy/Group: Individual Therapy  Willma Obando, [place] 02/18/2021, 3:21 PM

## 2021-02-18 NOTE — Progress Notes (Signed)
Physical Therapy Session Note  Patient Details  Name: Brandon Burke MRN: [id number] Date of Birth: [birth year]  Today's Date: 02/18/2021 PT Individual Time: 1405-1450 PT Individual Time Calculation (min): 45 min   Short Term Goals: Week 1:  PT Short Term Goal 1 (Week 1): Pt will perform supine to/from sit w/min assist PT Short Term Goal 2 (Week 1): Pt will perform basic transfers w/cga and minimal cueing for safety PT Short Term Goal 3 (Week 1): Pt will ambulate 189f w/cga to min assist in environment w/minimal to no distractions and no AD PT Short Term Goal 4 (Week 1): Pt will ambulat >1573fw/LRAD in busy environment including turns w/cga   Skilled Therapeutic Interventions/Progress Updates:   Pt received sitting in recliner and agreeable to PT. Pt performed gait training with RW [id number] from PT throughout for safety. Cues for safety through turns and improved weight shift L. Pt performed dynamic balance.cognitive task to sort bean bags into 3 fields with supervision assist for balance and awareness of objects on the R side to be sorted. Pt then able to pick up bean bags with min assist from floor using reacher. Nustep Bil UE/LE with cues for full ORM x 6min 70m Pt rates borg RPE 13/20. Patient returned to room and performed stand pivot to recliner with RW and CGA. Pt left sitting in recliner with call bell in reach and all needs met.          Therapy Documentation Precautions:  Precautions Precautions: Fall Restrictions Weight Bearing Restrictions: No    Vital Signs: Therapy Vitals Temp: 99.6 F (37.6 C) Temp Source: Oral Pulse Rate: 88 Resp: 18 BP: (!) 121/56 Patient Position (if appropriate): Sitting Oxygen Therapy SpO2: 100 % O2 Device: Room Air Pain: Pain Assessment Pain Scale: Faces Faces Pain Scale: Hurts a little bit Pain Type: Acute pain Pain Location: Generalized Ambulation increased   Therapy/Group: Individual Therapy  AustinLorie Phenix/2022, 5:56 PM

## 2021-02-18 NOTE — Progress Notes (Signed)
PROGRESS NOTE   Subjective/Complaints:  Seems to have had a better night. Up on toilet voiding when I entered. Pt tells me he slept  ROS: Limited due to cognitive/behavioral    Objective:   No results found. No results for input(s): WBC, HGB, HCT, PLT in the last 72 hours.  No results for input(s): NA, K, CL, CO2, GLUCOSE, BUN, CREATININE, CALCIUM in the last 72 hours.   Intake/Output Summary (Last 24 hours) at 02/18/2021 0823 Last data filed at 02/18/2021 0724 Gross per 24 hour  Intake 570 ml  Output --  Net 570 ml        Physical Exam: Vital Signs Blood pressure 138/68, pulse 81, temperature 97.9 F (36.6 C), resp. rate 18, height 6' 0.01" (1.829 m), weight 77.6 kg, SpO2 100 %.    Constitutional: No distress . Vital signs reviewed. HEENT: NCAT, EOMI, oral membranes moist Neck: supple Cardiovascular: RRR without murmur. No JVD    Respiratory/Chest: CTA Bilaterally without wheezes or rales. Normal effort    GI/Abdomen: BS +, non-tender, non-distended Ext: no clubbing, cyanosis, or edema Psych: pleasant and cooperative  Skin: warm, wounds, bruises healing Musc: No edema in extremities.  No tenderness in extremities. Neuro: Alert and oriented x1, STM deficits. struggles with organizational tasks.  Motor: Grossly 4+/5 throughout, unchanged. No focal sensory deficits  Assessment/Plan: 1. Functional deficits which require 3+ hours per day of interdisciplinary therapy in a comprehensive inpatient rehab setting. Physiatrist is providing close team supervision and 24 hour management of active medical problems listed below. Physiatrist and rehab team continue to assess barriers to discharge/monitor patient progress toward functional and medical goals  Care Tool:  Bathing    Body parts bathed by patient: Right arm, Chest, Left arm, Abdomen, Front perineal area, Right upper leg, Left upper leg, Buttocks, Face,  Right lower leg, Left lower leg   Body parts bathed by helper: Right lower leg, Left lower leg     Bathing assist Assist Level: Contact Guard/Touching assist     Upper Body Dressing/Undressing Upper body dressing   What is the patient wearing?: Pull over shirt    Upper body assist Assist Level: Minimal Assistance - Patient > 75%    Lower Body Dressing/Undressing Lower body dressing      What is the patient wearing?: Pants, Incontinence brief     Lower body assist Assist for lower body dressing: Minimal Assistance - Patient > 75%     Toileting Toileting Toileting Activity did not occur (Clothing management and hygiene only): N/A (no void or bm)  Toileting assist Assist for toileting: Contact Guard/Touching assist     Transfers Chair/bed transfer  Transfers assist  Chair/bed transfer activity did not occur: Safety/medical concerns (Pt unable to maintian alertness)  Chair/bed transfer assist level: Contact Guard/Touching assist     Locomotion Ambulation   Ambulation assist      Assist level: Contact Guard/Touching assist Assistive device: Walker-rolling Max distance: 550ft   Walk 10 feet activity   Assist     Assist level: Contact Guard/Touching assist Assistive device: Walker-rolling   Walk 50 feet activity   Assist    Assist level: Contact Guard/Touching assist Assistive device: Walker-rolling  Walk 150 feet activity   Assist Walk 150 feet activity did not occur: Safety/medical concerns  Assist level: Contact Guard/Touching assist Assistive device: Walker-rolling    Walk 10 feet on uneven surface  activity   Assist     Assist level: Minimal Assistance - Patient > 75% Assistive device: Hand held assist, Other (comment) (and use of handrail)   Wheelchair     Assist Is the patient using a wheelchair?: Yes Type of Wheelchair: Manual    Wheelchair assist level: Total Assistance - Patient < 25% Max wheelchair distance: 150     Wheelchair 50 feet with 2 turns activity    Assist        Assist Level: Total Assistance - Patient < 25%   Wheelchair 150 feet activity     Assist      Assist Level: Total Assistance - Patient < 25%   Blood pressure 138/68, pulse 81, temperature 97.9 F (36.6 C), resp. rate 18, height 6' 0.01" (1.829 m), weight 77.6 kg, SpO2 100 %.  Medical Problem List and Plan: 1.  TBI/concussion secondary to fall 02/03/2021.  Cranial CT scan negative.  -Continue CIR therapies including PT, OT, and SLP   2.  Antithrombotics: -DVT/anticoagulation:   Eliquis             -antiplatelet therapy: N/A 3. Pain Management: Lidoderm patch, Robaxin as needed, Ultram as needed             Monitor with increased exertion  10/19-pain controlled   4. Mood/sleep/mental status:               -antipsychotic agents: seroquel at HS with PRN dose  -sleep chart   -reduce stimuli at night (TV)  -continue ritalin to help with arousal, attention and focus during the day  -Staph Epi UTI: (+UCX 10/8)   -pan sensitive---  doxycycline completed  Telemetry sitter for safety 5. Neuropsych: This patient is not yet capable of making decisions on his own behalf.  10/18 improving 6. Skin/Wound Care: Left facial laceration or abrasion.  Sutures removed.  Routine skin checks 7. Fluids/Electrolytes/Nutrition: Routine in and outs             encourage PO  -added protein supp for low albumin  -re check labs Thursday 8.  Multiple left rib fractures with extrapleural hematoma.  Conservative management 9.  Density right middle lung lobe.  Incidental on CT.  Follow-up outpatient 10.  History of DVT/pulmonary emboli.  Eliquis 11.  Essential hypertension: Tenormin 50 mg daily  10/19 bp controlled 12.  BPH.  Flomax 0.4 mg daily.  voiding and continent x 24 hours 13.  Hyperlipidemia.  Pravachol 14.  Hypothyroidism.  Synthroid 15.  Drug-induced constipation.  MiraLAX daily, Colace 100 mg twice daily              Adjust bowel meds as necessary 16.  Transaminitis  ALT mildly elevated on 10/13, check Thursday  LOS: 7 days A FACE TO FACE EVALUATION WAS PERFORMED  Meredith Staggers 02/18/2021, 8:23 AM

## 2021-02-18 NOTE — Progress Notes (Signed)
Occupational Therapy Session Note  Patient Details  Name: Brandon Burke MRN: [id number] Date of Birth: [birth year]  Today's Date: 02/18/2021 OT Individual Time: 7858-8502 OT Individual Time Calculation (min): 25 min    Short Term Goals: Week 1:  OT Short Term Goal 1 (Week 1): Patient will complete UB dressing with Min A OT Short Term Goal 2 (Week 1): Patient will ambulate to the bathroom with min A OT Short Term Goal 3 (Week 1): Patient will sequence 2 steps of grooming task with min questioning cues   Skilled Therapeutic Interventions/Progress Updates:    Pt greeted at time of session semireclined in bed, no pain, agreeable to OT session and asking where we are located. Pt oriented to [facility] and [place], pt hyperverbal throughout session regarding past work experiences and attempted to gently re-orient to tasks throughout, but very pleasant. Supine > sit Supervision and ambulated bed > bathroom > sink > wheelchair with CGA and RW. 3/3 toileting tasks supervision/CGA as well. Wheelchair transport to gym for time conservation, and pt performing 1 round of copying colorful blocks for processing and problem solving, needing Mod cues to self correct. Pt becoming emotional talking about wife and her health issues, transported back to room and ambulated to bed same manner as above. In bed resting alarm on call bell in reach and telesitter in room.   Therapy Documentation Precautions:  Precautions Precautions: Fall Restrictions Weight Bearing Restrictions: No    Therapy/Group: Individual Therapy  Viona Gilmore 02/18/2021, 7:16 AM

## 2021-02-18 NOTE — Progress Notes (Signed)
Physical Therapy TBI Note  Patient Details  Name: Brandon Burke MRN: [id number] Date of Birth: 27-Sep-1931  Today's Date: 02/18/2021 PT Individual Time: 1005-1102 PT Individual Time Calculation (min): 57 min   Short Term Goals: Week 1:  PT Short Term Goal 1 (Week 1): Pt will perform supine to/from sit w/min assist PT Short Term Goal 2 (Week 1): Pt will perform basic transfers w/cga and minimal cueing for safety PT Short Term Goal 3 (Week 1): Pt will ambulate 143ft w/cga to min assist in environment w/minimal to no distractions and no AD PT Short Term Goal 4 (Week 1): Pt will ambulat >179ft w/LRAD in busy environment including turns w/cga  Skilled Therapeutic Interventions/Progress Updates:     Pt received supine in bed and agrees to therapy. Oriented to person, place, and situation. Says that it's 2014, however. Pt reports some stiffness in left low back but improved from yesterday. Number not provided. PT provides rest breaks and mobility to manage pain. Pt performs supine to sit with bed features and verbal cues for logrolling technique. Pt dons shoes at EOB with setup assistance. Sit to stand and stand step transfer to [facility] with close supervision and cues for initiation and sequencing. WC transport to gym for time management. Pt transfers to Nustep with cues for positioning. Pt completes nustep for strength and endurance training, and also reports that activity helps alleviate back pain. Pt completes x10:00 at workload of 5 with aver age steps per minute ~50.  Pt ambulates x150' with minA and no AD, with persistent slight R bias and reaching for objects on the R for support. Following rest break, pt ambulates additional 150' with RW and close supervision, with cues for safe RW management and maintaining upright gaze to improve posture and balance.  Pt performs NMR for standing balance with task of tapping alternating toes on 6 inch step. Pt completes x12 with each foot and performs  primarily with CGA and 1-2 instances of minA due to LOB to the R. PT cues for upright gaze to improve posture and balance. Following seated rest break, pt tasked with completing x20 toe taps with each foot. Initially pt performing with CGA but with fatigue begins to require minA, and after x15 reps pt has complete LOB requiring maxA to prevent fall. PT educates on importance of maintaining COG in anterior portion of foot as pt tends to stand with posterior bias.  WC transport back to room. Pt performs stand step transfer back to recliner with CGA and cues for positioning. Left seated with alarm intact and all needs within reach.  Therapy Documentation Precautions:  Precautions Precautions: Fall Restrictions Weight Bearing Restrictions: No    Therapy/Group: Individual Therapy  Breck Coons, PT, DPT 02/18/2021, 4:09 PM

## 2021-02-18 NOTE — Progress Notes (Signed)
Occupational Therapy TBI Note  Patient Details  Name: Brandon Burke MRN: [id number] Date of Birth: [birth year]  Today's Date: 02/18/2021 OT Individual Time: 1137-1202 OT Individual Time Calculation (min): 25 min    Short Term Goals: Week 1:  OT Short Term Goal 1 (Week 1): Patient will complete UB dressing with Min A OT Short Term Goal 2 (Week 1): Patient will ambulate to the bathroom with min A OT Short Term Goal 3 (Week 1): Patient will sequence 2 steps of grooming task with min questioning cues  Skilled Therapeutic Interventions/Progress Updates:    Pt received seated in recliner with family present, denies pain, agreeable to therapy. Session focus on functional cognition, dynamic standing balance, activity tolerance, attention to task in prep for improved ADL/IADL/func mobility performance + decreased caregiver burden. Sit to stand and ambulated with RW throughout session with CGA to min A for safe RW use. Pt did required max cues for initial sit to stand due to pulling down on RW and then holding it in the air when instructed to push up from recliner arm rests.   Played 4 rounds of corn hole with RW + CGA throughout for balance. Pt required max A to keep track of points from each round, but able to accurately talley bags on board.  Pt reports month and year accurately with increased time and oriented to location/situation "I hit my hard head on a rock."  Ambulatory transfer back to recliner same manner as before, max cues for path finding back to room.   Pt left seated in recliner with family/NT present with safety belt alarm engaged, call bell in reach, and all immediate needs met.    Therapy Documentation Precautions:  Precautions Precautions: Fall Restrictions Weight Bearing Restrictions: No  Pain:   No c/o Agitated Behavior Scale: TBI Observation Details Observation Environment: pt room Start of observation period - Date: 02/17/21 Start of observation period -  Time: 1137 End of observation period - Date: 02/17/21 End of observation period - Time: 1202 Agitated Behavior Scale (DO NOT LEAVE BLANKS) Short attention span, easy distractibility, inability to concentrate: Present to a slight degree Impulsive, impatient, low tolerance for pain or frustration: Absent Uncooperative, resistant to care, demanding: Absent Violent and/or threatening violence toward people or property: Absent Explosive and/or unpredictable anger: Absent Rocking, rubbing, moaning, or other self-stimulating behavior: Absent Pulling at tubes, restraints, etc.: Absent Wandering from treatment areas: Absent Restlessness, pacing, excessive movement: Absent Repetitive behaviors, motor, and/or verbal: Absent Rapid, loud, or excessive talking: Absent Sudden changes of mood: Absent Easily initiated or excessive crying and/or laughter: Absent Self-abusiveness, physical and/or verbal: Absent Agitated behavior scale total score: 15  ADL: See Care Tool for more details.    Therapy/Group: Individual Therapy  Volanda Napoleon MS, OTR/L  02/18/2021, 12:15 PM

## 2021-02-18 NOTE — Progress Notes (Signed)
Recreational Therapy Session Note  Patient Details  Name: CIRILO CANNER MRN: [id number] Date of Birth: [birth year] Today's Date: 02/18/2021  Pain: no c/o Pt participated in animal assisted activity seated w/c level with supervision.  Pt easily engaged in conversation with [facility] team and appreciative of this visit. Pt response:   Alazne Quant 02/18/2021, 3:08 PM

## 2021-02-19 LAB — COMPREHENSIVE METABOLIC PANEL
ALT: 30 U/L (ref 0–44)
AST: 27 U/L (ref 15–41)
Albumin: 3.1 g/dL — ABNORMAL LOW (ref 3.5–5.0)
Alkaline Phosphatase: 126 U/L (ref 38–126)
Anion gap: 7 (ref 5–15)
BUN: 29 mg/dL — ABNORMAL HIGH (ref 8–23)
CO2: 25 mmol/L (ref 22–32)
Calcium: 9.1 mg/dL (ref 8.9–10.3)
Chloride: 108 mmol/L (ref 98–111)
Creatinine, Ser: 1.25 mg/dL — ABNORMAL HIGH (ref 0.61–1.24)
GFR, Estimated: 56 mL/min — ABNORMAL LOW (ref >60)
Glucose, Bld: 102 mg/dL — ABNORMAL HIGH (ref 70–99)
Potassium: 3.9 mmol/L (ref 3.5–5.1)
Sodium: 140 mmol/L (ref 135–145)
Total Bilirubin: 1.1 mg/dL (ref 0.3–1.2)
Total Protein: 5.9 g/dL — ABNORMAL LOW (ref 6.5–8.1)

## 2021-02-19 LAB — CBC
HCT: 36.3 % — ABNORMAL LOW (ref 39.0–52.0)
Hemoglobin: 12.5 g/dL — ABNORMAL LOW (ref 13.0–17.0)
MCH: 33.7 pg (ref 26.0–34.0)
MCHC: 34.4 g/dL (ref 30.0–36.0)
MCV: 97.8 fL (ref 80.0–100.0)
Platelets: 247 10*3/uL (ref 150–400)
RBC: 3.71 MIL/uL — ABNORMAL LOW (ref 4.22–5.81)
RDW: 13 % (ref 11.5–15.5)
WBC: 6.1 10*3/uL (ref 4.0–10.5)
nRBC: 0 % (ref 0.0–0.2)

## 2021-02-19 MED ORDER — APIXABAN 2.5 MG PO TABS
2.5000 mg | ORAL_TABLET | Freq: Two times a day (BID) | ORAL | Status: DC
  Administered 2021-02-19 – 2021-02-28 (×18): 2.5 mg via ORAL
  Filled 2021-02-19 (×18): qty 1

## 2021-02-19 MED ORDER — SODIUM CHLORIDE 0.45 % IV SOLN: INTRAVENOUS | Status: AC

## 2021-02-19 NOTE — Progress Notes (Signed)
Speech Language Pathology Daily Session Note  Patient Details  Name: Brandon Burke MRN: [id number] Date of Birth: [birth year]  Today's Date: 02/19/2021 SLP Individual Time: 1300-1400 SLP Individual Time Calculation (min): 60 min  Short Term Goals: Week 1: SLP Short Term Goal 1 (Week 1): Patient will utilize external memory aids for orientation X 4 with Min verbal and visual cues. SLP Short Term Goal 2 (Week 1): Patient will demonstrate functional problem solving for basic and familiar tasks with Min verbal cues. SLP Short Term Goal 3 (Week 1): Patient will demosntrate sustained attention to functional tasks for 15 minutes with Min verbal cues for redirection.  Skilled Therapeutic Interventions:   Patient seen for skilled ST session focusing on cognitive function goals. He was sitting in recliner when SLP arrived with his brother and wife present. Patient was oriented to city but required verbal cue to use printed orientation information to report that he was at [facility] hospital. He then oriented to date using calendar on wall. When SLP asked who was present in the room, he said "my brother and my sister" but when SLP asked again later on, he said, "my brother and my wife". Patient was agreeable to getting into [facility] and going to therapy room for session. He required frequent verbal cues to redirect as he would perseverate on previous thought or task. He was able to determine number of pills to take during simulated and basic medication label task with minA cues. During structured conversation regarding patient's fall as well as hospitalization, he was fixated on having been "flown from [facility] to [facility]" and that this was causing confusion for him. Despite frequent attempts, patient unable to demonstrate full understanding that he has not been traveling and was not currently in [facility]. In addition, when patient describing events surrounding his fall, he was very inconsistent and each retelling would vary.  When back in room, SLP assisted patient with ambulating via RW to bathroom. He required verbal cues physical assist secondary to impulsivity and poor awareness to RW positioning especially when coming through doorway and turning. SLP discussed session with patient's wife and his brother. His wife reported that "That's the first time he got me confused with his sister". Apparently patient had recently been talking a lot about his sister and so this likely is why it was on his mind. Patient left with family present, sitting in recliner with alarm belt in place. He continues to benefit from skilled SLP intervention to maximize cognitive function goals prior to discharge.  Pain Pain Assessment Pain Scale: 0-10 Pain Score: 0-No pain  Therapy/Group: Individual Therapy  Sonia Baller, MA, CCC-SLP Speech Therapy

## 2021-02-19 NOTE — Progress Notes (Signed)
PROGRESS NOTE   Subjective/Complaints: Had a pretty good night. Michela Pitcher he was trying to figure out if he was on the "[place] or [place] this morning"  ROS: Patient denies fever, rash, sore throat, blurred vision, nausea, vomiting, diarrhea, cough, shortness of breath or chest pain,  headache, or mood change.    Objective:   No results found. Recent Labs    02/19/21 0500  WBC 6.1  HGB 12.5*  HCT 36.3*  PLT 247    Recent Labs    02/19/21 0500  NA 140  K 3.9  CL 108  CO2 25  GLUCOSE 102*  BUN 29*  CREATININE 1.25*  CALCIUM 9.1     Intake/Output Summary (Last 24 hours) at 02/19/2021 1000 Last data filed at 02/19/2021 0700 Gross per 24 hour  Intake 480 ml  Output --  Net 480 ml        Physical Exam: Vital Signs Blood pressure (!) 134/58, pulse 82, temperature 98.3 F (36.8 C), resp. rate 16, height 6' 0.01" (1.829 m), weight 77.6 kg, SpO2 97 %.    Constitutional: No distress . Vital signs reviewed. HEENT: vision limited, oral membranes moist Neck: supple Cardiovascular: RRR without murmur. No JVD    Respiratory/Chest: CTA Bilaterally without wheezes or rales. Normal effort    GI/Abdomen: BS +, non-tender, non-distended Ext: no clubbing, cyanosis, or edema Psych: pleasant and cooperative  Skin: warm, wounds and bruises healing Musc: No edema in extremities.  No tenderness in extremities. Neuro: Alert and oriented to self, hospital and reason he was here with extra time, STM deficits. struggles with organizational tasks.  Motor: Grossly 4+/5 throughout, unchanged. No focal sensory deficits  Assessment/Plan: 1. Functional deficits which require 3+ hours per day of interdisciplinary therapy in a comprehensive inpatient rehab setting. Physiatrist is providing close team supervision and 24 hour management of active medical problems listed below. Physiatrist and rehab team continue to assess barriers  to discharge/monitor patient progress toward functional and medical goals  Care Tool:  Bathing    Body parts bathed by patient: Right arm, Chest, Left arm, Abdomen, Front perineal area, Right upper leg, Left upper leg, Buttocks, Face, Right lower leg, Left lower leg   Body parts bathed by helper: Right lower leg, Left lower leg     Bathing assist Assist Level: Contact Guard/Touching assist     Upper Body Dressing/Undressing Upper body dressing   What is the patient wearing?: Pull over shirt    Upper body assist Assist Level: Minimal Assistance - Patient > 75%    Lower Body Dressing/Undressing Lower body dressing      What is the patient wearing?: Pants, Incontinence brief     Lower body assist Assist for lower body dressing: Minimal Assistance - Patient > 75%     Toileting Toileting Toileting Activity did not occur (Clothing management and hygiene only): N/A (no void or bm)  Toileting assist Assist for toileting: Contact Guard/Touching assist     Transfers Chair/bed transfer  Transfers assist  Chair/bed transfer activity did not occur: Safety/medical concerns (Pt unable to maintian alertness)  Chair/bed transfer assist level: Contact Guard/Touching assist     Locomotion Ambulation  Ambulation assist      Assist level: Contact Guard/Touching assist Assistive device: Walker-rolling Max distance: 530ft   Walk 10 feet activity   Assist     Assist level: Contact Guard/Touching assist Assistive device: Walker-rolling   Walk 50 feet activity   Assist    Assist level: Contact Guard/Touching assist Assistive device: Walker-rolling    Walk 150 feet activity   Assist Walk 150 feet activity did not occur: Safety/medical concerns  Assist level: Contact Guard/Touching assist Assistive device: Walker-rolling    Walk 10 feet on uneven surface  activity   Assist     Assist level: Minimal Assistance - Patient > 75% Assistive device: Hand held  assist, Other (comment) (and use of handrail)   Wheelchair     Assist Is the patient using a wheelchair?: Yes Type of Wheelchair: Manual    Wheelchair assist level: Total Assistance - Patient < 25% Max wheelchair distance: 150    Wheelchair 50 feet with 2 turns activity    Assist        Assist Level: Total Assistance - Patient < 25%   Wheelchair 150 feet activity     Assist      Assist Level: Total Assistance - Patient < 25%   Blood pressure (!) 134/58, pulse 82, temperature 98.3 F (36.8 C), resp. rate 16, height 6' 0.01" (1.829 m), weight 77.6 kg, SpO2 97 %.  Medical Problem List and Plan: 1.  TBI/concussion secondary to fall 02/03/2021.  Cranial CT scan negative.  -Continue CIR therapies including PT, OT, and SLP   2.  Antithrombotics: -DVT/anticoagulation:   Eliquis             -antiplatelet therapy: N/A 3. Pain Management: Lidoderm patch, Robaxin as needed, Ultram as needed             Monitor with increased exertion  10/19-pain controlled   4. Mood/sleep/mental status:               -antipsychotic agents: seroquel at HS with PRN dose  -sleep chart   -reduce stimuli at night (TV)/behavioral plan  -continue ritalin to help with arousal, attention and focus during the day  -Staph Epi UTI: treated  Tele-sitter for safety 5. Neuropsych: This patient is not yet capable of making decisions on his own behalf.  10/18 improving 6. Skin/Wound Care: Left facial laceration or abrasion.  Sutures removed.  Routine skin checks 7. Fluids/Electrolytes/Nutrition: BUN/Cr up to 29/1.25 today             -encourage PO's  - protein supp for low albumin  - no offending meds  -add HS 1/2ns IVF 8.  Multiple left rib fractures with extrapleural hematoma.  Conservative management 9.  Density right middle lung lobe.  Incidental on CT.  Follow-up outpatient 10.  History of DVT/pulmonary emboli.  Eliquis 11.  Essential hypertension: Tenormin 50 mg daily  10/20 bp  controlled 12.  BPH.  Flomax 0.4 mg daily.  voiding and continent x 24 hours 13.  Hyperlipidemia.  Pravachol 14.  Hypothyroidism.  Synthroid 15.  Drug-induced constipation.  MiraLAX daily, Colace 100 mg twice daily             last bm 10/18 16.  Transaminitis  ALT mildly elevated on 10/13, normal 10/20  LOS: 8 days A FACE TO [place] 02/19/2021, 10:00 AM

## 2021-02-19 NOTE — Progress Notes (Signed)
Occupational Therapy Weekly Progress Note  Patient Details  Name: SUMEDH SHINSATO MRN: [id number] Date of Birth: 21-Dec-1931  Beginning of progress report period: February 12, 2022 End of progress report period: February 19, 2022  Patient has met 3 of 3 short term goals.  Pt is progressing well towards OT goals. He is at an overall CGA for functional BADL tasks but he still needs moderate cuing for safety awareness. Pt continues to have confusion and is often disoriented to time, place, and situation.   Patient continues to demonstrate the following deficits: muscle weakness, decreased initiation, decreased attention, decreased awareness, decreased problem solving, decreased safety awareness, decreased memory, and delayed processing, and decreased standing balance and decreased balance strategies and therefore will continue to benefit from skilled OT intervention to enhance overall performance with BADL and Reduce care partner burden.  Patient progressing toward long term goals..  Continue plan of care.  OT Short Term Goals Week 1:  OT Short Term Goal 1 (Week 1): Patient will complete UB dressing with Min A OT Short Term Goal 1 - Progress (Week 1): Met OT Short Term Goal 2 (Week 1): Patient will ambulate to the bathroom with min A OT Short Term Goal 2 - Progress (Week 1): Met OT Short Term Goal 3 (Week 1): Patient will sequence 2 steps of grooming task with min questioning cues OT Short Term Goal 3 - Progress (Week 1): Met Week 2:  OT Short Term Goal 1 (Week 2): LTG=STG 2/2 ELOS  Therapy Documentation Precautions:  Precautions Precautions: Fall Restrictions Weight Bearing Restrictions: No Pain: Pain Assessment Pain Scale: 0-10 Pain Score: 0-No pain    Therapy/Group: Individual Therapy  Valma Cava 02/19/2021, 12:51 PM

## 2021-02-19 NOTE — Progress Notes (Signed)
Occupational Therapy TBI Note  Patient Details  Name: Brandon Burke MRN: [id number] Date of Birth: May 18, 1931  Today's Date: 02/19/2021 OT Individual Time: 6195-0932 OT Individual Time Calculation (min): 72 min    Short Term Goals: Week 1:  OT Short Term Goal 1 (Week 1): Patient will complete UB dressing with Min A OT Short Term Goal 2 (Week 1): Patient will ambulate to the bathroom with min A OT Short Term Goal 3 (Week 1): Patient will sequence 2 steps of grooming task with min questioning cues  Skilled Therapeutic Interventions/Progress Updates:     Pt seen this date for OT encounter, pt was in bed upon arrival and indicated that he had soiled himself I introduced myself and indicated that  I was here for his OT tx session, I inquired as to whether the pt would like to work on BADL related task with focus on bathing, dressing, and grooming with safety awareness as a key component.  The pt indicated that he welcomed the opportunity to improve his functional status for greater independence in the indicated areas of function.   The pt also indicated that he rested well, but that he had awaken somewhat confused this A.M. in relation to the time of day and his general orientation. The pt had no report of pain at the time of this tx session.  The pt was able to come from supine to EOB with S and initial verbal prompt for safe interaction. He was MaxA with functional mobility at w/c LOF, the pt required S for the completion of toilet t/f, he was CGA for coming from sit to stand for washing his bottom, he was CGA for bathing the remainder of himself.  The pt was CGA for the performance of UB/LB dressing, he was able to donn his socks with s/u and constant vc's for safe task performance. The pt ambulated from his room to the gym incorporating a RW for balance with ModI and 2verbal prompts for remaining in close proximity to the walker, he was able to demonstrate fair+ functional mobility from his  room to the gym with a distance of greater than 123ft.  The pt t/f from standing to seated in an upright chair w/o arm support for a cognitive task to improve upon executive function for replication of a pattern, perception, eye hand coordination and memory.   The pt returned from the gym to his room at w/c  LOF, he returned to recliner with CGA with his safety alarm in place. Prior to exiting, I asked the pt if  he needed to go to the restroom, or needed water or assistance in other areas of function to address his level of comfort, the pt indicated that he was comfortable.   Therapy Documentation Precautions:  Precautions Precautions: Fall Restrictions Weight Bearing Restrictions: No Pain: Pain Assessment Pain Scale: 0-10 Pain Score: 0-No pain Agitated Behavior Scale: TBI Observation Details Observation Environment: pt's room Start of observation period - Date: 02/19/21 Start of observation period - Time: 1218 End of observation period - Date: 02/19/21 End of observation period - Time: 1219 Agitated Behavior Scale (DO NOT LEAVE BLANKS) Short attention span, easy distractibility, inability to concentrate: Present to a slight degree Impulsive, impatient, low tolerance for pain or frustration: Absent Uncooperative, resistant to care, demanding: Absent Violent and/or threatening violence toward people or property: Absent Explosive and/or unpredictable anger: Absent Rocking, rubbing, moaning, or other self-stimulating behavior: Absent Pulling at tubes, restraints, etc.: Absent Wandering from treatment areas: Absent  Restlessness, pacing, excessive movement: Absent Repetitive behaviors, motor, and/or verbal: Absent Rapid, loud, or excessive talking: Absent Sudden changes of mood: Absent Easily initiated or excessive crying and/or laughter: Absent Self-abusiveness, physical and/or verbal: Absent Agitated behavior scale total score: 15   Therapy/Group: Individual Therapy  Yvonne Kendall 02/19/2021, 12:24 PM

## 2021-02-19 NOTE — Progress Notes (Signed)
Physical Therapy Weekly Progress Note  Patient Details  Name: Brandon Burke MRN: [id number] Date of Birth: 01-20-1932  Beginning of progress report period: February 12, 2021 End of progress report period: February 19, 2021  Today's Date: 02/19/2021 PT Individual Time: 1400-1515 PT Individual Time Calculation (min): 75 min   Patient has met 3 of 4 short term goals. Pt is progressing well toward mobility goals, improving independence with bed mobility, balance, functional transfers, and ambulation. Pt performing all mobility typically at CGA level but at times still requiring minA due to unsteadiness and poor safety awareness. Pt has fluctuating levels of confusion and lethargy, affecting mobility. Pt will benefit from formal family education prior to DC.  Patient continues to demonstrate the following deficits muscle weakness, decreased cardiorespiratoy endurance, decreased awareness, decreased problem solving, decreased safety awareness, and decreased memory, and decreased sitting balance, decreased standing balance, decreased postural control, hemiplegia, and decreased balance strategies and therefore will continue to benefit from skilled PT intervention to increase functional independence with mobility.  Patient progressing toward long term goals..  Continue plan of care.  PT Short Term Goals Week 1:  PT Short Term Goal 1 (Week 1): Pt will perform supine to/from sit w/min assist PT Short Term Goal 1 - Progress (Week 1): Met PT Short Term Goal 2 (Week 1): Pt will perform basic transfers w/cga and minimal cueing for safety PT Short Term Goal 2 - Progress (Week 1): Met PT Short Term Goal 3 (Week 1): Pt will ambulate 119f w/cga to min assist in environment w/minimal to no distractions and no AD PT Short Term Goal 3 - Progress (Week 1): Met PT Short Term Goal 4 (Week 1): Pt will ambulat >1571fw/LRAD in busy environment including turns w/cga PT Short Term Goal 4 - Progress (Week 1):  Progressing toward goal Week 2:  PT Short Term Goal 1 (Week 2): STGs = LTGs  Skilled Therapeutic Interventions/Progress Updates:      Pt received seated in recliner and agrees to therapy. No complaint pain. [facility] and cues for sequencing and positioning. WC transport to gym for time management. Pt performs stand step transfer to mat table with close supervision and cues for sequencing. Pt tasked with ambulating over obstacle course with multiple barriers to step over. Pt completes x2 trials through course and has x1 LOB requiring minA for safety. Pt then given "multi-step" instructions, "Step over cones and pick up tissue box and bring it back." Pt remembers first step bur requires cueing to remember to pick up tissue box. On 2nd trial pt is able to complete without cueing required. Pt then tasked with finding "checkerboard". Pt ambulates x150' with CGA, turning head in both directions to look for object. Performed to give pt dynamic balancing task incorporating multitasking and short term memory. Pt does not require verbal cueing to remember task. Pt then tasked with finding "treadmill". Pt ambulates x150' back to dayroom. When turning body to look for treadmill pt has complete LOB requiring maxA to prevent fall. Following seated rest break, pt performs Nustep x12:00 at workload of 5, for strength and endurance training, as well as reciprocal coordination.   Pt ambulates in crowded environment, forced to avoid obstacles and people, with cues to maintain upright gaze to improve posture and balance. No LOBs noted. Pt also completes x4 6" steps with bilateral hand rails and CGA for safety.  PT explains TUG test rationale and performance and pt performs with following  times: 13.0 seconds, 12.5 seconds, and 11.0 seconds. During first two trials pt has near LOB when turning to the R, rocking backward and with large increase in postural sway. PT provides feedback on pt  performance and pt improves stability on final trial.  Pt ambulates back to room with RW and close supervision, x300'. Pt ambulates quickly and requires frequent verbal cues to increase proximity to RW for safety. Pt left seated in recliner with alarm intact and all needs within reach.   Therapy Documentation Precautions:  Precautions Precautions: Fall Restrictions Weight Bearing Restrictions: No   Therapy/Group: Individual Therapy  Breck Coons, PT, DPT 02/19/2021, 3:47 PM

## 2021-02-19 NOTE — Progress Notes (Signed)
Pt was on apixaban 2.5mg  BID prior to admission d/t a hx of recurrent DVTs. He is just on the lifelong dose at this point per his primary care MD. D/w Marlowe [place], PA.  Onnie Boer, PharmD, BCIDP, AAHIVP, CPP Infectious Disease Pharmacist 02/19/2021 11:32 AM

## 2021-02-20 LAB — BASIC METABOLIC PANEL
Anion gap: 4 — ABNORMAL LOW (ref 5–15)
BUN: 20 mg/dL (ref 8–23)
CO2: 24 mmol/L (ref 22–32)
Calcium: 8.6 mg/dL — ABNORMAL LOW (ref 8.9–10.3)
Chloride: 111 mmol/L (ref 98–111)
Creatinine, Ser: 1.07 mg/dL (ref 0.61–1.24)
GFR, Estimated: >60 mL/min (ref >60)
Glucose, Bld: 94 mg/dL (ref 70–99)
Potassium: 3.6 mmol/L (ref 3.5–5.1)
Sodium: 139 mmol/L (ref 135–145)

## 2021-02-20 LAB — GLUCOSE, CAPILLARY: Glucose-Capillary: 102 mg/dL — ABNORMAL HIGH (ref 70–99)

## 2021-02-20 MED ORDER — DM-GUAIFENESIN ER 30-600 MG PO TB12: 1.0000 | ORAL_TABLET | Freq: Two times a day (BID) | ORAL | Status: DC | PRN

## 2021-02-20 NOTE — Progress Notes (Signed)
Physical Therapy Session Note  Patient Details  Name: Brandon Burke MRN: [id number] Date of Birth: 16-Aug-1931  Today's Date: 02/20/2021 PT Individual Time: 1500-1530 PT Individual Time Calculation (min): 30 min   Short Term Goals: Week 1:  PT Short Term Goal 1 (Week 1): Pt will perform supine to/from sit w/min assist PT Short Term Goal 1 - Progress (Week 1): Met PT Short Term Goal 2 (Week 1): Pt will perform basic transfers w/cga and minimal cueing for safety PT Short Term Goal 2 - Progress (Week 1): Met PT Short Term Goal 3 (Week 1): Pt will ambulate 130f w/cga to min assist in environment w/minimal to no distractions and no AD PT Short Term Goal 3 - Progress (Week 1): Met PT Short Term Goal 4 (Week 1): Pt will ambulat >1574fw/LRAD in busy environment including turns w/cga PT Short Term Goal 4 - Progress (Week 1): Progressing toward goal  Skilled Therapeutic Interventions/Progress Updates: Pt presents sitting in w/c and agreeable to therapy.  Pt transfers sit to stand w/ CGA and then requested to use BR.  Pt amb w/ CGA to BR and then requested CGA for toilet transfer.  Pt continent for BM.  NT notified and will chart.  Pt supervision for pericare.  Pt amb to sink and performed handwashing w/ supervision.  Pt amb > 200' , 120', 120', and >200' w/o AD and CGA.  Pt requires verbal and tactile cueing for directions.  Pt negotiated ramp and mulch pit w/ CGA .  Pt returned to room and remained sitting in w/c w/ chair alarm on and all needs in reach.     Therapy Documentation Precautions:  Precautions Precautions: Fall Restrictions Weight Bearing Restrictions: No General:   Vital Signs: Therapy Vitals Temp: 98.4 F (36.9 C) Temp Source: Oral Pulse Rate: 81 Resp: 18 BP: (!) 127/52 Patient Position (if appropriate): Sitting Oxygen Therapy SpO2: 100 % O2 Device: Room Air Pain:0/10      Therapy/Group: Individual Therapy  [name]0/21/2022, 3:52 PM

## 2021-02-20 NOTE — Progress Notes (Signed)
Occupational Therapy TBI Note  Patient Details  Name: Brandon Burke MRN: [id number] Date of Birth: December 05, 1931  Today's Date: 02/20/2021 OT Individual Time: 3235-5732 OT Individual Time Calculation (min): 57 min   Today's Date: 02/20/2021 OT Individual Time: 2025-4270 OT Individual Time Calculation (min): 38 min   Short Term Goals: Week 1:  OT Short Term Goal 1 (Week 1): Patient will complete UB dressing with Min A OT Short Term Goal 1 - Progress (Week 1): Met OT Short Term Goal 2 (Week 1): Patient will ambulate to the bathroom with min A OT Short Term Goal 2 - Progress (Week 1): Met OT Short Term Goal 3 (Week 1): Patient will sequence 2 steps of grooming task with min questioning cues OT Short Term Goal 3 - Progress (Week 1): Met  Skilled Therapeutic Interventions/Progress Updates:     Pt received in w/c with no pain  ADL: Pt completes ADL at sink for grooming only with VC for locating items at back of sink d/t novel products (toothpaste) in use. Pt able to sequence grooming appropriately. Pt declining bathing and dressing reporting already completed despite NT shaking head no and only speech therapy prior. Will attempt in afternoon session. Pt shaves with electric razor with supervision, but unable to terminate task while OT carrying conversation demo poor selective attenion. Pt applies post shave product appropriately. Shoes/socks donned with S for cuing to put correct shoe on correct foot/notice error  Therapeutic activity Standing horseshoe toss for dynamic balance reaching MOD ranges outside BOS and retrieval of items with reacher with MIN A. VC for proxitimity to item when retrieving and turning around to walk to chair instead of walking backwards.  Standing pipe tree activitiy with pt unable to complete simple figure (cross shape) with extra pieces. Pt able to complete field goal shape with the correct pieces laid out on the table with 1 cue to initiate. With extra pieces  on the table pt requires MOD cuing for completing minimally complex shape. Pt consistently demo difficulty with depth perception overreaching  Pt left at end of session in recliner with exit alarm on, call light in reach and all needs met  Session 2:  Pt received in recliner with no pain initially.   ADL: Pt received in recliner needing to toilet. Pt completes ambulatory transfer to bathroom with CGA overall and VC for keeping hands on walker insteady of pushing the door open. Pt able to void bladder on toilet. Pt completes dressing sit to stand from shower chair with pt audibly winded but able to rest with cuing. Pt doffs shirt in standing with MIN A for posterior lean and dons in sitting with cuing. Pt requires MAX cuing for doffing/donning pants over feet in sitting and requires eventually direct VC for orientation of pants to self. MIN A to don overall. Pt returs to bed reporting R shoulder pain and L low back pain. Heat pack provided and family educated on heat removal after 20 min and PT to be in for session in time frame. Pt and family provided brain injury booklet with edu re rancho levels and orientation to booklet. No further questions.   Pt left at end of session in bed with exit alarm on, call light in reach and all needs met    Therapy Documentation Precautions:  Precautions Precautions: Fall Restrictions Weight Bearing Restrictions: No General:   Agitated Behavior Scale: TBI  Observation Details Observation Environment: patient room Start of observation period - Date: 02/20/21 Start of observation  period - Time: 1307 End of observation period - Date: 02/20/21 End of observation period - Time: 1345 Agitated Behavior Scale (DO NOT LEAVE BLANKS) Short attention span, easy distractibility, inability to concentrate: Present to a slight degree Impulsive, impatient, low tolerance for pain or frustration: Absent Uncooperative, resistant to care, demanding: Absent Violent  and/or threatening violence toward people or property: Absent Explosive and/or unpredictable anger: Absent Rocking, rubbing, moaning, or other self-stimulating behavior: Absent Pulling at tubes, restraints, etc.: Absent Wandering from treatment areas: Absent Restlessness, pacing, excessive movement: Absent Repetitive behaviors, motor, and/or verbal: Absent Rapid, loud, or excessive talking: Absent Sudden changes of mood: Absent Easily initiated or excessive crying and/or laughter: Absent Self-abusiveness, physical and/or verbal: Absent Agitated behavior scale total score: 15    Therapy/Group: Individual Therapy  Tonny Branch 02/20/2021, 6:40 AM

## 2021-02-20 NOTE — Plan of Care (Signed)
  Problem: RH Memory Goal: LTG Patient will demonstrate ability for day to day (SLP) Description: LTG:   Patient will demonstrate ability for day to day recall/carryover during cognitive/linguistic activities with assist  (SLP) Outcome: Not Applicable Note: Goal discharged-no longer appropriate for level of impairment    Problem: RH Problem Solving Goal: LTG Patient will demonstrate problem solving for (SLP) Description: LTG:  Patient will demonstrate problem solving for basic/complex daily situations with cues  (SLP) Flowsheets (Taken 02/20/2021 0626) LTG Patient will demonstrate problem solving for: Minimal Assistance - Patient > 75% Note: Goal downgraded due to slow progress and fluctuation in overall cognitive functioning    Problem: RH Memory Goal: LTG Patient will use memory compensatory aids to (SLP) Description: LTG:  Patient will use memory compensatory aids to recall biographical/new, daily complex information with cues (SLP) Flowsheets (Taken 02/20/2021 0626) LTG: Patient will use memory compensatory aids to (SLP): Minimal Assistance - Patient > 75% Note: Goal downgraded due to slow progress and fluctuation in overall cognitive functioning    Problem: RH Awareness Goal: LTG: Patient will demonstrate awareness during functional activites type of (SLP) Description: LTG: Patient will demonstrate awareness during functional activites type of (SLP) Flowsheets (Taken 02/20/2021 0626) LTG: Patient will demonstrate awareness during cognitive/linguistic activities with assistance of (SLP): Moderate Assistance - Patient 50 - 74% Note: Goal downgraded due to slow progress and fluctuation in overall cognitive functioning

## 2021-02-20 NOTE — Progress Notes (Signed)
Patient is very confused and forgetful at night. Could not remember that he spoke to wife over the phone at 1945h and is suddenly not aware he is in the hospital, what time it is and why he is here. Had to be reminded several times. Tele sitter maintained. Reoriented to place time and situation. Given PRN seroquel earlier last night with little relief from insomnia and pain meds for verbalized arm pain. Sleep chart updated.

## 2021-02-20 NOTE — Progress Notes (Signed)
Speech Language Pathology Weekly Progress and Session Note  Patient Details  Name: PEDER ALLUMS MRN: [id number] Date of Birth: 10/19/1931  Beginning of progress report period: February 12, 2021 End of progress report period: February 20, 2021  Today's Date: 02/20/2021 SLP Individual Time: 0815-0900 SLP Individual Time Calculation (min): 45 min  Short Term Goals: Week 1: SLP Short Term Goal 1 (Week 1): Patient will utilize external memory aids for orientation X 4 with Min verbal and visual cues. SLP Short Term Goal 1 - Progress (Week 1): Not met SLP Short Term Goal 2 (Week 1): Patient will demonstrate functional problem solving for basic and familiar tasks with Min verbal cues. SLP Short Term Goal 2 - Progress (Week 1): Not met SLP Short Term Goal 3 (Week 1): Patient will demosntrate sustained attention to functional tasks for 15 minutes with Min verbal cues for redirection. SLP Short Term Goal 3 - Progress (Week 1): Met    New Short Term Goals: Week 2: SLP Short Term Goal 1 (Week 2): STGs=LTGs due to ELOS  Weekly Progress Updates: Patient has made minimal and inconsistent gains this reporting period. Patient's overall cognitive functioning tends to fluctuate bur overall, patient demonstrates behaviors consistent with a Rancho Level VI and requires overall Mod-Max A multimodal cues to complete functional and familiar tasks safely in regards to orientation with use of external aids, problem solving and attention. Patient and family education. Patient would benefit from continued skilled SLP intervention to maximize his cognitive functioning and overall functional independence prior to discharge.      Intensity: Minumum of 1-2 x/day, 30 to 90 minutes Frequency: 3 to 5 out of 7 days Duration/Length of Stay: 02/28/21 Treatment/Interventions: Cognitive remediation/compensation;Internal/external aids;Therapeutic Activities;Environmental controls;Cueing hierarchy;Functional  tasks;Patient/family education   Daily Session  Skilled Therapeutic Interventions:  Skilled treatment session focused on cognitive goals. Upon arrival, patient was awake and intermittently confused throughout the session but oriented X4 with extra time and use of visual aids. Patient agreeable to participate in treatment session and was transferred to the wheelchair with Min verbal cues for safety. Patient completed basic self-care tasks at the sink with supervision verbal cues for problem solving and attention to task. Patient also participated in a problem solving task.with Mod verbal cues to identify the problem and generate an appropriate solution. Patient handed off to NT. Continue with current plan of care.     Pain No/Denies Pain   Therapy/Group: Individual Therapy  Alanmichael Barmore 02/20/2021, 6:36 AM

## 2021-02-20 NOTE — Progress Notes (Addendum)
Physical Therapy Session Note  Patient Details  Name: Brandon Burke MRN: [id number] Date of Birth: 08-Jul-1931  Today's Date: 02/20/2021 PT Individual Time: 1500-1530 PT Individual Time Calculation (min): 30 min   Short Term Goals: Week 1:  PT Short Term Goal 1 (Week 1): Pt will perform supine to/from sit w/min assist PT Short Term Goal 1 - Progress (Week 1): Met PT Short Term Goal 2 (Week 1): Pt will perform basic transfers w/cga and minimal cueing for safety PT Short Term Goal 2 - Progress (Week 1): Met PT Short Term Goal 3 (Week 1): Pt will ambulate 161f w/cga to min assist in environment w/minimal to no distractions and no AD PT Short Term Goal 3 - Progress (Week 1): Met PT Short Term Goal 4 (Week 1): Pt will ambulat >1558fw/LRAD in busy environment including turns w/cga PT Short Term Goal 4 - Progress (Week 1): Progressing toward goal Week 2:  PT Short Term Goal 1 (Week 2): STGs = LTGs Week 3:     Skilled Therapeutic Interventions/Progress Updates:   Pt initially resting supine but agreeable to session.  Supine to sit w/rail and supervision.  Dons shoes w/supervision.  Sit to stand and stand pivot transfer to wc w/cga to light min assist, no AD. Pt transported to hallway.   Gait >45035f/cga, able to add head turns and nods w/slowing of gait but no lob.  Dual task: Gait >450f38file attempting simple single digit addition and multiplication - pt required min assist for balance, mult occasions of balance loss, significantly slowed cadence, high error rate w/problems, repeats "I don't understand why it won't work" (ability to solve problems). At end of session, pt transported to room.  Pt able to relay to family activities of session.  Pt left oob in wc w/alarm belt set and needs in reach   Therapy Documentation Precautions:  Precautions Precautions: Fall Restrictions Weight Bearing Restrictions: No   Therapy/Group: Individual Therapy BarbCallie FieldingT  [place]21/2022, 4:30 PM

## 2021-02-20 NOTE — Progress Notes (Signed)
PROGRESS NOTE   Subjective/Complaints: Seems to have had another reasonable night. Denies any pain.   ROS: Patient denies fever, rash, sore throat, blurred vision, nausea, vomiting, diarrhea, cough, shortness of breath or chest pain, joint or back pain, headache, or mood change.     Objective:   No results found. Recent Labs    02/19/21 0500  WBC 6.1  HGB 12.5*  HCT 36.3*  PLT 247    Recent Labs    02/19/21 0500 02/20/21 0505  NA 140 139  K 3.9 3.6  CL 108 111  CO2 25 24  GLUCOSE 102* 94  BUN 29* 20  CREATININE 1.25* 1.07  CALCIUM 9.1 8.6*     Intake/Output Summary (Last 24 hours) at 02/20/2021 1004 Last data filed at 02/20/2021 0700 Gross per 24 hour  Intake 600 ml  Output 300 ml  Net 300 ml        Physical Exam: Vital Signs Blood pressure (!) 124/52, pulse 67, temperature 98.3 F (36.8 C), temperature source (P) Oral, resp. rate 17, height 6' 0.01" (1.829 m), weight 77.6 kg, SpO2 96 %.    Constitutional: No distress . Vital signs reviewed. HEENT: NCAT, blind left eye, limited vision right , oral membranes moist Neck: supple Cardiovascular: RRR without murmur. No JVD    Respiratory/Chest: CTA Bilaterally without wheezes or rales. Normal effort    GI/Abdomen: BS +, non-tender, non-distended Ext: no clubbing, cyanosis, or edema Psych: pleasant and cooperative  Skin: warm, wounds and bruises healing Musc: No edema in extremities.  No tenderness in extremities. Neuro: Alert and oriented to self, hospital and reason he was here today, some cueing.  STM deficits. Processing delays. Good sitting balance Motor: Grossly 4+/5 throughout, unchanged. No focal sensory deficits  Assessment/Plan: 1. Functional deficits which require 3+ hours per day of interdisciplinary therapy in a comprehensive inpatient rehab setting. Physiatrist is providing close team supervision and 24 hour management of active  medical problems listed below. Physiatrist and rehab team continue to assess barriers to discharge/monitor patient progress toward functional and medical goals  Care Tool:  Bathing    Body parts bathed by patient: Right arm, Left arm, Chest, Abdomen, Front perineal area, Buttocks, Right upper leg, Left upper leg, Right lower leg, Left lower leg, Face   Body parts bathed by helper: Right lower leg, Left lower leg     Bathing assist Assist Level: Contact Guard/Touching assist     Upper Body Dressing/Undressing Upper body dressing   What is the patient wearing?: Pull over shirt    Upper body assist Assist Level: Supervision/Verbal cueing    Lower Body Dressing/Undressing Lower body dressing      What is the patient wearing?: Pants     Lower body assist Assist for lower body dressing: Contact Guard/Touching assist     Toileting Toileting Toileting Activity did not occur (Clothing management and hygiene only): N/A (no void or bm)  Toileting assist Assist for toileting: Supervision/Verbal cueing     Transfers Chair/bed transfer  Transfers assist  Chair/bed transfer activity did not occur: Safety/medical concerns (Pt unable to maintian alertness)  Chair/bed transfer assist level: Contact Guard/Touching assist     Locomotion  Ambulation   Ambulation assist      Assist level: Contact Guard/Touching assist Assistive device: Walker-rolling Max distance: 527ft   Walk 10 feet activity   Assist     Assist level: Contact Guard/Touching assist Assistive device: Walker-rolling   Walk 50 feet activity   Assist    Assist level: Contact Guard/Touching assist Assistive device: Walker-rolling    Walk 150 feet activity   Assist Walk 150 feet activity did not occur: Safety/medical concerns  Assist level: Contact Guard/Touching assist Assistive device: Walker-rolling    Walk 10 feet on uneven surface  activity   Assist     Assist level: Minimal  Assistance - Patient > 75% Assistive device: Hand held assist, Other (comment) (and use of handrail)   Wheelchair     Assist Is the patient using a wheelchair?: Yes Type of Wheelchair: Manual    Wheelchair assist level: Total Assistance - Patient < 25% Max wheelchair distance: 150    Wheelchair 50 feet with 2 turns activity    Assist        Assist Level: Total Assistance - Patient < 25%   Wheelchair 150 feet activity     Assist      Assist Level: Total Assistance - Patient < 25%   Blood pressure (!) 124/52, pulse 67, temperature 98.3 F (36.8 C), temperature source (P) Oral, resp. rate 17, height 6' 0.01" (1.829 m), weight 77.6 kg, SpO2 96 %.  Medical Problem List and Plan: 1.  TBI/concussion secondary to fall 02/03/2021.  Cranial CT scan negative.  -Continue CIR therapies including PT, OT, and SLP    2.  Antithrombotics: -DVT/anticoagulation:   Eliquis             -antiplatelet therapy: N/A 3. Pain Management: Lidoderm patch, Robaxin as needed, Ultram as needed             Monitor with increased exertion  10/21-pain controlled   4. Mood/sleep/mental status:               -antipsychotic agents: seroquel at HS with PRN dose  -maintain sleep chart   -reduce stimuli at night (TV)/behavioral plan  -continue ritalin to help with arousal, attention and focus during the day  -Staph Epi UTI: treated  Tele-sitter for safety 5. Neuropsych: This patient is not yet capable of making decisions on his own behalf.  10/18 improving 6. Skin/Wound Care: Left facial laceration or abrasion.  Sutures removed.  Routine skin checks 7. Fluids/Electrolytes/Nutrition: BUN/Cr up to 29/1.25 today             -encourage PO's  - protein supp for low albumin  - no offending meds  -added HS 1/2ns IVF 11/20--BUN/Cr improved 10/21   -continue IVF tonight then stop   -recheck labs Monday 8.  Multiple left rib fractures with extrapleural hematoma.  Conservative management 9.  Density  right middle lung lobe.  Incidental on CT.  Follow-up outpatient 10.  History of DVT/pulmonary emboli.  Eliquis 11.  Essential hypertension: Tenormin 50 mg daily  10/21 bp controlled 12.  BPH.  Flomax 0.4 mg daily.  voiding and continent x 24 hours 13.  Hyperlipidemia.  Pravachol 14.  Hypothyroidism.  Synthroid 15.  Drug-induced constipation.  MiraLAX daily, Colace 100 mg twice daily             last bm 10/21 16.  Transaminitis  ALT mildly elevated on 10/13, normal 10/20  LOS: 9 days A FACE TO FACE EVALUATION WAS PERFORMED  Alroy Dust T  Naaman Plummer 02/20/2021, 10:04 AM

## 2021-02-21 LAB — GLUCOSE, CAPILLARY: Glucose-Capillary: 144 mg/dL — ABNORMAL HIGH (ref 70–99)

## 2021-02-21 MED ORDER — QUETIAPINE FUMARATE 50 MG PO TABS
50.0000 mg | ORAL_TABLET | Freq: Every day | ORAL | Status: DC
  Administered 2021-02-21 – 2021-02-27 (×7): 50 mg via ORAL
  Filled 2021-02-21 (×7): qty 1

## 2021-02-21 NOTE — Progress Notes (Signed)
Physical Therapy TBI Note  Patient Details  Name: Brandon Burke MRN: [id number] Date of Birth: 1931/09/28  Today's Date: 02/21/2021 PT Individual Time: 0906-1005 PT Individual Time Calculation (min): 59 min   Short Term Goals: Week 2:  PT Short Term Goal 1 (Week 2): STGs = LTGs  Skilled Therapeutic Interventions/Progress Updates:    Pt received supine in bed asleep, easily awakens and agreeable to therapy session. Pt comforted by therapist's prescense reporting he remembers this therapist; however, this is our the first time working together. Pt states "I'm confused today" stating "I woke up and thought I was OK and then things got squirrely." Pt initially not able to state he is the hospital but when provided that information he is able to state hospital name and that he is here because "my head got injured." Pt perseverating on being confused this morning and that his brother is coming to visit. Gait in room, no AD, with light min assist/CGA for steadying to collect wash cloth and wash face at sink followed by oral care.   Gait training ~126ft to main therapy gym, no AD, with light min assist for balance while discussing pt's prior household roles including managing their finances - in one moment pt able to identify that he will not be able to do this once going home; however, states he will teach his wife how to do it (unaware that he likely will not be able to educate her).  Performed sustained attention task of completing simple 1 digit addition and subtraction math problems (ex: 8-2) while sitting and then progressed to dual-task with alternating/divided attention of ambulating while completing those same complexity level of math problems - just as in sitting, pt has most difficulty with subtraction problems - pt able to continue walking while trying to solve a math problem but does require significantly increased time to find the solution and sometimes he is unable to complete slightly  more difficulty subtraction questions until he sits down. Requires light min assist for balance throughout.  Dynamic gait training and dual-task via forward reciprocal stepping through agility ladder while counting up from 1 and continuing until reaching 33 steps - pt requires 2nd attempt before being able to count continuously as pt looses count at 11. Pt had 2x R LOB requiring mod assist for recovery when turning around but primarily requires min assist for balance.  Gait training ~162ft back to room, no AD, with min assist for balance as described above while engaged pt in navigating back to room, utilizing signs on the wall but pt unable to read room number becoming confused by the "4W at beginning of each one - requires mod verbal cuing.  Pt's wife present in the room. Pt left seated in recliner with needs in reach and seat belt alarm on - pt able to recall that he needs to call for assistance.    Therapy Documentation Precautions:  Precautions Precautions: Fall Restrictions Weight Bearing Restrictions: No   Pain:   Reports mid back pain upon coming to sit EOB - appears to improve with movement throughout session though demonstrates some possible discomfort during sit<>stand transfers but pt does not state any.   Agitated Behavior Scale: TBI Observation Details Observation Environment: CIR Start of observation period - Date: 02/21/21 Start of observation period - Time: 0906 End of observation period - Date: 02/21/21 End of observation period - Time: 1005  Agitated Behavior Scale (DO NOT LEAVE BLANKS) Short attention span, easy distractibility, inability to concentrate: Present  to a moderate degree Impulsive, impatient, low tolerance for pain or frustration: Absent Uncooperative, resistant to care, demanding: Absent Violent and/or threatening violence toward people or property: Absent Explosive and/or unpredictable anger: Absent Rocking, rubbing, moaning, or other  self-stimulating behavior: Absent Pulling at tubes, restraints, etc.: Absent Wandering from treatment areas: Absent Restlessness, pacing, excessive movement: Absent Repetitive behaviors, motor, and/or verbal: Absent Rapid, loud, or excessive talking: Present to a moderate degree Sudden changes of mood: Absent Easily initiated or excessive crying and/or laughter: Absent Self-abusiveness, physical and/or verbal: Absent Agitated behavior scale total score: 18    Therapy/Group: Individual Therapy  Tawana Scale , PT, DPT, NCS, CSRS  02/21/2021, 7:47 AM

## 2021-02-21 NOTE — Progress Notes (Signed)
Speech Language Pathology TBI Note  Patient Details  Name: Brandon Burke MRN: [id number] Date of Birth: 05/16/1931  Today's Date: 02/21/2021 SLP Individual Time: 1300-1345 SLP Individual Time Calculation (min): 45 min  Short Term Goals: Week 2: SLP Short Term Goal 1 (Week 2): STGs=LTGs due to ELOS  Skilled Therapeutic Interventions: Pt seen for skilled ST with focus on cognitive goals, pt wife and brother present throughout. Pt independently oriented to [facility], year and locations, min cues for DOW, date and upcoming holiday. Pt with a few episodes of perseveration on low vision and stories of retirement, however generally able to sustain attention to structured tasks with min A. Pt shown problem scenario cards, able to ID problematic situations with extra time and Min A, able to solve identified problems with Supervision A this date. Pt continues to have decreased awareness of cognitive impairments and impact on safety in home environment, will benefit from ongoing education and training for pt and family. Pt left in recliner with alarm belt present and family present for needs. Cont ST POC.  Pain Pain Assessment Pain Scale: 0-10 Pain Score: 0-No pain Pain Location: Back Pain Intervention(s): Medication (See eMAR)  Agitated Behavior Scale: TBI Observation Details Observation Environment: pt room Start of observation period - Date: 02/21/21 Start of observation period - Time: 1300 End of observation period - Date: 02/21/21 End of observation period - Time: 1345 Agitated Behavior Scale (DO NOT LEAVE BLANKS) Short attention span, easy distractibility, inability to concentrate: Present to a slight degree Impulsive, impatient, low tolerance for pain or frustration: Absent Uncooperative, resistant to care, demanding: Absent Violent and/or threatening violence toward people or property: Absent Explosive and/or unpredictable anger: Absent Rocking, rubbing, moaning, or other  self-stimulating behavior: Absent Pulling at tubes, restraints, etc.: Absent Wandering from treatment areas: Absent Restlessness, pacing, excessive movement: Absent Repetitive behaviors, motor, and/or verbal: Absent Rapid, loud, or excessive talking: Present to a slight degree Sudden changes of mood: Absent Easily initiated or excessive crying and/or laughter: Absent Self-abusiveness, physical and/or verbal: Absent Agitated behavior scale total score: 16  Therapy/Group: Individual Therapy  Dewaine Conger 02/21/2021, 1:46 PM

## 2021-02-21 NOTE — Progress Notes (Addendum)
PROGRESS NOTE   Subjective/Complaints: In restroom initially and I spoke with his wife who had concern about cognitive impairments and insomnia. Reviewed medications with her and we decided to increase Seroquel to 50mg  HS  ROS: Patient denies fever, rash, sore throat, blurred vision, nausea, vomiting, diarrhea, cough, shortness of breath or chest pain, joint or back pain, headache, or mood change.     Objective:   No results found. Recent Labs    02/19/21 0500  WBC 6.1  HGB 12.5*  HCT 36.3*  PLT 247    Recent Labs    02/19/21 0500 02/20/21 0505  NA 140 139  K 3.9 3.6  CL 108 111  CO2 25 24  GLUCOSE 102* 94  BUN 29* 20  CREATININE 1.25* 1.07  CALCIUM 9.1 8.6*     Intake/Output Summary (Last 24 hours) at 02/21/2021 1942 Last data filed at 02/21/2021 1822 Gross per 24 hour  Intake 600 ml  Output --  Net 600 ml        Physical Exam: Vital Signs Blood pressure (!) 145/73, pulse 83, temperature 97.9 F (36.6 C), temperature source Oral, resp. rate 17, height 6' 0.01" (1.829 m), weight 77.6 kg, SpO2 98 %. Gen: no distress, normal appearing HEENT: oral mucosa pink and moist, NCAT Cardio: Reg rate Chest: normal effort, normal rate of breathing Abd: soft, non-distended Ext: no edema Psych: pleasant, normal affect Skin: warm, wounds and bruises healing Musc: No edema in extremities.  No tenderness in extremities. Neuro: Alert and oriented to self, hospital and reason he was here today, some cueing.  STM deficits. Processing delays. Good sitting balance Motor: Grossly 4+/5 throughout, unchanged. No focal sensory deficits  Assessment/Plan: 1. Functional deficits which require 3+ hours per day of interdisciplinary therapy in a comprehensive inpatient rehab setting. Physiatrist is providing close team supervision and 24 hour management of active medical problems listed below. Physiatrist and rehab team continue  to assess barriers to discharge/monitor patient progress toward functional and medical goals  Care Tool:  Bathing    Body parts bathed by patient: Right arm, Left arm, Chest, Abdomen, Front perineal area, Buttocks, Right upper leg, Left upper leg, Right lower leg, Left lower leg, Face   Body parts bathed by helper: Right lower leg, Left lower leg     Bathing assist Assist Level: Contact Guard/Touching assist     Upper Body Dressing/Undressing Upper body dressing   What is the patient wearing?: Pull over shirt    Upper body assist Assist Level: Supervision/Verbal cueing    Lower Body Dressing/Undressing Lower body dressing      What is the patient wearing?: Pants     Lower body assist Assist for lower body dressing: Contact Guard/Touching assist     Toileting Toileting Toileting Activity did not occur (Clothing management and hygiene only): N/A (no void or bm)  Toileting assist Assist for toileting: Supervision/Verbal cueing     Transfers Chair/bed transfer  Transfers assist  Chair/bed transfer activity did not occur: Safety/medical concerns (Pt unable to maintian alertness)  Chair/bed transfer assist level: Minimal Assistance - Patient > 75%     Locomotion Ambulation   Ambulation assist  Assist level: Minimal Assistance - Patient > 75% Assistive device: No Device Max distance: 179ft   Walk 10 feet activity   Assist     Assist level: Contact Guard/Touching assist Assistive device: No Device   Walk 50 feet activity   Assist    Assist level: Contact Guard/Touching assist Assistive device: No Device    Walk 150 feet activity   Assist Walk 150 feet activity did not occur: Safety/medical concerns  Assist level: Contact Guard/Touching assist Assistive device: No Device    Walk 10 feet on uneven surface  activity   Assist     Assist level: Contact Guard/Touching assist Assistive device: Other (comment)    Wheelchair     Assist Is the patient using a wheelchair?: Yes Type of Wheelchair: Manual    Wheelchair assist level: Total Assistance - Patient < 25% Max wheelchair distance: 150    Wheelchair 50 feet with 2 turns activity    Assist        Assist Level: Total Assistance - Patient < 25%   Wheelchair 150 feet activity     Assist      Assist Level: Total Assistance - Patient < 25%   Blood pressure (!) 145/73, pulse 83, temperature 97.9 F (36.6 C), temperature source Oral, resp. rate 17, height 6' 0.01" (1.829 m), weight 77.6 kg, SpO2 98 %.  Medical Problem List and Plan: 1.  TBI/concussion secondary to fall 02/03/2021.  Cranial CT scan negative.  -Continue CIR therapies including PT, OT, and SLP   2.  Antithrombotics: -DVT/anticoagulation:   Eliquis             -antiplatelet therapy: N/A 3. Pain: continue Lidoderm patch, Robaxin as needed, Ultram as needed             Monitor with increased exertion 4. Insomnia: increased seroquel to 50mg . Discussed with wife that this can also help with agitation/sundowning and she is agreeable- patient has been calling her at night               -antipsychotic agents: seroquel at HS with PRN dose  -maintain sleep chart   -reduce stimuli at night (TV)/behavioral plan  -continue ritalin to help with arousal, attention and focus during the day  -Staph Epi UTI: treated  Tele-sitter for safety 5. Neuropsych: This patient is not yet capable of making decisions on his own behalf.  10/18 improving 6. Skin/Wound Care: Left facial laceration or abrasion.  Sutures removed.  Routine skin checks 7. Fluids/Electrolytes/Nutrition: BUN/Cr up to 29/1.25 today             -encourage PO's  - protein supp for low albumin  - no offending meds  -added HS 1/2ns IVF 11/20--BUN/Cr improved 10/21   -continue IVF tonight then stop   -recheck labs Monday 8.  Multiple left rib fractures with extrapleural hematoma.  Conservative management 9.   Density right middle lung lobe.  Incidental on CT.  Follow-up outpatient 10.  History of DVT/pulmonary emboli.  Eliquis 11.  Essential hypertension: Tenormin 50 mg daily  SBP elevated: continue to monitor,  12.  BPH.  Flomax 0.4 mg daily.  voiding and continent x 24 hours 13.  Hyperlipidemia.  Pravachol 14.  Hypothyroidism.  Synthroid 15.  Drug-induced constipation.  MiraLAX daily, Colace 100 mg twice daily             last bm 10/21 16.  Transaminitis  ALT mildly elevated on 10/13, normal 10/20   LOS: 10 days A FACE  TO FACE EVALUATION WAS PERFORMED  Izora Ribas 02/21/2021, 7:42 PM

## 2021-02-22 LAB — GLUCOSE, CAPILLARY
Glucose-Capillary: 104 mg/dL — ABNORMAL HIGH (ref 70–99)
Glucose-Capillary: 100 mg/dL — ABNORMAL HIGH (ref 70–99)
Glucose-Capillary: 136 mg/dL — ABNORMAL HIGH (ref 70–99)

## 2021-02-23 DIAGNOSIS — T1490XA Injury, unspecified, initial encounter: Secondary | ICD-10-CM

## 2021-02-23 DIAGNOSIS — T148XXA Other injury of unspecified body region, initial encounter: Secondary | ICD-10-CM

## 2021-02-23 DIAGNOSIS — S069X0S Unspecified intracranial injury without loss of consciousness, sequela: Secondary | ICD-10-CM

## 2021-02-23 LAB — BASIC METABOLIC PANEL
Anion gap: 5 (ref 5–15)
BUN: 23 mg/dL (ref 8–23)
CO2: 26 mmol/L (ref 22–32)
Calcium: 8.9 mg/dL (ref 8.9–10.3)
Chloride: 108 mmol/L (ref 98–111)
Creatinine, Ser: 1.1 mg/dL (ref 0.61–1.24)
GFR, Estimated: >60 mL/min (ref >60)
Glucose, Bld: 96 mg/dL (ref 70–99)
Potassium: 3.8 mmol/L (ref 3.5–5.1)
Sodium: 139 mmol/L (ref 135–145)

## 2021-02-23 LAB — CBC
HCT: 37.1 % — ABNORMAL LOW (ref 39.0–52.0)
Hemoglobin: 12.7 g/dL — ABNORMAL LOW (ref 13.0–17.0)
MCH: 33.6 pg (ref 26.0–34.0)
MCHC: 34.2 g/dL (ref 30.0–36.0)
MCV: 98.1 fL (ref 80.0–100.0)
Platelets: 196 10*3/uL (ref 150–400)
RBC: 3.78 MIL/uL — ABNORMAL LOW (ref 4.22–5.81)
RDW: 12.9 % (ref 11.5–15.5)
WBC: 5.6 10*3/uL (ref 4.0–10.5)
nRBC: 0 % (ref 0.0–0.2)

## 2021-02-23 LAB — GLUCOSE, CAPILLARY
Glucose-Capillary: 94 mg/dL (ref 70–99)
Glucose-Capillary: 108 mg/dL — ABNORMAL HIGH (ref 70–99)
Glucose-Capillary: 116 mg/dL — ABNORMAL HIGH (ref 70–99)
Glucose-Capillary: 123 mg/dL — ABNORMAL HIGH (ref 70–99)

## 2021-02-23 NOTE — Progress Notes (Addendum)
Occupational Therapy TBI Session Note  Patient Details  Name: Brandon Burke MRN: [id number] Date of Birth: [birth year]  Today's Date: 02/23/2021 Session 1 OT Individual Time: 9450-3888 OT Individual Time Calculation (min): 70 min   Session 2 OT Individual Time: 2800-3491 OT Individual Time Calculation (min): 45 min    Short Term Goals: Week 2:  OT Short Term Goal 1 (Week 2): LTG=STG 2/2 ELOS  Skilled Therapeutic Interventions/Progress Updates:  Session 1   Pt greeted semi-reclined in bed and eager to participate in therapy. When asked orientation questions, pt stated " [place]'m in a trailer on the side of the road." OT oriented pt to place and situation, then he was able to state is was 2022 and October. Pt reported need to go to the bathroom. Verbal cues and CGA for sit<>stand with RW and ambulated to bathroom w/ RW and close supervision. Pt stood to urinate with close supervision. Grooming tasks completed from standing at the sink with supervision for balance. Pt agreeable to change clothes but did not want to bathe. Clothing changed with overall supervision and no LOB in standing to don/doff pants. Socks and shoes donned using figure 4 position.Pt brought down to therapy gym in wc and worked on visual scanning skills with BITS activity using large blue dots on black background. Pt initially needed cues to locate dots in L quadrant, but with repetition, was able to initiate looking L without cues. Progressed to memory activity on BITS with pt able to recall up to three words, but then when we changed all words out, pt perseverating on what previous word order was. Visual motor assessment to locate bells in busy environment. Pt started off finding 4 bells looking L to R, but as pt fatigued, he started circling images that were not bells.B UE coordination and hand eye coordination with seated beach ball toss. Ball tossed to L and R for core strengthening as well. Pt reported soreness in L  shoulder today and unable to perform overhead toss. Pt returned to room and ambulated 10 feet w/ RW back to recliner. Pt left with alarm belt on, call bell in reach, and needs met.  Pt reported L shoulder pain, rest and repositioned for comfrt.   Session 2 Pt greeted seated in wc and agreeable to OT treatment session. Pt reported need to go to the bathroom when asked. Functional ambulation to bathroom w/ RW and CGA. Pt turned to sit on commode with min cues for RW positioning when turning. Pt voided bladder successfully. Min cues for RW placement at the sink to wash hands and verbal cues to locate soap. Pt seemingly more confused than earlier OT session, perseverating on being at the [facility]. OT reoriented pt throughout session. OT discussed dc plan with spouse and brother, re-iterating need for 24/7 supervision and continued therapies at discharge. Pt brought to therapy apartment and addressed RW safety in kitchen environment, reaching into cabinets to collect items. Incorporated word finding and object naming with plastic fruit> Pt returned to room and ambulated 5 feet back to bed with RW and CGA. Pt left semi-reclined in bed with bed alarm on, call bell in reach, and family present.     02/23/21 1500  Observation Details  Observation Environment Room  Start of observation period - Date 02/23/21  Start of observation period - Time 1415  End of observation period - Date 02/23/21  End of observation period - Time 1500  Agitated Behavior Scale (DO NOT LEAVE BLANKS)  Short attention  span, easy distractibility, inability to concentrate 2  Impulsive, impatient, low tolerance for pain or frustration 1  Uncooperative, resistant to care, demanding 1  Violent and/or threatening violence toward people or property 1  Explosive and/or unpredictable anger 1  Rocking, rubbing, moaning, or other self-stimulating behavior 1  Pulling at tubes, restraints, etc. 1  Wandering from treatment areas 1  Restlessness,  pacing, excessive movement 1  Repetitive behaviors, motor, and/or verbal 1  Rapid, loud, or excessive talking 2  Sudden changes of mood 1  Easily initiated or excessive crying and/or laughter 1  Self-abusiveness, physical and/or verbal 1  Agitated behavior scale total score 16     Therapy Documentation Precautions:  Precautions Precautions: Fall Restrictions Weight Bearing Restrictions: No Pain:  Pt reported back pain, rest and repositioned for comfrt.     Therapy/Group: Individual Therapy  Valma Cava 02/23/2021, 3:17 PM

## 2021-02-23 NOTE — Progress Notes (Signed)
Patient ID: Brandon Burke, male   DOB: 27-Nov-1931, 86 y.o.   MRN: [id number]  SW spoke with pt wife Brandon Burke to inform on message received from [place]  [phone]) about recommended private aide agencies. SW provided her with the contact information for [facility] to f/u. SW reviewed [place] therapies that will be put in place once confirmed by medical team, and family edu on Thursday. SW will f/u tomorrow with updates after team conference.   Loralee Pacas, MSW, [place] Office: [phone] Cell: [phone] Fax: [phone]

## 2021-02-23 NOTE — Progress Notes (Signed)
Physical Therapy TBI Note  Patient Details  Name Brandon Burke MRN: [id number] Date of Birth: November 13, 1931  Today's Date: 02/23/2021 PT Individual Time: 4970-2637 PT Individual Time Calculation (min): 69 min   Short Term Goals: Week 2:  PT Short Term Goal 1 (Week 2): STGs = LTGs  Skilled Therapeutic Interventions/Progress Updates:     Pt received seated in recliner and agrees to therapy. Reports stiffness in back. No number provided. PT provides rest breaks and mobility to manage pain. Sit to stand and stand step transfer to [facility] with CGA and cues for initiation and positioning. WC transport outside for time management and energy conservation. Pt oriented to self and time but says that he is at the "[facility]". PT redirects pt multiple times during session but pt at times perseverates on being at the [facility].   Session focused primarily on gait training outdoors over unlevel and varying surfaces. Pt ambulates bouts of 150' x2, 300' x2, and 500' x1 without AD and with CGA/minA. Seated rest breaks between each bout. Pt noted to frequently drag R heel during swing phase and PT cues to increase step heigh and stride length to decrease risk for falls. Pt primarily requires CGA but has several slight LOBs, requiring minA. Pt demos increased unsteadiness during turns, as he tends to lack motor planning to turn head prior to body when turning. PT cues for optimal sequencing of turn but pt continues to have difficulty with pattern. Pt also ambulates up x10 4 inch steps with R hand rial and verbal cues on foot placement for safety. During one rest break, pt plays cards with another pt to challenge cognition and ability to follow directions. While playing, pt is seated on a backless stool to challenge balance and engage core muscles.   WC transport back inside. Pt left seated in WC with all needs within reach.  Therapy Documentation Precautions:   Precautions Precautions: Fall Restrictions Weight Bearing Restrictions: No Agitated Behavior Scale: TBI Observation Details Observation Environment: Room Start of observation period - Date: 02/23/21 Start of observation period - Time: 1415 End of observation period - Date: 02/23/21 End of observation period - Time: 1500 Agitated Behavior Scale (DO NOT LEAVE BLANKS) Short attention span, easy distractibility, inability to concentrate: Present to a slight degree Impulsive, impatient, low tolerance for pain or frustration: Absent Uncooperative, resistant to care, demanding: Absent Violent and/or threatening violence toward people or property: Absent Explosive and/or unpredictable anger: Absent Rocking, rubbing, moaning, or other self-stimulating behavior: Absent Pulling at tubes, restraints, etc.: Absent Wandering from treatment areas: Absent Restlessness, pacing, excessive movement: Absent Repetitive behaviors, motor, and/or verbal: Absent Rapid, loud, or excessive talking: Present to a slight degree Sudden changes of mood: Absent Easily initiated or excessive crying and/or laughter: Absent Self-abusiveness, physical and/or verbal: Absent Agitated behavior scale total score: 16   Therapy/Group: Individual Therapy  Breck Coons, PT, DPT 02/23/2021, 3:42 PM

## 2021-02-23 NOTE — Progress Notes (Signed)
PROGRESS NOTE   Subjective/Complaints:  Pt just returned from OT, pt remains disoriented with poor short term memory  Brother and mom at bedside  ROS: Patient denies CP, SOB, N/V/D   Objective:   No results found. Recent Labs    02/23/21 0527  WBC 5.6  HGB 12.7*  HCT 37.1*  PLT 196     Recent Labs    02/23/21 0527  NA 139  K 3.8  CL 108  CO2 26  GLUCOSE 96  BUN 23  CREATININE 1.10  CALCIUM 8.9      Intake/Output Summary (Last 24 hours) at 02/23/2021 1331 Last data filed at 02/22/2021 1916 Gross per 24 hour  Intake 240 ml  Output --  Net 240 ml         Physical Exam: Vital Signs Blood pressure (!) 118/53, pulse 79, temperature 98 F (36.7 C), resp. rate 18, height 6' 0.01" (1.829 m), weight 77.6 kg, SpO2 99 %.  General: No acute distress Mood and affect are appropriate Heart: Regular rate and rhythm no rubs murmurs or extra sounds Lungs: Clear to auscultation, breathing unlabored, no rales or wheezes Abdomen: Positive bowel sounds, soft nontender to palpation, nondistended Extremities: No clubbing, cyanosis, or edema Skin: No evidence of breakdown, no evidence of rash   Neuro: Alert and oriented to self, not time or place .  STM deficits. Processing delays. Good sitting balance Motor: Grossly 4+/5 throughout, unchanged. No focal sensory deficits  Assessment/Plan: 1. Functional deficits which require 3+ hours per day of interdisciplinary therapy in a comprehensive inpatient rehab setting. Physiatrist is providing close team supervision and 24 hour management of active medical problems listed below. Physiatrist and rehab team continue to assess barriers to discharge/monitor patient progress toward functional and medical goals  Care Tool:  Bathing    Body parts bathed by patient: Right arm, Left arm, Chest, Abdomen, Front perineal area, Buttocks, Right upper leg, Left upper leg, Right lower  leg, Left lower leg, Face   Body parts bathed by helper: Right lower leg, Left lower leg     Bathing assist Assist Level: Contact Guard/Touching assist     Upper Body Dressing/Undressing Upper body dressing   What is the patient wearing?: Pull over shirt    Upper body assist Assist Level: Supervision/Verbal cueing    Lower Body Dressing/Undressing Lower body dressing      What is the patient wearing?: Pants     Lower body assist Assist for lower body dressing: Contact Guard/Touching assist     Toileting Toileting Toileting Activity did not occur (Clothing management and hygiene only): N/A (no void or bm)  Toileting assist Assist for toileting: Supervision/Verbal cueing     Transfers Chair/bed transfer  Transfers assist  Chair/bed transfer activity did not occur: Safety/medical concerns (Pt unable to maintian alertness)  Chair/bed transfer assist level: Minimal Assistance - Patient > 75%     Locomotion Ambulation   Ambulation assist      Assist level: Minimal Assistance - Patient > 75% Assistive device: No Device Max distance: 167ft   Walk 10 feet activity   Assist     Assist level: Contact Guard/Touching assist Assistive device: No Device  Walk 50 feet activity   Assist    Assist level: Contact Guard/Touching assist Assistive device: No Device    Walk 150 feet activity   Assist Walk 150 feet activity did not occur: Safety/medical concerns  Assist level: Contact Guard/Touching assist Assistive device: No Device    Walk 10 feet on uneven surface  activity   Assist     Assist level: Contact Guard/Touching assist Assistive device: Other (comment)   Wheelchair     Assist Is the patient using a wheelchair?: Yes Type of Wheelchair: Manual    Wheelchair assist level: Total Assistance - Patient < 25% Max wheelchair distance: 150    Wheelchair 50 feet with 2 turns activity    Assist        Assist Level: Total  Assistance - Patient < 25%   Wheelchair 150 feet activity     Assist      Assist Level: Total Assistance - Patient < 25%   Blood pressure (!) 118/53, pulse 79, temperature 98 F (36.7 C), resp. rate 18, height 6' 0.01" (1.829 m), weight 77.6 kg, SpO2 99 %.  Medical Problem List and Plan: 1.  TBI/concussion secondary to fall 02/03/2021.  Cranial CT scan negative.  -Continue CIR therapies including PT, OT, and SLP   2.  Antithrombotics: -DVT/anticoagulation:   Eliquis             -antiplatelet therapy: N/A 3. Pain: continue Lidoderm patch, Robaxin as needed, Ultram as needed             Monitor with increased exertion 4. Insomnia: increased seroquel to 50mg . Discussed with wife that this can also help with agitation/sundowning and she is agreeable- patient has been calling her at night               -antipsychotic agents: seroquel at HS with PRN dose  -maintain sleep chart   -reduce stimuli at night (TV)/behavioral plan  -continue ritalin to help with arousal, attention and focus during the day  -Staph Epi UTI: treated  Tele-sitter for safety 5. Neuropsych: This patient is not yet capable of making decisions on his own behalf.  10/18 improving 6. Skin/Wound Care: Left facial laceration or abrasion.  Sutures removed.  Routine skin checks 7. Fluids/Electrolytes/Nutrition:              -encourage PO's  - protein supp for low albumin  - no offending meds  -added HS 1/2ns IVF through 10/23       BMP Latest Ref Rng & Units 02/23/2021 02/20/2021 02/19/2021  Glucose 70 - 99 mg/dL 96 94 102(H)  BUN 8 - 23 mg/dL 23 20 29(H)  Creatinine 0.61 - 1.24 mg/dL 1.10 1.07 1.25(H)  Sodium 135 - 145 mmol/L 139 139 140  Potassium 3.5 - 5.1 mmol/L 3.8 3.6 3.9  Chloride 98 - 111 mmol/L 108 111 108  CO2 22 - 32 mmol/L 26 24 25   Calcium 8.9 - 10.3 mg/dL 8.9 8.6(L) 9.1    8.  Multiple left rib fractures with extrapleural hematoma.  Conservative management 9.  Density right middle lung lobe.   Incidental on CT.  Follow-up outpatient 10.  History of DVT/pulmonary emboli.  Eliquis 11.  Essential hypertension: Tenormin 50 mg daily   Vitals:   02/22/21 2001 02/23/21 0555  BP: (!) 151/75 (!) 118/53  Pulse: 81 79  Resp: 17 18  Temp: 98.7 F (37.1 C) 98 F (36.7 C)  SpO2: 98% 99%    12.  BPH.  Flomax 0.4  mg daily.  voiding and continent x 24 hours 13.  Hyperlipidemia.  Pravachol 14.  Hypothyroidism.  Synthroid 15.  Drug-induced constipation.  MiraLAX daily, Colace 100 mg twice daily             last bm 10/21 16.  Transaminitis  ALT mildly elevated on 10/13, normal 10/20   LOS: 12 days A FACE TO [facility] E Marica T[place] 02/23/2021, 1:31 PM

## 2021-02-24 ENCOUNTER — Ambulatory Visit: Payer: PPO | Admitting: Adult Health

## 2021-02-24 DIAGNOSIS — I10 Essential (primary) hypertension: Secondary | ICD-10-CM

## 2021-02-24 DIAGNOSIS — S069X0D Unspecified intracranial injury without loss of consciousness, subsequent encounter: Secondary | ICD-10-CM

## 2021-02-24 DIAGNOSIS — D62 Acute posthemorrhagic anemia: Secondary | ICD-10-CM

## 2021-02-24 LAB — GLUCOSE, CAPILLARY: Glucose-Capillary: 104 mg/dL — ABNORMAL HIGH (ref 70–99)

## 2021-02-24 NOTE — Progress Notes (Signed)
Speech Language Pathology TBI Note  Patient Details  Name: Brandon Burke MRN: [id number] Date of Birth: [birth year]  Today's Date: 02/24/2021 SLP Individual Time: 1100-1200 SLP Individual Time Calculation (min): 60 min  Short Term Goals: Week 2: SLP Short Term Goal 1 (Week 2): STGs=LTGs due to ELOS  Skilled Therapeutic Interventions: Pt received in recliner chair and agreeable to skilled ST intervention with focus on cognitive goals. Pt endorsed back soreness with movement but denied discomfort when sitting still. Family expressed they requested orders for a heating pad yesterday but had not heard any update. SLP informed Physician during rounds. Patient executed transfer from chair to wheelchair with min A verbal and visual cues for initiation and persisting to task. Pt required min A verbal/visual cues to orient to DOW/mo using large print calendar. Pt was able to recall information when asked by Physician with 75% accuracy following 5 minute delay. Pt completed verbal problem solving task with min A verbal cues for reasoning, sustained attention, and alternating from one topic/task to the next. Pt perseverated on meeting with the [employer] and required min A verbal redirection. Patient transferred from wheelchair to recliner chair with sup A verbal cues to initiate. Patient was left in chair with alarm activated and immediate needs within reach at end of session with family at bedside. Continue per current plan of care.      Pain Pain Assessment Pain Scale: 0-10 Pain Score: 0-No pain  Agitated Behavior Scale: TBI Observation Details Observation Environment: pt room Start of observation period - Date: 02/24/21 Start of observation period - Time: 1100 End of observation period - Date: 02/24/21 End of observation period - Time: 1200 Agitated Behavior Scale (DO NOT LEAVE BLANKS) Short attention span, easy distractibility, inability to concentrate: Present to a slight  degree Impulsive, impatient, low tolerance for pain or frustration: Present to a slight degree Uncooperative, resistant to care, demanding: Absent Violent and/or threatening violence toward people or property: Absent Explosive and/or unpredictable anger: Absent Rocking, rubbing, moaning, or other self-stimulating behavior: Absent Pulling at tubes, restraints, etc.: Absent Wandering from treatment areas: Absent Restlessness, pacing, excessive movement: Absent Repetitive behaviors, motor, and/or verbal: Present to a slight degree Rapid, loud, or excessive talking: Present to a slight degree Sudden changes of mood: Absent Easily initiated or excessive crying and/or laughter: Absent Self-abusiveness, physical and/or verbal: Absent Agitated behavior scale total score: 18  Therapy/Group: Individual Therapy  Patty Sermons 02/24/2021, 12:27 PM

## 2021-02-24 NOTE — Progress Notes (Signed)
Occupational Therapy TBI Note  Patient Details  Name: CALEY VOLKERT MRN: [id number] Date of Birth: 1931-10-28  Today's Date: 02/24/2021 OT Individual Time: 0933-1030 OT Individual Time Calculation (min): 57 min    Short Term Goals: Week 2:  OT Short Term Goal 1 (Week 2): LTG=STG 2/2 ELOS  Skilled Therapeutic Interventions/Progress Updates:    The pt was seen in his room initially, he indicated that he slept well.  The pt was able to come from supine to EOB  for completing functional t/f to the shower using his RW with CGA and vc's for safe task performance. . The pt presented with some confusing ,but was able to remove his clothing items, UB./LB for showering. The pt completed UB/LB bathing with CGA for washing his bottom. The pt ambulated back to his living quarters using his RW with CGA and was seated in his WC for dressing UB/LB.  The pt was able to dress himself for donning under garments with ModA, s/u for his sock, CGA for donning his pants and s/u for donning his shirt.  The pt was wheeled to the gym and completed cognitive task involving following a pattern using the clothes pins.  The pt required both physical and verbal prompts for incorporating his nondominant LUE .  Following the exercise, the pt was returned to his room and t/f from his w/c to the recliner with  his security belt in place and remote within reach.  The pt indicated that he was comfortable and had no additional needs at this time.   Therapy Documentation Precautions:  Precautions Precautions: Fall Restrictions Weight Bearing Restrictions: No Pain: Pain Assessment Pain Scale: 0-10 Pain Score: 0-No pain Agitated Behavior Scale: TBI Observation Details Observation Environment: Room Start of observation period - Date: 02/24/21 Start of observation period - Time: 0930 End of observation period - Date: 02/24/21 End of observation period - Time: 1030 Agitated Behavior Scale (DO NOT LEAVE BLANKS) Short  attention span, easy distractibility, inability to concentrate: Present to a slight degree Impulsive, impatient, low tolerance for pain or frustration: Present to a slight degree Uncooperative, resistant to care, demanding: Absent Violent and/or threatening violence toward people or property: Absent Explosive and/or unpredictable anger: Absent Rocking, rubbing, moaning, or other self-stimulating behavior: Absent Pulling at tubes, restraints, etc.: Absent Wandering from treatment areas: Absent Restlessness, pacing, excessive movement: Present to a slight degree Repetitive behaviors, motor, and/or verbal: Absent Rapid, loud, or excessive talking: Present to a slight degree Sudden changes of mood: Absent Easily initiated or excessive crying and/or laughter: Absent Self-abusiveness, physical and/or verbal: Absent Agitated behavior scale total score: 18  ADL:  Therapy/Group: Individual Therapy  Yvonne Kendall 02/24/2021, 10:47 AM

## 2021-02-24 NOTE — Progress Notes (Signed)
Patient ID: Brandon Burke, male   DOB: 10-Jul-1931, 87 y.o.   MRN: [id number]  SW met with pt, pt wife, and pt brother [name] to provide updates from team conference, and d/c date remains 10/29. Confirms family edu 10/27 9am-12pm with wife, brother, and SIL. SW informed will send referral to [facility] for additional therapies. SW stressed the importance of additional support and 24/7 care at home for her husband. Wife repots she spoke with SW at site to discuss private aide agencies. SW discussed what to share about pt care needs. Intends to follow-up with [facility]. Pt brother intends to provide more support until they are able to get a private aide in place.   Loralee Pacas, MSW, [place] Office: [phone] Cell: [phone] Fax: [phone]

## 2021-02-24 NOTE — Patient Care Conference (Signed)
Inpatient RehabilitationTeam Conference and Plan of Care Update Date: 02/24/2021   Time: 10:39 AM    Patient Name: Brandon Burke [facility]      Medical Record Number: [id number]  Date of Birth: 10-30-1931 Sex: Male         Room/Bed: 4W09C/4W09C-01 Payor Info: Payor: [employer] / Plan: [employer] PPO / Product Type: *No Product type* /    Admit Date/Time:  02/11/2021  3:37 PM  Primary Diagnosis:  TBI (traumatic brain injury)  Hospital Problems: Principal Problem:   TBI (traumatic brain injury) Active Problems:   Transaminitis   History of pulmonary embolus (PE)   Hypothyroidism   Acute lower UTI   Acute blood loss anemia   Benign essential HTN    Expected Discharge Date: Expected Discharge Date: 02/28/21  Team Members Present: Physician leading conference: Dr. Delice Lesch Social Worker Present: Loralee Pacas, [place] Nurse Present: Dorthula Nettles, RN PT Present: Tereasa Coop, PT OT Present: Cherylynn Ridges, OT SLP Present: Sherren Kerns, SLP PPS Coordinator present : Ileana Ladd, PT     Current Status/Progress Goal Weekly Team Focus  Bowel/Bladder   continent b/b, lbm 10/23  Pt will remain continence  timed toileting and prn   Swallow/Nutrition/ Hydration             ADL's   Supervision/CGA, confusion continues,  supervision  self-care retraining, patient/family education, general strengthening, cognitive retraining   Mobility   supervision bed mobility, transfers, CGA to minA gait x300' without AD, tends to lose balance with turns  supervision  Family ed, awareness, balance, DC prep   Communication             Safety/Cognition/ Behavioral Observations  mod A  sup for attention, min A for problem solving/memory, mod A for awareness  orientation, functional recall, problem solving, attention, safety awareness   Pain   7/10 pain  < 3  will assess pain q 4hr and prn   Skin   staples to incision site  no new breakdown  assess skin q shift and prn      Discharge Planning:  Pt to d/c to home with his wife who he lives with at [place] living community. His brother visits from [place] once a week to assist with transportation to appointments, grocery store, etc. Brother has offered support from his wife to help more frequently if needed since she is a retired Therapist, sports. Fam edu on Thursday (10/27) 9am-12pm with wife, brother and SIL.   Team Discussion: Medically stable. On Eliquis for DVT's and PE. Labs are stable. Continent B/B, reports no pain but is confused in the evening. Abrasion to face healing. Educating the spouse on medications. Working on Environmental education officer. Family education scheduled with brother, sister-n-law, and spouse. They are able to support patient and spouse. Spouse is resistant to help. Recommended the support for the patient and spouse. Telesitter provided for safety. Patient on target to meet rehab goals: yes, supervision with ADL's. Confusion is mostly in the afternoons. When ambulating and turns, doesn't turn head and looses balance. Recommending 24/7 supervision. Mod/max assist for cognition. Min assist with problem solving. Making slow progress but is improving.   *See Care Plan and progress notes for long and short-term goals.   Revisions to Treatment Plan:  MD adjusting medications.  Teaching Needs: Family education, medication management, safety awareness.  Current Barriers to Discharge: Decreased caregiver support, Home enviroment access/layout, Wound care, Lack of/limited family support, Medication compliance, and Behavior  Possible Resolutions  to Barriers: Family education, continue current medication, monitor for constipation.     Medical Summary Current Status: TBI/concussion secondary to fall 02/03/2021  Barriers to Discharge: Medical stability  Barriers to Discharge Comments: Telesitter for safety Possible Resolutions to Barriers/Weekly Focus: Therapies, Eliquis for hx of DVT/PE,  optimize bowel meds   Continued Need for Acute Rehabilitation Level of Care: The patient requires daily medical management by a physician with specialized training in physical medicine and rehabilitation for the following reasons: Direction of a multidisciplinary physical rehabilitation program to maximize functional independence : Yes Medical management of patient stability for increased activity during participation in an intensive rehabilitation regime.: Yes Analysis of laboratory values and/or radiology reports with any subsequent need for medication adjustment and/or medical intervention. : Yes   I attest that I was present, lead the team conference, and concur with the assessment and plan of the team.   Cristi Loron 02/24/2021, 1:41 PM

## 2021-02-24 NOTE — Progress Notes (Signed)
Physical Therapy Session Note  Patient Details  Name: Brandon Burke MRN: [id number] Date of Birth: [birth year]  Today's Date: 02/24/2021 PT Individual Time: [phone] and [id number] PT Individual Time Calculation (min): 70 min and 24 min  Short Term Goals: Week 2:  PT Short Term Goal 1 (Week 2): STGs = LTGs  Skilled Therapeutic Interventions/Progress Updates:     1st Session: Pt received supine in bed and agrees to therapy. Reports some tightness in back. Number not provided. Supine to sit with supervision and cues for positioning. Sit to stand with CGA. Pt ambulates to gym, x150', with CGA and cues to turn head prior to turning body when navigating in crowded environment. Pt routinely has increased postural sway when turning and has difficulty incorporating proper motor planning to turn head prior to body, despite repeated cues.   Pt performs NMR for standing balance, performing alternating toe taps on 6 inch step with CGA, 2x5. Activity progressed by having targets on steps in front and lateral to pt and pt having to follow command to tap toe on target. Pt cues for weight shifting and maintaining upright gaze to improve posture and balance. Pt requires CGA for majority of activity and occasional minA.  Pt mentions back "tightening up", so PT has pt perform sit to supine with cues for sequencing. Pt performs 3x10 bridges with blue therband around distal thighs for increased glute med activation. Pt then performs 3x10 clamshells with blue therband, 2x10 trunk rotation in hooklying with cues to engage core, and 3x10 supine bilateral hip flexion, bringing knees to chest, for strengthening and tension relief in low back.  Supine to sit with cues for logrolling technique. Pt ambulates back to room with CGA. Left seated in recliner with alarm intact and all needs within reach.  2nd Session: Pt received seated in recliner asleep. Easily roused and agrees to therapy. No complaint of pain. Sit to  stand with close supervision and cues for initiation. Pt ambulates to toilet with CGA and requires minA when turning to sit on toilet, losing balance and supporting self against wall. Pt is continent of bowel and bladder and independent with pericare. Pt stands from toilet with grab bars and PT provides cueing for sequencing of approach to sink to wash hands. Pt ambulates to gym with CGA and no AD, with cues for upright gaze to improve posture and balance. Pt performs Nustep for strength and endurance training, as well as reciprocal coordination. Pt completes x10:00 at workload of 5 with average steps per minute ~50.  Pt ambulates back to room with minA due to listing to the R initially. Pt left seated in recliner with alarm intact and all needs within reach.  Therapy Documentation Precautions:  Precautions Precautions: Fall Restrictions Weight Bearing Restrictions: No   Therapy/Group: Individual Therapy  Breck Coons, PT, DPT 02/24/2021, 3:27 PM

## 2021-02-24 NOTE — Progress Notes (Signed)
PROGRESS NOTE   Subjective/Complaints: Patient seen sitting up working with therapies this AM.  He states he slept well overnight. Discussed mentation with therapies, which appears to be improving, pt with increased awareness of deficits.   ROS: Denies CP, SOB, N/V/D  Objective:   No results found. Recent Labs    02/23/21 0527  WBC 5.6  HGB 12.7*  HCT 37.1*  PLT 196     Recent Labs    02/23/21 0527  NA 139  K 3.8  CL 108  CO2 26  GLUCOSE 96  BUN 23  CREATININE 1.10  CALCIUM 8.9      Intake/Output Summary (Last 24 hours) at 02/24/2021 1039 Last data filed at 02/24/2021 0834 Gross per 24 hour  Intake 476 ml  Output --  Net 476 ml         Physical Exam: Vital Signs Blood pressure (!) 131/57, pulse 85, temperature 98.8 F (37.1 C), temperature source Oral, resp. rate 16, height 6' 0.01" (1.829 m), weight 77.6 kg, SpO2 96 %. Constitutional: No distress . Vital signs reviewed. HENT: Normocephalic.  Atraumatic. Eyes: EOMI. No discharge. Cardiovascular: No JVD.  RRR. Respiratory: Normal effort.  No stridor.  Bilateral clear to auscultation. GI: Non-distended.  BS +. Skin: Warm and dry.  Intact. Psych: Normal mood.  Normal behavior. Musc: No edema in extremities.  No tenderness in extremities. Neuro: Alert and oriented, except for date of month (recently reviewed with therapies) Motor: Grossly 4+/5 throughout, stable  Assessment/Plan: 1. Functional deficits which require 3+ hours per day of interdisciplinary therapy in a comprehensive inpatient rehab setting. Physiatrist is providing close team supervision and 24 hour management of active medical problems listed below. Physiatrist and rehab team continue to assess barriers to discharge/monitor patient progress toward functional and medical goals  Care Tool:  Bathing    Body parts bathed by patient: Right arm, Left arm, Chest, Abdomen, Front  perineal area, Buttocks, Right upper leg, Left upper leg, Right lower leg, Left lower leg, Face   Body parts bathed by helper: Right lower leg, Left lower leg     Bathing assist Assist Level: Contact Guard/Touching assist     Upper Body Dressing/Undressing Upper body dressing   What is the patient wearing?: Pull over shirt    Upper body assist Assist Level: Supervision/Verbal cueing    Lower Body Dressing/Undressing Lower body dressing      What is the patient wearing?: Pants     Lower body assist Assist for lower body dressing: Contact Guard/Touching assist     Toileting Toileting Toileting Activity did not occur (Clothing management and hygiene only): N/A (no void or bm)  Toileting assist Assist for toileting: Supervision/Verbal cueing     Transfers Chair/bed transfer  Transfers assist  Chair/bed transfer activity did not occur: Safety/medical concerns (Pt unable to maintian alertness)  Chair/bed transfer assist level: Minimal Assistance - Patient > 75%     Locomotion Ambulation   Ambulation assist      Assist level: Minimal Assistance - Patient > 75% Assistive device: No Device Max distance: 11ft   Walk 10 feet activity   Assist     Assist level: Contact Guard/Touching assist  Assistive device: No Device   Walk 50 feet activity   Assist    Assist level: Contact Guard/Touching assist Assistive device: No Device    Walk 150 feet activity   Assist Walk 150 feet activity did not occur: Safety/medical concerns  Assist level: Contact Guard/Touching assist Assistive device: No Device    Walk 10 feet on uneven surface  activity   Assist     Assist level: Contact Guard/Touching assist Assistive device: Other (comment)   Wheelchair     Assist Is the patient using a wheelchair?: Yes Type of Wheelchair: Manual    Wheelchair assist level: Total Assistance - Patient < 25% Max wheelchair distance: 150    Wheelchair 50 feet with  2 turns activity    Assist        Assist Level: Total Assistance - Patient < 25%   Wheelchair 150 feet activity     Assist      Assist Level: Total Assistance - Patient < 25%   Blood pressure (!) 131/57, pulse 85, temperature 98.8 F (37.1 C), temperature source Oral, resp. rate 16, height 6' 0.01" (1.829 m), weight 77.6 kg, SpO2 96 %.  Medical Problem List and Plan: 1.  TBI/concussion secondary to fall 02/03/2021.  Cranial CT scan negative.  Continue CIR  Team conference today to discuss current and goals and coordination of care, home and environmental barriers, and discharge planning with nursing, case manager, and therapies. Please see conference note from today as well.  2.  Antithrombotics: -DVT/anticoagulation:   Eliquis             -antiplatelet therapy: N/A 3. Pain: continue Lidoderm patch, Robaxin as needed, Ultram as needed             Monitor with increased exertion 4. Insomnia: increased seroquel to 50mg . Discussed with wife that this can also help with agitation/sundowning and she is agreeable- patient has been calling her at night               -antipsychotic agents: seroquel at HS with PRN dose  -maintain sleep chart  -reduce stimuli at night (TV)/behavioral plan  -continue ritalin to help with arousal, attention and focus during the day  -Staph Epi UTI: treated  Tele-sitter for safety 5. Neuropsych: This patient is not capable of making decisions on his own behalf. 6. Skin/Wound Care: Left facial laceration or abrasion.  Sutures removed.  Routine skin checks 7. Fluids/Electrolytes/Nutrition:              -encourage PO's  - protein supp for low albumin  - no offending meds    BMP Latest Ref Rng & Units 02/23/2021 02/20/2021 02/19/2021  Glucose 70 - 99 mg/dL 96 94 102(H)  BUN 8 - 23 mg/dL 23 20 29(H)  Creatinine 0.61 - 1.24 mg/dL 1.10 1.07 1.25(H)  Sodium 135 - 145 mmol/L 139 139 140  Potassium 3.5 - 5.1 mmol/L 3.8 3.6 3.9  Chloride 98 - 111 mmol/L  108 111 108  CO2 22 - 32 mmol/L 26 24 25   Calcium 8.9 - 10.3 mg/dL 8.9 8.6(L) 9.1    8.  Multiple left rib fractures with extrapleural hematoma.  Conservative management 9.  Density right middle lung lobe.  Incidental on CT.  Follow-up outpatient 10.  History of DVT/pulmonary emboli.  Eliquis 11.  Essential hypertension: Tenormin 50 mg daily   Vitals:   02/24/21 0535 02/24/21 0739  BP: 110/64 (!) 131/57  Pulse: 76 85  Resp: 16   Temp: 98.8  F (37.1 C)   SpO2: 96%    Controlled on 10/25 12.  BPH.  Flomax 0.4 mg daily.    Improved 13.  Hyperlipidemia.  Pravachol 14.  Hypothyroidism.  Synthroid 15.  Drug-induced constipation.  MiraLAX daily, Colace 100 mg twice daily             Improving 16.  Transaminitis: Resolved 17. Acute blood loss anemia  Hb 12.7 on 10/24  Cont to monitor  LOS: 13 days A FACE TO FACE EVALUATION WAS PERFORMED  Mirtie Bastyr Lorie Phenix 02/24/2021, 10:39 AM

## 2021-02-24 NOTE — Discharge Summary (Addendum)
Physician Discharge Summary  Patient ID: Brandon Burke MRN: [id number] DOB/AGE: 1931-09-06 87 y.o.  Admit date: 02/11/2021 Discharge date: 02/28/2021  Discharge Diagnoses:  Principal Problem:   TBI (traumatic brain injury) Active Problems:   Transaminitis   History of pulmonary embolus (PE)   Hypothyroidism   Acute lower UTI   Acute blood loss anemia   Benign essential HTN Multiple left rib fractures with extrapleural hematoma Density right middle lung lobe Hyperlipidemia Hypothyroidism Drug-induced constipation History of prostate cancer  Discharged Condition: Stable  Significant Diagnostic Studies: CT HEAD WO CONTRAST  Result Date: 02/03/2021 CLINICAL DATA:  Facial trauma, fall on cement steps. EXAM: CT HEAD WITHOUT CONTRAST CT MAXILLOFACIAL WITHOUT CONTRAST CT CERVICAL SPINE WITHOUT CONTRAST TECHNIQUE: Multidetector CT imaging of the head, cervical spine, and maxillofacial structures were performed using the standard protocol without intravenous contrast. Multiplanar CT image reconstructions of the cervical spine and maxillofacial structures were also generated. COMPARISON:  CT head and maxillofacial 08/09/2019 FINDINGS: CT HEAD FINDINGS Brain: No evidence of acute infarction, hemorrhage, hydrocephalus, extra-axial collection or mass lesion/mass effect. Again seen is mild diffuse atrophy. There is stable mild periventricular white matter hypodensity, likely chronic small vessel ischemic change. Small old infarct in the right frontal periventricular white matter appears new from the prior examination. Vascular: Atherosclerotic calcifications are present within the cavernous internal carotid arteries. Skull: Normal. Negative for fracture or focal lesion. Other: There is left temporal scalp soft tissue swelling. CT MAXILLOFACIAL FINDINGS Osseous: No acute fractures are identified. There are old left medial and left inferior orbital wall fractures. No dislocation. Orbits: There is  preseptal left orbital soft tissue swelling. Globes are intact bilaterally. Postseptal orbital soft tissues are within normal limits. Sinuses: There is scattered opacification of left ethmoid air cells. There is mild mucosal thickening of the left maxillary sinus. No air-fluid levels are seen. The mastoid air cells appear clear. Soft tissues: Negative. CT CERVICAL SPINE FINDINGS Alignment: There is trace anterolisthesis at C4-C5 which is favored is degenerative. Alignment is otherwise anatomic. Skull base and vertebrae: No acute fracture. No primary bone lesion or focal pathologic process. Soft tissues and spinal canal: No prevertebral fluid or swelling. No visible canal hematoma. Disc levels: There is disc space narrowing throughout the cervical spine most significant at C3-C4, C5-C6 and C6-C7 compatible with degenerative change. Facet arthropathy is seen bilaterally. There is no severe central canal or neural foraminal stenosis. Upper chest: Negative. Other: None IMPRESSION: 1. No acute intracranial process. 2. No acute fracture or traumatic subluxation of cervical spine. 3. No acute facial fracture. 4. Mild progression of chronic small vessel ischemic change in the brain. 5. Old left orbital wall fractures. Electronically Signed   By: Ronney Asters M.D.   On: 02/03/2021 17:39   CT Chest Wo Contrast  Result Date: 01/31/2021 CLINICAL DATA:  Follow-up pulmonary nodules. EXAM: CT CHEST WITHOUT CONTRAST TECHNIQUE: Multidetector CT imaging of the chest was performed following the standard protocol without IV contrast. COMPARISON:  CT chest 01/29/2020 and 10/31/2019. FINDINGS: Cardiovascular: Heart size is normal. Coronary artery calcifications again noted. 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 Lungs/Pleura: Nodular density along the posterior right middle lobe, adjacent to the minor fissure continues to decrease in size. The lesion now measures 15 x  12 x 15 mm. Sub pleural reticulation again noted bilaterally without significant change, predominantly in the dependent lower lobes. The previously described separate bilobed nodule is contiguous with the above described scarring. Scarring posteriorly along the right  major fissure is stable. Areas of reticulation and scarring in the left lower lobe are stable. Chronic scarring and reticulation in the lingula is unchanged. No significant pleural effusions free air is present. Upper Abdomen: Cystic changes in the liver stable. Visualized upper abdomen is otherwise within normal limits. Musculoskeletal: The vertebral body heights and alignment are normal. Ribs are unremarkable. IMPRESSION: 1. Continued decrease in size of posterior right middle lobe nodule, adjacent to the minor fissure. This likely represents scarring previous infection. No further follow-up necessary. 2. Other stable chronic findings as described. 3. Coronary artery disease. 4. Aortic Atherosclerosis (ICD10-I70.0). Electronically Signed   By: San Morelle M.D.   On: 01/31/2021 07:53   CT CHEST W CONTRAST  Result Date: 02/03/2021 CLINICAL DATA:  Abdominal trauma, fell on cement steps. EXAM: CT CHEST, ABDOMEN, AND PELVIS WITH CONTRAST TECHNIQUE: Multidetector CT imaging of the chest, abdomen and pelvis was performed following the standard protocol during bolus administration of intravenous contrast. CONTRAST:  114mL OMNIPAQUE IOHEXOL 300 MG/ML  SOLN COMPARISON:  None. CT chest 01/30/2021. FINDINGS: CT CHEST FINDINGS Cardiovascular: No significant vascular findings. Normal heart size. No pericardial effusion. There are atherosclerotic calcifications of the aorta and coronary arteries Mediastinum/Nodes: No enlarged mediastinal, hilar, or axillary lymph nodes. Thyroid gland, trachea, and esophagus demonstrate no significant findings. Right lower pericardial cyst appears unchanged from prior. Lungs/Pleura: There is some patchy airspace and  ground-glass opacity new left lower lobe and minimal patchy ground-glass opacity in the right lower lobe. Elongated nodular density in the right middle lobe measures 3.1 by 1.0 cm and has slightly increased in size when compared to the prior examination. There is a small amount of pleural fluid adjacent to rib fractures posteriorly in the left hemithorax. There is no evidence for pneumothorax. Musculoskeletal: There are nondisplaced posterior left third fourth and eighth rib fractures. There are displaced left fifth, sixth and seventh posterior rib fractures. CT ABDOMEN PELVIS FINDINGS Hepatobiliary: There is a cyst in the inferior right lobe of the liver measuring 3.2 cm. There are additional rounded hypodensities in the liver which are too small to characterize, also likely cysts. Gallbladder and bile ducts are within normal limits. Pancreas: Unremarkable. No pancreatic ductal dilatation or surrounding inflammatory changes. Spleen: No splenic injury or perisplenic hematoma. Adrenals/Urinary Tract: There are cortical hypodensities in the right kidney which are too small to characterize, most likely cysts. The kidneys and adrenal glands are otherwise within normal limits. Bladder is distended and within normal limits. Stomach/Bowel: Stomach is within normal limits. Appendix appears normal. No evidence of bowel wall thickening, distention, or inflammatory changes. There is a large amount of stool throughout the colon. Vascular/Lymphatic: Aortic atherosclerosis. No enlarged abdominal or pelvic lymph nodes. Reproductive: Status post prostatectomy. Other: There is a small fat containing umbilical hernia. There is no ascites. There is no body wall hematoma. Musculoskeletal: No acute or significant osseous findings. IMPRESSION: 1. Acute posterior left third through eighth rib fractures. Adjacent pleural thickening/hematoma. No pneumothorax. 2. Left lower lobe patchy airspace disease worrisome for infection. Pulmonary  contusion not excluded. 3. Elongated lobulated density in the right middle lobe has increased in size. This is indeterminate. Neoplasm cannot be excluded. Consider follow-up PET-CT or tissue sampling. 4. No acute localizing process in the abdomen or pelvis. Electronically Signed   By: Ronney Asters M.D.   On: 02/03/2021 17:54   CT CERVICAL SPINE WO CONTRAST  Result Date: 02/03/2021 CLINICAL DATA:  Facial trauma, fall on cement steps. EXAM: CT HEAD WITHOUT CONTRAST CT  MAXILLOFACIAL WITHOUT CONTRAST CT CERVICAL SPINE WITHOUT CONTRAST TECHNIQUE: Multidetector CT imaging of the head, cervical spine, and maxillofacial structures were performed using the standard protocol without intravenous contrast. Multiplanar CT image reconstructions of the cervical spine and maxillofacial structures were also generated. COMPARISON:  CT head and maxillofacial 08/09/2019 FINDINGS: CT HEAD FINDINGS Brain: No evidence of acute infarction, hemorrhage, hydrocephalus, extra-axial collection or mass lesion/mass effect. Again seen is mild diffuse atrophy. There is stable mild periventricular white matter hypodensity, likely chronic small vessel ischemic change. Small old infarct in the right frontal periventricular white matter appears new from the prior examination. Vascular: Atherosclerotic calcifications are present within the cavernous internal carotid arteries. Skull: Normal. Negative for fracture or focal lesion. Other: There is left temporal scalp soft tissue swelling. CT MAXILLOFACIAL FINDINGS Osseous: No acute fractures are identified. There are old left medial and left inferior orbital wall fractures. No dislocation. Orbits: There is preseptal left orbital soft tissue swelling. Globes are intact bilaterally. Postseptal orbital soft tissues are within normal limits. Sinuses: There is scattered opacification of left ethmoid air cells. There is mild mucosal thickening of the left maxillary sinus. No air-fluid levels are seen. The  mastoid air cells appear clear. Soft tissues: Negative. CT CERVICAL SPINE FINDINGS Alignment: There is trace anterolisthesis at C4-C5 which is favored is degenerative. Alignment is otherwise anatomic. Skull base and vertebrae: No acute fracture. No primary bone lesion or focal pathologic process. Soft tissues and spinal canal: No prevertebral fluid or swelling. No visible canal hematoma. Disc levels: There is disc space narrowing throughout the cervical spine most significant at C3-C4, C5-C6 and C6-C7 compatible with degenerative change. Facet arthropathy is seen bilaterally. There is no severe central canal or neural foraminal stenosis. Upper chest: Negative. Other: None IMPRESSION: 1. No acute intracranial process. 2. No acute fracture or traumatic subluxation of cervical spine. 3. No acute facial fracture. 4. Mild progression of chronic small vessel ischemic change in the brain. 5. Old left orbital wall fractures. Electronically Signed   By: Ronney Asters M.D.   On: 02/03/2021 17:39   CT ABDOMEN PELVIS W CONTRAST  Result Date: 02/03/2021 CLINICAL DATA:  Abdominal trauma, fell on cement steps. EXAM: CT CHEST, ABDOMEN, AND PELVIS WITH CONTRAST TECHNIQUE: Multidetector CT imaging of the chest, abdomen and pelvis was performed following the standard protocol during bolus administration of intravenous contrast. CONTRAST:  147mL OMNIPAQUE IOHEXOL 300 MG/ML  SOLN COMPARISON:  None. CT chest 01/30/2021. FINDINGS: CT CHEST FINDINGS Cardiovascular: No significant vascular findings. Normal heart size. No pericardial effusion. There are atherosclerotic calcifications of the aorta and coronary arteries Mediastinum/Nodes: No enlarged mediastinal, hilar, or axillary lymph nodes. Thyroid gland, trachea, and esophagus demonstrate no significant findings. Right lower pericardial cyst appears unchanged from prior. Lungs/Pleura: There is some patchy airspace and ground-glass opacity new left lower lobe and minimal patchy  ground-glass opacity in the right lower lobe. Elongated nodular density in the right middle lobe measures 3.1 by 1.0 cm and has slightly increased in size when compared to the prior examination. There is a small amount of pleural fluid adjacent to rib fractures posteriorly in the left hemithorax. There is no evidence for pneumothorax. Musculoskeletal: There are nondisplaced posterior left third fourth and eighth rib fractures. There are displaced left fifth, sixth and seventh posterior rib fractures. CT ABDOMEN PELVIS FINDINGS Hepatobiliary: There is a cyst in the inferior right lobe of the liver measuring 3.2 cm. There are additional rounded hypodensities in the liver which are too small to characterize, also  likely cysts. Gallbladder and bile ducts are within normal limits. Pancreas: Unremarkable. No pancreatic ductal dilatation or surrounding inflammatory changes. Spleen: No splenic injury or perisplenic hematoma. Adrenals/Urinary Tract: There are cortical hypodensities in the right kidney which are too small to characterize, most likely cysts. The kidneys and adrenal glands are otherwise within normal limits. Bladder is distended and within normal limits. Stomach/Bowel: Stomach is within normal limits. Appendix appears normal. No evidence of bowel wall thickening, distention, or inflammatory changes. There is a large amount of stool throughout the colon. Vascular/Lymphatic: Aortic atherosclerosis. No enlarged abdominal or pelvic lymph nodes. Reproductive: Status post prostatectomy. Other: There is a small fat containing umbilical hernia. There is no ascites. There is no body wall hematoma. Musculoskeletal: No acute or significant osseous findings. IMPRESSION: 1. Acute posterior left third through eighth rib fractures. Adjacent pleural thickening/hematoma. No pneumothorax. 2. Left lower lobe patchy airspace disease worrisome for infection. Pulmonary contusion not excluded. 3. Elongated lobulated density in the  right middle lobe has increased in size. This is indeterminate. Neoplasm cannot be excluded. Consider follow-up PET-CT or tissue sampling. 4. No acute localizing process in the abdomen or pelvis. Electronically Signed   By: Ronney Asters M.D.   On: 02/03/2021 17:54   DG Pelvis Portable  Result Date: 02/03/2021 CLINICAL DATA:  Status post fall on blood thinners.  Trauma. EXAM: PORTABLE PELVIS 1-2 VIEWS COMPARISON:  None. FINDINGS: There is no evidence of pelvic fracture or diastasis. No acute displaced fracture or dislocation of bilateral hips on frontal view. No pelvic bone lesions are seen. Surgical clips overlie the pelvis. Phleboliths noted overlying the pelvis. IMPRESSION: Negative. Electronically Signed   By: Iven Finn M.D.   On: 02/03/2021 16:34   DG Chest Port 1 View  Result Date: 02/04/2021 CLINICAL DATA:  87 year old male with history of fall. Rib fractures. EXAM: PORTABLE CHEST 1 VIEW COMPARISON:  Chest x-ray 02/03/2021. FINDINGS: Lung volumes are low. Bibasilar opacities (left greater than right), favored to reflect areas of subsegmental atelectasis. Trace left pleural effusion. No definite right pleural effusion. No consolidative airspace disease. No pleural effusions. No pneumothorax confidently identified. No pulmonary nodule or mass noted. Pulmonary vasculature and the cardiomediastinal silhouette are within normal limits. Multiple posterior left-sided rib fractures are again noted (left third through eighth), better demonstrated on prior chest CT 02/03/2021. IMPRESSION: 1. Multiple posterior left-sided rib fractures with low lung volumes and bibasilar areas of subsegmental atelectasis. 2. Small left pleural effusion. 3. No definite pneumothorax. Electronically Signed   By: Vinnie Langton M.D.   On: 02/04/2021 05:16   DG Chest Port 1 View  Result Date: 02/03/2021 CLINICAL DATA:  Status post fall on blood thinners.  Trauma. EXAM: PORTABLE CHEST 1 VIEW.  Top left apex collimated off  view. COMPARISON:  Chest x-ray 10/19/2019, CT chest 01/30/2021 FINDINGS: The heart and mediastinal contours are unchanged. Aortic calcification. Persistent bilateral lower lobe airspace opacities with blunting of bilateral costophrenic angles findings better evaluated on 01/30/2021. No pulmonary edema. No pleural effusion. No pneumothorax. Acute minimally displaced posterior left fifth through seventh rib fractures. Likely acute nondisplaced left posterior third and fourth rib fractures. IMPRESSION: Acute minimally displaced posterior left 5-7th rib fractures. Likely acute nondisplaced left posterior 3rd and 4th rib fractures. No significant pneumothorax identified with top left apex collimated off view. Electronically Signed   By: Iven Finn M.D.   On: 02/03/2021 16:38   CT MAXILLOFACIAL WO CONTRAST  Result Date: 02/03/2021 CLINICAL DATA:  Facial trauma, fall on cement steps.  EXAM: CT HEAD WITHOUT CONTRAST CT MAXILLOFACIAL WITHOUT CONTRAST CT CERVICAL SPINE WITHOUT CONTRAST TECHNIQUE: Multidetector CT imaging of the head, cervical spine, and maxillofacial structures were performed using the standard protocol without intravenous contrast. Multiplanar CT image reconstructions of the cervical spine and maxillofacial structures were also generated. COMPARISON:  CT head and maxillofacial 08/09/2019 FINDINGS: CT HEAD FINDINGS Brain: No evidence of acute infarction, hemorrhage, hydrocephalus, extra-axial collection or mass lesion/mass effect. Again seen is mild diffuse atrophy. There is stable mild periventricular white matter hypodensity, likely chronic small vessel ischemic change. Small old infarct in the right frontal periventricular white matter appears new from the prior examination. Vascular: Atherosclerotic calcifications are present within the cavernous internal carotid arteries. Skull: Normal. Negative for fracture or focal lesion. Other: There is left temporal scalp soft tissue swelling. CT  MAXILLOFACIAL FINDINGS Osseous: No acute fractures are identified. There are old left medial and left inferior orbital wall fractures. No dislocation. Orbits: There is preseptal left orbital soft tissue swelling. Globes are intact bilaterally. Postseptal orbital soft tissues are within normal limits. Sinuses: There is scattered opacification of left ethmoid air cells. There is mild mucosal thickening of the left maxillary sinus. No air-fluid levels are seen. The mastoid air cells appear clear. Soft tissues: Negative. CT CERVICAL SPINE FINDINGS Alignment: There is trace anterolisthesis at C4-C5 which is favored is degenerative. Alignment is otherwise anatomic. Skull base and vertebrae: No acute fracture. No primary bone lesion or focal pathologic process. Soft tissues and spinal canal: No prevertebral fluid or swelling. No visible canal hematoma. Disc levels: There is disc space narrowing throughout the cervical spine most significant at C3-C4, C5-C6 and C6-C7 compatible with degenerative change. Facet arthropathy is seen bilaterally. There is no severe central canal or neural foraminal stenosis. Upper chest: Negative. Other: None IMPRESSION: 1. No acute intracranial process. 2. No acute fracture or traumatic subluxation of cervical spine. 3. No acute facial fracture. 4. Mild progression of chronic small vessel ischemic change in the brain. 5. Old left orbital wall fractures. Electronically Signed   By: Ronney Asters M.D.   On: 02/03/2021 17:39    Labs:  Basic Metabolic Panel: Recent Labs  Lab 02/20/21 0505 02/23/21 0527  NA 139 139  K 3.6 3.8  CL 111 108  CO2 24 26  GLUCOSE 94 96  BUN 20 23  CREATININE 1.07 1.10  CALCIUM 8.6* 8.9    CBC: Recent Labs  Lab 02/23/21 0527  WBC 5.6  HGB 12.7*  HCT 37.1*  MCV 98.1  PLT 196    CBG: Recent Labs  Lab 02/23/21 1156 02/23/21 1630 02/23/21 2141 02/24/21 0640 02/25/21 0622  GLUCAP 108* 116* 123* 104* 102*   Family history.  Positive for  hypertension as well as hyperlipidemia.  Denies any colon cancer esophageal cancer or rectal cancer  Brief HPI:   Brandon Burke is a 87 y.o. right-handed male with history of prostate cancer DVT/pulmonary emboli maintained on Eliquis, hypertension, mitral valve prolapse.  Per chart review lives with wife at independent living facility.  Presented 02/03/2021 after unwitnessed fall.  Unknown loss of consciousness.  He was amnesic to the event.  Cranial CT scan of the head, maxillofacial and cervical spine unremarkable for acute intracranial process.  No acute fracture or traumatic subluxation of cervical spine.  No acute facial fracture.  There was noted old left orbital wall fractures.  CT of the chest abdomen pelvis showed acute posterior left third through eighth rib fractures.  Adjacent pleural thickening hematoma.  No pneumothorax.  Left lower patchy airspace disease worrisome for infection.  Pulmonary contusion not excluded.  No acute localizing process in the abdomen or pelvis.  There was an elongated lobulated density in the right middle lobe increased in size since prior comparison plan outpatient follow-up.  Patient did sustain left facial laceration and abrasion with repair sutures in the ED.  Admission chemistries unremarkable except creatinine 1.30 alcohol negative lactic acid 2.0.  Hospital course further complicated by acute lower UTI greater than 100,000 Staphylococcus completing antibiotic therapy.  Patient was cleared to continue chronic Eliquis.  He did have some urinary retention issues Foley tube but patient pulled out tube planning voiding trial.  Therapy evaluations completed due to patient decreased functional mobility cognitive deficits was admitted for a comprehensive rehab program   Hospital Course: [facility] was admitted to rehab 02/11/2021 for inpatient therapies to consist of PT, ST and OT at least three hours five days a week. Past admission physiatrist, therapy team  and rehab RN have worked together to provide customized collaborative inpatient rehab.  Pertaining to patient's TBI concussion secondary to fall cranial CT scan negative.  He was participating with full therapies.  He continued on chronic Eliquis for history of DVT pulmonary emboli no chest pain shortness of breath or bleeding episodes.  Pain managed with use of Lidoderm patch as well as Robaxin as needed.  Bouts of insomnia some nighttime sundowning maintained on Seroquel.  Ritalin was added to help patient maintain focus and attention to tasks.  Multiple left rib fractures extrapleural hematoma conservative care.  During work-up noted density right middle lung lobe somewhat increased in size since similar comparison follow-up outpatient.  Blood pressure controlled on Tenormin.  BPH with history of prostate cancer maintained on Flomax.  No dysuria or hematuria.  Pravachol ongoing for hyperlipidemia.  Synthroid for hypothyroidism.  Bouts of constipation resolved with laxative assistance.   Blood pressures were monitored on TID basis and soft and monitored     Rehab course: During patient's stay in rehab weekly team conferences were held to monitor patient's progress, set goals and discuss barriers to discharge. At admission, patient required minimal assist 250 feet rolling walker moderate assist supine to sit mod assist side-lying to sitting  Physical exam.  Blood pressure 140/66 pulse 68 temperature 97.5 respirations 17 oxygen saturations 100% room air Constitutional.  No acute distress HEENT.  Healing abrasions with ecchymosis to face and scalp Eyes.  Pupils round and reactive to light no discharge without nystagmus.  There was a left eye ptosis Neck.  Supple nontender no JVD without thyromegaly Cardiac regular rate rhythm without extra sounds or murmur heard Abdomen.  Soft nontender positive bowel sounds without rebound Respiratory effort normal no respiratory distress without wheeze Skin.   Scattered lesions Neurologic.  Alert oriented x2 somewhat impulsive.  Makes eye contact with examiner.  Follows simple commands.  He does have some decreased insight and awareness. Motor.  4+/5 throughout  He/She  has had improvement in activity tolerance, balance, postural control as well as ability to compensate for deficits. He/She has had improvement in functional use RUE/LUE  and RLE/LLE as well as improvement in awareness.  Ambulates to the gym 150 feet contact-guard assist with minimal cues.  Supine to sit with supervision.  Standing balance contact-guard assist.  Requires contact-guard assist for majority of activity and occasional minimal assist.  Ambulates to the toilet with contact-guard.  Patient was able to come from supine to edge of bed for  completing functional ADLs to the shower using rolling walker contact-guard.  Upper lower body showering with supervision.  Completed upper and lower body bathing with contact-guard.  Patient required minimal verbal visual cues to orient to using a large print calendar.  Patient was able to recall information when asked by physician and staff of 75% accuracy.  He did have some perseveration.  It was discussed with the wife the need for supervision for safety.  Full family teaching completed plan discharged to home       Disposition: Discharge to home    Diet: Regular  Special Instructions: No driving smoking or alcohol  Follow-up outpatient in regards to density right middle lung lobe  Medications at discharge 1.  Tylenol as needed 2.  Eliquis 2.5 mg p.o. twice daily 3 Tenormin 50 mg p.o. daily 4.  Colace 100 mg p.o. twice daily 5.  Cosopt ophthalmic solution 1 drop both eyes twice daily 6.Xalatan ophthalmic solution 0.05% 1 drop both eyes nightly 7.  Synthroid 25 mcg p.o. twice daily 8.  Lidoderm patch change as directed 9.  Ritalin 5 mg p.o. twice daily 10.  MiraLAX daily hold for loose stools 11.  Pravachol 40 mg p.o. nightly 12.   Seroquel 50 mg p.o. nightly 13.  Flomax 0.4 mg daily after supper 14.  Tramadol 50 mg p.o. every 6 hours as needed pain  30-35 minutes were spent completing discharge summary and discharge planning  Discharge Instructions     Ambulatory referral to Physical Medicine Rehab   Complete by: As directed    Moderate complexity follow-up 1 to 2 weeks TBI        Follow-up Information     Meredith Staggers, MD Follow up.   Specialty: Physical Medicine and Rehabilitation Why: Office to call for appointment Contact information: [address] [place] [address] [phone]                 Signed: Cathlyn Parsons 02/27/2021, 5:01 AM

## 2021-02-25 LAB — GLUCOSE, CAPILLARY: Glucose-Capillary: 102 mg/dL — ABNORMAL HIGH (ref 70–99)

## 2021-02-25 MED ORDER — DOCUSATE SODIUM 100 MG PO CAPS: 100.0000 mg | ORAL_CAPSULE | Freq: Two times a day (BID) | ORAL | 0 refills | Status: DC

## 2021-02-25 MED ORDER — APIXABAN 2.5 MG PO TABS: 2.5000 mg | ORAL_TABLET | Freq: Two times a day (BID) | ORAL | 1 refills | Status: DC

## 2021-02-25 MED ORDER — TRAMADOL HCL 50 MG PO TABS: 50.0000 mg | ORAL_TABLET | Freq: Four times a day (QID) | ORAL | 0 refills | Status: DC | PRN

## 2021-02-25 MED ORDER — TAMSULOSIN HCL 0.4 MG PO CAPS: 0.4000 mg | ORAL_CAPSULE | Freq: Every day | ORAL | 0 refills | Status: DC

## 2021-02-25 MED ORDER — ACETAMINOPHEN 325 MG PO TABS: 650.0000 mg | ORAL_TABLET | Freq: Four times a day (QID) | ORAL | Status: AC | PRN

## 2021-02-25 MED ORDER — METHYLPHENIDATE HCL 5 MG PO TABS: 5.0000 mg | ORAL_TABLET | Freq: Two times a day (BID) | ORAL | 0 refills | Status: DC

## 2021-02-25 MED ORDER — POLYETHYLENE GLYCOL 3350 17 G PO PACK: 17.0000 g | PACK | Freq: Every day | ORAL | 0 refills | Status: AC

## 2021-02-25 MED ORDER — PRAVASTATIN SODIUM 40 MG PO TABS: 40.0000 mg | ORAL_TABLET | Freq: Every day | ORAL | 0 refills | Status: DC

## 2021-02-25 MED ORDER — LIDOCAINE 5 % EX PTCH: 1.0000 | MEDICATED_PATCH | CUTANEOUS | 0 refills | Status: DC

## 2021-02-25 MED ORDER — LEVOTHYROXINE SODIUM 25 MCG PO TABS: 25.0000 ug | ORAL_TABLET | Freq: Two times a day (BID) | ORAL | 1 refills | Status: DC

## 2021-02-25 MED ORDER — ATENOLOL 50 MG PO TABS: 50.0000 mg | ORAL_TABLET | Freq: Every day | ORAL | 0 refills | Status: DC

## 2021-02-25 MED ORDER — QUETIAPINE FUMARATE 50 MG PO TABS: 50.0000 mg | ORAL_TABLET | Freq: Every day | ORAL | 0 refills | Status: DC

## 2021-02-25 MED ORDER — VITAMIN D3 25 MCG (1000 UT) PO CAPS: 1000.0000 [IU] | ORAL_CAPSULE | Freq: Every day | ORAL | 0 refills | Status: AC

## 2021-02-25 MED ORDER — METHOCARBAMOL 500 MG PO TABS: 500.0000 mg | ORAL_TABLET | Freq: Three times a day (TID) | ORAL | 0 refills | Status: DC | PRN

## 2021-02-25 NOTE — Progress Notes (Signed)
Physical Therapy TBI Note  Patient Details  Name: Brandon Burke MRN: [id number] Date of Birth: 04-08-1932  Today's Date: 02/25/2021 PT Individual Time: 0539-7673 and 1350-1429 PT Individual Time Calculation (min): 55 min and 39 min  Short Term Goals: Week 2:  PT Short Term Goal 1 (Week 2): STGs = LTGs  Skilled Therapeutic Interventions/Progress Updates:     1st Session: Pt received seated in recliner and agrees to therapy. No complaint of pain. PT answers several of pt's wife's questions regarding mobility following discharge. Pt performs sit to stand to RW with cues for hand placement and sequencing. Pt performs ambulatory transfer to [facility] with close supervision and cues for RW management and positioning. WC transport to gym for time management. Pt performs gait training with RW, with PT providing very close supervision. Pt ambulates x200' with RW and verbal cues to maintain upright gaze to improve posture and balance. When turning, pt demonstrates poor motor planning and increased postural sway, but is able to maintain balance with support of RW and no additional physical assistance needed. Pt then performs obstacle course, ambulating and "weaving" in and out of cones and performing 180 degree turn midway through course. Pt completes without physical assistance but has forward flexed posture with posterior bias throughout. PT provides demonstration of optimal posture and gait pattern and pt performs 2nd bout, with slightly improved posture and gait pattern, but requiring additional cueing to follow instructions, as he forgot to sit back in [facility] at end of course. Pt also shows tendency to "overshoot" WC when approaching with RW, then having to turn and back up to safely sit in seat.  Pt performs activity in tall kneeling to engage core and work on balance, with cognitive challenge overlay of sorting tiles by color. Pt is able to "crawl" forward onto mat table with CGA following PT demonstration,  and PT provides verbal and tactile cues for posture while pt performs sorting activity. Pt requires mod verbal cues for correct performance of sorting task. Following seated rest break, pt performs additional sorting activity in tall kneeling, tasked with sorting tiles by shape. Initially pt has difficulty with task but shows improvement when sorting familiar shapes, such as a "circle", or a "square", rather than more obscure shapes like a "clover". Pt is able to maintain tall kneeling throughout activity, >5 minutes. WC transport back to room. Pt performs stand step transfer to recliner with RW and CGA. Left seated with alarm intact and all needs within reach.  2nd Session: Pt received supine in bed and agrees to therapy. No complaint of pain. Supine to sit mod(I) with bed features. Sit to stand with RW and CGA, with pt attempting to stand prior to being cued and requiring additional cueing for safety awareness. Pt ambulates x150' to gym with RW and verbal cues to maintain upright gaze to improve posture and balance. Pt then performs transfer training, tasked with ambulating 10-15 feet with RW and then managing RW safely to turn and sit in chair facing pt. Task progressed by giving pt multistep instruction, asked to walk around opposite chair and return to seated position in original chair. Pt is able to complete with verbal cues to reach back and use arms to assist with lower down to chair for safety. Pt then given 3 step instruction, and requires mod cueing to remember steps, but managing RW safely without assistance. Pt attempts 3-step activities several more times, with PT demonstrating desired task, and pt consistently requires mod cueing to recall  sequence. PT also provides verbal cues for safe RW management. WC transport back to room. Stand step transfer to recliner with cues for positioning. Left seated with alarm intact and all needs within reach.  Therapy Documentation Precautions:   Precautions Precautions: Fall Restrictions Weight Bearing Restrictions: No Agitated Behavior Scale: TBI Observation Details Observation Environment: pt room Start of observation period - Date: 02/25/21 Start of observation period - Time: 0900 End of observation period - Date: 02/25/21 End of observation period - Time: 1000 Agitated Behavior Scale (DO NOT LEAVE BLANKS) Short attention span, easy distractibility, inability to concentrate: Present to a slight degree Impulsive, impatient, low tolerance for pain or frustration: Absent Uncooperative, resistant to care, demanding: Absent Violent and/or threatening violence toward people or property: Absent Explosive and/or unpredictable anger: Absent Rocking, rubbing, moaning, or other self-stimulating behavior: Absent Pulling at tubes, restraints, etc.: Absent Wandering from treatment areas: Absent Restlessness, pacing, excessive movement: Present to a slight degree Repetitive behaviors, motor, and/or verbal: Absent Rapid, loud, or excessive talking: Absent Sudden changes of mood: Absent Easily initiated or excessive crying and/or laughter: Absent Self-abusiveness, physical and/or verbal: Absent Agitated behavior scale total score: 16    Therapy/Group: Individual Therapy  Breck Coons, PT, DPT 02/25/2021, 4:01 PM

## 2021-02-25 NOTE — Plan of Care (Signed)
  Problem: Consults Goal: RH BRAIN INJURY PATIENT EDUCATION Description: Description: See Patient Education module for eduction specifics Outcome: Progressing   Problem: RH BOWEL ELIMINATION Goal: RH STG MANAGE BOWEL WITH ASSISTANCE Description: STG Manage Bowel with Supervision Assistance. Outcome: Progressing Goal: RH STG MANAGE BOWEL W/MEDICATION W/ASSISTANCE Description: STG Manage Bowel with Medication with Supervision Assistance. Outcome: Progressing   Problem: RH SKIN INTEGRITY Goal: RH STG MAINTAIN SKIN INTEGRITY WITH ASSISTANCE Description: STG Maintain Skin Integrity With Supervision Assistance. Outcome: Progressing Goal: RH STG ABLE TO PERFORM INCISION/WOUND CARE W/ASSISTANCE Description: STG Able To Perform Incision/Wound Care With Supervision Assistance. Outcome: Progressing   Problem: RH SAFETY Goal: RH STG ADHERE TO SAFETY PRECAUTIONS W/ASSISTANCE/DEVICE Description: STG Adhere to Safety Precautions With Supervision Assistance/Device. Outcome: Progressing Goal: RH STG DECREASED RISK OF FALL WITH ASSISTANCE Description: STG Decreased Risk of Fall With Supervision Assistance. Outcome: Progressing   Problem: RH COGNITION-NURSING Goal: RH STG USES MEMORY AIDS/STRATEGIES W/ASSIST TO PROBLEM SOLVE Description: STG Uses Memory Aids/Strategies With Supervision Assistance to Problem Solve. Outcome: Progressing Goal: RH STG ANTICIPATES NEEDS/CALLS FOR ASSIST W/ASSIST/CUES Description: STG Anticipates Needs/Calls for Assist With Supervision Assistance/Cues. Outcome: Progressing   Problem: RH PAIN MANAGEMENT Goal: RH STG PAIN MANAGED AT OR BELOW PT'S PAIN GOAL Description: < 2 on a 0-10 pain scale. Outcome: Progressing   Problem: RH KNOWLEDGE DEFICIT BRAIN INJURY Goal: RH STG INCREASE KNOWLEDGE OF SELF CARE AFTER BRAIN INJURY Description: Patient will demonstrate knowledge of medication management, pain management, bowel management with educational materials and  handouts provided by staff independently at discharge. Outcome: Progressing

## 2021-02-25 NOTE — Progress Notes (Signed)
Speech Language Pathology TBI Note  Patient Details  Name: Brandon Burke MRN: [id number] Date of Birth: [birth year]  Today's Date: 02/25/2021 SLP Individual Time: 1445-1530 SLP Individual Time Calculation (min): 45 min  Short Term Goals: Week 2: SLP Short Term Goal 1 (Week 2): STGs=LTGs due to ELOS  Skilled Therapeutic Interventions: Pt received upright in recliner chair and agreeable to skilled ST intervention with focus on cognitive goals. Pt was oriented to person/place/situation and mo/yr without cues. Pt displayed confusion about where he currently lives. Family continues to provide verbal reinforcement that patient lives in [place] but patient believes he also lives in [place] which is not accurate. Pt displayed intellectual awareness of his cognitive deficits and memory challenges and reported being unable to recall timeline of events, death of friends/family, etc which is bothersome to him. SLP educated family on strategies to maximize recall and orientation through verbal/visual reinforcement such as developing a visual timeline of important life events, looking through old photo albums to identify/recall people/places/events/memories and providing written reinforcement as needed. Family verbalized understanding. SLP also facilitated session by providing mod A fading to min-to-mod A verbal/visual cues for sustained attention, basic problem solving/reasoning, and recall of task instructions with novel card game Blink. Pt required increased verbal redirection toward end of session due to internal distractibility and perseverated on not understanding why time on a clock is constantly changing. Patient was transferred to bed with CGA at end of session with bed alarm activated and immediate needs within reach at end of session. Continue per current plan of care.      Pain Pain Assessment Pain Scale: 0-10 Pain Score: 0-No pain  Agitated Behavior Scale: TBI Observation  Details Observation Environment: pt room Start of observation period - Date: 02/25/21 Start of observation period - Time: 1445 End of observation period - Date: 02/25/21 End of observation period - Time: 1530 Agitated Behavior Scale (DO NOT LEAVE BLANKS) Short attention span, easy distractibility, inability to concentrate: Present to a slight degree Impulsive, impatient, low tolerance for pain or frustration: Absent Uncooperative, resistant to care, demanding: Absent Violent and/or threatening violence toward people or property: Absent Explosive and/or unpredictable anger: Absent Rocking, rubbing, moaning, or other self-stimulating behavior: Absent Pulling at tubes, restraints, etc.: Absent Wandering from treatment areas: Absent Restlessness, pacing, excessive movement: Absent Repetitive behaviors, motor, and/or verbal: Present to a slight degree Rapid, loud, or excessive talking: Present to a slight degree Sudden changes of mood: Absent Easily initiated or excessive crying and/or laughter: Absent Self-abusiveness, physical and/or verbal: Absent Agitated behavior scale total score: 17  Therapy/Group: Individual Therapy  Patty Sermons 02/25/2021, 4:23 PM

## 2021-02-25 NOTE — Progress Notes (Signed)
Occupational Therapy TBI Note  Patient Details  Name: Brandon Burke MRN: [id number] Date of Birth: 07-02-1931  Today's Date: 02/25/2021 OT Individual Time: 9987-2158 OT Individual Time Calculation (min): 53 min    Short Term Goals: Week 1:  OT Short Term Goal 1 (Week 1): Patient will complete UB dressing with Min A OT Short Term Goal 1 - Progress (Week 1): Met OT Short Term Goal 2 (Week 1): Patient will ambulate to the bathroom with min A OT Short Term Goal 2 - Progress (Week 1): Met OT Short Term Goal 3 (Week 1): Patient will sequence 2 steps of grooming task with min questioning cues OT Short Term Goal 3 - Progress (Week 1): Met  Skilled Therapeutic Interventions/Progress Updates:    Session 1:  Pt received in bed with "mild" low back pain. Repositioning provided for pain relief   ADL: Pt completes ADL at overall Supervision Level. Skilled interventions include: Vc for RW management into and out of bathroom and over toilet for standing bladder void, VC for scanning to locate needed ADL items at sink, increased time for processing cues to not over cue, and general orientation.  Pt completes toielting with S at AMB level with RW, grooming with S seated for energy conservation, UB dressing with set up for doffing and donning seated, and LB dressing with S to change pants/socks and shoes with VC for seated positioning to [employer].  Therapeutic activity BITS activities: Standing visual scanning 1-26  6.3 sec reaction time and 2 min 44 sec Standing visual pursuits 1-25 with 5 cues/instances of pt unable to recall where he was in the sequence. 13.27 reaction time and 5 min 32 sec Standing immediate recall of memory words/picture: sequence up to  5. Pt states, "good thing it ran out of time, I was running out of memory"  Pt left at end of session in bed with exit alarm on, call light in reach and all needs met  Therapy Documentation Precautions:  Precautions Precautions:  Fall Restrictions Weight Bearing Restrictions: No General:   Agitated Behavior Scale: TBI Observation Details Observation Environment: pt room Start of observation period - Date: 02/25/21 Start of observation period - Time: 0900 End of observation period - Date: 02/25/21 End of observation period - Time: 1000 Agitated Behavior Scale (DO NOT LEAVE BLANKS) Short attention span, easy distractibility, inability to concentrate: Present to a slight degree Impulsive, impatient, low tolerance for pain or frustration: Absent Uncooperative, resistant to care, demanding: Absent Violent and/or threatening violence toward people or property: Absent Explosive and/or unpredictable anger: Absent Rocking, rubbing, moaning, or other self-stimulating behavior: Absent Pulling at tubes, restraints, etc.: Absent Wandering from treatment areas: Absent Restlessness, pacing, excessive movement: Present to a slight degree Repetitive behaviors, motor, and/or verbal: Absent Rapid, loud, or excessive talking: Absent Sudden changes of mood: Absent Easily initiated or excessive crying and/or laughter: Absent Self-abusiveness, physical and/or verbal: Absent Agitated behavior scale total score: 16     Therapy/Group: Individual Therapy  Tonny Branch 02/25/2021, 10:00 AM

## 2021-02-25 NOTE — Progress Notes (Signed)
Patient ID: Brandon Burke, male   DOB: 12-28-1931, 87 y.o.   MRN: [id number]  SW faxed HHPT/OT/SLP referral [facility] [phone]: [phone]).  Loralee Pacas, MSW, [place] Office: [phone] Cell: [phone] Fax: [phone]

## 2021-02-25 NOTE — Progress Notes (Signed)
PROGRESS NOTE   Subjective/Complaints: Patient seen sitting up in her chair this morning.  He states he slept well overnight.  He is working with therapies.  He is confused this morning.  ROS:?  Reliability due to cognition, denies CP, SOB, N/V/D  Objective:   No results found. Recent Labs    02/23/21 0527  WBC 5.6  HGB 12.7*  HCT 37.1*  PLT 196     Recent Labs    02/23/21 0527  NA 139  K 3.8  CL 108  CO2 26  GLUCOSE 96  BUN 23  CREATININE 1.10  CALCIUM 8.9      Intake/Output Summary (Last 24 hours) at 02/25/2021 1048 Last data filed at 02/25/2021 0800 Gross per 24 hour  Intake 200 ml  Output --  Net 200 ml         Physical Exam: Vital Signs Blood pressure (!) 134/59, pulse 83, temperature 98.9 F (37.2 C), temperature source Oral, resp. rate 18, height 6' 0.01" (1.829 m), weight 77.6 kg, SpO2 98 %. Constitutional: No distress . Vital signs reviewed. HENT: Normocephalic.  Atraumatic. Eyes: EOMI. No discharge. Cardiovascular: No JVD.  RRR. Respiratory: Normal effort.  No stridor.  Bilateral clear to auscultation. GI: Non-distended.  BS +. Skin: Warm and dry.  Intact. Psych: Normal mood.  Pleasantly confused. Musc: No edema in extremities.  No tenderness in extremities. Neuro: Alert and oriented x1 Motor: Grossly 4+/5 throughout, unchanged  Assessment/Plan: 1. Functional deficits which require 3+ hours per day of interdisciplinary therapy in a comprehensive inpatient rehab setting. Physiatrist is providing close team supervision and 24 hour management of active medical problems listed below. Physiatrist and rehab team continue to assess barriers to discharge/monitor patient progress toward functional and medical goals  Care Tool:  Bathing    Body parts bathed by patient: Right arm, Left arm, Chest, Abdomen, Front perineal area, Buttocks, Face, Left lower leg, Right lower leg, Left upper leg,  Right upper leg   Body parts bathed by helper: Right lower leg, Left lower leg     Bathing assist Assist Level: Contact Guard/Touching assist     Upper Body Dressing/Undressing Upper body dressing   What is the patient wearing?: Pull over shirt    Upper body assist Assist Level: Set up assist    Lower Body Dressing/Undressing Lower body dressing      What is the patient wearing?: Pants     Lower body assist Assist for lower body dressing: Contact Guard/Touching assist     Toileting Toileting Toileting Activity did not occur (Clothing management and hygiene only): N/A (no void or bm)  Toileting assist Assist for toileting: Supervision/Verbal cueing     Transfers Chair/bed transfer  Transfers assist  Chair/bed transfer activity did not occur: Safety/medical concerns (Pt unable to maintian alertness)  Chair/bed transfer assist level: Minimal Assistance - Patient > 75%     Locomotion Ambulation   Ambulation assist      Assist level: Minimal Assistance - Patient > 75% Assistive device: No Device Max distance: 13ft   Walk 10 feet activity   Assist     Assist level: Contact Guard/Touching assist Assistive device: No Device  Walk 50 feet activity   Assist    Assist level: Contact Guard/Touching assist Assistive device: No Device    Walk 150 feet activity   Assist Walk 150 feet activity did not occur: Safety/medical concerns  Assist level: Contact Guard/Touching assist Assistive device: No Device    Walk 10 feet on uneven surface  activity   Assist     Assist level: Contact Guard/Touching assist Assistive device: Other (comment)   Wheelchair     Assist Is the patient using a wheelchair?: Yes Type of Wheelchair: Manual    Wheelchair assist level: Total Assistance - Patient < 25% Max wheelchair distance: 150    Wheelchair 50 feet with 2 turns activity    Assist        Assist Level: Total Assistance - Patient < 25%    Wheelchair 150 feet activity     Assist      Assist Level: Total Assistance - Patient < 25%   Blood pressure (!) 134/59, pulse 83, temperature 98.9 F (37.2 C), temperature source Oral, resp. rate 18, height 6' 0.01" (1.829 m), weight 77.6 kg, SpO2 98 %.  Medical Problem List and Plan: 1.  TBI/concussion secondary to fall 02/03/2021.  Cranial CT scan negative.  Continue CIR 2.  Antithrombotics: -DVT/anticoagulation:   Eliquis             -antiplatelet therapy: N/A 3. Pain: continue Lidoderm patch, Robaxin as needed, Ultram as needed             Monitor with increased exertion 4. Insomnia: increased seroquel to 50mg . Discussed with wife that this can also help with agitation/sundowning and she is agreeable- patient has been calling her at night               -antipsychotic agents: seroquel at HS with PRN dose  -maintain sleep chart  -reduce stimuli at night (TV)/behavioral plan  -continue ritalin to help with arousal, attention and focus during the day  -Staph Epi UTI: treated  Telemetry sitter for safety 5. Neuropsych: This patient is not capable of making decisions on his own behalf. 6. Skin/Wound Care: Left facial laceration or abrasion.  Sutures removed.  Routine skin checks 7. Fluids/Electrolytes/Nutrition:              -encourage PO's  - protein supp for low albumin  - no offending meds    BMP Latest Ref Rng & Units 02/23/2021 02/20/2021 02/19/2021  Glucose 70 - 99 mg/dL 96 94 102(H)  BUN 8 - 23 mg/dL 23 20 29(H)  Creatinine 0.61 - 1.24 mg/dL 1.10 1.07 1.25(H)  Sodium 135 - 145 mmol/L 139 139 140  Potassium 3.5 - 5.1 mmol/L 3.8 3.6 3.9  Chloride 98 - 111 mmol/L 108 111 108  CO2 22 - 32 mmol/L 26 24 25   Calcium 8.9 - 10.3 mg/dL 8.9 8.6(L) 9.1    8.  Multiple left rib fractures with extrapleural hematoma.  Conservative management 9.  Density right middle lung lobe.  Incidental on CT.  Follow-up outpatient 10.  History of DVT/pulmonary emboli.  Eliquis 11.   Essential hypertension: Tenormin 50 mg daily   Vitals:   02/25/21 0322 02/25/21 0847  BP: 105/83 (!) 134/59  Pulse: 81 83  Resp: 18   Temp: 98.9 F (37.2 C)   SpO2: 98%    Labile on 10/26, avoid hypotension 12.  BPH.  Flomax 0.4 mg daily.    Improved 13.  Hyperlipidemia.  Pravachol 14.  Hypothyroidism.  Synthroid 15.  Drug-induced  constipation.  MiraLAX daily, Colace 100 mg twice daily             Improving 16.  Transaminitis: Resolved 17. Acute blood loss anemia  Hb 12.7 on 10/24  Cont to monitor  LOS: 14 days A FACE TO FACE EVALUATION WAS PERFORMED  Aleli Navedo Brandon Burke 02/25/2021, 10:48 AM

## 2021-02-26 NOTE — Progress Notes (Addendum)
Patient ID: Brandon Burke, male   DOB: May 11, 1931, 87 y.o.   MRN: [id number]  SW followed up with [employer] [id number]: [phone]) to discuss referral; SW left message and waiting on follow-up. *SW received updates from [facility] referral received. Will follow-up with pt on Monday to discuss scheduling appointment. SW met with pt, pt wife, and brother to discuss above. Reports pt has a RW, so SW does not need to order.   Loralee Pacas, MSW, [place] Office: [phone] Cell: [phone] Fax: [phone]

## 2021-02-26 NOTE — Progress Notes (Signed)
Speech Language Pathology TBI Note  Patient Details  Name: Brandon Burke MRN: [id number] Date of Birth: 09-30-1931  Today's Date: 02/26/2021 SLP Individual Time: 1015-1055 SLP Individual Time Calculation (min): 40 min  Short Term Goals: Week 2: SLP Short Term Goal 1 (Week 2): STGs=LTGs due to ELOS  Skilled Therapeutic Interventions: Skilled treatment session focused on completion of patient and family education with the patient, his wife, his brother, and sister-in-law. SLP facilitated session by providing education regarding patient's current cognitive functioning and strategies to utilize at home to maximize attention, recall, problem solving and overall safety at home. SLP reinforced the importance of 24 hour supervision at home and help generate a list of safe activities to participate in at home. Patient and family verbalized understanding. Patient left upright in recliner with alarm on and all needs within reach. Continue with current plan of care.      Pain No/Denies Pain   Agitated Behavior Scale: TBI Observation Details Observation Environment: patient's room Start of observation period - Date: 02/26/21 Start of observation period - Time: 1015 End of observation period - Date: 02/26/21 End of observation period - Time: 1055 Agitated Behavior Scale (DO NOT LEAVE BLANKS) Short attention span, easy distractibility, inability to concentrate: Present to a slight degree Impulsive, impatient, low tolerance for pain or frustration: Absent Uncooperative, resistant to care, demanding: Absent Violent and/or threatening violence toward people or property: Absent Explosive and/or unpredictable anger: Absent Rocking, rubbing, moaning, or other self-stimulating behavior: Absent Pulling at tubes, restraints, etc.: Absent Wandering from treatment areas: Absent Restlessness, pacing, excessive movement: Absent Repetitive behaviors, motor, and/or verbal: Present to a slight  degree Rapid, loud, or excessive talking: Present to a slight degree Sudden changes of mood: Absent Easily initiated or excessive crying and/or laughter: Absent Self-abusiveness, physical and/or verbal: Absent Agitated behavior scale total score: 17  Therapy/Group: Individual Therapy  Dashia Caldeira 02/26/2021, 12:08 PM

## 2021-02-26 NOTE — Progress Notes (Signed)
Physical Therapy TBI Note  Patient Details  Name: Brandon Burke MRN: [id number] Date of Birth: Mar 15, 1932  Today's Date: 02/26/2021 PT Individual Time: 1105-1200 and 1350-1430 PT Individual Time Calculation (min): 55 min and 40 min  Short Term Goals: Week 2:  PT Short Term Goal 1 (Week 2): STGs = LTGs  Skilled Therapeutic Interventions/Progress Updates:      1st Session: Pt received seated in recliner and agrees to therapy. No complaint of pain. Family present for family education. PT provides brief overview of pt and pt's deficits and explains to family that pt requires 24/7 supervision at this time. WC transport to gym for time management. Pt performs car transfer following PT demonstration, with CGA provided by brother and repeated cueing not to grab door as pt attempts to do so multiple times. Following seated rest break, pt ambulates x300' with brother providing CGA and PT cueing for upright gaze to improve posture and balance, and increasing gait speed to decrease risk for falls. Following seated rest break, pt performs Nustep activity for 15:00 at workload of 4 with average steps per minute ~55. PT has discussion with pt's wife and sister in law regarding recommendation for 24/7 supervision, as well as recommendation that pt's brother and sister in law be present for first several weeks that pt is home due to level of care required. Wife somewhat resistant to accepting outside help but PT explains that pt's cognitive and balance deficits are substantial and require more than 1 person, if available. Wife emotional but eventually agreeable to accepting family's offer for assistance. Pt ambulates back to room without AD and with CGA and cues for navigation for safety. Left seated in recliner with alarm intact and all needs within reach.  2nd session: Pt received seated in recliner and agrees to therapy. No complaint of pain. Stand step transfer to [facility] with CGA and cues for sequencing and  positioning. WC transport to gym for time management. Pt performs BERG balance test, as detailed below, demonstrating 9 point improvement from previous effort. Pt then performs kinetron activity for bilateral strengthening and coordination training. Pt completes for 10 minutes with cues for correct performance. During activity, PT has active discussion with pt's brother regarding DC and plans for safe mobility. WC transport back to room. Stand step back to recliner. Left seated with alarm intact and all needs within reach.  Therapy Documentation Precautions:  Precautions Precautions: Fall Restrictions Weight Bearing Restrictions: No Agitated Behavior Scale: TBI Observation Details Observation Environment: Patient's room Start of observation period - Date: 02/26/21 Start of observation period - Time: 1345 End of observation period - Date: 02/26/21 End of observation period - Time: 1430 Agitated Behavior Scale (DO NOT LEAVE BLANKS) Short attention span, easy distractibility, inability to concentrate: Present to a slight degree Impulsive, impatient, low tolerance for pain or frustration: Absent Uncooperative, resistant to care, demanding: Absent Violent and/or threatening violence toward people or property: Absent Explosive and/or unpredictable anger: Absent Rocking, rubbing, moaning, or other self-stimulating behavior: Absent Pulling at tubes, restraints, etc.: Absent Wandering from treatment areas: Absent Restlessness, pacing, excessive movement: Absent Repetitive behaviors, motor, and/or verbal: Present to a slight degree Rapid, loud, or excessive talking: Absent Sudden changes of mood: Absent Easily initiated or excessive crying and/or laughter: Absent Self-abusiveness, physical and/or verbal: Absent Agitated behavior scale total score: 16 Balance: Standardized Balance Assessment Standardized Balance Assessment: Berg Balance Test Berg Balance Test Sit to Stand: Able to stand  without using hands and stabilize independently Standing Unsupported: Able  to stand 2 minutes with supervision Sitting with Back Unsupported but Feet Supported on Floor or Stool: Able to sit safely and securely 2 minutes Stand to Sit: Sits safely with minimal use of hands Transfers: Able to transfer with verbal cueing and /or supervision Standing Unsupported with Eyes Closed: Able to stand 10 seconds with supervision Standing Ubsupported with Feet Together: Able to place feet together independently and stand for 1 minute with supervision From Standing, Reach Forward with Outstretched Arm: Can reach forward >12 cm safely (5") From Standing Position, Pick up Object from Floor: Able to pick up shoe, needs supervision From Standing Position, Turn to Look Behind Over each Shoulder: Looks behind from both sides and weight shifts well Turn 360 Degrees: Needs close supervision or verbal cueing Standing Unsupported, Alternately Place Feet on Step/Stool: Needs assistance to keep from falling or unable to try Standing Unsupported, One Foot in Front: Able to plae foot ahead of the other independently and hold 30 seconds Standing on One Leg: Tries to lift leg/unable to hold 3 seconds but remains standing independently Total Score: 38    Therapy/Group: Individual Therapy  Breck Coons, PT, DPT 02/26/2021, 4:06 PM

## 2021-02-26 NOTE — Progress Notes (Signed)
PROGRESS NOTE   Subjective/Complaints: Patient seen sitting up in bed this morning using his electric shaver.  Family at bedside.  He states he did not sleep well overnight, "just 1 of those nights".  Family with questions regarding discharge instructions.  ROS:? Reliability due to cognition, denies CP, SOB, N/V/D  Objective:   No results found. No results for input(s): WBC, HGB, HCT, PLT in the last 72 hours.   No results for input(s): NA, K, CL, CO2, GLUCOSE, BUN, CREATININE, CALCIUM in the last 72 hours.    Intake/Output Summary (Last 24 hours) at 02/26/2021 1041 Last data filed at 02/25/2021 2017 Gross per 24 hour  Intake 440 ml  Output 150 ml  Net 290 ml         Physical Exam: Vital Signs Blood pressure (!) 104/56, pulse 78, temperature 98.3 F (36.8 C), resp. rate 16, height 6' 0.01" (1.829 m), weight 78.5 kg, SpO2 98 %. Constitutional: No distress . Vital signs reviewed. HENT: Normocephalic.  Atraumatic. Eyes: EOMI. No discharge. Cardiovascular: No JVD.  RRR. Respiratory: Normal effort.  No stridor.  Bilateral clear to auscultation. GI: Non-distended.  BS +. Skin: Warm and dry.  Intact. Psych: Normal mood.  Pleasantly confused. Musc: No edema in extremities.  No tenderness in extremities. Neuro: Alert and oriented x1 Motor: Grossly 4+/5 throughout, stable  Assessment/Plan: 1. Functional deficits which require 3+ hours per day of interdisciplinary therapy in a comprehensive inpatient rehab setting. Physiatrist is providing close team supervision and 24 hour management of active medical problems listed below. Physiatrist and rehab team continue to assess barriers to discharge/monitor patient progress toward functional and medical goals  Care Tool:  Bathing    Body parts bathed by patient: Right arm, Left arm, Chest, Abdomen, Front perineal area, Buttocks, Face, Left lower leg, Right lower leg, Left  upper leg, Right upper leg   Body parts bathed by helper: Right lower leg, Left lower leg     Bathing assist Assist Level: Contact Guard/Touching assist     Upper Body Dressing/Undressing Upper body dressing   What is the patient wearing?: Pull over shirt    Upper body assist Assist Level: Set up assist    Lower Body Dressing/Undressing Lower body dressing      What is the patient wearing?: Pants     Lower body assist Assist for lower body dressing: Contact Guard/Touching assist     Toileting Toileting Toileting Activity did not occur (Clothing management and hygiene only): N/A (no void or bm)  Toileting assist Assist for toileting: Supervision/Verbal cueing     Transfers Chair/bed transfer  Transfers assist  Chair/bed transfer activity did not occur: Safety/medical concerns (Pt unable to maintian alertness)  Chair/bed transfer assist level: Minimal Assistance - Patient > 75%     Locomotion Ambulation   Ambulation assist      Assist level: Minimal Assistance - Patient > 75% Assistive device: No Device Max distance: 132ft   Walk 10 feet activity   Assist     Assist level: Contact Guard/Touching assist Assistive device: No Device   Walk 50 feet activity   Assist    Assist level: Contact Guard/Touching assist Assistive device: No  Device    Walk 150 feet activity   Assist Walk 150 feet activity did not occur: Safety/medical concerns  Assist level: Contact Guard/Touching assist Assistive device: No Device    Walk 10 feet on uneven surface  activity   Assist     Assist level: Contact Guard/Touching assist Assistive device: Other (comment)   Wheelchair     Assist Is the patient using a wheelchair?: Yes Type of Wheelchair: Manual    Wheelchair assist level: Total Assistance - Patient < 25% Max wheelchair distance: 150    Wheelchair 50 feet with 2 turns activity    Assist        Assist Level: Total Assistance -  Patient < 25%   Wheelchair 150 feet activity     Assist      Assist Level: Total Assistance - Patient < 25%   Blood pressure (!) 104/56, pulse 78, temperature 98.3 F (36.8 C), resp. rate 16, height 6' 0.01" (1.829 m), weight 78.5 kg, SpO2 98 %.  Medical Problem List and Plan: 1.  TBI/concussion secondary to fall 02/03/2021.  Cranial CT scan negative.  Continue CIR 2.  Antithrombotics: -DVT/anticoagulation:   Eliquis             -antiplatelet therapy: N/A 3. Pain: continue Lidoderm patch, Robaxin as needed, Ultram as needed             Monitor with increased exertion 4. Insomnia: increased seroquel to 50mg . Discussed with wife that this can also help with agitation/sundowning and she is agreeable- patient has been calling her at night               -antipsychotic agents: seroquel at HS with PRN dose  -maintain sleep chart  -reduce stimuli at night (TV)/behavioral plan  -continue ritalin to help with arousal, attention and focus during the day  -Staph Epi UTI: treated  Telemetry sitter for safety 5. Neuropsych: This patient is not capable of making decisions on his own behalf. 6. Skin/Wound Care: Left facial laceration or abrasion.  Sutures removed.  Routine skin checks 7. Fluids/Electrolytes/Nutrition:              -encourage PO's  - protein supp for low albumin  - no offending meds    BMP Latest Ref Rng & Units 02/23/2021 02/20/2021 02/19/2021  Glucose 70 - 99 mg/dL 96 94 102(H)  BUN 8 - 23 mg/dL 23 20 29(H)  Creatinine 0.61 - 1.24 mg/dL 1.10 1.07 1.25(H)  Sodium 135 - 145 mmol/L 139 139 140  Potassium 3.5 - 5.1 mmol/L 3.8 3.6 3.9  Chloride 98 - 111 mmol/L 108 111 108  CO2 22 - 32 mmol/L 26 24 25   Calcium 8.9 - 10.3 mg/dL 8.9 8.6(L) 9.1    8.  Multiple left rib fractures with extrapleural hematoma.  Conservative management 9.  Density right middle lung lobe.  Incidental on CT.  Follow-up outpatient 10.  History of DVT/pulmonary emboli.  Eliquis 11.  Essential  hypertension: Tenormin 50 mg daily   Vitals:   02/26/21 0426 02/26/21 0754  BP: (!) 102/53 (!) 104/56  Pulse: 82 78  Resp: 17 16  Temp: 98.2 F (36.8 C) 98.3 F (36.8 C)  SpO2: 98%    Soft on 10/27 12.  BPH.  Flomax 0.4 mg daily.    Improved 13.  Hyperlipidemia.  Pravachol 14.  Hypothyroidism: Synthroid 15.  Drug-induced constipation.  MiraLAX daily, Colace 100 mg twice daily  Improving 16.  Transaminitis: Resolved 17. Acute blood loss anemia  Hb 12.7 on 10/24  Cont to monitor  LOS: 15 days A FACE TO FACE EVALUATION WAS PERFORMED  Tammy Wickliffe Lorie Phenix 02/26/2021, 10:41 AM

## 2021-02-26 NOTE — Progress Notes (Addendum)
Occupational Therapy TBI Note  Patient Details  Name: Brandon Burke MRN: [id number] Date of Birth: June 11, 1931  Today's Date: 02/26/2021 OT Individual Time: 0903-1000 OT Individual Time Calculation (min): 57 min    Short Term Goals: Week 2:  OT Short Term Goal 1 (Week 2): LTG=STG 2/2 ELOS  Skilled Therapeutic Interventions/Progress Updates:    Pt greeted semi-reclined in bed with spouse, brother, and sister-in-law present. OT educated extensively on safety within BADL tasks and pt's high risk of falls. Educated spouse on how to cue patient for safety, especially when turning or trying to sit down on a surface. Pt more confused today and with difficulty pathfinding requiring increased time and max cues to safely fine wc, recliner, or shower seat. Spouse provided CGA/supervision for functional BADL tasks with education provided throughout on maintaining caregiver safety and cuing. Re-iterated pt should not be left alone for any amount of time and meals should be delivered to their room from cafeteria and [employer]. Pt's brother stated him and his wife are retired and will also be provided assistance as needed. Pt left seated in recliner at end of session with family present and needs met.   Therapy Documentation Precautions:  Precautions Precautions: Fall Restrictions Weight Bearing Restrictions: No Pain: Pain Assessment Pain Scale: 0-10 Pain Score: 0-No pain    02/26/21 1130  Observation Details  Observation Environment Room  Start of observation period - Date 02/26/21  Start of observation period - Time 1000  End of observation period - Date 02/26/21  End of observation period - Time 1100  Agitated Behavior Scale (DO NOT LEAVE BLANKS)  Short attention span, easy distractibility, inability to concentrate 2  Impulsive, impatient, low tolerance for pain or frustration 1  Uncooperative, resistant to care, demanding 1  Violent and/or threatening violence toward people or property 1   Explosive and/or unpredictable anger 1  Rocking, rubbing, moaning, or other self-stimulating behavior 1  Pulling at tubes, restraints, etc. 1  Wandering from treatment areas 1  Restlessness, pacing, excessive movement 1  Repetitive behaviors, motor, and/or verbal 2  Rapid, loud, or excessive talking 2  Sudden changes of mood 1  Easily initiated or excessive crying and/or laughter 1  Self-abusiveness, physical and/or verbal 1  Agitated behavior scale total score 17    Therapy/Group: Individual Therapy  Valma Cava 02/26/2021, 10:05 AM

## 2021-02-27 NOTE — Progress Notes (Signed)
Physical Therapy TBI Note  Patient Details  Name: Brandon Burke MRN: [id number] Date of Birth: 1932/02/14  Today's Date: 02/27/2021 PT Individual Time: 1118-1200 PT Individual Time Calculation (min): 42 min   Short Term Goals: Week 1:  PT Short Term Goal 1 (Week 1): Pt will perform supine to/from sit w/min assist PT Short Term Goal 1 - Progress (Week 1): Met PT Short Term Goal 2 (Week 1): Pt will perform basic transfers w/cga and minimal cueing for safety PT Short Term Goal 2 - Progress (Week 1): Met PT Short Term Goal 3 (Week 1): Pt will ambulate 165f w/cga to min assist in environment w/minimal to no distractions and no AD PT Short Term Goal 3 - Progress (Week 1): Met PT Short Term Goal 4 (Week 1): Pt will ambulat >1552fw/LRAD in busy environment including turns w/cga PT Short Term Goal 4 - Progress (Week 1): Progressing toward goal Week 2:  PT Short Term Goal 1 (Week 2): STGs = LTGs   Skilled Therapeutic Interventions/Progress Updates:  Patient seated in recliner on entrance to room. Wife and brother present in room. Patient alert and agreeable to PT session. Pt is able to don shoes with setup.   Patient with no pain complaint throughout session.  Therapeutic Activity: Transfers: Patient performed sit<>stand and stand pivot transfers throughout session with supervision requiring no assist and demonstrating good attainment of balance. No vc required for technique.  Gait Training/NMR:  Patient ambulated >200 ft x1/ >270 ft x1 using RW with supervision. Demonstrated good bilateral foot clearance and provided vc for level gaze. Guided pt in dynamic balance training with ambulation over low obstacles. Guided in shifting of single leg balance in order to clear obstacle and maintain balance. Requires vc initially to clear and then is able to perform with no vc. Pauses prior to each obstacle in order to setup step over. No LOB. Completes 2x5 obstacles. Also guided in toe touches to  6-in steps and progressed to forward minilunges to second step. Initiated with BUE support and progressed to UUE support with good balance noted.   Patient seated  in recliner at end of session with brakes locked, seat alarm set, and all needs within reach. Family asked if they have any questions re: pt's upcoming d/c. [facility] and to progress to Neuro specific OP services for continued neuro focused therapy to continue progress with balance and mobility.      Therapy Documentation Precautions:  Precautions Precautions: Fall Restrictions Weight Bearing Restrictions: No General:   Vital Signs: Therapy Vitals Pulse Rate: 74 BP: (!) 128/57 Pain: No complaint of pain throughout session.   Agitated Behavior Scale: TBI Observation Details Observation Environment: CIR Start of observation period - Date: 02/27/21 Start of observation period - Time: 1115 End of observation period - Date: 02/27/21 End of observation period - Time: 1200 Agitated Behavior Scale (DO NOT LEAVE BLANKS) Short attention span, easy distractibility, inability to concentrate: Present to a slight degree Impulsive, impatient, low tolerance for pain or frustration: Absent Uncooperative, resistant to care, demanding: Absent Violent and/or threatening violence toward people or property: Absent Explosive and/or unpredictable anger: Absent Rocking, rubbing, moaning, or other self-stimulating behavior: Absent Pulling at tubes, restraints, etc.: Absent Wandering from treatment areas: Absent Restlessness, pacing, excessive movement: Absent Repetitive behaviors, motor, and/or verbal: Present to a slight degree Rapid, loud, or excessive talking: Absent Sudden changes of mood: Absent Easily initiated or excessive crying and/or laughter: Absent Self-abusiveness, physical  and/or verbal: Absent Agitated behavior scale total score: 16  Balance: Balance Balance Assessed:  Yes   Therapy/Group: Individual Therapy  Alger Simons PT, DPT 02/27/2021, 10:19 AM

## 2021-02-27 NOTE — Plan of Care (Signed)
  Problem: RH Furniture Transfers Goal: LTG Patient will perform furniture transfers w/assist (OT/PT) Description: LTG: Patient will perform furniture transfers  with assistance (OT/PT). Outcome: Completed/Met   Problem: RH Balance Goal: LTG: Patient will maintain dynamic sitting balance (OT) Description: LTG:  Patient will maintain dynamic sitting balance with assistance during activities of daily living (OT) Outcome: Completed/Met Goal: LTG Patient will maintain dynamic standing with ADLs (OT) Description: LTG:  Patient will maintain dynamic standing balance with assist during activities of daily living (OT)  Outcome: Completed/Met   Problem: Sit to Stand Goal: LTG:  Patient will perform sit to stand in prep for activites of daily living with assistance level (OT) Description: LTG:  Patient will perform sit to stand in prep for activites of daily living with assistance level (OT) Outcome: Completed/Met   Problem: RH Eating Goal: LTG Patient will perform eating w/assist, cues/equip (OT) Description: LTG: Patient will perform eating with assist, with/without cues using equipment (OT) Outcome: Completed/Met   Problem: RH Grooming Goal: LTG Patient will perform grooming w/assist,cues/equip (OT) Description: LTG: Patient will perform grooming with assist, with/without cues using equipment (OT) Outcome: Completed/Met   Problem: RH Bathing Goal: LTG Patient will bathe all body parts with assist levels (OT) Description: LTG: Patient will bathe all body parts with assist levels (OT) Outcome: Completed/Met   Problem: RH Dressing Goal: LTG Patient will perform upper body dressing (OT) Description: LTG Patient will perform upper body dressing with assist, with/without cues (OT). Outcome: Completed/Met Goal: LTG Patient will perform lower body dressing w/assist (OT) Description: LTG: Patient will perform lower body dressing with assist, with/without cues in positioning using equipment  (OT) Outcome: Completed/Met   Problem: RH Toileting Goal: LTG Patient will perform toileting task (3/3 steps) with assistance level (OT) Description: LTG: Patient will perform toileting task (3/3 steps) with assistance level (OT)  Outcome: Completed/Met   Problem: RH Toilet Transfers Goal: LTG Patient will perform toilet transfers w/assist (OT) Description: LTG: Patient will perform toilet transfers with assist, with/without cues using equipment (OT) Outcome: Completed/Met   Problem: RH Tub/Shower Transfers Goal: LTG Patient will perform tub/shower transfers w/assist (OT) Description: LTG: Patient will perform tub/shower transfers with assist, with/without cues using equipment (OT) Outcome: Completed/Met   Problem: RH Attention Goal: LTG Patient will demonstrate this level of attention during functional activites (OT) Description: LTG:  Patient will demonstrate this level of attention during functional activites  (OT) Outcome: Completed/Met   Problem: RH Awareness Goal: LTG: Patient will demonstrate awareness during functional activites type of (OT) Description: LTG: Patient will demonstrate awareness during functional activites type of (OT) Outcome: Completed/Met

## 2021-02-27 NOTE — Progress Notes (Signed)
Speech Language Pathology Discharge Summary  Patient Details  Name: Brandon Burke MRN: [id number] Date of Birth: 29-Jan-1932  Today's Date: 02/27/2021 SLP Individual Time: 0930-1010 SLP Individual Time Calculation (min): 40 min   Skilled Therapeutic Interventions:  Skilled treatment session focused on cognitive goals. Upon arrival, patient was extremely confused and tearful as he feared he was lost and his family was unable to find him. SLP facilitated session by providing external aids to for orientation. Patient able to independently locate and read the signs but continued to report confusion with confabulations. Patient extremely scared and kept asking SLP to "not leave his sight." SLP attempted to distract and redirect the patient with functional conversation/tasks without success as he wanted to keep his sight on the door so his family "could find him." When patient's family arrived, patient extremely happy and thanked this SLP for staying with him. Patient requested to use the bathroom and ambulated with the RW with Min A verbal cues for safety. Patient left upright in recliner with alarm on and all needs within reach.    Patient has met 4 of 4 long term goals.  Patient to discharge at overall Min;Mod level.   Reasons goals not met: N/A   Clinical Impression/Discharge Summary: Patient has made functional but inconsistent gains and has met 4 of 4 LTGs this admission. Overall, patient's cognitive functioning tends to fluctuate and patient can demonstrate behaviors consistent with a [place]o Level VI-VII. Currently, patient requires Min-Mod A multimodal cues to complete functional and familiar tasks safely in regards to problem solving, use of external aids for recall and awareness with only supervision level verbal cues needed for sustained attention. Patient and family education is complete and patient will discharge home with 24 hour supervision from family. Patient would benefit from  continued skilled SLP intervention to maximize his cognitive functioning and overall functional independence in order to reduce caregiver burden.   Care Partner:  Caregiver Able to Provide Assistance: Yes     Recommendation:  24 hour supervision/assistance;Home Health SLP  Rationale for SLP Follow Up: Reduce caregiver burden;Maximize cognitive function and independence   Equipment: N/A   Reasons for discharge: Discharged from hospital;Treatment goals met   Patient/Family Agrees with Progress Made and Goals Achieved: Yes    [name], [place] 02/27/2021, 6:10 AM

## 2021-02-27 NOTE — Progress Notes (Signed)
Occupational Therapy Discharge Summary  Patient Details  Name: Brandon Burke MRN: [id number] Date of Birth: [birth year]   Today's Date: 02/27/2021 OT Individual Time: 8295-6213 OT Individual Time Calculation (min): 55 min   Skilled Therepeutic Interventions: Pt received in bed, finishing breakfast and agreeable to OT session focusing on self-care. Pt with no c/o at beginning of session, but began complaining of L shoulder pain during functional movement with ADLs. Rest and repositioning provided as pain relief. Declines shower at this time.  ADL: Pt completes BADL at overall SUPERVISION level. Skilled interventions include:  Pt completes bathing & dressing at sink level. Requires MIN cuing at times for safety to sit to doff pants over feet & cues to turn head past midline d/t blindness in L eye. Pt with mild confusion, stating the year as 1996 and reporting that he was so glad that OT "found him" as he "lost himself" last night. OTS provided reorientation with pt stating he was grateful. OTS educates on checking to make sure wc brakes are locked before standing - pt with good recall and return demonstrating to lock brakes and verbally saying he was ready to stand to wash buttocks. Donned button-down shirt with supervision and completed further grooming seated at sink.   Pt left at end of session in bed with exit alarm on, call light in reach and all needs met.   Clinical Impression/Discharge Summary: Brandon Burke has improved during his time at CIR from TOTAL A at eval to discharging at overall close SUPERVISION level. Pt still requires min cuing for safety at times for LB ADLs. Pt is oriented to self and to hospital, but still unable to recall details of hospitalization with intermittent reports of confusion. Family education has been completed at this time and wife is aware of level of assistance required - he will discharge home with 24 hour supervision from wife and family members. He is  demonstrating behaviors consistent with a Rancho Level VI-VII.   Patient has met 14 of 14 long term goals due to improved activity tolerance, improved balance, postural control, ability to compensate for deficits, improved attention, improved awareness, and improved coordination.  Patient to discharge at overall Supervision level.  Patient's care partner is independent to provide the necessary cognitive assistance at discharge.    Reasons goals not met: N/A   Recommendation:  Patient will benefit from ongoing skilled OT services in home health setting to continue to advance functional skills in the area of BADL and Reduce care partner burden.  Equipment: No equipment provided  Reasons for discharge: treatment goals met and discharge from hospital  Patient/family agrees with progress made and goals achieved: Yes  OT Discharge Precautions/Restrictions  Precautions Precautions: Fall Restrictions Weight Bearing Restrictions: No General   Vital Signs Therapy Vitals Pulse Rate: 74 BP: (!) 128/57 Pain Pain Assessment Pain Scale: 0-10 Pain Score: 4  Pain Location: Shoulder Pain Orientation: Left Pain Descriptors / Indicators: Aching ADL ADL Eating: Set up Where Assessed-Eating: Bed level Grooming: Supervision/safety Where Assessed-Grooming: Sitting at sink Upper Body Bathing: Supervision/safety Where Assessed-Upper Body Bathing: Sitting at sink Lower Body Bathing: Minimal cueing, Supervision/safety Where Assessed-Lower Body Bathing: Standing at sink Upper Body Dressing: Supervision/safety Where Assessed-Upper Body Dressing: Sitting at sink Lower Body Dressing: Supervision/safety Where Assessed-Lower Body Dressing: Sitting at sink (attempts to dress LB in standing, min cues to redirect to sit down to thread feet into pants) Toileting: Supervision/safety Where Assessed-Toileting: Bedside Commode Toilet Transfer: Close supervision, Minimal verbal cueing Toilet Transfer  Method:  Ambulating (RW) Science writer: Grab bars, Bedside commode Tub/Shower Transfer: Close supervison, Minimal cueing Tub/Shower Transfer Method: Optometrist: Grab bars, Facilities manager: Close supervision Social research officer, government Method: Ambulating (RW) Youth worker: Grab bars, Transfer tub bench ADL Comments:  (cuing to turn head to L to locate objects for blindness in L eye.) Vision Eye Alignment: Impaired (comment) Additional Comments: Blind in L eye post fall, closes eyes frequently and mild exotropia on R eye Perception  Perception: Impaired Praxis Praxis: Impaired Cognition Overall Cognitive Status: Impaired/Different from baseline Arousal/Alertness: Awake/alert Orientation Level: Oriented to person;Oriented to place Year: Other (Comment) (1995) Month: October (with calender on wall) Day of Week: Incorrect Attention: Selective Focused Attention: Appears intact Sustained Attention: Appears intact Sustained Attention Impairment: Verbal basic;Functional basic Selective Attention: Impaired Selective Attention Impairment: Verbal basic;Functional basic Memory: Impaired Memory Impairment: Decreased recall of new information;Decreased short term memory;Retrieval deficit Decreased Short Term Memory: Verbal basic;Functional basic Immediate Memory Recall: Sock;Blue;Bed Memory Recall Sock: Not able to recall (recalled with 2 choices) Memory Recall Blue: Not able to recall (recalled with 2 choices) Memory Recall Bed: With Cue Awareness: Impaired Awareness Impairment: Emergent impairment Problem Solving: Impaired Problem Solving Impairment: Verbal basic;Functional basic Executive Function: Self Monitoring;Organizing Organizing: Impaired Organizing Impairment: Functional complex;Verbal complex Self Monitoring: Impaired Self Monitoring Impairment: Verbal complex;Functional complex Behaviors: Perseveration (mild  perseveration about L shoulder pain) Safety/Judgment: Impaired Comments:  (pt confused) [place] [employer] Scales of Cognitive Functioning: Automatic/appropriate Sensation Sensation Light Touch: Appears Intact Hot/Cold: Appears Intact Proprioception: Impaired Detail Proprioception Impaired Details: Impaired RUE Coordination Fine Motor Movements are Fluid and Coordinated: No (mild dysmetria on R) Finger Nose Finger Test: L arm in pain NT, R hand slow but WFL Motor  Motor Motor: Abnormal postural alignment and control Motor - Skilled Clinical Observations: Generalized weakness Motor - Discharge Observations: Generalized weakness, L arm limited by pain Mobility  Bed Mobility Bed Mobility: Rolling Right;Supine to Sit Rolling Right: Supervision/verbal cueing Rolling Left: Supervision/Verbal cueing Supine to Sit: Supervision/Verbal cueing Sitting - Scoot to Edge of Bed: Supervision/Verbal cueing Sit to Supine: Supervision/Verbal cueing Transfers Sit to Stand: Supervision/Verbal cueing Stand to Sit: Supervision/Verbal cueing  Trunk/Postural Assessment  Cervical Assessment Cervical Assessment: Exceptions to [facility] Thoracic Assessment Thoracic Assessment: Exceptions to [facility] Lumbar Assessment Lumbar Assessment: Exceptions to [facility] Postural Control Postural Control: Deficits on evaluation Righting Reactions: delayed x 4 Protective Responses: delayed x 4  Balance Balance Balance Assessed: Yes Extremity/Trunk Assessment RUE Assessment RUE Assessment: Within Functional Limits Active Range of Motion (AROM) Comments: WFL (shoulder 4/5, elbow flexion/ext 5/5) General Strength Comments: generalized weakness LUE Assessment LUE Assessment: Not tested (pt c/o pain in L shoulder, ROM NT) General Strength Comments: Generalized weakness    02/27/21 0900  Observation Details  Observation Environment pts room  Start of observation period - Date 02/27/21  Start of observation period - Time  0730  End of observation period - Date 02/27/21  End of observation period - Time 0830  Agitated Behavior Scale (DO NOT LEAVE BLANKS)  Short attention span, easy distractibility, inability to concentrate 2  Impulsive, impatient, low tolerance for pain or frustration 1  Uncooperative, resistant to care, demanding 1  Violent and/or threatening violence toward people or property 1  Explosive and/or unpredictable anger 1  Rocking, rubbing, moaning, or other self-stimulating behavior 1  Pulling at tubes, restraints, etc. 1  Wandering from treatment areas 1  Restlessness, pacing, excessive movement 1  Repetitive behaviors, motor, and/or verbal 2  Rapid, loud, or excessive talking 1  Sudden changes of mood 1  Easily initiated or excessive crying and/or laughter 1  Self-abusiveness, physical and/or verbal 1  Agitated behavior scale total score 16   Brandon Burke 02/27/2021, 10:03 AM

## 2021-02-27 NOTE — Progress Notes (Signed)
Physical Therapy Discharge Summary  Patient Details  Name: Brandon Burke MRN: [id number] Date of Birth: [birth year]  Today's Date: 02/27/2021 PT Individual Time: 6144-3154 PT Individual Time Calculation (min): 56 min    Patient has met 8 of 8 long term goals due to improved activity tolerance, improved balance, improved postural control, and increased strength.  Patient to discharge at an ambulatory level Supervision.   Patient's wife, brother, and sister-in-law attended family education and are able to provide the necessary physical and cognitive assistance at discharge.  Reasons goals not met: NA  Recommendation:  Patient will benefit from ongoing skilled PT services in home health setting to continue to advance safe functional mobility, address ongoing impairments in safety awareness, balance, ambulation, and minimize fall risk.  Equipment: No equipment provided  Reasons for discharge: treatment goals met and discharge from hospital  Patient/family agrees with progress made and goals achieved: Yes  Skilled Therapeutic Interventions: Pt received seated in recliner and agrees to therapy. No complaint of pain. Sit to stand and step transfer to [facility] with RW and verbal cues for sequencing and positioning. WC transport to gym for time management. Pt ambulates x250' with RW and cues to maintain upright gaze to improve posture and balance, as well as cues for navigation in crowded environment. Without requiring rest break, pt completes x12 6" steps with bilateral hand rails and cues for step sequencing and body position. Pt takes extended seated rest break prior to performing additional mobility. PT demonstrates and explains TUG test and pt performs, requiring min cues to complete correctly. Pt scores are 21.2, 19.9, and 21.1 seconds. Pt previous TUG time earlier in rehab stay was 2 min and 32 seconds, indicating significant improvement in functional strength and balance. Pt ambulates back to  room, x250', with RW and verbal cues for RW management and sequencing of transition to recliner. Left seated with alarm intact and all needs within reach.  PT Discharge Precautions/Restrictions Precautions Precautions: Fall Restrictions Weight Bearing Restrictions: No Pain Interference Pain Interference Pain Effect on Sleep: 1. Rarely or not at all Pain Interference with Therapy Activities: 1. Rarely or not at all Pain Interference with Day-to-Day Activities: 1. Rarely or not at all Vision/Perception  Perception Perception: Impaired Praxis Praxis: Impaired Praxis Impairment Details: Motor planning;Perseveration  Cognition Overall Cognitive Status: Impaired/Different from baseline Arousal/Alertness: Awake/alert Orientation Level: Oriented to person;Oriented to place Safety/Judgment: Impaired [place] [employer] Scales of Cognitive Functioning: Automatic/appropriate Sensation Sensation Light Touch: Appears Intact Proprioception: Impaired Detail Proprioception Impaired Details: Impaired RUE Coordination Gross Motor Movements are Fluid and Coordinated: Yes Fine Motor Movements are Fluid and Coordinated: No Finger Nose Finger Test: L arm in pain NT, R hand slow but WFL Motor  Motor Motor: Abnormal postural alignment and control Motor - Skilled Clinical Observations: Generalized weakness Motor - Discharge Observations: Generalized weakness  Mobility Bed Mobility Bed Mobility: Supine to Sit;Sit to Supine Supine to Sit: Supervision/Verbal cueing Sit to Supine: Supervision/Verbal cueing Transfers Transfers: Sit to Stand;Stand to Sit;Stand Pivot Transfers Sit to Stand: Supervision/Verbal cueing Stand to Sit: Supervision/Verbal cueing Stand Pivot Transfers: Supervision/Verbal cueing Stand Pivot Transfer Details: Verbal cues for safe use of DME/AE;Verbal cues for technique Transfer (Assistive device): Rolling walker Locomotion  Gait Ambulation: Yes Gait Assistance:  Supervision/Verbal cueing Gait Distance (Feet): 250 Feet Assistive device: Rolling walker Gait Assistance Details: Verbal cues for technique;Verbal cues for safe use of DME/AE Gait Gait: Yes Gait Pattern: Impaired (stooped posture, decreased step heigh) Stairs / Additional Locomotion Stairs: Yes Stairs Assistance:  Supervision/Verbal cueing Stair Management Technique: Two rails Number of Stairs: 12 Height of Stairs: 6 Ramp: Supervision/Verbal cueing Curb: Supervision/Verbal cueing Wheelchair Mobility Wheelchair Mobility: No  Trunk/Postural Assessment  Cervical Assessment Cervical Assessment:  (forward head) Thoracic Assessment Thoracic Assessment:  (rounded shoulders) Lumbar Assessment Lumbar Assessment:  (posterior pelvic tilt) Postural Control Postural Control: Deficits on evaluation Righting Reactions: delayed, but improved from eval  Balance Balance Balance Assessed: Yes Static Sitting Balance Static Sitting - Balance Support: Feet supported Static Sitting - Level of Assistance: 5: Stand by assistance Dynamic Sitting Balance Dynamic Sitting - Balance Support: Feet supported Dynamic Sitting - Level of Assistance: 5: Stand by assistance Static Standing Balance Static Standing - Balance Support: During functional activity;Bilateral upper extremity supported Static Standing - Level of Assistance: 5: Stand by assistance Dynamic Standing Balance Dynamic Standing - Balance Support: During functional activity;Bilateral upper extremity supported Dynamic Standing - Level of Assistance: 5: Stand by assistance Extremity Assessment  RLE Assessment Passive Range of Motion (PROM) Comments: wfl Active Range of Motion (AROM) Comments: wfl General Strength Comments: grossly 4+/5 LLE Assessment Passive Range of Motion (PROM) Comments: wfl Active Range of Motion (AROM) Comments: wfl General Strength Comments: Grossly 4+/5    Breck Coons, PT, DPT 02/27/2021, 4:25 PM

## 2021-02-27 NOTE — Progress Notes (Signed)
Inpatient Rehabilitation Care Coordinator Discharge Note   Patient Details  Name: Brandon Burke MRN: [id number] Date of Birth: 1932-04-28   Discharge location: D/c to home with support from Wife. BIL to stay with him for the first two weeks after discharge.  Length of Stay: 16 days  Discharge activity level: Supervision to Min A  Home/community participation: Limited.  Patient response [name] Literacy - How often do you need to have someone help you when you read instructions, pamphlets, or other written material from your doctor or pharmacy?: Often  Patient response [name] Isolation - How often do you feel lonely or isolated from those around you?: Rarely  Services provided included: MD, RD, PT, SLP, RN, OT, CM, TR, Pharmacy, Neuropsych, SW  Financial Services:  Charity fundraiser Utilized: [place] offered to/list presented to: Yes  Follow-up services arranged:  [place]: [place] for HHPT/OT/SLP    Patient response to transportation need: Is the patient able to respond to transportation needs?: Yes In the past 12 months, has lack of transportation kept you from medical appointments or from getting medications?: No In the past 12 months, has lack of transportation kept you from meetings, work, or from getting things needed for daily living?: No  Comments (or additional information):  Patient/Family verbalized understanding of follow-up arrangements:  Yes  Individual responsible for coordination of the follow-up plan: contact pt wife Brandon Burke [phone]  Confirmed correct DME delivered: Brandon Burke 02/27/2021    Brandon Burke

## 2021-02-27 NOTE — Plan of Care (Signed)
  Problem: Consults Goal: RH BRAIN INJURY PATIENT EDUCATION Description: Description: See Patient Education module for eduction specifics Outcome: Progressing   Problem: RH BOWEL ELIMINATION Goal: RH STG MANAGE BOWEL WITH ASSISTANCE Description: STG Manage Bowel with Supervision Assistance. Outcome: Progressing Goal: RH STG MANAGE BOWEL W/MEDICATION W/ASSISTANCE Description: STG Manage Bowel with Medication with Supervision Assistance. Outcome: Progressing   Problem: RH SKIN INTEGRITY Goal: RH STG MAINTAIN SKIN INTEGRITY WITH ASSISTANCE Description: STG Maintain Skin Integrity With Supervision Assistance. Outcome: Progressing Goal: RH STG ABLE TO PERFORM INCISION/WOUND CARE W/ASSISTANCE Description: STG Able To Perform Incision/Wound Care With Supervision Assistance. Outcome: Progressing   Problem: RH SAFETY Goal: RH STG ADHERE TO SAFETY PRECAUTIONS W/ASSISTANCE/DEVICE Description: STG Adhere to Safety Precautions With Supervision Assistance/Device. Outcome: Progressing Goal: RH STG DECREASED RISK OF FALL WITH ASSISTANCE Description: STG Decreased Risk of Fall With Supervision Assistance. Outcome: Progressing   Problem: RH COGNITION-NURSING Goal: RH STG USES MEMORY AIDS/STRATEGIES W/ASSIST TO PROBLEM SOLVE Description: STG Uses Memory Aids/Strategies With Supervision Assistance to Problem Solve. Outcome: Progressing Goal: RH STG ANTICIPATES NEEDS/CALLS FOR ASSIST W/ASSIST/CUES Description: STG Anticipates Needs/Calls for Assist With Supervision Assistance/Cues. Outcome: Progressing   Problem: RH PAIN MANAGEMENT Goal: RH STG PAIN MANAGED AT OR BELOW PT'S PAIN GOAL Description: < 2 on a 0-10 pain scale. Outcome: Progressing   Problem: RH KNOWLEDGE DEFICIT BRAIN INJURY Goal: RH STG INCREASE KNOWLEDGE OF SELF CARE AFTER BRAIN INJURY Description: Patient will demonstrate knowledge of medication management, pain management, bowel management with educational materials and  handouts provided by staff independently at discharge. Outcome: Progressing

## 2021-02-27 NOTE — Progress Notes (Signed)
PROGRESS NOTE   Subjective/Complaints: Patient confused this morning. He is not sure where he is, very pleasant and appreciative when I say I am the doctor. Has note from his wife but does not understand it  ROS: can not obtain given mental status.   Objective:   No results found. No results for input(s): WBC, HGB, HCT, PLT in the last 72 hours.   No results for input(s): NA, K, CL, CO2, GLUCOSE, BUN, CREATININE, CALCIUM in the last 72 hours.    Intake/Output Summary (Last 24 hours) at 02/27/2021 1141 Last data filed at 02/27/2021 0803 Gross per 24 hour  Intake 340 ml  Output 200 ml  Net 140 ml        Physical Exam: Vital Signs Blood pressure (!) 128/57, pulse 74, temperature 98 F (36.7 C), temperature source Oral, resp. rate 16, height 6' 0.01" (1.829 m), weight 78.5 kg, SpO2 94 %. Gen: no distress, normal appearing HEENT: oral mucosa pink and moist, NCAT Cardio: Reg rate Chest: normal effort, normal rate of breathing Abd: soft, non-distended Ext: no edema Psych: pleasant, normal affect Skin: Warm and dry.  Intact. Psych: Normal mood.  Pleasantly confused. Musc: No edema in extremities.  No tenderness in extremities. Neuro: Alert and oriented x1 Motor: Grossly 4+/5 throughout, stable  Assessment/Plan: 1. Functional deficits which require 3+ hours per day of interdisciplinary therapy in a comprehensive inpatient rehab setting. Physiatrist is providing close team supervision and 24 hour management of active medical problems listed below. Physiatrist and rehab team continue to assess barriers to discharge/monitor patient progress toward functional and medical goals  Care Tool:  Bathing    Body parts bathed by patient: Right arm, Left arm, Chest, Abdomen, Front perineal area, Buttocks, Right upper leg, Left upper leg, Right lower leg, Left lower leg, Face   Body parts bathed by helper: Right lower leg, Left  lower leg     Bathing assist Assist Level: Supervision/Verbal cueing     Upper Body Dressing/Undressing Upper body dressing   What is the patient wearing?: Button up shirt    Upper body assist Assist Level: Supervision/Verbal cueing    Lower Body Dressing/Undressing Lower body dressing      What is the patient wearing?: Pants, Underwear/pull up     Lower body assist Assist for lower body dressing: Supervision/Verbal cueing     Toileting Toileting Toileting Activity did not occur (Clothing management and hygiene only): N/A (no void or bm)  Toileting assist Assist for toileting: Supervision/Verbal cueing     Transfers Chair/bed transfer  Transfers assist  Chair/bed transfer activity did not occur: Safety/medical concerns (Pt unable to maintian alertness)  Chair/bed transfer assist level: Supervision/Verbal cueing     Locomotion Ambulation   Ambulation assist      Assist level: Minimal Assistance - Patient > 75% Assistive device: No Device Max distance: 126ft   Walk 10 feet activity   Assist     Assist level: Contact Guard/Touching assist Assistive device: No Device   Walk 50 feet activity   Assist    Assist level: Contact Guard/Touching assist Assistive device: No Device    Walk 150 feet activity   Assist Walk 150  feet activity did not occur: Safety/medical concerns  Assist level: Contact Guard/Touching assist Assistive device: No Device    Walk 10 feet on uneven surface  activity   Assist     Assist level: Contact Guard/Touching assist Assistive device: Other (comment)   Wheelchair     Assist Is the patient using a wheelchair?: Yes Type of Wheelchair: Manual    Wheelchair assist level: Total Assistance - Patient < 25% Max wheelchair distance: 150    Wheelchair 50 feet with 2 turns activity    Assist        Assist Level: Total Assistance - Patient < 25%   Wheelchair 150 feet activity     Assist       Assist Level: Total Assistance - Patient < 25%   Blood pressure (!) 128/57, pulse 74, temperature 98 F (36.7 C), temperature source Oral, resp. rate 16, height 6' 0.01" (1.829 m), weight 78.5 kg, SpO2 94 %.  Medical Problem List and Plan: 1.  TBI/concussion secondary to fall 02/03/2021.  Cranial CT scan negative.  Continue CIR 2.  Antithrombotics: -DVT/anticoagulation:   Eliquis             -antiplatelet therapy: N/A 3. Pain: continue Lidoderm patch, Robaxin as needed, Ultram as needed             Monitor with increased exertion 4. Insomnia: continue seroquel to 50mg . Discussed with wife that this can also help with agitation/sundowning and she is agreeable- patient has been calling her at night               -antipsychotic agents: seroquel at HS with PRN dose  -maintain sleep chart  -reduce stimuli at night (TV)/behavioral plan  -continue ritalin to help with arousal, attention and focus during the day  -Staph Epi UTI: treated  Telemetry sitter for safety 5. Neuropsych: This patient is not capable of making decisions on his own behalf. 6. Skin/Wound Care: Left facial laceration or abrasion.  Sutures removed.  Routine skin checks 7. Fluids/Electrolytes/Nutrition:              -encourage PO's  - protein supp for low albumin  - no offending meds    BMP Latest Ref Rng & Units 02/23/2021 02/20/2021 02/19/2021  Glucose 70 - 99 mg/dL 96 94 102(H)  BUN 8 - 23 mg/dL 23 20 29(H)  Creatinine 0.61 - 1.24 mg/dL 1.10 1.07 1.25(H)  Sodium 135 - 145 mmol/L 139 139 140  Potassium 3.5 - 5.1 mmol/L 3.8 3.6 3.9  Chloride 98 - 111 mmol/L 108 111 108  CO2 22 - 32 mmol/L 26 24 25   Calcium 8.9 - 10.3 mg/dL 8.9 8.6(L) 9.1    8.  Multiple left rib fractures with extrapleural hematoma.  Conservative management 9.  Density right middle lung lobe.  Incidental on CT.  Follow-up outpatient 10.  History of DVT/pulmonary emboli.  Eliquis 11.  Essential hypertension: Tenormin 50 mg daily   Vitals:    02/26/21 2006 02/27/21 0835  BP: (!) 142/53 (!) 128/57  Pulse: 77 74  Resp: 16   Temp: 98 F (36.7 C)   SpO2: 94%    SBP elevated and DBP soft, continue current regimen and monitoring TID 12.  BPH.  Flomax 0.4 mg daily.    Improved 13.  Hyperlipidemia.  Pravachol 14.  Hypothyroidism: Synthroid 15.  Drug-induced constipation.  MiraLAX daily, Colace 100 mg twice daily             Improving 16.  Transaminitis:  Resolved 17. Acute blood loss anemia  Hb 12.7 on 10/24  Cont to monitor  LOS: 16 days A FACE TO FACE EVALUATION WAS PERFORMED  Martha Clan P Madelein Mahadeo 02/27/2021, 11:41 AM

## 2021-02-27 NOTE — Progress Notes (Signed)
Inpatient Rehabilitation Discharge Medication Review by a Pharmacist  A complete drug regimen review was completed for this patient to identify any potential clinically significant medication issues.  High Risk Drug Classes Is patient taking? Indication by Medication  Antipsychotic Yes Seroquel (sundowning, sleep) ritalin (arousal during the day)  Anticoagulant Yes Apixaban (DVT)  Antibiotic No   Opioid Yes Tramadol (pain-left rib fracture, multiple)  Antiplatelet No   Hypoglycemics/insulin No   Vasoactive Medication Yes Atenolol (HTN) Flomax (BPH)  Chemotherapy No   Other No      Type of Medication Issue Identified Description of Issue Recommendation(s)  Drug Interaction(s) (clinically significant)     Duplicate Therapy     Allergy     No Medication Administration End Date     Incorrect Dose     Additional Drug Therapy Needed     Significant med changes from prior encounter (inform family/care partners about these prior to discharge). Start: lidoderm patches, robaxin, ritalin, seroquel, flomax, tramadol  Continue: Tylenol, Apixaban, atenolol, Tums, Colace,Flonase, Synthroid, Miralax, pravachol, Vitamin D, artificial tears, Cosopt eye drops, claritin, multivitamins, Travatan 0.004% eye drops  Stop: Probiotic (Align), Azelastine, Mucinex-DM, Lidex, Moringa, folic acid Education prior to discharge  AVS printed  Other       Clinically significant medication issues were identified that warrant physician communication and completion of prescribed/recommended actions by midnight of the next day:  No  Name of provider notified for urgent issues identified:   Provider Method of Notification:     Pharmacist comments:   Time spent performing this drug regimen review (minutes):  30   Tahirih Lair BS, PharmD, BCPS Clinical Pharmacist 02/27/2021 1:10 PM

## 2021-03-01 NOTE — Progress Notes (Signed)
Patient discharged to home with wife. Patient transported via wheelchair to family car. Sanda Linger, LPN

## 2021-03-02 ENCOUNTER — Ambulatory Visit: Payer: PPO | Admitting: Internal Medicine

## 2021-03-02 DIAGNOSIS — M6281 Muscle weakness (generalized): Secondary | ICD-10-CM | POA: Diagnosis not present

## 2021-03-02 DIAGNOSIS — E785 Hyperlipidemia, unspecified: Secondary | ICD-10-CM | POA: Diagnosis not present

## 2021-03-02 DIAGNOSIS — I341 Nonrheumatic mitral (valve) prolapse: Secondary | ICD-10-CM | POA: Diagnosis not present

## 2021-03-02 DIAGNOSIS — R2689 Other abnormalities of gait and mobility: Secondary | ICD-10-CM | POA: Diagnosis not present

## 2021-03-02 DIAGNOSIS — M1990 Unspecified osteoarthritis, unspecified site: Secondary | ICD-10-CM | POA: Diagnosis not present

## 2021-03-02 DIAGNOSIS — Z86718 Personal history of other venous thrombosis and embolism: Secondary | ICD-10-CM | POA: Diagnosis not present

## 2021-03-02 DIAGNOSIS — R2681 Unsteadiness on feet: Secondary | ICD-10-CM | POA: Diagnosis not present

## 2021-03-02 DIAGNOSIS — Z8546 Personal history of malignant neoplasm of prostate: Secondary | ICD-10-CM | POA: Diagnosis not present

## 2021-03-02 DIAGNOSIS — I2699 Other pulmonary embolism without acute cor pulmonale: Secondary | ICD-10-CM | POA: Diagnosis not present

## 2021-03-03 ENCOUNTER — Telehealth: Payer: Self-pay | Admitting: *Deleted

## 2021-03-03 ENCOUNTER — Ambulatory Visit: Payer: PPO | Admitting: Internal Medicine

## 2021-03-03 DIAGNOSIS — I2699 Other pulmonary embolism without acute cor pulmonale: Secondary | ICD-10-CM | POA: Diagnosis not present

## 2021-03-03 DIAGNOSIS — M6281 Muscle weakness (generalized): Secondary | ICD-10-CM | POA: Diagnosis not present

## 2021-03-03 DIAGNOSIS — R2689 Other abnormalities of gait and mobility: Secondary | ICD-10-CM | POA: Diagnosis not present

## 2021-03-03 DIAGNOSIS — Z8546 Personal history of malignant neoplasm of prostate: Secondary | ICD-10-CM | POA: Diagnosis not present

## 2021-03-03 DIAGNOSIS — R2681 Unsteadiness on feet: Secondary | ICD-10-CM | POA: Diagnosis not present

## 2021-03-03 DIAGNOSIS — M1990 Unspecified osteoarthritis, unspecified site: Secondary | ICD-10-CM | POA: Diagnosis not present

## 2021-03-03 DIAGNOSIS — Z86718 Personal history of other venous thrombosis and embolism: Secondary | ICD-10-CM | POA: Diagnosis not present

## 2021-03-03 DIAGNOSIS — E785 Hyperlipidemia, unspecified: Secondary | ICD-10-CM | POA: Diagnosis not present

## 2021-03-03 NOTE — Telephone Encounter (Signed)
Transition Care Management Unsuccessful Follow-up Telephone Call  Date of discharge and from where:  02/26/21 [facility] Inpatient Rehabilitation Unit  Attempts:  1st Attempt  Reason for unsuccessful TCM follow-up call:  Unable to reach patient, person identified as [name] answered patient's contact number and states patient is doing much better since returning home and is currently receiving home physical therapy. Melodie Bouillon another attempt to reach patient to complete transition of care assessment at another time.  Kelli Churn RN, CCM, [facility] Care Management Coordinator - Managed [place] High Risk [phone]

## 2021-03-03 NOTE — Telephone Encounter (Signed)
Encounter opened in wrong context,. See other note of today.   Kelli Churn RN, CCM, [facility] Care Management Coordinator - Managed [place] High Risk [phone]

## 2021-03-04 ENCOUNTER — Encounter: Payer: PPO | Attending: Physical Medicine & Rehabilitation | Admitting: Physical Medicine & Rehabilitation

## 2021-03-04 ENCOUNTER — Other Ambulatory Visit: Payer: Self-pay

## 2021-03-04 ENCOUNTER — Encounter: Payer: Self-pay | Admitting: Physical Medicine & Rehabilitation

## 2021-03-04 VITALS — BP 125/66 | HR 80 | Temp 97.9°F | Ht 72.0 in | Wt 173.2 lb

## 2021-03-04 DIAGNOSIS — I2699 Other pulmonary embolism without acute cor pulmonale: Secondary | ICD-10-CM | POA: Diagnosis not present

## 2021-03-04 DIAGNOSIS — M6281 Muscle weakness (generalized): Secondary | ICD-10-CM | POA: Diagnosis not present

## 2021-03-04 DIAGNOSIS — E785 Hyperlipidemia, unspecified: Secondary | ICD-10-CM | POA: Diagnosis not present

## 2021-03-04 DIAGNOSIS — S069X1D Unspecified intracranial injury with loss of consciousness of 30 minutes or less, subsequent encounter: Secondary | ICD-10-CM | POA: Diagnosis not present

## 2021-03-04 DIAGNOSIS — R2689 Other abnormalities of gait and mobility: Secondary | ICD-10-CM | POA: Diagnosis not present

## 2021-03-04 DIAGNOSIS — S2249XS Multiple fractures of ribs, unspecified side, sequela: Secondary | ICD-10-CM | POA: Diagnosis not present

## 2021-03-04 DIAGNOSIS — M1990 Unspecified osteoarthritis, unspecified site: Secondary | ICD-10-CM | POA: Diagnosis not present

## 2021-03-04 DIAGNOSIS — Z8546 Personal history of malignant neoplasm of prostate: Secondary | ICD-10-CM | POA: Diagnosis not present

## 2021-03-04 DIAGNOSIS — R2681 Unsteadiness on feet: Secondary | ICD-10-CM | POA: Diagnosis not present

## 2021-03-04 DIAGNOSIS — Z86718 Personal history of other venous thrombosis and embolism: Secondary | ICD-10-CM | POA: Diagnosis not present

## 2021-03-04 NOTE — Progress Notes (Signed)
Subjective:    Patient ID: Brandon Burke, male    DOB: 07-31-1931, 87 y.o.   MRN: [id number]  HPI This is a transitional care visit for Brandon Burke who was discharged from rehab on October 28.  He suffered his injury due to a fall in October 4.  Course was complicated by confusion and alteration of his day night cycle.  He showed some improvement prior to coming home and family noticed more improvement after being home.  Family felt that he did better after stopping the Ritalin which was not filled due to unavailability at discharge at the patient's pharmacy.  For the most part now he is sleeping at night. He may only sleep 5 or 6 hours where he slept 8-10 previously.  His wife asked if he could take some Tylenol PM to help rest at night.  He is on Seroquel still 50 mg to help him rest.  He is taking a nap during the day time some days up to 2 hours or more.  Patient reports ongoing pain along the left chest wall and back.  They had a prescription for lidocaine patches but these have not been filled so far.  Appetite seems to be improving.  Mood has been generally upbeat.  He denies any bowel or bladder problems at present.He [place] swallowing without any issues.  Family remains very supportive of him.   Pain Inventory Average Pain 5 Pain Right Now 3 My pain is aching  LOCATION OF PAIN  shoulder left side  BOWEL Number of stools per week: 7 Oral laxative use Yes  Type of laxative stool softner and miralax Enema or suppository use No  History of colostomy No  Incontinent No   BLADDER Normal   Mobility walk with assistance use a walker how many minutes can you walk? 10 ability to climb steps?  yes do you drive?  no  Function retired  Neuro/Psych trouble walking confusion  Prior Studies Any changes since last visit?  no  Physicians involved in your care Any changes since last visit?  no  Has seen no other MD's since discharge   Family History  Problem  Relation Age of Onset   CAD Father 29   Hypertension Father    Glaucoma Mother    Diabetes Neg Hx    Colon cancer Neg Hx    Prostate cancer Neg Hx    Social History   Socioeconomic History   Marital status: Married    Spouse name: Not on file   Number of children: 0   Years of education: Not on file   Highest education level: Not on file  Occupational History   Occupation: Retired    Fish farm manager: RETIRED  Tobacco Use   Smoking status: Never   Smokeless tobacco: Never  Vaping Use   Vaping Use: Never used  Substance and Sexual Activity   Alcohol use: No   Drug use: No   Sexual activity: Not on file  Other Topics Concern   Not on file  Social History Narrative   ** Merged History Encounter **       Married, wife w/ melanoma, advanced, doing well as off 10/2019 no children  Moved to [facility] 03-2016 (  at the independent area as off 10/2018)   Social Determinants of Health   Financial Resource Strain: Not on file  Food Insecurity: Not on file  Transportation Needs: Not on file  Physical Activity: Not on file  Stress: Not  on file  Social Connections: Not on file   Past Surgical History:  Procedure Laterality Date   CATARACT EXTRACTION Bilateral    COLONOSCOPY     several   EYE SURGERY Bilateral    Cat Sx   PARS PLANA VITRECTOMY Left 09/06/2019   Procedure: PARS PLANA VITRECTOMY WITH 25 GAUGE WITH INTRAVITREAL ANTIBIOTICS;  Surgeon: [name], MD;  Location: [facility];  Service: Ophthalmology;  Laterality: Left;   PHOTOCOAGULATION WITH LASER Left 09/06/2019   Procedure: Photocoagulation With Laser;  Surgeon: [name], MD;  Location: [place] [facility];  Service: Ophthalmology;  Laterality: Left;   PROSTATECTOMY  2000   RUPTURED GLOBE EXPLORATION AND REPAIR Left 08/16/2019   Procedure: OPEN GLOBE EXPLORATION EYE POSSIBLE ANTERIOR  CHAMBER [place] OUT;  Surgeon: Lonia Skinner, MD;  Location: [place];  Service: Ophthalmology;  Laterality: Left;   Past Medical History:   Diagnosis Date   Constipation    DVT (deep venous thrombosis) (HCC)    hx of 2001 (after prostate surgery)     Glaucoma    Hyperlipidemia    Hypertension    Melanoma ([place])    face, hand   Mitral valve prolapse    OSA (obstructive sleep apnea) 11/26/2020   PE (pulmonary embolism)    5-09:was referred to hematology, and they recommend to discontinue Coumadin 5/11   Prostate cancer ([place])     released from urology in 2010,needs yearly PSAs with PCP   Seasonal allergies    BP 125/66   Pulse 80   Temp 97.9 F (36.6 C)   Ht 6' (1.829 m)   Wt 173 lb 3.2 oz (78.6 kg)   SpO2 98%   BMI 23.49 kg/m   Opioid Risk Score:   Fall Risk Score:  `1  Depression screen PHQ 2/9  Depression screen [facility] 2/9 03/04/2021 11/25/2020 06/06/2020 04/29/2020 08/15/2019 07/26/2017 09/22/2016  Decreased Interest 3 0 2 0 0 0 0  Down, Depressed, Hopeless 0 0 1 0 0 0 0  PHQ - 2 Score 3 0 3 0 0 0 0  Altered sleeping 0 0 2 - - - -  Tired, decreased energy 1 0 3 - - - -  Change in appetite 0 0 0 - - - -  Feeling bad or failure about yourself  0 0 0 - - - -  Trouble concentrating 3 0 0 - - - -  Moving slowly or fidgety/restless 0 0 0 - - - -  Suicidal thoughts 0 0 0 - - - -  PHQ-9 Score 7 0 8 - - - -  Difficult doing work/chores - Not difficult at all - - - - -     Review of Systems  Constitutional:  Positive for unexpected weight change.       Wt loss  HENT: Negative.    Eyes: Negative.   Respiratory: Negative.    Cardiovascular: Negative.   Gastrointestinal: Negative.   Endocrine: Negative.   Genitourinary: Negative.   Musculoskeletal:  Positive for gait problem.  Skin: Negative.   Allergic/Immunologic: Negative.   Neurological:  Positive for weakness.  Hematological:  Bruises/bleeds easily.       Apixaban  Psychiatric/Behavioral:  Positive for confusion.   All other systems reviewed and are negative.     Objective:   Physical Exam Gen: no distress, normal appearing HEENT: oral mucosa pink and  moist, NCAT Cardio: Reg rate Chest: normal effort, normal rate of breathing Abd: soft, non-distended Ext: no edema Psych: pleasant, normal affect  Skin: intact Neuro: Alert and oriented x 3. Fair insight and awareness. Could not tell me which holiday just took place. Blind left eye,.  Normal language and speech. Cranial nerve exam unremarkable, strength 4/5. Fair standing balance, posterior lean when changing directions Musculoskeletal: Full ROM, No pain with AROM or PROM in the neck, trunk, or extremities. Posture appropriate        Assessment & Plan:   1.  TBI/concussion secondary to fall 02/03/2021.  Cranial CT scan negative.             -HHPT, OT, SLP   -Is doing fairly well with his rolling walker.  He needs to be careful with impulsive movements and sudden changes in directions.  Family seems to be good about providing appropriate supervision and safety for him.  -Cognition appears to be getting closer to baseline with apparent mild baseline dementia. 2 . Pain Management:             -heat, muscle rubs  -lidocaine patches can be purchased over-the-counter.  4. Mood/sleep/mental status: Seems to be improving and closer to baseline.             -melatonin -minimize day time naps 6. Skin/Wound Care: Wounds have healed  8.  Multiple left rib fractures with extrapleural hematoma.  Pain management 9.  Density right middle lung lobe.  Incidental on CT.  Follow-up at some point per primary 10.  History of DVT/pulmonary emboli.  Eliquis 11.  Essential hypertension: Tenormin 50 mg daily              bp controlled 12.  BPH.  Flomax 0.4 mg daily.  Output is regular with normal flow.   -Continue per discretion of primary    Thirty minutes of face to face patient care time were spent during this visit. All questions were encouraged and answered. Follow up with me as needed.

## 2021-03-04 NOTE — Patient Instructions (Addendum)
TRY TO AVOID DAY TIME NAPS. PERHAPS AN HOUR LONG NAP AT MOST (ALARM?) MELATONIN FOR SLEEP 3-6MG  3.   OCCASIONAL TYLENOL PM IF NEEDED FOR SLEEP 4.  NO NEED TO RESUME RITALIN 5. LIDOCAINE PATCHES OVER THE COUNTER. ICY HOT, SALON PAS, ETC 4%

## 2021-03-05 ENCOUNTER — Telehealth: Payer: Self-pay

## 2021-03-05 DIAGNOSIS — M1990 Unspecified osteoarthritis, unspecified site: Secondary | ICD-10-CM | POA: Diagnosis not present

## 2021-03-05 DIAGNOSIS — M6281 Muscle weakness (generalized): Secondary | ICD-10-CM | POA: Diagnosis not present

## 2021-03-05 DIAGNOSIS — Z8546 Personal history of malignant neoplasm of prostate: Secondary | ICD-10-CM | POA: Diagnosis not present

## 2021-03-05 DIAGNOSIS — Z86718 Personal history of other venous thrombosis and embolism: Secondary | ICD-10-CM | POA: Diagnosis not present

## 2021-03-05 DIAGNOSIS — E785 Hyperlipidemia, unspecified: Secondary | ICD-10-CM | POA: Diagnosis not present

## 2021-03-05 DIAGNOSIS — R2689 Other abnormalities of gait and mobility: Secondary | ICD-10-CM | POA: Diagnosis not present

## 2021-03-05 DIAGNOSIS — I2699 Other pulmonary embolism without acute cor pulmonale: Secondary | ICD-10-CM | POA: Diagnosis not present

## 2021-03-05 DIAGNOSIS — R2681 Unsteadiness on feet: Secondary | ICD-10-CM | POA: Diagnosis not present

## 2021-03-05 NOTE — Telephone Encounter (Signed)
Transition Care Management Unsuccessful Follow-up Telephone Call  Date of discharge and from where:  02/28/2021  [facility]  Attempts:  2nd Attempt  Reason for unsuccessful TCM follow-up call:  No answer/busy   Tomasa Rand, RN, BSN, CEN [place] Coordinator [phone]

## 2021-03-06 ENCOUNTER — Encounter (HOSPITAL_COMMUNITY): Payer: Self-pay | Admitting: Radiology

## 2021-03-09 DIAGNOSIS — Z8546 Personal history of malignant neoplasm of prostate: Secondary | ICD-10-CM | POA: Diagnosis not present

## 2021-03-09 DIAGNOSIS — R2681 Unsteadiness on feet: Secondary | ICD-10-CM | POA: Diagnosis not present

## 2021-03-09 DIAGNOSIS — Z86718 Personal history of other venous thrombosis and embolism: Secondary | ICD-10-CM | POA: Diagnosis not present

## 2021-03-09 DIAGNOSIS — M1990 Unspecified osteoarthritis, unspecified site: Secondary | ICD-10-CM | POA: Diagnosis not present

## 2021-03-09 DIAGNOSIS — E785 Hyperlipidemia, unspecified: Secondary | ICD-10-CM | POA: Diagnosis not present

## 2021-03-09 DIAGNOSIS — I2699 Other pulmonary embolism without acute cor pulmonale: Secondary | ICD-10-CM | POA: Diagnosis not present

## 2021-03-09 DIAGNOSIS — M6281 Muscle weakness (generalized): Secondary | ICD-10-CM | POA: Diagnosis not present

## 2021-03-09 DIAGNOSIS — R2689 Other abnormalities of gait and mobility: Secondary | ICD-10-CM | POA: Diagnosis not present

## 2021-03-10 DIAGNOSIS — E785 Hyperlipidemia, unspecified: Secondary | ICD-10-CM | POA: Diagnosis not present

## 2021-03-10 DIAGNOSIS — R2689 Other abnormalities of gait and mobility: Secondary | ICD-10-CM | POA: Diagnosis not present

## 2021-03-10 DIAGNOSIS — M6281 Muscle weakness (generalized): Secondary | ICD-10-CM | POA: Diagnosis not present

## 2021-03-10 DIAGNOSIS — Z86718 Personal history of other venous thrombosis and embolism: Secondary | ICD-10-CM | POA: Diagnosis not present

## 2021-03-10 DIAGNOSIS — R2681 Unsteadiness on feet: Secondary | ICD-10-CM | POA: Diagnosis not present

## 2021-03-10 DIAGNOSIS — Z8546 Personal history of malignant neoplasm of prostate: Secondary | ICD-10-CM | POA: Diagnosis not present

## 2021-03-10 DIAGNOSIS — M1990 Unspecified osteoarthritis, unspecified site: Secondary | ICD-10-CM | POA: Diagnosis not present

## 2021-03-10 DIAGNOSIS — I2699 Other pulmonary embolism without acute cor pulmonale: Secondary | ICD-10-CM | POA: Diagnosis not present

## 2021-03-11 DIAGNOSIS — R2689 Other abnormalities of gait and mobility: Secondary | ICD-10-CM | POA: Diagnosis not present

## 2021-03-11 DIAGNOSIS — M6281 Muscle weakness (generalized): Secondary | ICD-10-CM | POA: Diagnosis not present

## 2021-03-11 DIAGNOSIS — H02423 Myogenic ptosis of bilateral eyelids: Secondary | ICD-10-CM | POA: Diagnosis not present

## 2021-03-11 DIAGNOSIS — Z86718 Personal history of other venous thrombosis and embolism: Secondary | ICD-10-CM | POA: Diagnosis not present

## 2021-03-11 DIAGNOSIS — H4032X3 Glaucoma secondary to eye trauma, left eye, severe stage: Secondary | ICD-10-CM | POA: Diagnosis not present

## 2021-03-11 DIAGNOSIS — M1990 Unspecified osteoarthritis, unspecified site: Secondary | ICD-10-CM | POA: Diagnosis not present

## 2021-03-11 DIAGNOSIS — I2699 Other pulmonary embolism without acute cor pulmonale: Secondary | ICD-10-CM | POA: Diagnosis not present

## 2021-03-11 DIAGNOSIS — Z8546 Personal history of malignant neoplasm of prostate: Secondary | ICD-10-CM | POA: Diagnosis not present

## 2021-03-11 DIAGNOSIS — E785 Hyperlipidemia, unspecified: Secondary | ICD-10-CM | POA: Diagnosis not present

## 2021-03-11 DIAGNOSIS — H16212 Exposure keratoconjunctivitis, left eye: Secondary | ICD-10-CM | POA: Diagnosis not present

## 2021-03-11 DIAGNOSIS — H401111 Primary open-angle glaucoma, right eye, mild stage: Secondary | ICD-10-CM | POA: Diagnosis not present

## 2021-03-11 DIAGNOSIS — R2681 Unsteadiness on feet: Secondary | ICD-10-CM | POA: Diagnosis not present

## 2021-03-12 DIAGNOSIS — I2699 Other pulmonary embolism without acute cor pulmonale: Secondary | ICD-10-CM | POA: Diagnosis not present

## 2021-03-12 DIAGNOSIS — R2689 Other abnormalities of gait and mobility: Secondary | ICD-10-CM | POA: Diagnosis not present

## 2021-03-12 DIAGNOSIS — M1990 Unspecified osteoarthritis, unspecified site: Secondary | ICD-10-CM | POA: Diagnosis not present

## 2021-03-12 DIAGNOSIS — Z8546 Personal history of malignant neoplasm of prostate: Secondary | ICD-10-CM | POA: Diagnosis not present

## 2021-03-12 DIAGNOSIS — E785 Hyperlipidemia, unspecified: Secondary | ICD-10-CM | POA: Diagnosis not present

## 2021-03-12 DIAGNOSIS — M6281 Muscle weakness (generalized): Secondary | ICD-10-CM | POA: Diagnosis not present

## 2021-03-12 DIAGNOSIS — Z86718 Personal history of other venous thrombosis and embolism: Secondary | ICD-10-CM | POA: Diagnosis not present

## 2021-03-12 DIAGNOSIS — R2681 Unsteadiness on feet: Secondary | ICD-10-CM | POA: Diagnosis not present

## 2021-03-16 ENCOUNTER — Encounter: Payer: Self-pay | Admitting: Internal Medicine

## 2021-03-16 ENCOUNTER — Other Ambulatory Visit: Payer: Self-pay

## 2021-03-16 ENCOUNTER — Ambulatory Visit (INDEPENDENT_AMBULATORY_CARE_PROVIDER_SITE_OTHER): Payer: PPO | Admitting: Internal Medicine

## 2021-03-16 VITALS — BP 126/62 | HR 67 | Temp 98.0°F | Resp 18 | Ht 72.0 in | Wt 169.5 lb

## 2021-03-16 DIAGNOSIS — E039 Hypothyroidism, unspecified: Secondary | ICD-10-CM | POA: Diagnosis not present

## 2021-03-16 DIAGNOSIS — R9389 Abnormal findings on diagnostic imaging of other specified body structures: Secondary | ICD-10-CM

## 2021-03-16 DIAGNOSIS — M1990 Unspecified osteoarthritis, unspecified site: Secondary | ICD-10-CM | POA: Diagnosis not present

## 2021-03-16 DIAGNOSIS — S069X1D Unspecified intracranial injury with loss of consciousness of 30 minutes or less, subsequent encounter: Secondary | ICD-10-CM | POA: Diagnosis not present

## 2021-03-16 DIAGNOSIS — E785 Hyperlipidemia, unspecified: Secondary | ICD-10-CM

## 2021-03-16 DIAGNOSIS — M6281 Muscle weakness (generalized): Secondary | ICD-10-CM | POA: Diagnosis not present

## 2021-03-16 DIAGNOSIS — I10 Essential (primary) hypertension: Secondary | ICD-10-CM | POA: Diagnosis not present

## 2021-03-16 DIAGNOSIS — Z86718 Personal history of other venous thrombosis and embolism: Secondary | ICD-10-CM | POA: Diagnosis not present

## 2021-03-16 DIAGNOSIS — R2689 Other abnormalities of gait and mobility: Secondary | ICD-10-CM | POA: Diagnosis not present

## 2021-03-16 DIAGNOSIS — R2681 Unsteadiness on feet: Secondary | ICD-10-CM | POA: Diagnosis not present

## 2021-03-16 DIAGNOSIS — Z8546 Personal history of malignant neoplasm of prostate: Secondary | ICD-10-CM | POA: Diagnosis not present

## 2021-03-16 DIAGNOSIS — I2699 Other pulmonary embolism without acute cor pulmonale: Secondary | ICD-10-CM | POA: Diagnosis not present

## 2021-03-16 LAB — LDL CHOLESTEROL, DIRECT: Direct LDL: 71 mg/dL

## 2021-03-16 LAB — LIPID PANEL
Cholesterol: 139 mg/dL (ref 0–200)
HDL: 36 mg/dL — ABNORMAL LOW (ref >39.00)
NonHDL: 103.17
Total CHOL/HDL Ratio: 4
Triglycerides: 264 mg/dL — ABNORMAL HIGH (ref 0.0–149.0)
VLDL: 52.8 mg/dL — ABNORMAL HIGH (ref 0.0–40.0)

## 2021-03-16 LAB — TSH: TSH: 1.67 u[IU]/mL (ref 0.35–5.50)

## 2021-03-16 MED ORDER — QUETIAPINE FUMARATE 25 MG PO TABS: 25.0000 mg | ORAL_TABLET | Freq: Every day | ORAL | 1 refills | Status: DC

## 2021-03-16 NOTE — Assessment & Plan Note (Signed)
Assessment Hyperlipidemia Prostate cancer, released from urology 2010, Rx yearly PSAs by PCP HEM: DVT 2001 after surgery; PE 2009, eval by hematology, they recommend DC Coumadin 2011.  Spontaneous right leg DVT 11/2019, anticoag x life (dose decreased to Eliquis 2.5 twice daily on 04/2020) H/o MVP-- on BB glaucoma Sees dermatology q 6 months  Facial FX 08-2019, lost L eye vision Hypothyroidism, diagnosed 08-2020. OSA Dx 09/01/2020.  PLAN Chart review: Admitted 02/03/2021 and discharged 02/11/2021 to inpatient rehab. Had a ground-level fall, diagnosed with: Concussion, TBI Left facial contusion Left facial laceration and abrasions Multiple L rib fractures Right middle lung lobe density Anticoagulated.  Admitting to inpatient rehab on discharge 02/28/2021: Had PT, ST, OT Received Ritalin while in-house. Some sundowning noted.  Rx Seroquel. Had bouts of constipation Hospital follow-up for above problems: Since he left the hospital and rehab he is at home, has excellent support, doing PT, OT and ST, no further falls. Meds: DC tramadol, does not needed DC Ritalin has not use it Wife and patient request to decrease Seroquel to previews dose of 25 mg.  Rx sent. Continue PT, ST and OT. Hypothyroidism: Check TSH, since he is started hormone replacement he was feeling great until he fell High cholesterol: Check FLP Abnormal CT lung 02-03-21  Elongated lobulated density in the right middle lobe has increased in size. This is indeterminate. Neoplasm cannot be excluded. Consider follow-up PET-CT or tissue sampling. At this point I do not think the patient is in a  condition to tolerate further eval, he and the wife agrees, will let pulmonary know about the most recent CT. RTC 3 months

## 2021-03-16 NOTE — Patient Instructions (Signed)
Proceed with a COVID-vaccine at your convenience    GO TO THE LAB : Get the blood work     [place], [place] back for a checkup in 3 months

## 2021-03-16 NOTE — Progress Notes (Signed)
Subjective:    Patient ID: Brandon Burke, male    DOB: 1931-06-21, 87 y.o.   MRN: [id number]  DOS:  03/16/2021 Type of visit - description: Follow-up, here w/ his wife  Since the last office visit had an acute admission and subsequently an inpatient rehab admission.  Extensive chart review, see A/P.  Since he left the rehab service he is at home, receiving PT, OT and ST almost daily. No further falls. No fever chills Bowel movements are regular Has a very good support system. Still is confused at times and has a decreased memory  Review of Systems See above   Past Medical History:  Diagnosis Date   Constipation    DVT (deep venous thrombosis) (HCC)    hx of 2001 (after prostate surgery)     Glaucoma    Hyperlipidemia    Hypertension    Melanoma ([place])    face, hand   Mitral valve prolapse    OSA (obstructive sleep apnea) 11/26/2020   PE (pulmonary embolism)    5-09:was referred to hematology, and they recommend to discontinue Coumadin 5/11   Prostate cancer ([place])     released from urology in 2010,needs yearly PSAs with PCP   Seasonal allergies     Past Surgical History:  Procedure Laterality Date   CATARACT EXTRACTION Bilateral    COLONOSCOPY     several   EYE SURGERY Bilateral    Cat Sx   PARS PLANA VITRECTOMY Left 09/06/2019   Procedure: PARS PLANA VITRECTOMY WITH 25 GAUGE WITH INTRAVITREAL ANTIBIOTICS;  Surgeon: [name], MD;  Location: [facility];  Service: Ophthalmology;  Laterality: Left;   PHOTOCOAGULATION WITH LASER Left 09/06/2019   Procedure: Photocoagulation With Laser;  Surgeon: [name], MD;  Location: [facility];  Service: Ophthalmology;  Laterality: Left;   PROSTATECTOMY  2000   RUPTURED GLOBE EXPLORATION AND REPAIR Left 08/16/2019   Procedure: OPEN GLOBE EXPLORATION EYE POSSIBLE ANTERIOR  CHAMBER [place] OUT;  Surgeon: Lonia Skinner, MD;  Location: [facility];  Service: Ophthalmology;  Laterality: Left;    Allergies as of 03/16/2021       Reactions    Misc. Sulfonamide Containing Compounds    Nitrofuran Derivatives Other (See Comments)   Caused headaches   Nitrofuran Derivatives    Sulfonamide Derivatives Nausea And Vomiting        Medication List        Accurate as of March 16, 2021  9:00 PM. If you have any questions, ask your nurse or doctor.          STOP taking these medications    methylphenidate 5 MG tablet Commonly known as: RITALIN Stopped by: [name], MD   Moderna COVID-19 Vaccine 100 MCG/0.5ML injection Generic drug: COVID-19 mRNA vaccine (Moderna) Stopped by: [name], MD   traMADol 50 MG tablet Commonly known as: ULTRAM Stopped by: [name], MD       TAKE these medications    acetaminophen 325 MG tablet Commonly known as: TYLENOL Take 2 tablets (650 mg total) by mouth every 6 (six) hours as needed for mild pain.   apixaban 2.5 MG Tabs tablet Commonly known as: Eliquis Take 1 tablet (2.5 mg total) by mouth 2 (two) times daily.   ARTIFICIAL TEAR OP Place 1 drop into both eyes as needed (dry eyes).   atenolol 50 MG tablet Commonly known as: TENORMIN Take 1 tablet (50 mg total) by mouth daily.   Azelastine HCl 0.15 % Soln Place 2 sprays  into both nostrils 2 (two) times daily.   calcium carbonate 500 MG chewable tablet Commonly known as: TUMS - dosed in mg elemental calcium Chew 1-2 tablets by mouth as needed for indigestion or heartburn.   docusate sodium 100 MG capsule Commonly known as: COLACE Take 1 capsule (100 mg total) by mouth 2 (two) times daily.   dorzolamide-timolol 22.3-6.8 MG/ML ophthalmic solution Commonly known as: COSOPT Place 1 drop into both eyes 2 (two) times daily.   fluocinonide 0.05 % external solution Commonly known as: LIDEX Apply topically.   fluticasone 50 MCG/ACT nasal spray Commonly known as: FLONASE Place 2 sprays into both nostrils daily as needed for allergies or rhinitis.   levothyroxine 25 MCG tablet Commonly known as: SYNTHROID Take 1  tablet (25 mcg total) by mouth in the morning and at bedtime.   lidocaine 5 % Commonly known as: LIDODERM Place 1 patch onto the skin daily. Remove & Discard patch within 12 hours or as directed by MD   loratadine 10 MG tablet Commonly known as: CLARITIN Take 10 mg by mouth daily.   melatonin 5 MG Tabs Take 5 mg by mouth at bedtime.   methocarbamol 500 MG tablet Commonly known as: ROBAXIN Take 1 tablet (500 mg total) by mouth every 8 (eight) hours as needed for muscle spasms.   multivitamin tablet Take 1 tablet by mouth daily.   polyethylene glycol 17 g packet Commonly known as: MIRALAX / GLYCOLAX Take 17 g by mouth daily.   pravastatin 40 MG tablet Commonly known as: PRAVACHOL Take 1 tablet (40 mg total) by mouth at bedtime.   QUEtiapine 25 MG tablet Commonly known as: SEROQUEL Take 1 tablet (25 mg total) by mouth at bedtime. What changed:  medication strength how much to take Changed by: [name], MD   tamsulosin 0.4 MG Caps capsule Commonly known as: FLOMAX Take 1 capsule (0.4 mg total) by mouth daily after supper.   Travoprost (BAK Free) 0.004 % Soln ophthalmic solution Commonly known as: TRAVATAN Place 1 drop into both eyes at bedtime.   Vitamin D3 25 MCG (1000 UT) Caps Take 1 capsule (1,000 Units total) by mouth daily.           Objective:   Physical Exam BP 126/62 (BP Location: Left Arm, Patient Position: Sitting, Cuff Size: Small)   Pulse 67   Temp 98 F (36.7 C) (Oral)   Resp 18   Ht 6' (1.829 m)   Wt 169 lb 8 oz (76.9 kg)   SpO2 97%   BMI 22.99 kg/m  General:   Well developed, NAD, BMI noted. HEENT:  Normocephalic . Face symmetric, atraumatic Lungs:  CTA B Normal respiratory effort, no intercostal retractions, no accessory muscle use. Heart: RRR,  no murmur.  Lower extremities: no pretibial edema bilaterally  Skin: Not pale. Not jaundice Neurologic:  alert & oriented to self but not in time.  He recognizes his wife and me.  Gait is  slow and assisted by a walker Speech normal.  Psych--   Cooperative with normal attention span and concentration.  Behavior appropriate.  Emotional at times     Assessment      Assessment Hyperlipidemia Prostate cancer, released from urology 2010, Rx yearly PSAs by PCP HEM: DVT 2001 after surgery; PE 2009, eval by hematology, they recommend DC Coumadin 2011.  Spontaneous right leg DVT 11/2019, anticoag x life (dose decreased to Eliquis 2.5 twice daily on 04/2020) H/o MVP-- on BB glaucoma Sees dermatology q 6 months  Facial FX 08-2019, lost L eye vision Hypothyroidism, diagnosed 08-2020. OSA Dx 09/01/2020.  PLAN Chart review: Admitted 02/03/2021 and discharged 02/11/2021 to inpatient rehab. Had a ground-level fall, diagnosed with: Concussion, TBI Left facial contusion Left facial laceration and abrasions Multiple L rib fractures Right middle lung lobe density Anticoagulated.  Admitting to inpatient rehab on discharge 02/28/2021: Had PT, ST, OT Received Ritalin while in-house. Some sundowning noted.  Rx Seroquel. Had bouts of constipation Hospital follow-up for above problems: Since he left the hospital and rehab he is at home, has excellent support, doing PT, OT and ST, no further falls. Meds: DC tramadol, does not needed DC Ritalin has not use it Wife and patient request to decrease Seroquel to previews dose of 25 mg.  Rx sent. Continue PT, ST and OT. Hypothyroidism: Check TSH, since he is started hormone replacement he was feeling great until he fell High cholesterol: Check FLP Abnormal CT lung 02-03-21  Elongated lobulated density in the right middle lobe has increased in size. This is indeterminate. Neoplasm cannot be excluded. Consider follow-up PET-CT or tissue sampling. At this point I do not think the patient is in a  condition to tolerate further eval, he and the wife agrees, will let pulmonary know about the most recent CT. RTC 3 months       This visit  occurred during the SARS-CoV-2 public health emergency.  Safety protocols were in place, including screening questions prior to the visit, additional usage of staff PPE, and extensive cleaning of exam room while observing appropriate contact time as indicated for disinfecting solutions.

## 2021-03-17 DIAGNOSIS — I2699 Other pulmonary embolism without acute cor pulmonale: Secondary | ICD-10-CM | POA: Diagnosis not present

## 2021-03-17 DIAGNOSIS — Z86718 Personal history of other venous thrombosis and embolism: Secondary | ICD-10-CM | POA: Diagnosis not present

## 2021-03-17 DIAGNOSIS — M6281 Muscle weakness (generalized): Secondary | ICD-10-CM | POA: Diagnosis not present

## 2021-03-17 DIAGNOSIS — Z8546 Personal history of malignant neoplasm of prostate: Secondary | ICD-10-CM | POA: Diagnosis not present

## 2021-03-17 DIAGNOSIS — M1990 Unspecified osteoarthritis, unspecified site: Secondary | ICD-10-CM | POA: Diagnosis not present

## 2021-03-17 DIAGNOSIS — R2681 Unsteadiness on feet: Secondary | ICD-10-CM | POA: Diagnosis not present

## 2021-03-17 DIAGNOSIS — R2689 Other abnormalities of gait and mobility: Secondary | ICD-10-CM | POA: Diagnosis not present

## 2021-03-17 DIAGNOSIS — E785 Hyperlipidemia, unspecified: Secondary | ICD-10-CM | POA: Diagnosis not present

## 2021-03-18 DIAGNOSIS — Z86718 Personal history of other venous thrombosis and embolism: Secondary | ICD-10-CM | POA: Diagnosis not present

## 2021-03-18 DIAGNOSIS — Z8546 Personal history of malignant neoplasm of prostate: Secondary | ICD-10-CM | POA: Diagnosis not present

## 2021-03-18 DIAGNOSIS — E785 Hyperlipidemia, unspecified: Secondary | ICD-10-CM | POA: Diagnosis not present

## 2021-03-18 DIAGNOSIS — I2699 Other pulmonary embolism without acute cor pulmonale: Secondary | ICD-10-CM | POA: Diagnosis not present

## 2021-03-18 DIAGNOSIS — R2681 Unsteadiness on feet: Secondary | ICD-10-CM | POA: Diagnosis not present

## 2021-03-18 DIAGNOSIS — R2689 Other abnormalities of gait and mobility: Secondary | ICD-10-CM | POA: Diagnosis not present

## 2021-03-18 DIAGNOSIS — M6281 Muscle weakness (generalized): Secondary | ICD-10-CM | POA: Diagnosis not present

## 2021-03-18 DIAGNOSIS — M1990 Unspecified osteoarthritis, unspecified site: Secondary | ICD-10-CM | POA: Diagnosis not present

## 2021-03-19 DIAGNOSIS — R2689 Other abnormalities of gait and mobility: Secondary | ICD-10-CM | POA: Diagnosis not present

## 2021-03-19 DIAGNOSIS — E785 Hyperlipidemia, unspecified: Secondary | ICD-10-CM | POA: Diagnosis not present

## 2021-03-19 DIAGNOSIS — R2681 Unsteadiness on feet: Secondary | ICD-10-CM | POA: Diagnosis not present

## 2021-03-19 DIAGNOSIS — I2699 Other pulmonary embolism without acute cor pulmonale: Secondary | ICD-10-CM | POA: Diagnosis not present

## 2021-03-19 DIAGNOSIS — M6281 Muscle weakness (generalized): Secondary | ICD-10-CM | POA: Diagnosis not present

## 2021-03-19 DIAGNOSIS — Z8546 Personal history of malignant neoplasm of prostate: Secondary | ICD-10-CM | POA: Diagnosis not present

## 2021-03-19 DIAGNOSIS — Z86718 Personal history of other venous thrombosis and embolism: Secondary | ICD-10-CM | POA: Diagnosis not present

## 2021-03-19 DIAGNOSIS — M1990 Unspecified osteoarthritis, unspecified site: Secondary | ICD-10-CM | POA: Diagnosis not present

## 2021-03-20 DIAGNOSIS — L602 Onychogryphosis: Secondary | ICD-10-CM | POA: Diagnosis not present

## 2021-03-20 DIAGNOSIS — L84 Corns and callosities: Secondary | ICD-10-CM | POA: Diagnosis not present

## 2021-03-20 DIAGNOSIS — M79672 Pain in left foot: Secondary | ICD-10-CM | POA: Diagnosis not present

## 2021-03-20 DIAGNOSIS — M79671 Pain in right foot: Secondary | ICD-10-CM | POA: Diagnosis not present

## 2021-03-22 DIAGNOSIS — Z8546 Personal history of malignant neoplasm of prostate: Secondary | ICD-10-CM | POA: Diagnosis not present

## 2021-03-22 DIAGNOSIS — M1990 Unspecified osteoarthritis, unspecified site: Secondary | ICD-10-CM | POA: Diagnosis not present

## 2021-03-22 DIAGNOSIS — M6281 Muscle weakness (generalized): Secondary | ICD-10-CM | POA: Diagnosis not present

## 2021-03-22 DIAGNOSIS — Z86718 Personal history of other venous thrombosis and embolism: Secondary | ICD-10-CM | POA: Diagnosis not present

## 2021-03-22 DIAGNOSIS — I2699 Other pulmonary embolism without acute cor pulmonale: Secondary | ICD-10-CM | POA: Diagnosis not present

## 2021-03-22 DIAGNOSIS — R2681 Unsteadiness on feet: Secondary | ICD-10-CM | POA: Diagnosis not present

## 2021-03-22 DIAGNOSIS — E785 Hyperlipidemia, unspecified: Secondary | ICD-10-CM | POA: Diagnosis not present

## 2021-03-22 DIAGNOSIS — R2689 Other abnormalities of gait and mobility: Secondary | ICD-10-CM | POA: Diagnosis not present

## 2021-03-23 DIAGNOSIS — R2681 Unsteadiness on feet: Secondary | ICD-10-CM | POA: Diagnosis not present

## 2021-03-23 DIAGNOSIS — E785 Hyperlipidemia, unspecified: Secondary | ICD-10-CM | POA: Diagnosis not present

## 2021-03-23 DIAGNOSIS — M6281 Muscle weakness (generalized): Secondary | ICD-10-CM | POA: Diagnosis not present

## 2021-03-23 DIAGNOSIS — I2699 Other pulmonary embolism without acute cor pulmonale: Secondary | ICD-10-CM | POA: Diagnosis not present

## 2021-03-23 DIAGNOSIS — Z86718 Personal history of other venous thrombosis and embolism: Secondary | ICD-10-CM | POA: Diagnosis not present

## 2021-03-23 DIAGNOSIS — R2689 Other abnormalities of gait and mobility: Secondary | ICD-10-CM | POA: Diagnosis not present

## 2021-03-23 DIAGNOSIS — M1990 Unspecified osteoarthritis, unspecified site: Secondary | ICD-10-CM | POA: Diagnosis not present

## 2021-03-23 DIAGNOSIS — Z8546 Personal history of malignant neoplasm of prostate: Secondary | ICD-10-CM | POA: Diagnosis not present

## 2021-03-24 DIAGNOSIS — M6281 Muscle weakness (generalized): Secondary | ICD-10-CM | POA: Diagnosis not present

## 2021-03-24 DIAGNOSIS — Z86718 Personal history of other venous thrombosis and embolism: Secondary | ICD-10-CM | POA: Diagnosis not present

## 2021-03-24 DIAGNOSIS — M1990 Unspecified osteoarthritis, unspecified site: Secondary | ICD-10-CM | POA: Diagnosis not present

## 2021-03-24 DIAGNOSIS — R2681 Unsteadiness on feet: Secondary | ICD-10-CM | POA: Diagnosis not present

## 2021-03-24 DIAGNOSIS — E785 Hyperlipidemia, unspecified: Secondary | ICD-10-CM | POA: Diagnosis not present

## 2021-03-24 DIAGNOSIS — Z8546 Personal history of malignant neoplasm of prostate: Secondary | ICD-10-CM | POA: Diagnosis not present

## 2021-03-24 DIAGNOSIS — R2689 Other abnormalities of gait and mobility: Secondary | ICD-10-CM | POA: Diagnosis not present

## 2021-03-24 DIAGNOSIS — I2699 Other pulmonary embolism without acute cor pulmonale: Secondary | ICD-10-CM | POA: Diagnosis not present

## 2021-03-25 DIAGNOSIS — Z86718 Personal history of other venous thrombosis and embolism: Secondary | ICD-10-CM | POA: Diagnosis not present

## 2021-03-25 DIAGNOSIS — M6281 Muscle weakness (generalized): Secondary | ICD-10-CM | POA: Diagnosis not present

## 2021-03-25 DIAGNOSIS — R2681 Unsteadiness on feet: Secondary | ICD-10-CM | POA: Diagnosis not present

## 2021-03-25 DIAGNOSIS — M1990 Unspecified osteoarthritis, unspecified site: Secondary | ICD-10-CM | POA: Diagnosis not present

## 2021-03-25 DIAGNOSIS — E785 Hyperlipidemia, unspecified: Secondary | ICD-10-CM | POA: Diagnosis not present

## 2021-03-25 DIAGNOSIS — R2689 Other abnormalities of gait and mobility: Secondary | ICD-10-CM | POA: Diagnosis not present

## 2021-03-25 DIAGNOSIS — Z8546 Personal history of malignant neoplasm of prostate: Secondary | ICD-10-CM | POA: Diagnosis not present

## 2021-03-25 DIAGNOSIS — I2699 Other pulmonary embolism without acute cor pulmonale: Secondary | ICD-10-CM | POA: Diagnosis not present

## 2021-03-27 DIAGNOSIS — E785 Hyperlipidemia, unspecified: Secondary | ICD-10-CM | POA: Diagnosis not present

## 2021-03-27 DIAGNOSIS — I2699 Other pulmonary embolism without acute cor pulmonale: Secondary | ICD-10-CM | POA: Diagnosis not present

## 2021-03-27 DIAGNOSIS — Z86718 Personal history of other venous thrombosis and embolism: Secondary | ICD-10-CM | POA: Diagnosis not present

## 2021-03-27 DIAGNOSIS — Z8546 Personal history of malignant neoplasm of prostate: Secondary | ICD-10-CM | POA: Diagnosis not present

## 2021-03-27 DIAGNOSIS — M6281 Muscle weakness (generalized): Secondary | ICD-10-CM | POA: Diagnosis not present

## 2021-03-27 DIAGNOSIS — R2681 Unsteadiness on feet: Secondary | ICD-10-CM | POA: Diagnosis not present

## 2021-03-27 DIAGNOSIS — R2689 Other abnormalities of gait and mobility: Secondary | ICD-10-CM | POA: Diagnosis not present

## 2021-03-27 DIAGNOSIS — M1990 Unspecified osteoarthritis, unspecified site: Secondary | ICD-10-CM | POA: Diagnosis not present

## 2021-03-30 DIAGNOSIS — R2681 Unsteadiness on feet: Secondary | ICD-10-CM | POA: Diagnosis not present

## 2021-03-30 DIAGNOSIS — R2689 Other abnormalities of gait and mobility: Secondary | ICD-10-CM | POA: Diagnosis not present

## 2021-03-30 DIAGNOSIS — Z86718 Personal history of other venous thrombosis and embolism: Secondary | ICD-10-CM | POA: Diagnosis not present

## 2021-03-30 DIAGNOSIS — Z8546 Personal history of malignant neoplasm of prostate: Secondary | ICD-10-CM | POA: Diagnosis not present

## 2021-03-30 DIAGNOSIS — M6281 Muscle weakness (generalized): Secondary | ICD-10-CM | POA: Diagnosis not present

## 2021-03-30 DIAGNOSIS — M1990 Unspecified osteoarthritis, unspecified site: Secondary | ICD-10-CM | POA: Diagnosis not present

## 2021-03-30 DIAGNOSIS — E785 Hyperlipidemia, unspecified: Secondary | ICD-10-CM | POA: Diagnosis not present

## 2021-03-30 DIAGNOSIS — I2699 Other pulmonary embolism without acute cor pulmonale: Secondary | ICD-10-CM | POA: Diagnosis not present

## 2021-03-31 DIAGNOSIS — I2699 Other pulmonary embolism without acute cor pulmonale: Secondary | ICD-10-CM | POA: Diagnosis not present

## 2021-03-31 DIAGNOSIS — M1990 Unspecified osteoarthritis, unspecified site: Secondary | ICD-10-CM | POA: Diagnosis not present

## 2021-03-31 DIAGNOSIS — M6281 Muscle weakness (generalized): Secondary | ICD-10-CM | POA: Diagnosis not present

## 2021-03-31 DIAGNOSIS — Z8546 Personal history of malignant neoplasm of prostate: Secondary | ICD-10-CM | POA: Diagnosis not present

## 2021-03-31 DIAGNOSIS — E785 Hyperlipidemia, unspecified: Secondary | ICD-10-CM | POA: Diagnosis not present

## 2021-03-31 DIAGNOSIS — Z86718 Personal history of other venous thrombosis and embolism: Secondary | ICD-10-CM | POA: Diagnosis not present

## 2021-03-31 DIAGNOSIS — R2681 Unsteadiness on feet: Secondary | ICD-10-CM | POA: Diagnosis not present

## 2021-03-31 DIAGNOSIS — R2689 Other abnormalities of gait and mobility: Secondary | ICD-10-CM | POA: Diagnosis not present

## 2021-04-01 DIAGNOSIS — Z86718 Personal history of other venous thrombosis and embolism: Secondary | ICD-10-CM | POA: Diagnosis not present

## 2021-04-01 DIAGNOSIS — Z8546 Personal history of malignant neoplasm of prostate: Secondary | ICD-10-CM | POA: Diagnosis not present

## 2021-04-01 DIAGNOSIS — R2681 Unsteadiness on feet: Secondary | ICD-10-CM | POA: Diagnosis not present

## 2021-04-01 DIAGNOSIS — M1990 Unspecified osteoarthritis, unspecified site: Secondary | ICD-10-CM | POA: Diagnosis not present

## 2021-04-01 DIAGNOSIS — R2689 Other abnormalities of gait and mobility: Secondary | ICD-10-CM | POA: Diagnosis not present

## 2021-04-01 DIAGNOSIS — E785 Hyperlipidemia, unspecified: Secondary | ICD-10-CM | POA: Diagnosis not present

## 2021-04-01 DIAGNOSIS — I2699 Other pulmonary embolism without acute cor pulmonale: Secondary | ICD-10-CM | POA: Diagnosis not present

## 2021-04-01 DIAGNOSIS — M6281 Muscle weakness (generalized): Secondary | ICD-10-CM | POA: Diagnosis not present

## 2021-04-02 DIAGNOSIS — L821 Other seborrheic keratosis: Secondary | ICD-10-CM | POA: Diagnosis not present

## 2021-04-02 DIAGNOSIS — L820 Inflamed seborrheic keratosis: Secondary | ICD-10-CM | POA: Diagnosis not present

## 2021-04-02 DIAGNOSIS — L814 Other melanin hyperpigmentation: Secondary | ICD-10-CM | POA: Diagnosis not present

## 2021-04-02 DIAGNOSIS — L570 Actinic keratosis: Secondary | ICD-10-CM | POA: Diagnosis not present

## 2021-04-13 NOTE — Telephone Encounter (Signed)
Received a notifcation from [employer] that the patient has returned his machine. "has returned his ibreeze machine today.  Patient's wife states he is unable to use machine due to a fall he had and now he has a concussion.  Patient's wife doesn't want her credit card charged anymore and is returning the machine. Patient's wife hasn't notified Dr. Brett Fairy at this time."

## 2021-04-14 ENCOUNTER — Encounter: Payer: Self-pay | Admitting: Internal Medicine

## 2021-04-15 ENCOUNTER — Other Ambulatory Visit: Payer: Self-pay | Admitting: Internal Medicine

## 2021-04-15 MED ORDER — ESCITALOPRAM OXALATE 5 MG PO TABS: 5.0000 mg | ORAL_TABLET | Freq: Every day | ORAL | 1 refills | Status: DC

## 2021-05-07 DIAGNOSIS — L814 Other melanin hyperpigmentation: Secondary | ICD-10-CM | POA: Diagnosis not present

## 2021-05-07 DIAGNOSIS — L578 Other skin changes due to chronic exposure to nonionizing radiation: Secondary | ICD-10-CM | POA: Diagnosis not present

## 2021-05-07 DIAGNOSIS — L821 Other seborrheic keratosis: Secondary | ICD-10-CM | POA: Diagnosis not present

## 2021-05-07 DIAGNOSIS — Z85068 Personal history of other malignant neoplasm of small intestine: Secondary | ICD-10-CM | POA: Diagnosis not present

## 2021-05-07 DIAGNOSIS — L570 Actinic keratosis: Secondary | ICD-10-CM | POA: Diagnosis not present

## 2021-05-07 DIAGNOSIS — Z872 Personal history of diseases of the skin and subcutaneous tissue: Secondary | ICD-10-CM | POA: Diagnosis not present

## 2021-05-10 ENCOUNTER — Encounter: Payer: Self-pay | Admitting: Internal Medicine

## 2021-05-11 ENCOUNTER — Other Ambulatory Visit: Payer: Self-pay | Admitting: Internal Medicine

## 2021-05-11 MED ORDER — ESCITALOPRAM OXALATE 10 MG PO TABS: 10.0000 mg | ORAL_TABLET | Freq: Every day | ORAL | 3 refills | Status: DC

## 2021-05-18 ENCOUNTER — Other Ambulatory Visit: Payer: Self-pay

## 2021-05-18 MED ORDER — ATENOLOL 50 MG PO TABS: 50.0000 mg | ORAL_TABLET | Freq: Every day | ORAL | 1 refills | Status: DC

## 2021-06-04 ENCOUNTER — Other Ambulatory Visit: Payer: Self-pay | Admitting: Internal Medicine

## 2021-06-04 DIAGNOSIS — Z872 Personal history of diseases of the skin and subcutaneous tissue: Secondary | ICD-10-CM | POA: Diagnosis not present

## 2021-06-04 DIAGNOSIS — L578 Other skin changes due to chronic exposure to nonionizing radiation: Secondary | ICD-10-CM | POA: Diagnosis not present

## 2021-06-04 DIAGNOSIS — L821 Other seborrheic keratosis: Secondary | ICD-10-CM | POA: Diagnosis not present

## 2021-06-04 DIAGNOSIS — L814 Other melanin hyperpigmentation: Secondary | ICD-10-CM | POA: Diagnosis not present

## 2021-06-04 DIAGNOSIS — L570 Actinic keratosis: Secondary | ICD-10-CM | POA: Diagnosis not present

## 2021-06-05 DIAGNOSIS — M79671 Pain in right foot: Secondary | ICD-10-CM | POA: Diagnosis not present

## 2021-06-05 DIAGNOSIS — M79672 Pain in left foot: Secondary | ICD-10-CM | POA: Diagnosis not present

## 2021-06-05 DIAGNOSIS — L602 Onychogryphosis: Secondary | ICD-10-CM | POA: Diagnosis not present

## 2021-06-05 DIAGNOSIS — L84 Corns and callosities: Secondary | ICD-10-CM | POA: Diagnosis not present

## 2021-06-16 ENCOUNTER — Ambulatory Visit: Payer: PPO | Admitting: Internal Medicine

## 2021-06-16 DIAGNOSIS — R41841 Cognitive communication deficit: Secondary | ICD-10-CM | POA: Diagnosis not present

## 2021-06-18 DIAGNOSIS — R41841 Cognitive communication deficit: Secondary | ICD-10-CM | POA: Diagnosis not present

## 2021-06-22 DIAGNOSIS — H16212 Exposure keratoconjunctivitis, left eye: Secondary | ICD-10-CM | POA: Diagnosis not present

## 2021-06-22 DIAGNOSIS — H02423 Myogenic ptosis of bilateral eyelids: Secondary | ICD-10-CM | POA: Diagnosis not present

## 2021-06-22 DIAGNOSIS — H4032X3 Glaucoma secondary to eye trauma, left eye, severe stage: Secondary | ICD-10-CM | POA: Diagnosis not present

## 2021-06-22 DIAGNOSIS — H401111 Primary open-angle glaucoma, right eye, mild stage: Secondary | ICD-10-CM | POA: Diagnosis not present

## 2021-06-23 ENCOUNTER — Ambulatory Visit (INDEPENDENT_AMBULATORY_CARE_PROVIDER_SITE_OTHER): Payer: PPO | Admitting: Internal Medicine

## 2021-06-23 ENCOUNTER — Encounter: Payer: Self-pay | Admitting: Internal Medicine

## 2021-06-23 VITALS — BP 124/60 | HR 67 | Temp 98.0°F | Resp 18 | Ht 72.0 in | Wt 166.0 lb

## 2021-06-23 DIAGNOSIS — F320 Major depressive disorder, single episode, mild: Secondary | ICD-10-CM

## 2021-06-23 DIAGNOSIS — D62 Acute posthemorrhagic anemia: Secondary | ICD-10-CM | POA: Diagnosis not present

## 2021-06-23 DIAGNOSIS — E039 Hypothyroidism, unspecified: Secondary | ICD-10-CM

## 2021-06-23 DIAGNOSIS — I10 Essential (primary) hypertension: Secondary | ICD-10-CM

## 2021-06-23 DIAGNOSIS — Z8546 Personal history of malignant neoplasm of prostate: Secondary | ICD-10-CM | POA: Diagnosis not present

## 2021-06-23 DIAGNOSIS — S069X1D Unspecified intracranial injury with loss of consciousness of 30 minutes or less, subsequent encounter: Secondary | ICD-10-CM | POA: Diagnosis not present

## 2021-06-23 DIAGNOSIS — G3184 Mild cognitive impairment, so stated: Secondary | ICD-10-CM | POA: Diagnosis not present

## 2021-06-23 MED ORDER — ESCITALOPRAM OXALATE 10 MG PO TABS: 15.0000 mg | ORAL_TABLET | Freq: Every day | ORAL | 1 refills | Status: DC

## 2021-06-23 NOTE — Patient Instructions (Addendum)
Remember that you do not have to take Seroquel every night, only if you cannot sleep.  Increase Lexapro 10 mg to 1.5 tablets daily  GO TO THE LAB : Get the blood work     [place], [place] back for a checkup in 2 months

## 2021-06-23 NOTE — Progress Notes (Signed)
Subjective:    Patient ID: Brandon Burke, male    DOB: [birth year], 88 y.o.   MRN: [id number]  DOS:  06/23/2021 Type of visit - description: Follow-up, here with his wife.  We talk about his chronic medical problems and multiple other issues.  He had a traumatic brain injury last year, he is not back to baseline. Memory has decreased, he is feeling somewhat depressed. He is easily distractible.  He feels also quite fatigued, could fall asleep very easily.   Review of Systems Denies chest pain or difficulty breathing No lower extremity edema or palpitations No headaches No suicidal ideas No nausea or vomiting. No additional falls since the last visit   Past Medical History:  Diagnosis Date   Constipation    DVT (deep venous thrombosis) (HCC)    hx of 2001 (after prostate surgery)     Glaucoma    Hyperlipidemia    Hypertension    Melanoma ([place])    face, hand   Mitral valve prolapse    OSA (obstructive sleep apnea) 11/26/2020   PE (pulmonary embolism)    5-09:was referred to hematology, and they recommend to discontinue Coumadin 5/11   Prostate cancer ([place])     released from urology in 2010,needs yearly PSAs with PCP   Seasonal allergies     Past Surgical History:  Procedure Laterality Date   CATARACT EXTRACTION Bilateral    COLONOSCOPY     several   EYE SURGERY Bilateral    Cat Sx   PARS PLANA VITRECTOMY Left 09/06/2019   Procedure: PARS PLANA VITRECTOMY WITH 25 GAUGE WITH INTRAVITREAL ANTIBIOTICS;  Surgeon: [name], MD;  Location: [facility];  Service: Ophthalmology;  Laterality: Left;   PHOTOCOAGULATION WITH LASER Left 09/06/2019   Procedure: Photocoagulation With Laser;  Surgeon: [name], MD;  Location: [facility];  Service: Ophthalmology;  Laterality: Left;   PROSTATECTOMY  2000   RUPTURED GLOBE EXPLORATION AND REPAIR Left 08/16/2019   Procedure: OPEN GLOBE EXPLORATION EYE POSSIBLE ANTERIOR  CHAMBER [place] OUT;  Surgeon: Lonia Skinner, MD;  Location: [facility];  Service: Ophthalmology;  Laterality: Left;    Current Outpatient Medications  Medication Instructions   acetaminophen (TYLENOL) 650 mg, Oral, Every 6 hours PRN   apixaban (ELIQUIS) 2.5 mg, Oral, 2 times daily   ARTIFICIAL TEAR OP 1 drop, Both Eyes, As needed   atenolol (TENORMIN) 50 mg, Oral, Daily   Azelastine HCl 0.15 % SOLN 2 sprays, 2 times daily   calcium carbonate (TUMS - DOSED IN MG ELEMENTAL CALCIUM) 500 MG chewable tablet 1-2 tablets, Oral, As needed   docusate sodium (COLACE) 100 mg, Oral, 2 times daily   dorzolamide-timolol (COSOPT) 22.3-6.8 MG/ML ophthalmic solution 1 drop, Both Eyes, 2 times daily   escitalopram (LEXAPRO) 10 MG tablet TAKE 1 TABLET BY MOUTH EVERY DAY   fluocinonide (LIDEX) 0.05 % external solution Apply topically.   fluticasone (FLONASE) 50 MCG/ACT nasal spray 2 sprays, Each Nare, Daily PRN   levothyroxine (SYNTHROID) 25 mcg, Oral, 2 times daily   lidocaine (LIDODERM) 5 % 1 patch, Transdermal, Every 24 hours, Remove & Discard patch within 12 hours or as directed by MD   loratadine (CLARITIN) 10 mg, Daily   melatonin 5 mg, Oral, Daily at bedtime   methocarbamol (ROBAXIN) 500 mg, Oral, Every 8 hours PRN   Multiple Vitamin (MULTIVITAMIN) tablet 1 tablet, Oral, Daily   polyethylene glycol (MIRALAX / GLYCOLAX) 17 g, Oral, Daily   pravastatin (PRAVACHOL) 40 mg, Oral, Daily  at bedtime   QUEtiapine (SEROQUEL) 25 mg, Oral, Daily at bedtime   tamsulosin (FLOMAX) 0.4 mg, Oral, Daily after supper   Travoprost, BAK Free, (TRAVATAN) 0.004 % SOLN ophthalmic solution 1 drop, Both Eyes, Daily at bedtime   Vitamin D3 1,000 Units, Oral, Daily       Objective:   Physical Exam BP 124/60 (BP Location: Left Arm, Patient Position: Sitting, Cuff Size: Small)    Pulse 67    Temp 98 F (36.7 C) (Oral)    Resp 18    Ht 6' (1.829 m)    Wt 166 lb (75.3 kg)    SpO2 97%    BMI 22.51 kg/m  General:   Well developed, NAD, BMI noted. HEENT:  Normocephalic . Face symmetric   Lungs:  CTA B Normal respiratory effort, no intercostal retractions, no accessory muscle use. Heart: RRR,  no murmur.  Lower extremities: no pretibial edema bilaterally  Skin: Not pale. Not jaundice Neurologic:  alert & oriented to self and place.  Knows who the president is, forgot his wife's birthday.  Speech normal, gait: Assisted by walker Psych--  Cognition and judgment appear intact.  Behavior appropriate. No anxious or depressed appearing.      Assessment       Assessment Hyperlipidemia Prostate cancer, released from urology 2010, Rx yearly PSAs by PCP HEM: DVT 2001 after surgery; PE 2009, eval by hematology, they recommend DC Coumadin 2011.  Spontaneous right leg DVT 11/2019, anticoag x life (dose decreased to Eliquis 2.5 twice daily on 04/2020) H/o MVP-- on BB glaucoma Sees dermatology q 6 months  Hypothyroidism, diagnosed 08-2020. OSA Dx 09/01/2020.unable to use a CPAP Facial FX 08-2019, lost L eye vision 01-2021: Fall, TBI, facial lacerations, multiple rib fractures,   PLAN History of DVTs on life long Eliquis. Hypothyroidism: On Synthroid, check TSH. TBI: Had a fall October 2022, had a concussion, TBI.  He finished PT OT, still doing speech therapy. Has multiple symptoms including lack of concentration, unable to follow some commands, fortunately no behavioral concerns. His memory has decreased as well. Recommend to continue speech therapy, reassess on RTC MCI: Memory is not the same since the TBI, rec to come back in 2 months, need to do an MMSE. Fatigue: Likely multifactorial, checking BMP, CBC. Depression:Started escitalopram December 2022, it was increased to 10 mg later on, patient would like better control.  We agreed to increase to Lexapro 15 mg daily.  Reassess periodically.  No suicidal ideas. Insomnia: Sleeps well, okay to change Seroquel to as needed only History of prostate cancer: Check PSA Med reconciliation: Not needed Robaxin or Flomax. RTC 2  months  This visit occurred during the SARS-CoV-2 public health emergency.  Safety protocols were in place, including screening questions prior to the visit, additional usage of staff PPE, and extensive cleaning of exam room while observing appropriate contact time as indicated for disinfecting solutions.

## 2021-06-24 LAB — CBC WITH DIFFERENTIAL/PLATELET
Basophils Absolute: 0.1 10*3/uL (ref 0.0–0.1)
Basophils Relative: 1.3 % (ref 0.0–3.0)
Eosinophils Absolute: 0.3 10*3/uL (ref 0.0–0.7)
Eosinophils Relative: 5.7 % — ABNORMAL HIGH (ref 0.0–5.0)
HCT: 42.5 % (ref 39.0–52.0)
Hemoglobin: 14.4 g/dL (ref 13.0–17.0)
Lymphocytes Relative: 19 % (ref 12.0–46.0)
Lymphs Abs: 1.1 10*3/uL (ref 0.7–4.0)
MCHC: 33.8 g/dL (ref 30.0–36.0)
MCV: 96.4 fl (ref 78.0–100.0)
Monocytes Absolute: 0.6 10*3/uL (ref 0.1–1.0)
Monocytes Relative: 11 % (ref 3.0–12.0)
Neutro Abs: 3.5 10*3/uL (ref 1.4–7.7)
Neutrophils Relative %: 63 % (ref 43.0–77.0)
Platelets: 168 10*3/uL (ref 150.0–400.0)
RBC: 4.41 Mil/uL (ref 4.22–5.81)
RDW: 13 % (ref 11.5–15.5)
WBC: 5.6 10*3/uL (ref 4.0–10.5)

## 2021-06-24 LAB — BASIC METABOLIC PANEL
BUN: 22 mg/dL (ref 6–23)
CO2: 32 mEq/L (ref 19–32)
Calcium: 9.7 mg/dL (ref 8.4–10.5)
Chloride: 104 mEq/L (ref 96–112)
Creatinine, Ser: 1.42 mg/dL (ref 0.40–1.50)
GFR: 44.43 mL/min — ABNORMAL LOW (ref >60.00)
Glucose, Bld: 76 mg/dL (ref 70–99)
Potassium: 4.3 mEq/L (ref 3.5–5.1)
Sodium: 140 mEq/L (ref 135–145)

## 2021-06-24 LAB — TSH: TSH: 2.18 u[IU]/mL (ref 0.35–5.50)

## 2021-06-24 LAB — PSA: PSA: 0 ng/mL — ABNORMAL LOW (ref 0.10–4.00)

## 2021-06-25 DIAGNOSIS — R41841 Cognitive communication deficit: Secondary | ICD-10-CM | POA: Diagnosis not present

## 2021-06-25 NOTE — Assessment & Plan Note (Signed)
History of DVTs on life long Eliquis. Hypothyroidism: On Synthroid, check TSH. TBI: Had a fall October 2022, had a concussion, TBI.  He finished PT OT, still doing speech therapy. Has multiple symptoms including lack of concentration, unable to follow some commands, fortunately no behavioral concerns. His memory has decreased as well. Recommend to continue speech therapy, reassess on RTC MCI: Memory is not the same since the TBI, rec to come back in 2 months, need to do an MMSE. Fatigue: Likely multifactorial, checking BMP, CBC. Depression:Started escitalopram December 2022, it was increased to 10 mg later on, patient would like better control.  We agreed to increase to Lexapro 15 mg daily.  Reassess periodically.  No suicidal ideas. Insomnia: Sleeps well, okay to change Seroquel to as needed only History of prostate cancer: Check PSA Med reconciliation: Not needed Robaxin or Flomax. RTC 2 months

## 2021-06-30 DIAGNOSIS — R41841 Cognitive communication deficit: Secondary | ICD-10-CM | POA: Diagnosis not present

## 2021-07-02 DIAGNOSIS — L821 Other seborrheic keratosis: Secondary | ICD-10-CM | POA: Diagnosis not present

## 2021-07-02 DIAGNOSIS — Z872 Personal history of diseases of the skin and subcutaneous tissue: Secondary | ICD-10-CM | POA: Diagnosis not present

## 2021-07-02 DIAGNOSIS — Z85068 Personal history of other malignant neoplasm of small intestine: Secondary | ICD-10-CM | POA: Diagnosis not present

## 2021-07-02 DIAGNOSIS — D229 Melanocytic nevi, unspecified: Secondary | ICD-10-CM | POA: Diagnosis not present

## 2021-07-02 DIAGNOSIS — L814 Other melanin hyperpigmentation: Secondary | ICD-10-CM | POA: Diagnosis not present

## 2021-07-02 DIAGNOSIS — R41841 Cognitive communication deficit: Secondary | ICD-10-CM | POA: Diagnosis not present

## 2021-07-02 DIAGNOSIS — L218 Other seborrheic dermatitis: Secondary | ICD-10-CM | POA: Diagnosis not present

## 2021-07-02 DIAGNOSIS — L570 Actinic keratosis: Secondary | ICD-10-CM | POA: Diagnosis not present

## 2021-07-06 ENCOUNTER — Telehealth: Payer: Self-pay

## 2021-07-06 NOTE — Telephone Encounter (Signed)
Plan of care signed and faxed back to [facility] at [phone]. Form sent for scanning.  ?

## 2021-07-07 DIAGNOSIS — R41841 Cognitive communication deficit: Secondary | ICD-10-CM | POA: Diagnosis not present

## 2021-07-09 DIAGNOSIS — R41841 Cognitive communication deficit: Secondary | ICD-10-CM | POA: Diagnosis not present

## 2021-07-14 DIAGNOSIS — R41841 Cognitive communication deficit: Secondary | ICD-10-CM | POA: Diagnosis not present

## 2021-07-29 MED ORDER — QUETIAPINE FUMARATE 25 MG PO TABS: 25.0000 mg | ORAL_TABLET | Freq: Every day | ORAL | 1 refills | Status: DC

## 2021-07-29 MED ORDER — APIXABAN 2.5 MG PO TABS: 2.5000 mg | ORAL_TABLET | Freq: Two times a day (BID) | ORAL | 0 refills | Status: DC

## 2021-07-29 NOTE — Telephone Encounter (Signed)
error 

## 2021-08-05 ENCOUNTER — Ambulatory Visit: Payer: PPO | Admitting: Internal Medicine

## 2021-08-06 DIAGNOSIS — L820 Inflamed seborrheic keratosis: Secondary | ICD-10-CM | POA: Diagnosis not present

## 2021-08-06 DIAGNOSIS — Z872 Personal history of diseases of the skin and subcutaneous tissue: Secondary | ICD-10-CM | POA: Diagnosis not present

## 2021-08-06 DIAGNOSIS — L218 Other seborrheic dermatitis: Secondary | ICD-10-CM | POA: Diagnosis not present

## 2021-08-06 DIAGNOSIS — Z85068 Personal history of other malignant neoplasm of small intestine: Secondary | ICD-10-CM | POA: Diagnosis not present

## 2021-08-06 DIAGNOSIS — D229 Melanocytic nevi, unspecified: Secondary | ICD-10-CM | POA: Diagnosis not present

## 2021-08-06 DIAGNOSIS — L570 Actinic keratosis: Secondary | ICD-10-CM | POA: Diagnosis not present

## 2021-08-10 ENCOUNTER — Other Ambulatory Visit: Payer: Self-pay

## 2021-08-10 MED ORDER — PRAVASTATIN SODIUM 40 MG PO TABS: 40.0000 mg | ORAL_TABLET | Freq: Every day | ORAL | 1 refills | Status: DC

## 2021-08-26 ENCOUNTER — Ambulatory Visit: Payer: PPO | Admitting: Internal Medicine

## 2021-08-28 ENCOUNTER — Encounter: Payer: Self-pay | Admitting: Internal Medicine

## 2021-08-28 ENCOUNTER — Ambulatory Visit (INDEPENDENT_AMBULATORY_CARE_PROVIDER_SITE_OTHER): Payer: PPO | Admitting: Internal Medicine

## 2021-08-28 VITALS — BP 124/68 | HR 68 | Temp 97.7°F | Resp 16 | Ht 72.0 in | Wt 167.2 lb

## 2021-08-28 DIAGNOSIS — S069X1D Unspecified intracranial injury with loss of consciousness of 30 minutes or less, subsequent encounter: Secondary | ICD-10-CM | POA: Diagnosis not present

## 2021-08-28 DIAGNOSIS — F320 Major depressive disorder, single episode, mild: Secondary | ICD-10-CM | POA: Diagnosis not present

## 2021-08-28 DIAGNOSIS — R9389 Abnormal findings on diagnostic imaging of other specified body structures: Secondary | ICD-10-CM

## 2021-08-28 NOTE — Patient Instructions (Addendum)
? ?  GO TO THE FRONT DESK, PLEASE SCHEDULE YOUR APPOINTMENTS ?Come back for   a physical exam in 2 months, will discuss your memory as well ?

## 2021-08-28 NOTE — Progress Notes (Signed)
? ?Subjective:  ? ? Patient ID: Brandon Burke, male    DOB: June 08, 1931, 88 y.o.   MRN: [id number] ? ?DOS:  08/28/2021 ?Type of visit - description: f/u ? ?Here with his wife ?Since the last visit overall feels better emotionally. ?No further falls, uses a cane. ?He continues feeling fatigue after minor activities, not a new sx. ?His memory is decreased, it fluctuates over time.  He has never stopped recognizing his wife.  No behavioral issues. ? ?No fever chills ?No chest pain no difficulty breathing ?No cough or sputum production ?No dysuria, gross hematuria or difficulty urinating ? ?Review of Systems ?See above  ? ?Past Medical History:  ?Diagnosis Date  ? Constipation   ? DVT (deep venous thrombosis) ([place])   ? hx of 2001 (after prostate surgery)    ? Glaucoma   ? Hyperlipidemia   ? Hypertension   ? Melanoma ([place])   ? face, hand  ? Mitral valve prolapse   ? OSA (obstructive sleep apnea) 11/26/2020  ? PE (pulmonary embolism)   ? 5-09:was referred to hematology, and they recommend to discontinue Coumadin 5/11  ? Prostate cancer ([place])   ?  released from urology in 2010,needs yearly PSAs with PCP  ? Seasonal allergies   ? ? ?Past Surgical History:  ?Procedure Laterality Date  ? CATARACT EXTRACTION Bilateral   ? COLONOSCOPY    ? several  ? EYE SURGERY Bilateral   ? Cat Sx  ? PARS PLANA VITRECTOMY Left 09/06/2019  ? Procedure: PARS PLANA VITRECTOMY WITH 25 GAUGE WITH INTRAVITREAL ANTIBIOTICS;  Surgeon: [name], MD;  Location: [facility];  Service: Ophthalmology;  Laterality: Left;  ? PHOTOCOAGULATION WITH LASER Left 09/06/2019  ? Procedure: Photocoagulation With Laser;  Surgeon: [name], MD;  Location: [facility];  Service: Ophthalmology;  Laterality: Left;  ? PROSTATECTOMY  2000  ? RUPTURED GLOBE EXPLORATION AND REPAIR Left 08/16/2019  ? Procedure: OPEN GLOBE EXPLORATION EYE POSSIBLE ANTERIOR  CHAMBER [place] OUT;  Surgeon: Lonia Skinner, MD;  Location: [facility];  Service: Ophthalmology;  Laterality: Left;  ? ? ?Current  Outpatient Medications  ?Medication Instructions  ? acetaminophen (TYLENOL) 650 mg, Oral, Every 6 hours PRN  ? apixaban (ELIQUIS) 2.5 mg, Oral, 2 times daily  ? ARTIFICIAL TEAR OP 1 drop, Both Eyes, As needed  ? atenolol (TENORMIN) 50 mg, Oral, Daily  ? calcium carbonate (TUMS - DOSED IN MG ELEMENTAL CALCIUM) 500 MG chewable tablet 1-2 tablets, Oral, As needed  ? docusate sodium (COLACE) 100 mg, Oral, 2 times daily  ? dorzolamide-timolol (COSOPT) 22.3-6.8 MG/ML ophthalmic solution 1 drop, Both Eyes, 2 times daily  ? escitalopram (LEXAPRO) 15 mg, Oral, Daily  ? fluticasone (FLONASE) 50 MCG/ACT nasal spray 2 sprays, Each Nare, Daily PRN  ? levothyroxine (SYNTHROID) 25 mcg, Oral, 2 times daily  ? Multiple Vitamin (MULTIVITAMIN) tablet 1 tablet, Oral, Daily  ? polyethylene glycol (MIRALAX / GLYCOLAX) 17 g, Oral, Daily  ? pravastatin (PRAVACHOL) 40 mg, Oral, Daily at bedtime  ? QUEtiapine (SEROQUEL) 25 mg, Oral, Daily at bedtime  ? Travoprost, BAK Free, (TRAVATAN) 0.004 % SOLN ophthalmic solution 1 drop, Both Eyes, Daily at bedtime  ? Vitamin D3 1,000 Units, Oral, Daily  ? ? ?   ?Objective:  ? Physical Exam ?BP 124/68 (BP Location: Left Arm, Patient Position: Sitting, Cuff Size: Small)   Pulse 68   Temp 97.7 ?F (36.5 ?C) (Oral)   Resp 16   Ht 6' (1.829 m)   Wt 167 lb  4 oz (75.9 kg)   SpO2 96%   BMI 22.68 kg/m?  ?General:   ?Well developed, NAD, BMI noted. ?HEENT:  ?Normocephalic . Face symmetric, atraumatic ?Lungs:  ?CTA B ?Normal respiratory effort, no intercostal retractions, no accessory muscle use. ?Heart: RRR,  no murmur.  ?Lower extremities: no pretibial edema bilaterally  ?Skin: Not pale. Not jaundice ?Neurologic:  ?alert.  No extensive memory exam today but he seems oriented x3. ?Speech normal, gait assisted by a cane.   ?Psych--  ?Behavior appropriate. ?No anxious or depressed appearing.  ? ?   ?Assessment   ? ?  ?Assessment ?Hyperlipidemia ?Prostate cancer, released from urology 2010, Rx yearly PSAs by  PCP ?HEM: ?DVT 2001 after surgery; PE 2009, eval by hematology, they recommend DC Coumadin 2011.  Spontaneous right leg DVT 11/2019, anticoag x life (dose decreased to Eliquis 2.5 twice daily on 04/2020) ?H/o MVP-- on BB ?glaucoma ?Sees dermatology q 6 months  ?Hypothyroidism, diagnosed 08-2020. ?OSA Dx 09/01/2020.unable to use a CPAP ?Facial FX 08-2019, lost L eye vision ?01-2021: Fall, TBI, facial lacerations, multiple rib fractures, ? ? ?PLAN ?Lifelong anticoagulation: History of DVTs before, at this point will continue with Eliquis, risk of fall has decreased, he is doing better. ?TBI: see LOV, overall he seems improving.  Still has fluctuating memory issues, reassess on RTC. ?Fatigue: Still reports fatigue with minor activities, no chest pain or difficulty breathing per se.  Latest labs reassuring.  Observation.  Likely multifactorial ?Abnormal CT chest 01-2021: At the time, he was in no condition to tolerate further evaluation, at this point he is doing better and we agreed to refer him back to pulmonary for possible further work-up vs observation.  Denies cough or hemoptysis. ?Depression: Improved, continue Lexapro 15 mg. ?Insomnia: Improved, taking Seroquel as needed only ?RTC 2 months CPX ? ?  ?

## 2021-08-29 NOTE — Assessment & Plan Note (Signed)
Lifelong anticoagulation: History of DVTs before, at this point will continue with Eliquis, risk of fall has decreased, he is doing better. ?TBI: see LOV, overall he seems improving.  Still has fluctuating memory issues, reassess on RTC. ?Fatigue: Still reports fatigue with minor activities, no chest pain or difficulty breathing per se.  Latest labs reassuring.  Observation.  Likely multifactorial ?Abnormal CT chest 01-2021: At the time, he was in no condition to tolerate further evaluation, at this point he is doing better and we agreed to refer him back to pulmonary for possible further work-up vs observation.  Denies cough or hemoptysis. ?Depression: Improved, continue Lexapro 15 mg. ?Insomnia: Improved, taking Seroquel as needed only ?RTC 2 months CPX ?

## 2021-08-31 ENCOUNTER — Encounter: Payer: Self-pay | Admitting: Internal Medicine

## 2021-09-03 ENCOUNTER — Encounter: Payer: Self-pay | Admitting: Emergency Medicine

## 2021-09-03 ENCOUNTER — Ambulatory Visit: Payer: PPO | Admitting: Emergency Medicine

## 2021-09-03 VITALS — BP 140/72 | HR 52 | Temp 97.6°F | Ht 72.0 in | Wt 168.0 lb

## 2021-09-03 DIAGNOSIS — R911 Solitary pulmonary nodule: Secondary | ICD-10-CM

## 2021-09-03 NOTE — Assessment & Plan Note (Signed)
Linear right middle lobe nodule that was first observed in June/2021 after a fall and chest trauma.  I looked at all the serial images from 01/30/2020 through 02/04/2021.  In general the right middle lobe linear opacity has been shrinking through 01/30/2021.  Unclear to me why it appears to be slightly increased in size on the 02/04/2021 film (only 5 days later) except for the possibility that he had a more shallow breath, more atelectasis, etc.  Any change in size over such a short time.  Will be inconsistent with malignancy.  I think we can repeat his CT in October for interval stability.  Then follow-up to review. ? ?We reviewed all of your old CT scans of the chest.  The right middle lobe opacity has been shrinking since 01/2020, more consistent with a scar.  The only exception is a slight increase in size on 02/04/2021 after you had sustained a fall. ?We will plan to repeat your CT scan in early October 2023 to follow for stability. ?Please follow with Dr. Lamonte Sakai in October after your CT scan so we can review the result together. ? ?

## 2021-09-03 NOTE — Patient Instructions (Signed)
We reviewed all of your old CT scans of the chest.  The right middle lobe opacity has been shrinking since 01/2020, more consistent with a scar.  The only exception is a slight increase in size on 02/04/2021 after you had sustained a fall. ?We will plan to repeat your CT scan in early October 2023 to follow for stability. ?Please follow with Dr. Lamonte Sakai in October after your CT scan so we can review the result together. ?

## 2021-09-03 NOTE — Progress Notes (Signed)
? ?Subjective:  ? ? Patient ID: Brandon Burke, male    DOB: 1931/09/23, 88 y.o.   MRN: [id number] ? ?HPI ?88 year old never smoker with a history of hypertension, prostate cancer, DVT/pulmonary embolism with recurrent DVT 11/2019, seasonal allergies.  He is here for evaluation of an abnormal CT scan of the chest. ?He suffered a fall and contusion of his left chest in early April.  Serial chest x-rays subsequent to that showed a R basilar  that ultimately prompted a CT scan of the chest 10/31/2019 which I have reviewed.  This shows a somewhat wedge-shaped subpleural area of consolidation in the lateral segment of the right middle lobe 2.8 x 2.2 cm with some additional cluster centrilobular nodules superiorly in the lateral segment. He was having cough and some chest pain after the fall, has now improved some.  ? ?ROV 02/01/20 --follow-up visit for 88 year old gentleman with a history of VTE, seasonal allergies, prostate CA, hypertension.  He has an abnormal CT chest in the aftermath of a traumatic fall and left chest contusion in April 2021.  We repeated his CT scan of the chest on 01/30/2020 and I have reviewed.  This shows improvement in the lateral right middle lobe opacity, stability of a 10 x 5 mm bilobed nodule in the medial right middle lobe, unchanged. ? ?ROV 09/02/21 --follow-up visit for 88 year old gentleman who has a history of hypertension, allergic rhinitis, VTE, prostate cancer.  I have seen him for an abnormal CT scan of the chest after he suffered a fall and left chest contusion in 08/2019.  He had a lateral right mid lobe opacity and a 10 x 5 mm bilobed right middle lobe nodule 01/2020.  It looks like a CT chest abdomen pelvis was done 01/2021 to follow the nodule.  There had been some possible increase in size to 3.1 x 1.0 cm but he had suffered a trauma and was in no condition to pursue tissue diagnosis or intervention at that time.  He has overall improved and is referred back now to discuss further  work-up. ? ? ?Review of Systems ?As per HPI ? ? ?   ?Objective:  ? Physical Exam ? ?Vitals:  ? 09/03/21 1639  ?BP: 140/72  ?Pulse: (!) 52  ?Temp: 97.6 ?F (36.4 ?C)  ?TempSrc: Oral  ?SpO2: 98%  ?Weight: 168 lb (76.2 kg)  ?Height: 6' (1.829 m)  ? ?Gen: Pleasant, thin elderly man, in no distress,  normal affect ? ?ENT: OP clear, his eye trauma has resolved ? ?Neck: No JVD, no stridor ? ?Lungs: No use of accessory muscles, no crackles or wheezing on normal respiration, no wheeze on forced expiration ? ?Cardiovascular: RRR, heart sounds normal, no murmur or gallops, no peripheral edema ? ?Musculoskeletal: No deformities, no cyanosis or clubbing ? ?Neuro: alert, awake, non focal ? ?Skin: Warm, no lesions or rash ? ? ?   ?Assessment & Plan:  ?Pulmonary nodule ?Linear right middle lobe nodule that was first observed in June/2021 after a fall and chest trauma.  I looked at all the serial images from 01/30/2020 through 02/04/2021.  In general the right middle lobe linear opacity has been shrinking through 01/30/2021.  Unclear to me why it appears to be slightly increased in size on the 02/04/2021 film (only 5 days later) except for the possibility that he had a more shallow breath, more atelectasis, etc.  Any change in size over such a short time.  Will be inconsistent with malignancy.  I think we can  repeat his CT in October for interval stability.  Then follow-up to review. ? ?We reviewed all of your old CT scans of the chest.  The right middle lobe opacity has been shrinking since 01/2020, more consistent with a scar.  The only exception is a slight increase in size on 02/04/2021 after you had sustained a fall. ?We will plan to repeat your CT scan in early October 2023 to follow for stability. ?Please follow with Dr. Lamonte Sakai in October after your CT scan so we can review the result together. ? ?Baltazar Apo, MD, PhD ?09/03/2021, 5:12 PM ?[facility] ?[phone] or if no answer [phone] ? ?

## 2021-09-04 ENCOUNTER — Other Ambulatory Visit: Payer: Self-pay

## 2021-09-04 MED ORDER — LEVOTHYROXINE SODIUM 25 MCG PO TABS: 25.0000 ug | ORAL_TABLET | Freq: Two times a day (BID) | ORAL | 1 refills | Status: DC

## 2021-09-09 ENCOUNTER — Encounter: Payer: Self-pay | Admitting: Internal Medicine

## 2021-09-10 ENCOUNTER — Telehealth: Payer: Self-pay | Admitting: Internal Medicine

## 2021-09-10 MED ORDER — FLUTICASONE PROPIONATE 50 MCG/ACT NA SUSP: 2.0000 | Freq: Every day | NASAL | 1 refills | Status: DC | PRN

## 2021-09-10 NOTE — Telephone Encounter (Signed)
Medication: fluticasone (FLONASE) 50 MCG/ACT nasal spray  ? ?  ? ?Has the patient contacted their pharmacy? No. ?(If no, request that the patient contact the pharmacy for the refill.) ?(If yes, when and what did the pharmacy advise?) ? ?  ? ?Preferred Pharmacy (with phone number or street name):  ?[facility] [address]  ?[place], [place] [address]  ?Phone:  [phone]  Fax:  [phone]  ?  ? ?Agent: Please be advised that RX refills may take up to 3 business days. We ask that you follow-up with your pharmacy.  ?

## 2021-09-10 NOTE — Telephone Encounter (Signed)
Rx sent 

## 2021-09-24 DIAGNOSIS — L218 Other seborrheic dermatitis: Secondary | ICD-10-CM | POA: Diagnosis not present

## 2021-09-24 DIAGNOSIS — Z872 Personal history of diseases of the skin and subcutaneous tissue: Secondary | ICD-10-CM | POA: Diagnosis not present

## 2021-09-24 DIAGNOSIS — L820 Inflamed seborrheic keratosis: Secondary | ICD-10-CM | POA: Diagnosis not present

## 2021-09-24 DIAGNOSIS — L570 Actinic keratosis: Secondary | ICD-10-CM | POA: Diagnosis not present

## 2021-09-24 DIAGNOSIS — D229 Melanocytic nevi, unspecified: Secondary | ICD-10-CM | POA: Diagnosis not present

## 2021-10-05 ENCOUNTER — Other Ambulatory Visit: Payer: Self-pay

## 2021-10-05 MED ORDER — APIXABAN 2.5 MG PO TABS
2.5000 mg | ORAL_TABLET | Freq: Two times a day (BID) | ORAL | 0 refills | Status: DC
Start: 2021-10-05 — End: 2022-01-21

## 2021-10-21 DIAGNOSIS — H4032X3 Glaucoma secondary to eye trauma, left eye, severe stage: Secondary | ICD-10-CM | POA: Diagnosis not present

## 2021-10-21 DIAGNOSIS — H16212 Exposure keratoconjunctivitis, left eye: Secondary | ICD-10-CM | POA: Diagnosis not present

## 2021-10-21 DIAGNOSIS — H401111 Primary open-angle glaucoma, right eye, mild stage: Secondary | ICD-10-CM | POA: Diagnosis not present

## 2021-10-21 DIAGNOSIS — H5213 Myopia, bilateral: Secondary | ICD-10-CM | POA: Diagnosis not present

## 2021-10-23 DIAGNOSIS — M79671 Pain in right foot: Secondary | ICD-10-CM | POA: Diagnosis not present

## 2021-10-23 DIAGNOSIS — L602 Onychogryphosis: Secondary | ICD-10-CM | POA: Diagnosis not present

## 2021-10-23 DIAGNOSIS — M79672 Pain in left foot: Secondary | ICD-10-CM | POA: Diagnosis not present

## 2021-10-23 DIAGNOSIS — L84 Corns and callosities: Secondary | ICD-10-CM | POA: Diagnosis not present

## 2021-11-05 ENCOUNTER — Encounter: Payer: PPO | Admitting: Internal Medicine

## 2021-11-05 DIAGNOSIS — Z872 Personal history of diseases of the skin and subcutaneous tissue: Secondary | ICD-10-CM | POA: Diagnosis not present

## 2021-11-05 DIAGNOSIS — Z85068 Personal history of other malignant neoplasm of small intestine: Secondary | ICD-10-CM | POA: Diagnosis not present

## 2021-11-05 DIAGNOSIS — L570 Actinic keratosis: Secondary | ICD-10-CM | POA: Diagnosis not present

## 2021-11-05 DIAGNOSIS — L820 Inflamed seborrheic keratosis: Secondary | ICD-10-CM | POA: Diagnosis not present

## 2021-11-05 DIAGNOSIS — D229 Melanocytic nevi, unspecified: Secondary | ICD-10-CM | POA: Diagnosis not present

## 2021-11-05 DIAGNOSIS — L821 Other seborrheic keratosis: Secondary | ICD-10-CM | POA: Diagnosis not present

## 2021-11-05 DIAGNOSIS — L814 Other melanin hyperpigmentation: Secondary | ICD-10-CM | POA: Diagnosis not present

## 2021-11-23 ENCOUNTER — Telehealth: Payer: Self-pay | Admitting: Emergency Medicine

## 2021-11-25 ENCOUNTER — Encounter: Payer: Self-pay | Admitting: Internal Medicine

## 2021-11-25 ENCOUNTER — Ambulatory Visit (INDEPENDENT_AMBULATORY_CARE_PROVIDER_SITE_OTHER): Payer: PPO | Admitting: Internal Medicine

## 2021-11-25 VITALS — BP 126/70 | HR 72 | Temp 98.4°F | Resp 18 | Ht 72.0 in | Wt 172.4 lb

## 2021-11-25 DIAGNOSIS — E039 Hypothyroidism, unspecified: Secondary | ICD-10-CM

## 2021-11-25 DIAGNOSIS — Z0001 Encounter for general adult medical examination with abnormal findings: Secondary | ICD-10-CM

## 2021-11-25 DIAGNOSIS — G3184 Mild cognitive impairment, so stated: Secondary | ICD-10-CM

## 2021-11-25 DIAGNOSIS — F325 Major depressive disorder, single episode, in full remission: Secondary | ICD-10-CM

## 2021-11-25 DIAGNOSIS — Z0000 Encounter for general adult medical examination without abnormal findings: Secondary | ICD-10-CM | POA: Diagnosis not present

## 2021-11-25 DIAGNOSIS — E785 Hyperlipidemia, unspecified: Secondary | ICD-10-CM | POA: Diagnosis not present

## 2021-11-25 DIAGNOSIS — F339 Major depressive disorder, recurrent, unspecified: Secondary | ICD-10-CM | POA: Insufficient documentation

## 2021-11-25 DIAGNOSIS — F0390 Unspecified dementia without behavioral disturbance: Secondary | ICD-10-CM

## 2021-11-25 DIAGNOSIS — I10 Essential (primary) hypertension: Secondary | ICD-10-CM | POA: Diagnosis not present

## 2021-11-25 MED ORDER — BUPROPION HCL ER (XL) 150 MG PO TB24: 150.0000 mg | ORAL_TABLET | Freq: Every day | ORAL | 0 refills | Status: DC

## 2021-11-25 NOTE — Assessment & Plan Note (Signed)
--  Td  2022 - pneumonia shot 2006, booster 08-2016; prevnar: 2016 - s/p zostavax;  S/p shingrex #1 and  #2 - covid shots booster recommended  --CCS:  no further screening   --S/P prostatectomy for prostate cancer around 2000, discharge from urology  2010, Rx  yearly PSAs; last PSA zero 06/2021 --  Advance directives: on file  --Labs: CMP, CBC, C88, folic acid, vitamin D, RPR, TSH

## 2021-11-25 NOTE — Patient Instructions (Addendum)
Increase Lexapro 10 mg to 2 tablets a day  Start Wellbutrin XL 150 mg 1 tablet daily.  This is a booster for Lexapro.  Watch for suicidal ideas, immediately seek help if needed   Recommend to proceed with covid booster (bivalent) at your pharmacy.  Flu shot this fall.   GO TO THE LAB : Get the blood work     [place], [place] back for a checkup in 2 months.   SUICIDE PREVENTION:  Find Help 24/7, [place] Call our 24-hour HelpLine at 719-548-5775 or [place], Suicide & Crisis Hotline 1-800-442-HOPE(4673)  The National Suicide Prevention Lifeline: 2038804916

## 2021-11-25 NOTE — Assessment & Plan Note (Signed)
Here for CPX Hypothyroidism: Check TSH. Fatigue: Although the patient said that he is feeling stronger, the wife reported that he is still fatigued.  ROS did not point to any specific etiology (except depression).  Plan: Labs to rule out sources of fatigue, I think it is multifactorial though Depression: Currently on Lexapro 15 mg daily, his main concern is that he feels he is a burden given his current state of health.  His vision is poor and he cannot drive. Listening therapy provided, unfortunately they do not have access to a counselor. Plan: Increase Lexapro to 20 mg, add Wellbutrin.  Suicidal prevention discussed.  See AVS MCI: Patient gets confused in the afternoons, likely MCI or mild dementia.  Will need a formal evaluation at some point, for now we will check appropriate labs to rule out primary etiologies and reassess once depression is better. I really did not like to introduce another medication today. Abnormal CT chest-2022 Saw pulmonary 09/03/2021, plan is to repeat CT October 2023. RTC 2 months.

## 2021-11-25 NOTE — Progress Notes (Signed)
Subjective:    Patient ID: Brandon Burke, male    DOB: 1931/08/16, 88 y.o.   MRN: [id number]  DOS:  11/25/2021 Type of visit - description: CPX  Here with his wife for CPX. The main concern from their wife is that he is still fatigued, wakes up tired and goes to bed tired. Denies chest pain or difficulty breathing.  No lower extremity edema No nausea vomiting; no blood in the stools.  Wife has noted that he is confused, mostly in the afternoons.  No hallucinations but he is kind of forgetful about where they are living. No behavioral issues, no aggressiveness.  Also he is feeling depressed, discouraged and " down". Suicidal ideas are very rare. The patient feels that he is a burden to others at this point in his life.  On Lexapro, not needing Seroquel for insomnia  Review of Systems  Other than above, a 14 point review of systems is negative     Past Medical History:  Diagnosis Date   Constipation    DVT (deep venous thrombosis) (HCC)    hx of 2001 (after prostate surgery)     Glaucoma    Hyperlipidemia    Hypertension    Melanoma ([place])    face, hand   Mitral valve prolapse    OSA (obstructive sleep apnea) 11/26/2020   PE (pulmonary embolism)    5-09:was referred to hematology, and they recommend to discontinue Coumadin 5/11   Prostate cancer ([place])     released from urology in 2010,needs yearly PSAs with PCP   Seasonal allergies     Past Surgical History:  Procedure Laterality Date   CATARACT EXTRACTION Bilateral    COLONOSCOPY     several   EYE SURGERY Bilateral    Cat Sx   PARS PLANA VITRECTOMY Left 09/06/2019   Procedure: PARS PLANA VITRECTOMY WITH 25 GAUGE WITH INTRAVITREAL ANTIBIOTICS;  Surgeon: [name], MD;  Location: [place];  Service: Ophthalmology;  Laterality: Left;   PHOTOCOAGULATION WITH LASER Left 09/06/2019   Procedure: Photocoagulation With Laser;  Surgeon: [name], MD;  Location: [place];  Service: Ophthalmology;  Laterality: Left;    PROSTATECTOMY  2000   RUPTURED GLOBE EXPLORATION AND REPAIR Left 08/16/2019   Procedure: OPEN GLOBE EXPLORATION EYE POSSIBLE ANTERIOR  CHAMBER [place] OUT;  Surgeon: Lonia Skinner, MD;  Location: [place];  Service: Ophthalmology;  Laterality: Left;   Social History   Socioeconomic History   Marital status: Married    Spouse name: Not on file   Number of children: 0   Years of education: Not on file   Highest education level: Not on file  Occupational History   Occupation: Retired    Fish farm manager: RETIRED  Tobacco Use   Smoking status: Never   Smokeless tobacco: Never  Vaping Use   Vaping Use: Never used  Substance and Sexual Activity   Alcohol use: No   Drug use: No   Sexual activity: Not on file  Other Topics Concern   Not on file  Social History Narrative    Married, wife w/ melanoma, advanced, doing well as off  10-2021   no children    Moved to [facility] 03-[place] (  at the independent area as off 10-2021)   No driving    Social Determinants of Health   Financial Resource Strain: Not on file  Food Insecurity: Not on file  Transportation Needs: Not on file  Physical Activity: Not on file  Stress: Not  on file  Social Connections: Not on file  Intimate Partner Violence: Not on file    Current Outpatient Medications  Medication Instructions   acetaminophen (TYLENOL) 650 mg, Oral, Every 6 hours PRN   apixaban (ELIQUIS) 2.5 mg, Oral, 2 times daily   ARTIFICIAL TEAR OP 1 drop, Both Eyes, As needed   atenolol (TENORMIN) 50 mg, Oral, Daily   buPROPion (WELLBUTRIN XL) 150 mg, Oral, Daily   calcium carbonate (TUMS - DOSED IN MG ELEMENTAL CALCIUM) 500 MG chewable tablet 1-2 tablets, Oral, As needed   docusate sodium (COLACE) 100 mg, Oral, 2 times daily   dorzolamide-timolol (COSOPT) 22.3-6.8 MG/ML ophthalmic solution 1 drop, Both Eyes, 2 times daily   escitalopram (LEXAPRO) 20 mg, Oral, Daily   fluticasone (FLONASE) 50 MCG/ACT nasal spray 2 sprays, Each Nare, Daily PRN    levothyroxine (SYNTHROID) 25 mcg, Oral, 2 times daily   Multiple Vitamin (MULTIVITAMIN) tablet 1 tablet, Oral, Daily   polyethylene glycol (MIRALAX / GLYCOLAX) 17 g, Oral, Daily   pravastatin (PRAVACHOL) 40 mg, Oral, Daily at bedtime   Travoprost, BAK Free, (TRAVATAN) 0.004 % SOLN ophthalmic solution 1 drop, Both Eyes, Daily at bedtime   Vitamin D3 1,000 Units, Oral, Daily       Objective:   Physical Exam BP 126/70   Pulse 72   Temp 98.4 F (36.9 C) (Oral)   Resp 18   Ht 6' (1.829 m)   Wt 172 lb 6 oz (78.2 kg)   SpO2 96%   BMI 23.38 kg/m  General:   Well developed, NAD, BMI noted.  HEENT:  Normocephalic . Face symmetric, atraumatic Lungs:  CTA B Normal respiratory effort, no intercostal retractions, no accessory muscle use. Heart: RRR,  no murmur.  Abdomen:  Not distended, soft, non-tender. No rebound or rigidity.   Skin: Not pale. Not jaundice Lower extremities: no pretibial edema bilaterally  Neurologic:  alert & oriented X3.  Some difficulty finding words Speech normal, gait assisted by cane Psych--  Cognition and judgment appear intact.  Cooperative with normal attention span and concentration.  Behavior appropriate. No anxious or depressed appearing.     Assessment     Assessment Hyperlipidemia Prostate cancer, released from urology 2010, Rx yearly PSAs by PCP HEM: DVT 2001 after surgery; PE 2009, eval by hematology, they recommend DC Coumadin 2011.  Spontaneous right leg DVT 11/2019, anticoag x life (dose decreased to Eliquis 2.5 twice daily on 04/2020) H/o MVP-- on BB glaucoma Sees dermatology q 6 months  Hypothyroidism, diagnosed 08-2020. OSA Dx 09/01/2020.unable to use a CPAP Facial FX 08-2019, lost L eye vision 01-2021: Fall, TBI, facial lacerations, multiple rib fractures, Poor vision  PLAN Here for CPX Hypothyroidism: Check TSH. Fatigue: Although the patient said that he is feeling stronger, the wife reported that he is still fatigued.  ROS did not  point to any specific etiology (except depression).  Plan: Labs to rule out sources of fatigue, I think it is multifactorial though Depression: Currently on Lexapro 15 mg daily, his main concern is that he feels he is a burden given his current state of health.  His vision is poor and he cannot drive. Listening therapy provided, unfortunately they do not have access to a counselor. Plan: Increase Lexapro to 20 mg, add Wellbutrin.  Suicidal prevention discussed.  See AVS MCI: Patient gets confused in the afternoons, likely MCI or mild dementia.  Will need a formal evaluation at some point, for now we will check appropriate labs to rule out  primary etiologies and reassess once depression is better. I really did not like to introduce another medication today. Abnormal CT chest-2022 Saw pulmonary 09/03/2021, plan is to repeat CT October 2023. RTC 2 months.  In addition to CPX, I addressed his chronic medical problems including fatigue, depression and   MCI.

## 2021-11-26 LAB — CBC WITH DIFFERENTIAL/PLATELET
Basophils Absolute: 0.1 10*3/uL (ref 0.0–0.1)
Basophils Relative: 1.2 % (ref 0.0–3.0)
Eosinophils Absolute: 0.3 10*3/uL (ref 0.0–0.7)
Eosinophils Relative: 5.4 % — ABNORMAL HIGH (ref 0.0–5.0)
HCT: 43.3 % (ref 39.0–52.0)
Hemoglobin: 15 g/dL (ref 13.0–17.0)
Lymphocytes Relative: 19.4 % (ref 12.0–46.0)
Lymphs Abs: 1 10*3/uL (ref 0.7–4.0)
MCHC: 34.6 g/dL (ref 30.0–36.0)
MCV: 97.2 fl (ref 78.0–100.0)
Monocytes Absolute: 0.5 10*3/uL (ref 0.1–1.0)
Monocytes Relative: 10.4 % (ref 3.0–12.0)
Neutro Abs: 3.2 10*3/uL (ref 1.4–7.7)
Neutrophils Relative %: 63.6 % (ref 43.0–77.0)
Platelets: 151 10*3/uL (ref 150.0–400.0)
RBC: 4.46 Mil/uL (ref 4.22–5.81)
RDW: 12.6 % (ref 11.5–15.5)
WBC: 5 10*3/uL (ref 4.0–10.5)

## 2021-11-26 LAB — COMPREHENSIVE METABOLIC PANEL
ALT: 19 U/L (ref 0–53)
AST: 22 U/L (ref 0–37)
Albumin: 4.2 g/dL (ref 3.5–5.2)
Alkaline Phosphatase: 81 U/L (ref 39–117)
BUN: 17 mg/dL (ref 6–23)
CO2: 28 mEq/L (ref 19–32)
Calcium: 9.1 mg/dL (ref 8.4–10.5)
Chloride: 105 mEq/L (ref 96–112)
Creatinine, Ser: 1.24 mg/dL (ref 0.40–1.50)
GFR: 52.12 mL/min — ABNORMAL LOW (ref >60.00)
Glucose, Bld: 128 mg/dL — ABNORMAL HIGH (ref 70–99)
Potassium: 4.6 mEq/L (ref 3.5–5.1)
Sodium: 141 mEq/L (ref 135–145)
Total Bilirubin: 0.4 mg/dL (ref 0.2–1.2)
Total Protein: 6.9 g/dL (ref 6.0–8.3)

## 2021-11-26 LAB — B12 AND FOLATE PANEL
Folate: >24.2 ng/mL (ref >5.9)
Vitamin B-12: 518 pg/mL (ref 211–911)

## 2021-11-26 LAB — RPR: RPR Ser Ql: NONREACTIVE

## 2021-11-26 LAB — VITAMIN D 25 HYDROXY (VIT D DEFICIENCY, FRACTURES): VITD: 42.72 ng/mL (ref 30.00–100.00)

## 2021-11-26 LAB — TSH: TSH: 1.52 u[IU]/mL (ref 0.35–5.50)

## 2021-12-03 DIAGNOSIS — L570 Actinic keratosis: Secondary | ICD-10-CM | POA: Diagnosis not present

## 2021-12-03 DIAGNOSIS — L821 Other seborrheic keratosis: Secondary | ICD-10-CM | POA: Diagnosis not present

## 2021-12-03 DIAGNOSIS — L579 Skin changes due to chronic exposure to nonionizing radiation, unspecified: Secondary | ICD-10-CM | POA: Diagnosis not present

## 2021-12-10 ENCOUNTER — Encounter: Payer: Self-pay | Admitting: Internal Medicine

## 2021-12-11 ENCOUNTER — Other Ambulatory Visit: Payer: Self-pay | Admitting: Internal Medicine

## 2021-12-11 NOTE — Telephone Encounter (Signed)
Dr Larose Kells: at 11/25/21 appointment pt was instructed to increase Lexapro 10 mg to 2 tablets a day and start Wellbutrin XL 150 mg 1 tablet daily. Now he is having these sxs. Please advise.

## 2021-12-14 ENCOUNTER — Other Ambulatory Visit: Payer: Self-pay

## 2021-12-14 MED ORDER — ATENOLOL 50 MG PO TABS: 50.0000 mg | ORAL_TABLET | Freq: Every day | ORAL | 1 refills | Status: DC

## 2021-12-16 NOTE — Telephone Encounter (Signed)
Seems like encounter was open in error so closing encounter.  

## 2021-12-17 ENCOUNTER — Ambulatory Visit: Payer: Self-pay | Admitting: Licensed Clinical Social Worker

## 2021-12-17 NOTE — Patient Outreach (Signed)
  Care Coordination   Initial Visit Note   12/17/2021 Name: Brandon Burke MRN: [id number] DOB: 1931-11-03  Brandon Burke is a 88 y.o. year old male who sees Larose Kells, Alda Berthold, MD for primary care. I spoke with  Brandon Burke / spouse of client, Brandon Burke, by phone today  What matters to the patients health and wellness today?  No Care Coordination needs noted at this time.     Goals Addressed             This Visit's Progress    patient has support of spouse. patient has medications.  patient is walking with use of a cane or a walker. No Care Coordination needs at present.       Care Coordination Interventions:  Active listening / Reflection utilized  Described Care Coordination program support with [facility], spouse of client Discussed client needs.  Discussed medication procurement. Discussed ambulation of client Brandon Burke was appreciative of call and said she would keep information if client needed support from this program in the future         SDOH assessments and interventions completed:  No     Care Coordination Interventions Activated:  No  Care Coordination Interventions:  No, not indicated   Follow up plan: No further intervention required.   Encounter Outcome:  Pt. Visit Completed

## 2021-12-17 NOTE — Patient Instructions (Addendum)
Visit Information  Thank you for taking time to visit with me today. Please don't hesitate to contact me if I can be of assistance to you before our next scheduled telephone appointment.  Following are the goals we discussed today:   No further interventions are needed at present  Please call the care guide team at [phone] if you need to cancel or reschedule your appointment.   If you are experiencing a Mental Health or [place] or need someone to talk to, please go to [facility] [place] [phone])   Following is a copy of your full plan of care:   Care Coordination Interventions:  Active listening / Reflection utilized  Described Care Coordination program support with [facility], spouse of client Discussed client needs.  Discussed medication procurement. Discussed ambulation of client Brandon Burke was appreciative of call and said she would keep information if client needed support from this program in the future   Brandon Burke was given information about Care Management services by the embedded care coordination team including:  Care Management services include personalized support from designated clinical staff supervised by his physician, including individualized plan of care and coordination with other care providers 24/7 contact phone numbers for assistance for urgent and routine care needs. The patient may stop CCM services at any time (effective at the end of the month) by phone call to the office staff.  Patient agreed to services and verbal consent obtained.   Norva Riffle.Kotaro Buer MSW, [place] representative [facility] Care Management [phone]

## 2021-12-25 ENCOUNTER — Other Ambulatory Visit: Payer: Self-pay

## 2021-12-25 MED ORDER — ESCITALOPRAM OXALATE 10 MG PO TABS: 20.0000 mg | ORAL_TABLET | Freq: Every day | ORAL | 1 refills | Status: DC

## 2021-12-30 ENCOUNTER — Other Ambulatory Visit: Payer: Self-pay

## 2021-12-30 MED ORDER — PRAVASTATIN SODIUM 40 MG PO TABS: 40.0000 mg | ORAL_TABLET | Freq: Every day | ORAL | 1 refills | Status: DC

## 2022-01-01 DIAGNOSIS — M79672 Pain in left foot: Secondary | ICD-10-CM | POA: Diagnosis not present

## 2022-01-01 DIAGNOSIS — L602 Onychogryphosis: Secondary | ICD-10-CM | POA: Diagnosis not present

## 2022-01-01 DIAGNOSIS — L84 Corns and callosities: Secondary | ICD-10-CM | POA: Diagnosis not present

## 2022-01-01 DIAGNOSIS — M79671 Pain in right foot: Secondary | ICD-10-CM | POA: Diagnosis not present

## 2022-01-06 ENCOUNTER — Other Ambulatory Visit (HOSPITAL_BASED_OUTPATIENT_CLINIC_OR_DEPARTMENT_OTHER): Payer: PPO

## 2022-01-08 ENCOUNTER — Other Ambulatory Visit (HOSPITAL_BASED_OUTPATIENT_CLINIC_OR_DEPARTMENT_OTHER): Payer: PPO

## 2022-01-13 ENCOUNTER — Ambulatory Visit (HOSPITAL_BASED_OUTPATIENT_CLINIC_OR_DEPARTMENT_OTHER)
Admission: RE | Admit: 2022-01-13 | Discharge: 2022-01-13 | Disposition: A | Discharge status: Home / Self Care | Payer: PPO | Source: Ambulatory Visit | Attending: Emergency Medicine | Admitting: Emergency Medicine

## 2022-01-13 DIAGNOSIS — R911 Solitary pulmonary nodule: Secondary | ICD-10-CM | POA: Diagnosis not present

## 2022-01-13 DIAGNOSIS — R918 Other nonspecific abnormal finding of lung field: Secondary | ICD-10-CM | POA: Diagnosis not present

## 2022-01-21 ENCOUNTER — Other Ambulatory Visit: Payer: Self-pay

## 2022-01-21 MED ORDER — APIXABAN 2.5 MG PO TABS: 2.5000 mg | ORAL_TABLET | Freq: Two times a day (BID) | ORAL | 0 refills | Status: DC

## 2022-01-27 ENCOUNTER — Encounter: Payer: Self-pay | Admitting: Internal Medicine

## 2022-01-27 ENCOUNTER — Ambulatory Visit (INDEPENDENT_AMBULATORY_CARE_PROVIDER_SITE_OTHER): Payer: PPO | Admitting: Internal Medicine

## 2022-01-27 VITALS — BP 132/62 | HR 64 | Temp 98.2°F | Resp 18 | Ht 72.0 in | Wt 166.4 lb

## 2022-01-27 DIAGNOSIS — F325 Major depressive disorder, single episode, in full remission: Secondary | ICD-10-CM | POA: Diagnosis not present

## 2022-01-27 DIAGNOSIS — F03B11 Unspecified dementia, moderate, with agitation: Secondary | ICD-10-CM | POA: Diagnosis not present

## 2022-01-27 DIAGNOSIS — Z23 Encounter for immunization: Secondary | ICD-10-CM | POA: Diagnosis not present

## 2022-01-27 DIAGNOSIS — R079 Chest pain, unspecified: Secondary | ICD-10-CM

## 2022-01-27 DIAGNOSIS — F03B0 Unspecified dementia, moderate, without behavioral disturbance, psychotic disturbance, mood disturbance, and anxiety: Secondary | ICD-10-CM | POA: Insufficient documentation

## 2022-01-27 MED ORDER — DONEPEZIL HCL 5 MG PO TABS: 5.0000 mg | ORAL_TABLET | Freq: Every day | ORAL | 1 refills | Status: DC

## 2022-01-27 NOTE — Patient Instructions (Addendum)
Start Aricept 5 mg: 1 tablet every day  Vaccines I recommend:  Covid booster RSV vaccine  Check the  blood pressure regularly BP GOAL is between 110/65 and  135/85. If it is consistently higher or lower, let me know       GO TO THE FRONT DESK, [place] back for checkup in 2 months

## 2022-01-27 NOTE — Progress Notes (Addendum)
Subjective:    Patient ID: Brandon Burke, male    DOB: 1932-03-09, 88 y.o.   MRN: [id number]  DOS:  01/27/2022 Type of visit - description: Follow-up here with his wife.  Follow-up from previous visit Was seen with fatigue: Unchanged.  Work-up negative Dementia: Getting worse, he is sometimes agitated, more confused. Also had chest pain, EMS was called on 01/15/2022. Location of the pain is unknown, it lasted few minutes, no associated sweats-nausea-SOB-LOC. EKG done by EMS: No acute changes.  Similar to previous EKGs.  No further symptoms.  Review of Systems See above   Past Medical History:  Diagnosis Date   Constipation    DVT (deep venous thrombosis) (HCC)    hx of 2001 (after prostate surgery)     Glaucoma    Hyperlipidemia    Hypertension    Melanoma ([place])    face, hand   Mitral valve prolapse    OSA (obstructive sleep apnea) 11/26/2020   PE (pulmonary embolism)    5-09:was referred to hematology, and they recommend to discontinue Coumadin 5/11   Prostate cancer ([place])     released from urology in 2010,needs yearly PSAs with PCP   Seasonal allergies     Past Surgical History:  Procedure Laterality Date   CATARACT EXTRACTION Bilateral    COLONOSCOPY     several   EYE SURGERY Bilateral    Cat Sx   PARS PLANA VITRECTOMY Left 09/06/2019   Procedure: PARS PLANA VITRECTOMY WITH 25 GAUGE WITH INTRAVITREAL ANTIBIOTICS;  Surgeon: [name], MD;  Location: [place];  Service: Ophthalmology;  Laterality: Left;   PHOTOCOAGULATION WITH LASER Left 09/06/2019   Procedure: Photocoagulation With Laser;  Surgeon: [name], MD;  Location: [place];  Service: Ophthalmology;  Laterality: Left;   PROSTATECTOMY  2000   RUPTURED GLOBE EXPLORATION AND REPAIR Left 08/16/2019   Procedure: OPEN GLOBE EXPLORATION EYE POSSIBLE ANTERIOR  CHAMBER [place] OUT;  Surgeon: Lonia Skinner, MD;  Location: [place];  Service: Ophthalmology;  Laterality: Left;    Current Outpatient Medications   Medication Instructions   acetaminophen (TYLENOL) 650 mg, Oral, Every 6 hours PRN   apixaban (ELIQUIS) 2.5 mg, Oral, 2 times daily   ARTIFICIAL TEAR OP 1 drop, Both Eyes, As needed   atenolol (TENORMIN) 50 mg, Oral, Daily   calcium carbonate (TUMS - DOSED IN MG ELEMENTAL CALCIUM) 500 MG chewable tablet 1-2 tablets, Oral, As needed   docusate sodium (COLACE) 100 mg, Oral, 2 times daily   dorzolamide-timolol (COSOPT) 22.3-6.8 MG/ML ophthalmic solution 1 drop, Both Eyes, 2 times daily   escitalopram (LEXAPRO) 20 mg, Oral, Daily   fluticasone (FLONASE) 50 MCG/ACT nasal spray 2 sprays, Each Nare, Daily PRN   levothyroxine (SYNTHROID) 25 mcg, Oral, 2 times daily   Multiple Vitamin (MULTIVITAMIN) tablet 1 tablet, Oral, Daily   polyethylene glycol (MIRALAX / GLYCOLAX) 17 g, Oral, Daily   pravastatin (PRAVACHOL) 40 mg, Oral, Daily at bedtime   Travoprost, BAK Free, (TRAVATAN) 0.004 % SOLN ophthalmic solution 1 drop, Both Eyes, Daily at bedtime   Vitamin D3 1,000 Units, Oral, Daily       Objective:   Physical Exam BP 132/62   Pulse 64   Temp 98.2 F (36.8 C) (Oral)   Resp 18   Ht 6' (1.829 m)   Wt 166 lb 6 oz (75.5 kg)   SpO2 96%   BMI 22.56 kg/m  General:   Well developed, NAD, BMI noted. HEENT:  Normocephalic . Face symmetric,  atraumatic Skin: Not pale. Not jaundice Neurologic:  MMSE: 16 Speech normal, gait appropriate for age and unassisted Psych--  Behavior appropriate. No anxious or depressed appearing.      Assessment     Assessment Hyperlipidemia Prostate cancer, released from urology 2010, Rx yearly PSAs by PCP HEM: DVT 2001 after surgery; PE 2009, eval by hematology, they recommend DC Coumadin 2011.  Spontaneous right leg DVT 11/2019, anticoag x life (dose decreased to Eliquis 2.5 twice daily on 04/2020) H/o MVP-- on BB glaucoma Sees dermatology q 6 months  Hypothyroidism, diagnosed 08-2020. OSA Dx 09/01/2020.unable to use a CPAP Facial FX 08-2019, lost L eye  vision 01-2021: Fall, TBI, facial lacerations, multiple rib fractures, Poor vision  PLAN Depression:  See last visit, Lexapro increased to 20 mg, tried Wellbutrin, had side effects, does not like to try again. At this point denies suicidal ideas but apparently had some between the last visit and today. Dementia: The patient has dementia, MMSE confirmatory.  Recent labs negative.  Behavioral issues include some agitation, repetitive questions, on-off confusion.   We will start Aricept, pros and cons discussed. Needs symptomatic treatment for behavioral issues, will consult  our clinical pharmacist to help with medical management.  CCM referral placed. Chest pain: See HPI, could have been angina, unable to tell without further eval.  Both the patient and the wife declined further eval. RTC 2 months  Time spent 35 min

## 2022-01-28 NOTE — Assessment & Plan Note (Signed)
Depression:  See last visit, Lexapro increased to 20 mg, tried Wellbutrin, had side effects, does not like to try again. At this point denies suicidal ideas but apparently had some between the last visit and today. Dementia: The patient has dementia, MMSE confirmatory.  Recent labs negative.  Behavioral issues include some agitation, repetitive questions, on-off confusion.   We will start Aricept, pros and cons discussed. Needs symptomatic treatment for behavioral issues, will consult  our clinical pharmacist to help with medical management.  CCM referral placed. Chest pain: See HPI, could have been angina, unable to tell without further eval.  Both the patient and the wife declined further eval. RTC 2 months

## 2022-01-29 ENCOUNTER — Telehealth: Payer: Self-pay | Admitting: *Deleted

## 2022-01-29 ENCOUNTER — Telehealth: Payer: Self-pay | Admitting: Internal Medicine

## 2022-01-29 NOTE — Chronic Care Management (AMB) (Signed)
  Care Management   Note  01/29/2022 Name: Brandon Burke MRN: [id number] DOB: July 01, 1931  Brandon Burke is a 88 y.o. year old male who is a primary care patient of Colon Branch, MD. I reached out to [name] by phone today offer care coordination services.   [name] was given information about care management services today including:  Care management services include personalized support from designated clinical staff supervised by his physician, including individualized plan of care and coordination with other care providers 24/7 contact phone numbers for assistance for urgent and routine care needs. The patient may stop care management services at any time by phone call to the office staff.  Patient agreed to services and verbal consent obtained.   Follow up plan: Telephone appointment with care management team member scheduled for: 02/01/2022  Julian Hy, [place] Direct Dial: [phone]

## 2022-01-29 NOTE — Telephone Encounter (Signed)
After reviewing the clinical pharmacist suggestions I called the wife and recommend the following: - He just started Aricept 5 mg, in 4 weeks she will call and let me know how he is doing.  As long as there is no side effects will increase to 10 mg. - Behavioral issues may improve with Aricept.  If they are not some options include Abilify 2 mg daily, Seroquel ER 25 mg daily, prazosin 1 mg daily and increase up to 5 mg daily (split twice daily) - Advised patient that all the medications for behavior have downsides and risks. -We agreed that she will call in 4 weeks or sooner if they  cannot tolerate agitation

## 2022-02-01 ENCOUNTER — Ambulatory Visit: Payer: PPO | Admitting: Pharmacist

## 2022-02-01 ENCOUNTER — Telehealth: Payer: Self-pay | Admitting: Pharmacist

## 2022-02-01 DIAGNOSIS — F325 Major depressive disorder, single episode, in full remission: Secondary | ICD-10-CM

## 2022-02-01 DIAGNOSIS — F03B11 Unspecified dementia, moderate, with agitation: Secondary | ICD-10-CM

## 2022-02-01 DIAGNOSIS — E039 Hypothyroidism, unspecified: Secondary | ICD-10-CM

## 2022-02-01 MED ORDER — LEVOTHYROXINE SODIUM 25 MCG PO TABS: 50.0000 ug | ORAL_TABLET | Freq: Every day | ORAL | 1 refills | Status: DC

## 2022-02-01 NOTE — Progress Notes (Signed)
Chronic Care Management Pharmacy Assistant   Name: Brandon Burke  MRN: [id number] DOB: [birth year]  Brandon Burke is an 88 y.o. year old male who presents for his initial CCM visit with the clinical pharmacist.   Recent office visits:  01/27/22 Brandon Burke E-Internal Medicine (Dementia) Orders: AMB Referral to [facility]; Medication changes: Donepezil HCI 5 mg  11/25/21 Brandon Burke E-Internal Medicine (Annual Exam) Orders: AMB Referral to [facility] Coordination; Medication changes: increase lexapro 10 mg 2 tab daily, start wellbutrin 150 mg daily  08/28/21  Brandon Burke E-Internal Medicine (Abnormal CT of the chest) Orders: AMB Referral to Pulmonary;  Medication changes: Patient reported not taking lidocaine or melatonin  Recent consult visits:  None noted  Hospital visits:  None in previous 6 months  Medications: Outpatient Encounter Medications as of 02/01/2022  Medication Sig   acetaminophen (TYLENOL) 325 MG tablet Take 2 tablets (650 mg total) by mouth every 6 (six) hours as needed for mild pain.   apixaban (ELIQUIS) 2.5 MG TABS tablet Take 1 tablet (2.5 mg total) by mouth 2 (two) times daily.   ARTIFICIAL TEAR OP Place 1 drop into both eyes as needed (dry eyes).   atenolol (TENORMIN) 50 MG tablet Take 1 tablet (50 mg total) by mouth daily.   calcium carbonate (TUMS - DOSED IN MG ELEMENTAL CALCIUM) 500 MG chewable tablet Chew 1-2 tablets by mouth as needed for indigestion or heartburn.   Cholecalciferol (VITAMIN D3) 25 MCG (1000 UT) CAPS Take 1 capsule (1,000 Units total) by mouth daily.   docusate sodium (COLACE) 100 MG capsule Take 1 capsule (100 mg total) by mouth 2 (two) times daily.   donepezil (ARICEPT) 5 MG tablet Take 1 tablet (5 mg total) by mouth at bedtime.   dorzolamide-timolol (COSOPT) 22.3-6.8 MG/ML ophthalmic solution Place 1 drop into both eyes 2 (two) times daily.   escitalopram (LEXAPRO) 10 MG tablet Take 2 tablets (20 mg total) by  mouth daily.   fluticasone (FLONASE) 50 MCG/ACT nasal spray Place 2 sprays into both nostrils daily as needed for allergies or rhinitis.   levothyroxine (SYNTHROID) 25 MCG tablet Take 1 tablet (25 mcg total) by mouth in the morning and at bedtime.   Multiple Vitamin (MULTIVITAMIN) tablet Take 1 tablet by mouth daily.   polyethylene glycol (MIRALAX / GLYCOLAX) 17 g packet Take 17 g by mouth daily.   pravastatin (PRAVACHOL) 40 MG tablet Take 1 tablet (40 mg total) by mouth at bedtime.   Travoprost, BAK Free, (TRAVATAN) 0.004 % SOLN ophthalmic solution Place 1 drop into both eyes at bedtime.   No facility-administered encounter medications on file as of 02/01/2022.   Medication List:  Acetaminophen (Tylenol) 325 mg tab Apixaban (Eliquis) 2.5 mg tabs tab-last fill 11/20/21 90 ds Artifical Tear OP  Atenolol (Tenormin) 50 mg tab-last fill 12/16/21 90 ds Calcium carbonate (Tums-Dosed in mg Elemental Calcium) 500 mg chewable tab Cholecalciferol (Vitamin D3) 25 mcg (1000 UT) caps Docusate sodium (Colace) 100 mg cap Donepezil (Aricept) 5 mg tab-last fill 01/27/22 30 ds Dorzolamide-timolol (Cosopt) 22.3-6.8 mg/ML ophthalmic solution-last fill 09/14/21 50 ds Esitalopram (Lexapro) 10 mg tab-last fill 10/25/21 90 ds Fluticasone (Flonase)50 mcg/act nasal spray-last fill 09/11/21 90 ds Levothyroxine (Synthroid) 25 mcg tab-last fill 12/14/21 90 ds Multiple Vitamin (Multivitamin) tab Polyethylene glycol (Miralax/Glycolax) 17 g packet Pravastatin (Pravachol) 40 mg tab-last fill 10/28/21 90 ds Travoprost, BAK free, (Travatan) 0.004% SOLN ophthalmic solution-last fill 10/26/21 75 ds    Care Gaps: Colonoscopy-NA Diabetic Foot  Exam-NA Ophthalmology-NA Dexa Scan - NA Annual Well Visit - 11/25/21 [facility]) Micro albumin-NA Hemoglobin A1c-NA  Star Rating Drugs: Pravastatin (Pravachol) 40 mg-last fill 10/28/21 90 ds, 08/18/21 90 ds  [place] [phone]

## 2022-02-01 NOTE — Progress Notes (Signed)
Pharmacy Note  02/01/2022 Name: Brandon Burke MRN: [id number] DOB: Aug 10, 1931  Subjective: Brandon Burke is a 88 y.o. year old male who is a primary care patient of Colon Branch, MD. Clinical Pharmacist Practitioner referral was placed to assist with medication management.    Engaged with patient's caregiver, wife Brandon Burke  for initial visit today.  Dementia - started donepezil '5mg'$  qhs last week. So far is tolerating well. Patient's wife reports that she has noted increase in confusing and agitation. Not specific timing of agitation. Both wife and patient has noticed this agitation and is concerning. Has history of TBI.  Depression - dose of escitalopram was increased from '15mg'$  daily to '20mg'$  daily last week. Caregiver reports mood is up and down. Patient is sleeping well at nigh - usually about 9 hours. Take a nap sometimes during day - usually 30 to 60 minutes.  Medication Management - There is some concern about using medications that can prolong QT interval. Patient had EKG done by EMS 01/15/2022 after experiencing chest pain. Per Dr Ethel Rana notes not abnormalities noted on EKG from EMS.  Also noted patient was taking levothyroxine 55mg twice a day. Wife was unsure why he was taking twice a day and did not recall if he had every taken just once daily.  Has history of DVT and PE. On chronic anticoagulation with Eliquis 2.'5mg'$  twice a day. Wife reports he is in Medicare Coverage gap.    Objective: Review of patient status, including review of consultants reports, laboratory and other test data, was performed as part of comprehensive evaluation and provision of chronic care management services.   Lab Results  Component Value Date   CREATININE 1.24 11/25/2021   CREATININE 1.42 06/23/2021   CREATININE 1.10 02/23/2021    No results found for: "HGBA1C"     Component Value Date/Time   CHOL 139 03/16/2021 1154   TRIG 264.0 (H) 03/16/2021 1154   TRIG 158 (H) 03/25/2006 0931    HDL 36.00 (L) 03/16/2021 1154   CHOLHDL 4 03/16/2021 1154   VLDL 52.8 (H) 03/16/2021 1154   LDLCALC 87 10/05/2019 0857   LDLDIRECT 71.0 03/16/2021 1154     Clinical ASCVD: No  The ASCVD Risk score (Arnett DK, et al., 2019) failed to calculate for the following reasons:   The 2019 ASCVD risk score is only valid for ages 458to 752   BP Readings from Last 3 Encounters:  01/27/22 132/62  11/25/21 126/70  09/03/21 140/72     Allergies  Allergen Reactions   Sulfonamide Derivatives Nausea And Vomiting   Nitrofuran Derivatives Other (See Comments)    Caused headaches     Medications Reviewed Today     Reviewed by ECherre Robins RPH-CPP (Pharmacist) on 02/01/22 at 1101  Med List Status: <None>   Medication Order Taking? Sig Documenting Provider Last Dose Status Informant  acetaminophen (TYLENOL) 325 MG tablet [id number] Take 2 tablets (650 mg total) by mouth every 6 (six) hours as needed for mild pain. ACathlyn Parsons PA-C Taking Active   apixaban (ELIQUIS) 2.5 MG TABS tablet [id number] Take 1 tablet (2.5 mg total) by mouth 2 (two) times daily. PColon Branch MD Taking Active   ARTIFICIAL TEAR OP [id number] Place 1 drop into both eyes as needed (dry eyes). [provider] Taking Active Multiple Informants  atenolol (TENORMIN) 50 MG tablet [id number] Take 1 tablet (50 mg total) by mouth daily. PColon Branch MD Taking Active  calcium carbonate (TUMS - DOSED IN MG ELEMENTAL CALCIUM) 500 MG chewable tablet [id number] Yes Chew 1-2 tablets by mouth as needed for indigestion or heartburn. [provider] Taking Active Spouse/Significant Other  Cholecalciferol (VITAMIN D3) 25 MCG (1000 UT) CAPS [id number] Yes Take 1 capsule (1,000 Units total) by mouth daily. Cathlyn Parsons, PA-C Taking Active   docusate sodium (COLACE) 100 MG capsule [id number] No Take 1 capsule (100 mg total) by mouth 2 (two) times daily.  Patient not taking: Reported on 02/01/2022   Cathlyn Parsons, PA-C Not Taking Active            Med Note Antony Contras, [place] B   Mon Feb 01, 2022 11:01 AM) Only uses as needed  donepezil (ARICEPT) 5 MG tablet [id number] Yes Take 1 tablet (5 mg total) by mouth at bedtime. Colon Branch, MD Taking Active   dorzolamide-timolol (COSOPT) 22.3-6.8 MG/ML ophthalmic solution [id number] Yes Place 1 drop into both eyes 2 (two) times daily. [provider] Taking Active Multiple Informants  escitalopram (LEXAPRO) 10 MG tablet [id number] Yes Take 2 tablets (20 mg total) by mouth daily. Colon Branch, MD Taking Active   fluticasone [facility]) 50 MCG/ACT nasal spray [id number] Yes Place 2 sprays into both nostrils daily as needed for allergies or rhinitis. Colon Branch, MD Taking Active   levothyroxine (SYNTHROID) 25 MCG tablet [id number] Yes Take 1 tablet (25 mcg total) by mouth in the morning and at bedtime. Colon Branch, MD Taking Active   Multiple Vitamin (MULTIVITAMIN) tablet [id number] Yes Take 1 tablet by mouth daily. [provider] Taking Active Multiple Informants  polyethylene glycol (MIRALAX / GLYCOLAX) 17 g packet [id number] Yes Take 17 g by mouth daily. Cathlyn Parsons, PA-C Taking Active   pravastatin (PRAVACHOL) 40 MG tablet [id number] Yes Take 1 tablet (40 mg total) by mouth at bedtime. Colon Branch, MD Taking Active   Travoprost, BAK Free, (TRAVATAN) 0.004 % SOLN ophthalmic solution [id number] Yes Place 1 drop into both eyes at bedtime. [provider] Taking Active Multiple Informants            Patient Active Problem List   Diagnosis Date Noted   Moderate dementia ([place]) 01/27/2022   Depression, major, single episode, complete remission ([place]) 11/25/2021   Hypothyroidism    Acute lower UTI    Transaminitis    History of pulmonary embolus (PE)    TBI (traumatic brain injury) ([place]) 02/11/2021   Essential hypertension    History of pulmonary embolism    Drug induced constipation    OSA (obstructive sleep apnea) 11/26/2020    Chronic fatigue 08/05/2020   Myoclonus 08/05/2020   Excessive daytime sleepiness 08/05/2020   Chronic anticoagulation 04/29/2020   Pulmonary nodule 12/26/2019   PCP NOTES >>>>>>>>>>>>>>> 10/09/2015   Allergic rhinitis 02/26/2014   Headache(784.0) 03/09/2012   Annual physical exam 06/22/2011   Osteoarthritis 12/15/2009   Insomnia 06/02/2009   History of pulmonary embolism 09/22/2007   DVT, HX OF 09/13/2007   Dyslipidemia 10/27/2006   PROSTATE CANCER, HX OF 10/27/2006   MVP (mitral valve prolapse) 10/05/2006     Medication Assistance:   Screened for medication assistance program for Eliquis but household income was above income limit.    Assessment / Plan: Dementia -  Continue donepezil '5mg'$  qhs. Plan to increase to '10mg'$  in future is tolerated.  Discussed social engagement and importance for cognition.  Depression -  Continue escitalopram '20mg'$  daily last week.  If continues to have depression  with higher escitalopram dose of if agitation continue options include:  Add memantine to donepezil. Change escitalopram to antipsychotic - would try aripiprazole (Patient has tried Seroquel for sleep and was stopped because patient did not need for sleep) Prazosin has been used for outburst of anger (though patient does not seem to meet this type of agitation pattern)  Medication Management -  Check EKG at follow up in November with Dr Larose Kells to assess for QT prolongation. Advised caregiver to notify office if any dizziness or change in HR.  Discussed changing to levothyroxine 40mg with next refill in November. Patient's wife want to wait to send in updated Rx until closer to time that it was due. Until November will change tot take levothyroxine 219m - 2 tablets = 5057monce each morning. Reminded patient's caregiver to separate vitamins, calcium at least 2 hours from levothyroxine dose.   Follow Up:  Telephone follow up appointment with care management team member scheduled for:  03/03/2022;  Will see PCP 03/31/2022   TamCherre RobinsharmD Clinical Pharmacist [place] [phone]

## 2022-02-01 NOTE — Patient Instructions (Signed)
Brandon Burke,  It was a pleasure speaking with you today.  Below is a summary of your health goals and summary of our recent visit.   Continue donepezil '5mg'$  at bedtime. Monitor for changes in appetite or nausea. Also notify office if you experience any dizziness or lightheadedness.  Continue escitalopram '20mg'$  daily last week.   Change how you take levothyroxine - take 76mg - 2 tablets (= 529m) once a day. In the future we will plan to change to the 5074mtablet so you will only have to take 1 tablet each morning.   Remember to separate vitamins from levothyroxine by at least 2 hours (continue to take multi-vitamin with lunch) .    As always if you have any questions or concerns especially regarding medications, please feel free to contact me either at the phone number below or with a MyChart message.   Keep up the good work!  TamCherre RobinsharmD Clinical Pharmacist [phone] line)  [phone] office number)   Dementia Caregiver Guide Dementia is a term used to describe a number of symptoms that affect memory and thinking. The most common symptoms include: Memory loss. Trouble with language and communication. Trouble concentrating. Poor judgment and problems with reasoning. Wandering from home or public places. Extreme anxiety or depression. Being suspicious or having angry outbursts and accusations. Child-like behavior and language. Dementia can be frightening and confusing. And taking care of someone with dementia can be challenging. This guide provides tips to help you when providing care for a person with dementia. How to help manage lifestyle changes Dementia usually gets worse slowly over time. In the early stages, people with dementia can stay independent and safe with some help. In later stages, they need help with daily tasks such as dressing, grooming, and using the bathroom. There are actions you can take  to help a person manage his or her life while living with this condition. Communicating When the person is talking or seems frustrated, make eye contact and hold the person's hand. Ask specific questions that need yes or no answers. Use simple words, short sentences, and a calm voice. Only give one direction at a time. When offering choices, limit the person to just one or two. Avoid correcting the person in a negative way. If the person is struggling to find the right words, gently try to help him or her. Preventing injury  Keep floors clear of clutter. Remove rugs, magazine racks, and floor lamps. Keep hallways well lit, especially at night. Put a handrail and nonslip mat in the bathtub or shower. Put childproof locks on cabinets that contain dangerous items, such as medicines, alcohol, guns, toxic cleaning items, sharp tools or utensils, matches, and lighters. For doors to the outside of the house, put the locks in places where the person cannot see or reach them easily. This will help ensure that the person does not wander out of the house and get lost. Be prepared for emergencies. Keep a list of emergency phone numbers and addresses in a convenient area. Remove car keys and lock garage doors so that the person does not try to get in the car and drive. Have the person wear a bracelet that tracks locations and identifies the person as having memory problems. This should be worn at all times for safety. Helping with daily life  Keep the person on track with his or her routine. Try to identify areas where the person may need  help. Be supportive, patient, calm, and encouraging. Gently remind the person that adjusting to changes takes time. Help with the tasks that the person has asked for help with. Keep the person involved in daily tasks and decisions as much as possible. Encourage conversation, but try not to get frustrated if the person struggles to find words or does not seem to  appreciate your help. How to recognize stress Look for signs of stress in yourself and in the person you are caring for. If you notice signs of stress, take steps to manage it. Symptoms of stress include: Feeling anxious, irritable, frustrated, or angry. Denying that the person has dementia or that his or her symptoms will not improve. Feeling depressed, hopeless, or unappreciated. Difficulty sleeping. Difficulty concentrating. Developing stress-related health problems. Feeling like you have too little time for your own life. Follow these instructions at home: Take care of your health Make sure that you and the person you are caring for: Get regular sleep. Exercise regularly. Eat regular, nutritious meals. Take over-the-counter and prescription medicines only as told by your health care providers. Drink enough fluid to keep your urine pale yellow. Attend all scheduled health care appointments.  General instructions Join a support group with others who are caregivers. Ask about respite care resources. Respite care can provide short-term care for the person so that you can have a regular break from the stress of caregiving. Consider any safety risks and take steps to avoid them. Organize medicines in a pill box for each day of the week. Create a plan to handle any legal or financial matters. Get legal or financial advice if needed. Keep a calendar in a central location to remind the person of appointments or other activities. Where to find support: Many individuals and organizations offer support. These include: Support groups for people with dementia. Support groups for caregivers. Counselors or therapists. Home health care services. Adult day care centers. Where to find more information Centers for Disease Control and Prevention: http://www.wolf.info/ Alzheimer's Association: [place].co.nz Family Caregiver Alliance: www.caregiver.[place]:  www.alzfdn.org Contact a health care provider if: The person's health is rapidly getting worse. You are no longer able to care for the person. Caring for the person is affecting your physical and emotional health. You are feeling depressed or anxious about caring for the person. Get help right away if: The person threatens himself or herself, you, or anyone else. You feel depressed or sad, or feel that you want to harm yourself. If you ever feel like your loved one may hurt himself or herself or others, or if he or she shares thoughts about taking his or her own life, get help right away. You can go to your nearest emergency department or: Call your local emergency services (911 in the [place] Call a suicide crisis helpline, such as the [place] at 2192333470 or 988 in the [place]. This is open 24 hours a day in the [place] Text the Crisis Text Line at 253-614-3105 (in the [place].). Summary Dementia is a term used to describe a number of symptoms that affect memory and thinking. Dementia usually gets worse slowly over time. Take steps to reduce the person's risk of injury and to plan for future care. Caregivers need support, relief from caregiving, and time for their own lives. This information is not intended to replace advice given to you by your health care provider. Make sure you discuss any questions you have with your health care provider. Document Revised:  11/12/2020 Document Reviewed: 09/03/2019 Elsevier Patient Education  [place].

## 2022-02-02 ENCOUNTER — Telehealth: Payer: Self-pay | Admitting: Pharmacist

## 2022-02-02 MED ORDER — LEVOTHYROXINE SODIUM 50 MCG PO TABS: 50.0000 ug | ORAL_TABLET | Freq: Every day | ORAL | 1 refills | Status: DC

## 2022-02-02 NOTE — Telephone Encounter (Signed)
Patient's wife called to request Rx for levothyroxine 34mg daily (instead of 230m twice a day) be sent to [facility].  Rx sent.

## 2022-02-04 DIAGNOSIS — L570 Actinic keratosis: Secondary | ICD-10-CM | POA: Diagnosis not present

## 2022-02-04 DIAGNOSIS — Z872 Personal history of diseases of the skin and subcutaneous tissue: Secondary | ICD-10-CM | POA: Diagnosis not present

## 2022-02-04 DIAGNOSIS — L218 Other seborrheic dermatitis: Secondary | ICD-10-CM | POA: Diagnosis not present

## 2022-02-04 DIAGNOSIS — L814 Other melanin hyperpigmentation: Secondary | ICD-10-CM | POA: Diagnosis not present

## 2022-02-04 DIAGNOSIS — D229 Melanocytic nevi, unspecified: Secondary | ICD-10-CM | POA: Diagnosis not present

## 2022-02-04 DIAGNOSIS — L579 Skin changes due to chronic exposure to nonionizing radiation, unspecified: Secondary | ICD-10-CM | POA: Diagnosis not present

## 2022-02-18 ENCOUNTER — Other Ambulatory Visit: Payer: Self-pay | Admitting: Internal Medicine

## 2022-02-22 ENCOUNTER — Telehealth: Payer: Self-pay | Admitting: Internal Medicine

## 2022-02-22 NOTE — Telephone Encounter (Signed)
Pt's wife stated she was advised to give pcp a call about donepezil (ARICEPT) 5 MG tablet. She stated he has been doing fine and wanted to speak directly with pcp about increasing rx.    [id number]: [phone] Fax: [phone]

## 2022-02-23 MED ORDER — DONEPEZIL HCL 10 MG PO TABS: 10.0000 mg | ORAL_TABLET | Freq: Every day | ORAL | 1 refills | Status: DC

## 2022-02-23 MED ORDER — DONEPEZIL HCL 10 MG PO TABS: 10.0000 mg | ORAL_TABLET | Freq: Every day | ORAL | 0 refills | Status: DC

## 2022-02-23 NOTE — Telephone Encounter (Signed)
Spoke with the patient's wife, he would like to increase  aricept from 5 mg to 10 mg.  That is okay, I see no contraindication, Rx sent

## 2022-02-24 DIAGNOSIS — H16212 Exposure keratoconjunctivitis, left eye: Secondary | ICD-10-CM | POA: Diagnosis not present

## 2022-02-24 DIAGNOSIS — H02423 Myogenic ptosis of bilateral eyelids: Secondary | ICD-10-CM | POA: Diagnosis not present

## 2022-02-24 DIAGNOSIS — H401111 Primary open-angle glaucoma, right eye, mild stage: Secondary | ICD-10-CM | POA: Diagnosis not present

## 2022-02-24 DIAGNOSIS — H5213 Myopia, bilateral: Secondary | ICD-10-CM | POA: Diagnosis not present

## 2022-02-24 DIAGNOSIS — H4032X3 Glaucoma secondary to eye trauma, left eye, severe stage: Secondary | ICD-10-CM | POA: Diagnosis not present

## 2022-02-26 ENCOUNTER — Ambulatory Visit (INDEPENDENT_AMBULATORY_CARE_PROVIDER_SITE_OTHER): Payer: PPO | Admitting: Internal Medicine

## 2022-02-26 ENCOUNTER — Encounter: Payer: Self-pay | Admitting: Internal Medicine

## 2022-02-26 VITALS — BP 122/66 | HR 61 | Temp 98.0°F | Resp 18 | Ht 72.0 in | Wt 169.5 lb

## 2022-02-26 DIAGNOSIS — F0390 Unspecified dementia without behavioral disturbance: Secondary | ICD-10-CM | POA: Diagnosis not present

## 2022-02-26 DIAGNOSIS — F325 Major depressive disorder, single episode, in full remission: Secondary | ICD-10-CM | POA: Diagnosis not present

## 2022-02-26 NOTE — Progress Notes (Unsigned)
Subjective:    Patient ID: Brandon Burke, male    DOB: 23-Jan-1932, 87 y.o.   MRN: [id number]  DOS:  02/26/2022 Type of visit - description: To complete paperwork  Request a letter stating that the patient is aware who his family members are, the value of his assets and how he likes them to be distributed.  He is here with his wife.  Reports that currently things at home are okay. They recently called about agitation but that has not been an issue.   Review of Systems See above   Past Medical History:  Diagnosis Date   Constipation    DVT (deep venous thrombosis) (HCC)    hx of 2001 (after prostate surgery)     Glaucoma    Hyperlipidemia    Hypertension    Melanoma ([place])    face, hand   Mitral valve prolapse    OSA (obstructive sleep apnea) 11/26/2020   PE (pulmonary embolism)    5-09:was referred to hematology, and they recommend to discontinue Coumadin 5/11   Prostate cancer ([place])     released from urology in 2010,needs yearly PSAs with PCP   Seasonal allergies     Past Surgical History:  Procedure Laterality Date   CATARACT EXTRACTION Bilateral    EYE SURGERY Bilateral    Cat Sx   PARS PLANA VITRECTOMY Left 09/06/2019   Procedure: PARS PLANA VITRECTOMY WITH 25 GAUGE WITH INTRAVITREAL ANTIBIOTICS;  Surgeon: [name], MD;  Location: [facility];  Service: Ophthalmology;  Laterality: Left;   PHOTOCOAGULATION WITH LASER Left 09/06/2019   Procedure: Photocoagulation With Laser;  Surgeon: [name], MD;  Location: [facility];  Service: Ophthalmology;  Laterality: Left;   PROSTATECTOMY  2000   RUPTURED GLOBE EXPLORATION AND REPAIR Left 08/16/2019   Procedure: OPEN GLOBE EXPLORATION EYE POSSIBLE ANTERIOR  CHAMBER [place] OUT;  Surgeon: Lonia Skinner, MD;  Location: [facility];  Service: Ophthalmology;  Laterality: Left;    Current Outpatient Medications  Medication Instructions   acetaminophen (TYLENOL) 650 mg, Oral, Every 6 hours PRN   apixaban (ELIQUIS) 2.5 mg, Oral,  2 times daily   ARTIFICIAL TEAR OP 1 drop, Both Eyes, As needed   atenolol (TENORMIN) 50 mg, Oral, Daily   calcium carbonate (TUMS - DOSED IN MG ELEMENTAL CALCIUM) 500 MG chewable tablet 1-2 tablets, Oral, As needed   docusate sodium (COLACE) 100 mg, Oral, 2 times daily   donepezil (ARICEPT) 10 mg, Oral, Daily at bedtime   dorzolamide-timolol (COSOPT) 22.3-6.8 MG/ML ophthalmic solution 1 drop, Both Eyes, 2 times daily   escitalopram (LEXAPRO) 20 mg, Oral, Daily   fluticasone (FLONASE) 50 MCG/ACT nasal spray 2 sprays, Each Nare, Daily PRN   levothyroxine (SYNTHROID) 50 mcg, Oral, Daily before breakfast   Multiple Vitamin (MULTIVITAMIN) tablet 1 tablet, Oral, Daily, Takes at lunch   polyethylene glycol (MIRALAX / GLYCOLAX) 17 g, Oral, Daily   pravastatin (PRAVACHOL) 40 mg, Oral, Daily at bedtime   Travoprost, BAK Free, (TRAVATAN) 0.004 % SOLN ophthalmic solution 1 drop, Both Eyes, Daily at bedtime   Vitamin D3 1,000 Units, Oral, Daily       Objective:   Physical Exam BP 122/66   Pulse 61   Temp 98 F (36.7 C) (Oral)   Resp 18   Ht 6' (1.829 m)   Wt 169 lb 8 oz (76.9 kg)   SpO2 97%   BMI 22.99 kg/m  General:   Well developed, NAD, BMI noted. HEENT:  Normocephalic . Face symmetric,  atraumatic   Lower extremities: no pretibial edema bilaterally  Skin: Not pale. Not jaundice Neurologic:  alert & oriented to self, place, not on time.  He recognizes and knows who is his wife. Speech normal, gait assisted by walker Psych--    Behavior appropriate. No anxious or depressed appearing.      Assessment     Assessment Hyperlipidemia Prostate cancer, released from urology 2010, Rx yearly PSAs by PCP HEM: DVT 2001 after surgery; PE 2009, eval by hematology, they recommend DC Coumadin 2011.  Spontaneous right leg DVT 11/2019, anticoag x life (dose decreased to Eliquis 2.5 twice daily on 04/2020) H/o MVP-- on BB glaucoma Sees dermatology q 6 months  Hypothyroidism, diagnosed  08-2020. OSA Dx 09/01/2020.unable to use a CPAP Facial FX 08-2019, lost L eye vision 01-2021: Fall, TBI, facial lacerations, multiple rib fractures, Poor vision  PLAN Dementia: The patient request a letter to be written in regards to his ability to be aware who his family members are, the value of his assets etc. Although he carries a diagnosis of dementia, I think he is quite capable of make determinations such as his trust, etc. His wife is with him, they have come together to this office for more than a decade. Letter prepared for them. Since the last visit they called about agitation, but seems to be better at this point. Depression: Both the patient and the wife report that he is doing okay

## 2022-02-26 NOTE — Patient Instructions (Signed)
Vaccines I recommend : Covid booster RSV vaccine   

## 2022-02-27 NOTE — Assessment & Plan Note (Signed)
Dementia: The patient request a letter to be written in regards to his ability to be aware who his family members are, the value of his assets etc. Although he carries a diagnosis of dementia, I think he is quite capable of make determinations such as those described above. His wife is with him, they have come together to this office for more than a decade. Letter prepared for them. Since the last visit they called about agitation, but seems to be better at this point. Depression: Both the patient and the wife report that he is doing okay

## 2022-03-03 ENCOUNTER — Telehealth: Payer: PPO

## 2022-03-04 DIAGNOSIS — L821 Other seborrheic keratosis: Secondary | ICD-10-CM | POA: Diagnosis not present

## 2022-03-04 DIAGNOSIS — L570 Actinic keratosis: Secondary | ICD-10-CM | POA: Diagnosis not present

## 2022-03-04 DIAGNOSIS — L820 Inflamed seborrheic keratosis: Secondary | ICD-10-CM | POA: Diagnosis not present

## 2022-03-04 DIAGNOSIS — L579 Skin changes due to chronic exposure to nonionizing radiation, unspecified: Secondary | ICD-10-CM | POA: Diagnosis not present

## 2022-03-12 DIAGNOSIS — L602 Onychogryphosis: Secondary | ICD-10-CM | POA: Diagnosis not present

## 2022-03-12 DIAGNOSIS — L84 Corns and callosities: Secondary | ICD-10-CM | POA: Diagnosis not present

## 2022-03-12 DIAGNOSIS — M79671 Pain in right foot: Secondary | ICD-10-CM | POA: Diagnosis not present

## 2022-03-12 DIAGNOSIS — M79672 Pain in left foot: Secondary | ICD-10-CM | POA: Diagnosis not present

## 2022-03-18 ENCOUNTER — Other Ambulatory Visit: Payer: Self-pay | Admitting: Internal Medicine

## 2022-03-31 ENCOUNTER — Ambulatory Visit (INDEPENDENT_AMBULATORY_CARE_PROVIDER_SITE_OTHER): Payer: PPO | Admitting: Internal Medicine

## 2022-03-31 ENCOUNTER — Encounter: Payer: Self-pay | Admitting: Internal Medicine

## 2022-03-31 VITALS — BP 132/78 | HR 57 | Temp 97.5°F | Resp 16 | Ht 72.0 in | Wt 166.5 lb

## 2022-03-31 DIAGNOSIS — E785 Hyperlipidemia, unspecified: Secondary | ICD-10-CM

## 2022-03-31 DIAGNOSIS — E039 Hypothyroidism, unspecified: Secondary | ICD-10-CM | POA: Diagnosis not present

## 2022-03-31 DIAGNOSIS — F03B0 Unspecified dementia, moderate, without behavioral disturbance, psychotic disturbance, mood disturbance, and anxiety: Secondary | ICD-10-CM | POA: Diagnosis not present

## 2022-03-31 LAB — TSH: TSH: 2.1 u[IU]/mL (ref 0.35–5.50)

## 2022-03-31 LAB — LDL CHOLESTEROL, DIRECT: Direct LDL: 78 mg/dL

## 2022-03-31 LAB — LIPID PANEL
Cholesterol: 142 mg/dL (ref 0–200)
HDL: 35.3 mg/dL — ABNORMAL LOW (ref >39.00)
NonHDL: 106.93
Total CHOL/HDL Ratio: 4
Triglycerides: 252 mg/dL — ABNORMAL HIGH (ref 0.0–149.0)
VLDL: 50.4 mg/dL — ABNORMAL HIGH (ref 0.0–40.0)

## 2022-03-31 NOTE — Progress Notes (Signed)
Subjective:    Patient ID: Brandon Burke, male    DOB: 05/11/1931, 88 y.o.   MRN: [id number]  DOS:  03/31/2022 Type of visit - description: f/u  Follow-up from previous visits.  Here with his wife. Today he reports is feeling very well. Able to walk with only a cane. Emotionally doing well. Memory is stable per wife.   Review of Systems See above   Past Medical History:  Diagnosis Date   Constipation    DVT (deep venous thrombosis) (HCC)    hx of 2001 (after prostate surgery)     Glaucoma    Hyperlipidemia    Hypertension    Melanoma ([place])    face, hand   Mitral valve prolapse    OSA (obstructive sleep apnea) 11/26/2020   PE (pulmonary embolism)    5-09:was referred to hematology, and they recommend to discontinue Coumadin 5/11   Prostate cancer ([place])     released from urology in 2010,needs yearly PSAs with PCP   Seasonal allergies     Past Surgical History:  Procedure Laterality Date   CATARACT EXTRACTION Bilateral    EYE SURGERY Bilateral    Cat Sx   PARS PLANA VITRECTOMY Left 09/06/2019   Procedure: PARS PLANA VITRECTOMY WITH 25 GAUGE WITH INTRAVITREAL ANTIBIOTICS;  Surgeon: [name], MD;  Location: [place];  Service: Ophthalmology;  Laterality: Left;   PHOTOCOAGULATION WITH LASER Left 09/06/2019   Procedure: Photocoagulation With Laser;  Surgeon: [name], MD;  Location: [place];  Service: Ophthalmology;  Laterality: Left;   PROSTATECTOMY  2000   RUPTURED GLOBE EXPLORATION AND REPAIR Left 08/16/2019   Procedure: OPEN GLOBE EXPLORATION EYE POSSIBLE ANTERIOR  CHAMBER [place] OUT;  Surgeon: Lonia Skinner, MD;  Location: [place];  Service: Ophthalmology;  Laterality: Left;    Current Outpatient Medications  Medication Instructions   acetaminophen (TYLENOL) 650 mg, Oral, Every 6 hours PRN   apixaban (ELIQUIS) 2.5 mg, Oral, 2 times daily   ARTIFICIAL TEAR OP 1 drop, Both Eyes, As needed   atenolol (TENORMIN) 50 mg, Oral, Daily   calcium carbonate (TUMS  - DOSED IN MG ELEMENTAL CALCIUM) 500 MG chewable tablet 1-2 tablets, Oral, As needed   docusate sodium (COLACE) 100 mg, Oral, 2 times daily   donepezil (ARICEPT) 10 mg, Oral, Daily at bedtime   dorzolamide-timolol (COSOPT) 22.3-6.8 MG/ML ophthalmic solution 1 drop, Both Eyes, 2 times daily   escitalopram (LEXAPRO) 20 mg, Oral, Daily   fluorouracil (EFUDEX) 5 % cream Topical, 2 times daily, Use as directed on scalp   fluticasone (FLONASE) 50 MCG/ACT nasal spray 2 sprays, Each Nare, Daily PRN   levothyroxine (SYNTHROID) 50 mcg, Oral, Daily before breakfast   Multiple Vitamin (MULTIVITAMIN) tablet 1 tablet, Oral, Daily, Takes at lunch   polyethylene glycol (MIRALAX / GLYCOLAX) 17 g, Oral, Daily   pravastatin (PRAVACHOL) 40 mg, Oral, Daily at bedtime   Travoprost, BAK Free, (TRAVATAN) 0.004 % SOLN ophthalmic solution 1 drop, Both Eyes, Daily at bedtime   Vitamin D3 1,000 Units, Oral, Daily       Objective:   Physical Exam BP 132/78   Pulse (!) 57   Temp (!) 97.5 F (36.4 C) (Oral)   Resp 16   Ht 6' (1.829 m)   Wt 166 lb 8 oz (75.5 kg)   SpO2 97%   BMI 22.58 kg/m  General:   Well developed, NAD, BMI noted. HEENT:  Normocephalic . Face symmetric, atraumatic Lungs:  CTA B Normal respiratory effort,  no intercostal retractions, no accessory muscle use. Heart: RRR,  no murmur.  Lower extremities: no pretibial edema bilaterally  Skin: Not pale. Not jaundice Neurologic:  MMSE not done today but he looks well, alert, oriented to self, place, recognizes me without any problems. Speech normal, gait much improved, uses a cane. Psych--  He seems to be in great spirits, more interactive, smiling.    Assessment    Assessment Hyperlipidemia Prostate cancer, released from urology 2010, Rx yearly PSAs by PCP HEM: DVT 2001 after surgery; PE 2009, eval by hematology, they recommend DC Coumadin 2011.  Spontaneous right leg DVT 11/2019, anticoag x life (dose decreased to Eliquis 2.5 twice daily  on 04/2020) H/o MVP-- on BB glaucoma Sees dermatology q 6 months  Hypothyroidism, diagnosed 08-2020. OSA Dx 09/01/2020.unable to use a CPAP Facial FX 08-2019, lost L eye vision 01-2021: Fall, TBI, facial lacerations, multiple rib fractures, Poor vision  PLAN Dementia: Seems to be doing great, not losing ground per patient's wife.  Continue Aricept. Depression: Reportedly much improved, on exam he is indeed better.  Continue Lexapro. Hypothyroidism: On Synthroid, check TSH High cholesterol: On Pravachol, checking labs Pulmonary nodules: Stable per CT chest 01-2022 Preventive care: Had RSV and flu shot, recommend a COVID-vaccine when ready. Fall prevention discussed, he is using less the walker and more the cane.  Recommend to continue precautions to prevent falls. RTC 3 months

## 2022-03-31 NOTE — Assessment & Plan Note (Signed)
Dementia: Seems to be doing great, not losing ground per patient's wife.  Continue Aricept. Depression: Reportedly much improved, on exam he is indeed better.  Continue Lexapro. Hypothyroidism: On Synthroid, check TSH High cholesterol: On Pravachol, checking labs Pulmonary nodules: Stable per CT chest 01-2022 Preventive care: Had RSV and flu shot, recommend a COVID-vaccine when ready. Fall prevention discussed, he is using less the walker and more the cane.  Recommend to continue precautions to prevent falls. RTC 3 months

## 2022-03-31 NOTE — Patient Instructions (Addendum)
Vaccines I recommend:  Covid booster    GO TO THE LAB : Get the blood work     [place], [place] back for   a checkup in 3 months

## 2022-04-13 ENCOUNTER — Other Ambulatory Visit: Payer: Self-pay

## 2022-04-13 MED ORDER — APIXABAN 2.5 MG PO TABS: 2.5000 mg | ORAL_TABLET | Freq: Two times a day (BID) | ORAL | 1 refills | Status: DC

## 2022-04-29 ENCOUNTER — Other Ambulatory Visit: Payer: Self-pay

## 2022-04-29 MED ORDER — ATENOLOL 50 MG PO TABS: 50.0000 mg | ORAL_TABLET | Freq: Every day | ORAL | 1 refills | Status: DC

## 2022-05-06 DIAGNOSIS — L814 Other melanin hyperpigmentation: Secondary | ICD-10-CM | POA: Diagnosis not present

## 2022-05-06 DIAGNOSIS — L570 Actinic keratosis: Secondary | ICD-10-CM | POA: Diagnosis not present

## 2022-05-06 DIAGNOSIS — Z85068 Personal history of other malignant neoplasm of small intestine: Secondary | ICD-10-CM | POA: Diagnosis not present

## 2022-05-06 DIAGNOSIS — L821 Other seborrheic keratosis: Secondary | ICD-10-CM | POA: Diagnosis not present

## 2022-05-18 ENCOUNTER — Other Ambulatory Visit: Payer: Self-pay

## 2022-05-18 MED ORDER — PRAVASTATIN SODIUM 40 MG PO TABS: 40.0000 mg | ORAL_TABLET | Freq: Every day | ORAL | 1 refills | Status: DC

## 2022-05-18 MED ORDER — ESCITALOPRAM OXALATE 10 MG PO TABS
20.0000 mg | ORAL_TABLET | Freq: Every day | ORAL | 1 refills | Status: DC
  Filled 2022-09-20: qty 180, 90d supply, fill #0

## 2022-06-03 DIAGNOSIS — L814 Other melanin hyperpigmentation: Secondary | ICD-10-CM | POA: Diagnosis not present

## 2022-06-03 DIAGNOSIS — L570 Actinic keratosis: Secondary | ICD-10-CM | POA: Diagnosis not present

## 2022-06-03 DIAGNOSIS — D492 Neoplasm of unspecified behavior of bone, soft tissue, and skin: Secondary | ICD-10-CM | POA: Diagnosis not present

## 2022-06-03 DIAGNOSIS — L579 Skin changes due to chronic exposure to nonionizing radiation, unspecified: Secondary | ICD-10-CM | POA: Diagnosis not present

## 2022-06-03 DIAGNOSIS — D225 Melanocytic nevi of trunk: Secondary | ICD-10-CM | POA: Diagnosis not present

## 2022-06-03 DIAGNOSIS — D229 Melanocytic nevi, unspecified: Secondary | ICD-10-CM | POA: Diagnosis not present

## 2022-06-17 ENCOUNTER — Other Ambulatory Visit: Payer: Self-pay | Admitting: Internal Medicine

## 2022-06-25 DIAGNOSIS — M79671 Pain in right foot: Secondary | ICD-10-CM | POA: Diagnosis not present

## 2022-06-25 DIAGNOSIS — M79672 Pain in left foot: Secondary | ICD-10-CM | POA: Diagnosis not present

## 2022-06-25 DIAGNOSIS — L602 Onychogryphosis: Secondary | ICD-10-CM | POA: Diagnosis not present

## 2022-06-25 DIAGNOSIS — L84 Corns and callosities: Secondary | ICD-10-CM | POA: Diagnosis not present

## 2022-07-01 ENCOUNTER — Encounter: Payer: Self-pay | Admitting: Internal Medicine

## 2022-07-01 ENCOUNTER — Ambulatory Visit (INDEPENDENT_AMBULATORY_CARE_PROVIDER_SITE_OTHER): Payer: PPO | Admitting: Internal Medicine

## 2022-07-01 VITALS — BP 122/70 | HR 61 | Temp 98.0°F | Resp 16 | Ht 72.0 in | Wt 174.5 lb

## 2022-07-01 DIAGNOSIS — J3089 Other allergic rhinitis: Secondary | ICD-10-CM

## 2022-07-01 DIAGNOSIS — Z7901 Long term (current) use of anticoagulants: Secondary | ICD-10-CM

## 2022-07-01 DIAGNOSIS — F03B11 Unspecified dementia, moderate, with agitation: Secondary | ICD-10-CM

## 2022-07-01 DIAGNOSIS — E785 Hyperlipidemia, unspecified: Secondary | ICD-10-CM | POA: Diagnosis not present

## 2022-07-01 LAB — CBC WITH DIFFERENTIAL/PLATELET
Basophils Absolute: 0 10*3/uL (ref 0.0–0.1)
Basophils Relative: 0.7 % (ref 0.0–3.0)
Eosinophils Absolute: 0.2 10*3/uL (ref 0.0–0.7)
Eosinophils Relative: 4 % (ref 0.0–5.0)
HCT: 41.5 % (ref 39.0–52.0)
Hemoglobin: 14.2 g/dL (ref 13.0–17.0)
Lymphocytes Relative: 14.7 % (ref 12.0–46.0)
Lymphs Abs: 0.8 10*3/uL (ref 0.7–4.0)
MCHC: 34.2 g/dL (ref 30.0–36.0)
MCV: 98.2 fl (ref 78.0–100.0)
Monocytes Absolute: 0.6 10*3/uL (ref 0.1–1.0)
Monocytes Relative: 10.2 % (ref 3.0–12.0)
Neutro Abs: 4 10*3/uL (ref 1.4–7.7)
Neutrophils Relative %: 70.4 % (ref 43.0–77.0)
Platelets: 147 10*3/uL — ABNORMAL LOW (ref 150.0–400.0)
RBC: 4.22 Mil/uL (ref 4.22–5.81)
RDW: 12.8 % (ref 11.5–15.5)
WBC: 5.7 10*3/uL (ref 4.0–10.5)

## 2022-07-01 LAB — BASIC METABOLIC PANEL
BUN: 17 mg/dL (ref 6–23)
CO2: 27 mEq/L (ref 19–32)
Calcium: 9.3 mg/dL (ref 8.4–10.5)
Chloride: 105 mEq/L (ref 96–112)
Creatinine, Ser: 1.2 mg/dL (ref 0.40–1.50)
GFR: 53.98 mL/min — ABNORMAL LOW (ref >60.00)
Glucose, Bld: 89 mg/dL (ref 70–99)
Potassium: 4.4 mEq/L (ref 3.5–5.1)
Sodium: 141 mEq/L (ref 135–145)

## 2022-07-01 MED ORDER — ATENOLOL 50 MG PO TABS: 25.0000 mg | ORAL_TABLET | Freq: Every day | ORAL | Status: DC

## 2022-07-01 NOTE — Progress Notes (Signed)
Subjective:    Patient ID: Brandon Burke, male    DOB: 1932/04/14, 89 y.o.   MRN: [id number]  DOS:  07/01/2022 Type of visit - description: Follow-up  Here with his wife. No new concerns. Continues to feel fatigue, denies chest pain, palpitations, DOE.  He simply "ran out of energy" during ADLs   Review of Systems See above   Past Medical History:  Diagnosis Date   Constipation    DVT (deep venous thrombosis) (HCC)    hx of 2001 (after prostate surgery)     Glaucoma    Hyperlipidemia    Hypertension    Melanoma ([place])    face, hand   Mitral valve prolapse    OSA (obstructive sleep apnea) 11/26/2020   PE (pulmonary embolism)    5-09:was referred to hematology, and they recommend to discontinue Coumadin 5/11   Prostate cancer ([place])     released from urology in 2010,needs yearly PSAs with PCP   Seasonal allergies     Past Surgical History:  Procedure Laterality Date   CATARACT EXTRACTION Bilateral    EYE SURGERY Bilateral    Cat Sx   PARS PLANA VITRECTOMY Left 09/06/2019   Procedure: PARS PLANA VITRECTOMY WITH 25 GAUGE WITH INTRAVITREAL ANTIBIOTICS;  Surgeon: [name], MD;  Location: [place];  Service: Ophthalmology;  Laterality: Left;   PHOTOCOAGULATION WITH LASER Left 09/06/2019   Procedure: Photocoagulation With Laser;  Surgeon: [name], MD;  Location: [place];  Service: Ophthalmology;  Laterality: Left;   PROSTATECTOMY  2000   RUPTURED GLOBE EXPLORATION AND REPAIR Left 08/16/2019   Procedure: OPEN GLOBE EXPLORATION EYE POSSIBLE ANTERIOR  CHAMBER [place] OUT;  Surgeon: Lonia Skinner, MD;  Location: [place];  Service: Ophthalmology;  Laterality: Left;    Current Outpatient Medications  Medication Instructions   acetaminophen (TYLENOL) 650 mg, Oral, Every 6 hours PRN   apixaban (ELIQUIS) 2.5 mg, Oral, 2 times daily   ARTIFICIAL TEAR OP 1 drop, Both Eyes, As needed   atenolol (TENORMIN) 25 mg, Oral, Daily   azelastine (ASTELIN) 0.1 % nasal spray 2 sprays,  Each Nare, 2 times daily, Use in each nostril as directed   calcium carbonate (TUMS - DOSED IN MG ELEMENTAL CALCIUM) 500 MG chewable tablet 1-2 tablets, Oral, As needed   docusate sodium (COLACE) 100 mg, Oral, 2 times daily   donepezil (ARICEPT) 10 mg, Oral, Daily at bedtime   dorzolamide-timolol (COSOPT) 22.3-6.8 MG/ML ophthalmic solution 1 drop, Both Eyes, 2 times daily   escitalopram (LEXAPRO) 20 mg, Oral, Daily   fluorouracil (EFUDEX) 5 % cream Topical, 2 times daily, Use as directed on scalp   fluticasone (FLONASE) 50 MCG/ACT nasal spray 2 sprays, Each Nare, Daily PRN   levothyroxine (SYNTHROID) 50 mcg, Oral, Daily before breakfast   MELATONIN PO 4 mg, Oral, Daily at bedtime   Multiple Vitamin (MULTIVITAMIN) tablet 1 tablet, Oral, Daily, Takes at lunch   polyethylene glycol (MIRALAX / GLYCOLAX) 17 g, Oral, Daily   pravastatin (PRAVACHOL) 40 mg, Oral, Daily at bedtime   Travoprost, BAK Free, (TRAVATAN) 0.004 % SOLN ophthalmic solution 1 drop, Both Eyes, Daily at bedtime   Vitamin D3 1,000 Units, Oral, Daily       Objective:   Physical Exam BP 122/70   Pulse 61   Temp 98 F (36.7 C) (Oral)   Resp 16   Ht 6' (1.829 m)   Wt 174 lb 8 oz (79.2 kg)   SpO2 97%   BMI 23.67 kg/m  General:   Well developed, NAD, BMI noted. HEENT:  Normocephalic . Face symmetric, atraumatic Lungs:  CTA B Normal respiratory effort, no intercostal retractions, no accessory muscle use. Heart: RRR,  no murmur.  Lower extremities: no pretibial edema bilaterally  Skin: Not pale. Not jaundice Neurologic:  alert & oriented X3.  Speech normal, gait: Assisted by cane  psych--  Cognition and judgment appear intact.  Cooperative with normal attention span and concentration.  Behavior appropriate. No anxious or depressed appearing.      Assessment    Assessment Hyperlipidemia Prostate cancer, released from urology 2010, Rx yearly PSAs by PCP MVP,  h/o palpitations: on chronic atenolol   HEM: DVT  2001 after surgery; PE 2009, eval by hematology, they recommend DC Coumadin 2011.  Spontaneous right leg DVT 11/2019, anticoag x life (dose decreased to Eliquis 2.5 twice daily on 04/2020) H/o MVP-- on BB glaucoma Sees dermatology q 6 months  Hypothyroidism, diagnosed 08-2020. OSA Dx 09/01/2020.unable to use a CPAP Facial FX 08-2019, lost L eye vision 01-2021: Fall, TBI, facial lacerations, multiple rib fractures, Poor vision  PLAN Hyperlipidemia: Well-controlled Hypothyroidism: Last TSH satisfactory Chronic anticoagulation: Due to history of DVT and PE.  Checking CBC and BMP Fatigue: Chronic issue, suspect multifactorial, vitamins has been checked before >> wnl.  See next Atenolol: On atenolol for many years, at some point he had palpitations and a question of MVP.  This may be a contributing factor for fatigue, will decrease atenolol to 25 mg. Dementia: Not losing ground.  Stable. Allergic rhinitis: On Flonase, not controlled, add Astepro Social: Lives at an assisted living community, his brother visited them frequently and do the driving. RTC 4 months

## 2022-07-01 NOTE — Assessment & Plan Note (Addendum)
Hyperlipidemia: Well-controlled Hypothyroidism: Last TSH satisfactory Chronic anticoagulation: Due to history of DVT and PE.  Checking CBC and BMP Fatigue: Chronic issue, suspect multifactorial, vitamins has been checked before >> wnl.  See next Atenolol: On atenolol for many years, at some point he had palpitations and a question of MVP.  This may be a contributing factor for fatigue, will decrease atenolol to 25 mg. Dementia: Not losing ground.  Stable. Allergic rhinitis: On Flonase, not controlled, add Astepro Social: Lives at an assisted living community, his brother visited them frequently and do the driving. RTC 4 months

## 2022-07-01 NOTE — Patient Instructions (Addendum)
Vaccines I recommend:  Covid booster  Decrease atenolol 50 mg: Half tablet daily  Check the  blood pressure regularly BP GOAL is between 110/65 and  135/85. If it is consistently higher or lower, let me know      GO TO THE LAB : Get the blood work     [place], [place] back for   a checkup in 4 to 5 months

## 2022-07-20 DIAGNOSIS — L578 Other skin changes due to chronic exposure to nonionizing radiation: Secondary | ICD-10-CM | POA: Diagnosis not present

## 2022-07-20 DIAGNOSIS — D229 Melanocytic nevi, unspecified: Secondary | ICD-10-CM | POA: Diagnosis not present

## 2022-07-20 DIAGNOSIS — L570 Actinic keratosis: Secondary | ICD-10-CM | POA: Diagnosis not present

## 2022-07-22 DIAGNOSIS — H4032X3 Glaucoma secondary to eye trauma, left eye, severe stage: Secondary | ICD-10-CM | POA: Diagnosis not present

## 2022-07-22 DIAGNOSIS — H401111 Primary open-angle glaucoma, right eye, mild stage: Secondary | ICD-10-CM | POA: Diagnosis not present

## 2022-08-02 ENCOUNTER — Other Ambulatory Visit: Payer: Self-pay | Admitting: Internal Medicine

## 2022-08-16 ENCOUNTER — Telehealth: Payer: Self-pay | Admitting: Internal Medicine

## 2022-08-16 MED ORDER — ATENOLOL 50 MG PO TABS: 25.0000 mg | ORAL_TABLET | Freq: Every day | ORAL | 1 refills | Status: DC

## 2022-08-16 NOTE — Telephone Encounter (Signed)
Rx sent 

## 2022-08-16 NOTE — Telephone Encounter (Signed)
Pt's wife called to see if they could get a new prescription for Atenolol 25 mg sent to [facility]. Please advise pt.

## 2022-08-16 NOTE — Addendum Note (Signed)
Addended byConrad Millfield D on: 08/16/2022 03:08 PM   Modules accepted: Orders

## 2022-08-16 NOTE — Telephone Encounter (Signed)
Please send in 90 day supply.

## 2022-08-23 ENCOUNTER — Other Ambulatory Visit (HOSPITAL_COMMUNITY): Payer: Self-pay

## 2022-08-23 ENCOUNTER — Telehealth: Payer: Self-pay | Admitting: Internal Medicine

## 2022-08-23 NOTE — Telephone Encounter (Signed)
Please advise 

## 2022-08-23 NOTE — Telephone Encounter (Signed)
Roddie Mc (spouse DPR Ok) called stating that pt's atenolol Rx needs to be rewritten for 1  tablet rather than 0.5  tablet and the Rx needed to be sent to the following pharmacy for mail order:   [facility] at [facility] [address] [place], [place], [place] [address] P: [phone]

## 2022-08-24 ENCOUNTER — Other Ambulatory Visit: Payer: Self-pay

## 2022-08-24 ENCOUNTER — Other Ambulatory Visit (HOSPITAL_COMMUNITY): Payer: Self-pay

## 2022-08-24 MED ORDER — ATENOLOL 25 MG PO TABS
25.0000 mg | ORAL_TABLET | Freq: Every day | ORAL | 1 refills | Status: DC
  Filled 2022-08-24: qty 90, 90d supply, fill #0
  Filled 2023-02-28: qty 90, 90d supply, fill #1

## 2022-08-24 NOTE — Addendum Note (Signed)
Addended by: Willow Ora E on: 08/24/2022 10:02 AM   Modules accepted: Orders

## 2022-08-24 NOTE — Telephone Encounter (Signed)
Rx sent 

## 2022-08-24 NOTE — Telephone Encounter (Signed)
Ok to send atenolol 25 mg 1 tablet daily.

## 2022-08-24 NOTE — Addendum Note (Signed)
Addended byConrad Piedmont D on: 08/24/2022 10:04 AM   Modules accepted: Orders

## 2022-08-26 DIAGNOSIS — L812 Freckles: Secondary | ICD-10-CM | POA: Diagnosis not present

## 2022-08-26 DIAGNOSIS — L570 Actinic keratosis: Secondary | ICD-10-CM | POA: Diagnosis not present

## 2022-08-26 DIAGNOSIS — L821 Other seborrheic keratosis: Secondary | ICD-10-CM | POA: Diagnosis not present

## 2022-08-26 DIAGNOSIS — Z85828 Personal history of other malignant neoplasm of skin: Secondary | ICD-10-CM | POA: Diagnosis not present

## 2022-09-06 ENCOUNTER — Other Ambulatory Visit (HOSPITAL_COMMUNITY): Payer: Self-pay

## 2022-09-06 ENCOUNTER — Other Ambulatory Visit: Payer: Self-pay | Admitting: Internal Medicine

## 2022-09-06 ENCOUNTER — Other Ambulatory Visit: Payer: Self-pay

## 2022-09-06 MED ORDER — DONEPEZIL HCL 10 MG PO TABS
10.0000 mg | ORAL_TABLET | Freq: Every day | ORAL | 1 refills | Status: DC
  Filled 2022-09-06: qty 90, 90d supply, fill #0

## 2022-09-06 MED ORDER — PRAVASTATIN SODIUM 40 MG PO TABS
40.0000 mg | ORAL_TABLET | Freq: Every day | ORAL | 1 refills | Status: DC
  Filled 2022-09-06: qty 90, 90d supply, fill #0
  Filled 2022-11-29 (×2): qty 90, 90d supply, fill #1

## 2022-09-20 ENCOUNTER — Other Ambulatory Visit (HOSPITAL_COMMUNITY): Payer: Self-pay

## 2022-09-21 ENCOUNTER — Other Ambulatory Visit (HOSPITAL_COMMUNITY): Payer: Self-pay

## 2022-09-22 ENCOUNTER — Other Ambulatory Visit (HOSPITAL_COMMUNITY): Payer: Self-pay

## 2022-09-23 ENCOUNTER — Other Ambulatory Visit (HOSPITAL_COMMUNITY): Payer: Self-pay

## 2022-09-23 MED ORDER — DORZOLAMIDE HCL-TIMOLOL MAL 2-0.5 % OP SOLN
1.0000 [drp] | Freq: Two times a day (BID) | OPHTHALMIC | 0 refills | Status: DC
  Filled 2022-09-23: qty 20, 100d supply, fill #0
  Filled 2022-09-23: qty 10, 50d supply, fill #0

## 2022-10-14 ENCOUNTER — Other Ambulatory Visit: Payer: Self-pay

## 2022-10-14 MED ORDER — APIXABAN 2.5 MG PO TABS: 2.5000 mg | ORAL_TABLET | Freq: Two times a day (BID) | ORAL | 1 refills | Status: DC

## 2022-10-15 DIAGNOSIS — L602 Onychogryphosis: Secondary | ICD-10-CM | POA: Diagnosis not present

## 2022-10-15 DIAGNOSIS — M79672 Pain in left foot: Secondary | ICD-10-CM | POA: Diagnosis not present

## 2022-10-15 DIAGNOSIS — M79671 Pain in right foot: Secondary | ICD-10-CM | POA: Diagnosis not present

## 2022-10-15 DIAGNOSIS — L84 Corns and callosities: Secondary | ICD-10-CM | POA: Diagnosis not present

## 2022-10-28 ENCOUNTER — Other Ambulatory Visit (HOSPITAL_COMMUNITY): Payer: Self-pay

## 2022-10-28 DIAGNOSIS — H4032X3 Glaucoma secondary to eye trauma, left eye, severe stage: Secondary | ICD-10-CM | POA: Diagnosis not present

## 2022-10-28 DIAGNOSIS — H401111 Primary open-angle glaucoma, right eye, mild stage: Secondary | ICD-10-CM | POA: Diagnosis not present

## 2022-10-28 MED ORDER — LATANOPROST 0.005 % OP SOLN
1.0000 [drp] | Freq: Every evening | OPHTHALMIC | 1 refills | Status: DC
  Filled 2022-10-28: qty 7.5, 75d supply, fill #0
  Filled 2023-02-21: qty 7.5, 75d supply, fill #1

## 2022-10-28 MED ORDER — DORZOLAMIDE HCL-TIMOLOL MAL 2-0.5 % OP SOLN
1.0000 [drp] | Freq: Two times a day (BID) | OPHTHALMIC | 1 refills | Status: DC
  Filled 2022-10-28: qty 20, 200d supply, fill #0
  Filled 2022-12-23: qty 20, 100d supply, fill #0
  Filled 2023-07-21: qty 10, 90d supply, fill #1

## 2022-10-29 ENCOUNTER — Other Ambulatory Visit: Payer: Self-pay

## 2022-10-29 ENCOUNTER — Other Ambulatory Visit (HOSPITAL_COMMUNITY): Payer: Self-pay

## 2022-10-30 ENCOUNTER — Other Ambulatory Visit (HOSPITAL_COMMUNITY): Payer: Self-pay

## 2022-11-01 ENCOUNTER — Ambulatory Visit: Payer: PPO | Admitting: Internal Medicine

## 2022-11-02 ENCOUNTER — Other Ambulatory Visit: Payer: Self-pay

## 2022-11-03 ENCOUNTER — Other Ambulatory Visit: Payer: Self-pay

## 2022-11-05 ENCOUNTER — Ambulatory Visit (INDEPENDENT_AMBULATORY_CARE_PROVIDER_SITE_OTHER): Payer: PPO | Admitting: Internal Medicine

## 2022-11-05 ENCOUNTER — Encounter: Payer: Self-pay | Admitting: Internal Medicine

## 2022-11-05 ENCOUNTER — Other Ambulatory Visit (HOSPITAL_BASED_OUTPATIENT_CLINIC_OR_DEPARTMENT_OTHER): Payer: Self-pay

## 2022-11-05 VITALS — BP 122/66 | HR 54 | Temp 98.0°F | Resp 18 | Ht 72.0 in | Wt 167.5 lb

## 2022-11-05 DIAGNOSIS — E785 Hyperlipidemia, unspecified: Secondary | ICD-10-CM

## 2022-11-05 DIAGNOSIS — F03B0 Unspecified dementia, moderate, without behavioral disturbance, psychotic disturbance, mood disturbance, and anxiety: Secondary | ICD-10-CM

## 2022-11-05 DIAGNOSIS — Z8546 Personal history of malignant neoplasm of prostate: Secondary | ICD-10-CM

## 2022-11-05 DIAGNOSIS — F339 Major depressive disorder, recurrent, unspecified: Secondary | ICD-10-CM | POA: Diagnosis not present

## 2022-11-05 DIAGNOSIS — E039 Hypothyroidism, unspecified: Secondary | ICD-10-CM | POA: Diagnosis not present

## 2022-11-05 LAB — ALT: ALT: 23 U/L (ref 0–53)

## 2022-11-05 LAB — PSA: PSA: 0 ng/mL — ABNORMAL LOW (ref 0.10–4.00)

## 2022-11-05 LAB — AST: AST: 24 U/L (ref 0–37)

## 2022-11-05 LAB — TSH: TSH: 1.5 u[IU]/mL (ref 0.35–5.50)

## 2022-11-05 MED ORDER — AMITRIPTYLINE HCL 10 MG PO TABS
10.0000 mg | ORAL_TABLET | Freq: Every day | ORAL | 1 refills | Status: DC
  Filled 2022-11-05: qty 30, 30d supply, fill #0
  Filled 2022-11-26 – 2022-11-29 (×2): qty 30, 30d supply, fill #1

## 2022-11-05 NOTE — Patient Instructions (Addendum)
Start amitriptyline 10 mg 1 tablet at bedtime. Okay to stop melatonin because this medication will cause you to sleep. If you have side effects such as: Shakiness, fever, excessive sweats, confusion: Stop the medication immediately and let me know. Let me know in 3 weeks if this medication is helping you.   Proceed with blood work  Schedule a follow up visit in 3 months   Vaccines I recommend:  RSV vaccine Covid booster Flu shot this fall

## 2022-11-05 NOTE — Progress Notes (Signed)
Subjective:    Patient ID: Brandon Burke, male    DOB: [birth year], 89 y.o.   MRN: [id number]  DOS:  11/05/2022 Type of visit - description: Follow-up, here with his wife  Chronic medical problems addressed. Physically he feels well, denies chest pain or difficulty breathing. No blood in the stools or blood in the urine.  Still feeling depressed about his overall health and  particularly blindness. He feels discouraged. I ask about suicidal ideation, and he admits had fleeting S/I , he has talked with his wife about them, he states he will not pursue them.   Review of Systems See above   Past Medical History:  Diagnosis Date   Constipation    DVT (deep venous thrombosis) (HCC)    hx of 2001 (after prostate surgery)     Glaucoma    Hyperlipidemia    Hypertension    Melanoma ([facility])    face, hand   Mitral valve prolapse    OSA (obstructive sleep apnea) 11/26/2020   PE (pulmonary embolism)    5-09:was referred to hematology, and they recommend to discontinue Coumadin 5/11   Prostate cancer (HCC)     released from urology in 2010,needs yearly PSAs with PCP   Seasonal allergies     Past Surgical History:  Procedure Laterality Date   CATARACT EXTRACTION Bilateral    EYE SURGERY Bilateral    Cat Sx   PARS PLANA VITRECTOMY Left 09/06/2019   Procedure: PARS PLANA VITRECTOMY WITH 25 GAUGE WITH INTRAVITREAL ANTIBIOTICS;  Surgeon: Rennis Chris, MD;  Location: [facility] OR;  Service: Ophthalmology;  Laterality: Left;   PHOTOCOAGULATION WITH LASER Left 09/06/2019   Procedure: Photocoagulation With Laser;  Surgeon: Rennis Chris, MD;  Location: [facility] OR;  Service: Ophthalmology;  Laterality: Left;   PROSTATECTOMY  2000   RUPTURED GLOBE EXPLORATION AND REPAIR Left 08/16/2019   Procedure: OPEN GLOBE EXPLORATION EYE POSSIBLE ANTERIOR  CHAMBER WASH OUT;  Surgeon: Shon Millet, MD;  Location: [facility] OR;  Service: Ophthalmology;  Laterality: Left;    Current Outpatient Medications  Medication  Instructions   acetaminophen (TYLENOL) 650 mg, Oral, Every 6 hours PRN   apixaban (ELIQUIS) 2.5 mg, Oral, 2 times daily   ARTIFICIAL TEAR OP 1 drop, Both Eyes, As needed   atenolol (TENORMIN) 25 mg, Oral, Daily   azelastine (ASTELIN) 0.1 % nasal spray 2 sprays, Each Nare, 2 times daily, Use in each nostril as directed   calcium carbonate (TUMS - DOSED IN MG ELEMENTAL CALCIUM) 500 MG chewable tablet 1-2 tablets, Oral, As needed   docusate sodium (COLACE) 100 mg, Oral, 2 times daily   donepezil (ARICEPT) 10 mg, Oral, Daily at bedtime   dorzolamide-timolol (COSOPT) 2-0.5 % ophthalmic solution 1 drop, Both Eyes, 2 times daily   escitalopram (LEXAPRO) 20 mg, Oral, Daily   fluorouracil (EFUDEX) 5 % cream Topical, 2 times daily, Use as directed on scalp   fluticasone (FLONASE) 50 MCG/ACT nasal spray 2 sprays, Each Nare, Daily PRN   latanoprost (XALATAN) 0.005 % ophthalmic solution 1 drop, Both Eyes, Every evening   levothyroxine (SYNTHROID) 50 mcg, Oral, Daily before breakfast   MELATONIN PO 4 mg, Oral, Daily at bedtime   Multiple Vitamin (MULTIVITAMIN) tablet 1 tablet, Oral, Daily, Takes at lunch   polyethylene glycol (MIRALAX / GLYCOLAX) 17 g, Oral, Daily   pravastatin (PRAVACHOL) 40 mg, Oral, Daily at bedtime   Travoprost, BAK Free, (TRAVATAN) 0.004 % SOLN ophthalmic solution 1 drop, Daily at bedtime   Vitamin D3  1,000 Units, Oral, Daily       Objective:   Physical Exam BP 122/66   Pulse (!) 54   Temp 98 F (36.7 C) (Oral)   Resp 18   Ht 6' (1.829 m)   Wt 167 lb 8 oz (76 kg)   SpO2 96%   BMI 22.72 kg/m  General:   Well developed, NAD, BMI noted. HEENT:  Normocephalic . Face symmetric, atraumatic Lungs:  CTA B Normal respiratory effort, no intercostal retractions, no accessory muscle use. Heart: RRR,  no murmur.  Lower extremities: no pretibial edema bilaterally  Skin: Not pale. Not jaundice Neurologic:  alert & oriented X3.  Speech normal, gait appropriate for age assisted  by cane Psych--  Cognition and judgment appear intact.  Cooperative with normal attention span and concentration.  Behavior appropriate. No anxious or depressed appearing.      Assessment   Assessment Hyperlipidemia Hypothyroidism, diagnosed 08-2020. Depression: On Lexapro, intolerant to Wellbutrin 6 12-2021 OSA Dx 09/01/2020.unable to use a CPAP Prostate cancer, released from urology 2010, Rx yearly PSAs by PCP MVP,  h/o palpitations: on chronic atenolol   HEM: DVT 2001 after surgery; PE 2009, eval by hematology, they recommend DC Coumadin 2011.  Spontaneous right leg DVT 11/2019, anticoag x life (dose decreased to Eliquis 2.5 twice daily on 04/2020) H/o MVP-- on BB glaucoma Sees dermatology q 6 months  Facial FX 08-2019, lost L eye vision 01-2021: Fall, TBI, facial lacerations, multiple rib fractures,    PLAN Hyperlipidemia: On Pravachol, check LFTs. Hypothyroidism: On Synthroid, check TSH. Depression: This was the main topic of this visit, on Lexapro 20 mg daily, symptoms not completely well-controlled.  He had fleeting suicidal ideas but states he will not act upon them. Previously intolerant to Wellbutrin.   Plan: add  amitriptyline 10 mg, as long as he does not have side effects we could increase the dose gradually.  We had a long conversation about potential s/e and they verbalized understanding.  They will let me know in 3 weeks how this is working for him. Social: Correction, they live at the Independent living side at [facility] RTC 3 months

## 2022-11-05 NOTE — Assessment & Plan Note (Signed)
Hyperlipidemia: On Pravachol, check LFTs. Hypothyroidism: On Synthroid, check TSH. Depression: This was the main topic of this visit, on Lexapro 20 mg daily, symptoms not completely well-controlled.  He had fleeting suicidal ideas but states he will not act upon them. Previously intolerant to Wellbutrin.   Plan: add  amitriptyline 10 mg, as long as he does not have side effects we could increase the dose gradually.  We had a long conversation about potential s/e and they verbalized understanding.  They will let me know in 3 weeks how this is working for him. Social: Correction, they live at the Independent living side at [facility] RTC 3 months

## 2022-11-11 ENCOUNTER — Other Ambulatory Visit: Payer: Self-pay

## 2022-11-11 ENCOUNTER — Other Ambulatory Visit (HOSPITAL_COMMUNITY): Payer: Self-pay

## 2022-11-11 ENCOUNTER — Other Ambulatory Visit: Payer: Self-pay | Admitting: Internal Medicine

## 2022-11-11 MED ORDER — APIXABAN 2.5 MG PO TABS
2.5000 mg | ORAL_TABLET | Freq: Two times a day (BID) | ORAL | 1 refills | Status: DC
  Filled 2022-11-11: qty 180, 90d supply, fill #0
  Filled 2023-02-11: qty 60, 30d supply, fill #1
  Filled 2023-02-21: qty 180, 90d supply, fill #1

## 2022-11-20 ENCOUNTER — Other Ambulatory Visit (HOSPITAL_COMMUNITY): Payer: Self-pay

## 2022-11-20 ENCOUNTER — Other Ambulatory Visit: Payer: Self-pay | Admitting: Internal Medicine

## 2022-11-22 ENCOUNTER — Other Ambulatory Visit: Payer: Self-pay

## 2022-11-22 MED FILL — Donepezil Hydrochloride Tab 10 MG: ORAL | 90 days supply | Qty: 90 | Fill #0 | Status: AC

## 2022-11-26 ENCOUNTER — Other Ambulatory Visit: Payer: Self-pay

## 2022-11-29 ENCOUNTER — Other Ambulatory Visit: Payer: Self-pay

## 2022-11-29 ENCOUNTER — Other Ambulatory Visit (HOSPITAL_COMMUNITY): Payer: Self-pay

## 2022-11-29 ENCOUNTER — Other Ambulatory Visit: Payer: Self-pay | Admitting: Internal Medicine

## 2022-11-29 MED ORDER — AMITRIPTYLINE HCL 10 MG PO TABS
10.0000 mg | ORAL_TABLET | Freq: Every day | ORAL | 1 refills | Status: DC
  Filled 2022-11-29 – 2022-12-06 (×2): qty 90, 90d supply, fill #0
  Filled 2023-03-02: qty 90, 90d supply, fill #1

## 2022-12-02 ENCOUNTER — Other Ambulatory Visit: Payer: Self-pay | Admitting: Internal Medicine

## 2022-12-02 ENCOUNTER — Other Ambulatory Visit: Payer: Self-pay

## 2022-12-02 ENCOUNTER — Other Ambulatory Visit (HOSPITAL_COMMUNITY): Payer: Self-pay

## 2022-12-02 MED ORDER — FLUTICASONE PROPIONATE 50 MCG/ACT NA SUSP
2.0000 | Freq: Every day | NASAL | 1 refills | Status: AC | PRN
  Filled 2022-12-02: qty 48, 90d supply, fill #0
  Filled 2023-05-25: qty 48, 90d supply, fill #1

## 2022-12-04 ENCOUNTER — Other Ambulatory Visit (HOSPITAL_COMMUNITY): Payer: Self-pay

## 2022-12-06 ENCOUNTER — Other Ambulatory Visit (HOSPITAL_COMMUNITY): Payer: Self-pay

## 2022-12-06 ENCOUNTER — Other Ambulatory Visit: Payer: Self-pay

## 2022-12-07 ENCOUNTER — Other Ambulatory Visit (HOSPITAL_COMMUNITY): Payer: Self-pay

## 2022-12-08 ENCOUNTER — Other Ambulatory Visit (HOSPITAL_COMMUNITY): Payer: Self-pay

## 2022-12-09 ENCOUNTER — Other Ambulatory Visit (HOSPITAL_BASED_OUTPATIENT_CLINIC_OR_DEPARTMENT_OTHER): Payer: Self-pay

## 2022-12-11 ENCOUNTER — Other Ambulatory Visit (HOSPITAL_COMMUNITY): Payer: Self-pay

## 2022-12-13 ENCOUNTER — Other Ambulatory Visit (HOSPITAL_COMMUNITY): Payer: Self-pay

## 2022-12-13 ENCOUNTER — Other Ambulatory Visit: Payer: Self-pay

## 2022-12-13 MED FILL — Levothyroxine Sodium Tab 50 MCG: ORAL | 90 days supply | Qty: 90 | Fill #0 | Status: AC

## 2022-12-23 ENCOUNTER — Other Ambulatory Visit (HOSPITAL_COMMUNITY): Payer: Self-pay

## 2022-12-23 ENCOUNTER — Other Ambulatory Visit: Payer: Self-pay

## 2023-01-05 ENCOUNTER — Other Ambulatory Visit: Payer: Self-pay

## 2023-01-05 ENCOUNTER — Other Ambulatory Visit (HOSPITAL_COMMUNITY): Payer: Self-pay

## 2023-01-05 ENCOUNTER — Other Ambulatory Visit: Payer: Self-pay | Admitting: Internal Medicine

## 2023-01-05 MED ORDER — ESCITALOPRAM OXALATE 10 MG PO TABS
20.0000 mg | ORAL_TABLET | Freq: Every day | ORAL | 1 refills | Status: DC
  Filled 2023-01-05: qty 180, 90d supply, fill #0
  Filled 2023-03-31: qty 180, 90d supply, fill #1

## 2023-01-28 DIAGNOSIS — L602 Onychogryphosis: Secondary | ICD-10-CM | POA: Diagnosis not present

## 2023-01-28 DIAGNOSIS — M79672 Pain in left foot: Secondary | ICD-10-CM | POA: Diagnosis not present

## 2023-01-28 DIAGNOSIS — M79671 Pain in right foot: Secondary | ICD-10-CM | POA: Diagnosis not present

## 2023-02-11 ENCOUNTER — Other Ambulatory Visit (HOSPITAL_COMMUNITY): Payer: Self-pay

## 2023-02-14 ENCOUNTER — Other Ambulatory Visit (HOSPITAL_COMMUNITY): Payer: Self-pay

## 2023-02-15 ENCOUNTER — Ambulatory Visit: Payer: PPO | Admitting: Internal Medicine

## 2023-02-18 DIAGNOSIS — H4032X3 Glaucoma secondary to eye trauma, left eye, severe stage: Secondary | ICD-10-CM | POA: Diagnosis not present

## 2023-02-18 DIAGNOSIS — H401111 Primary open-angle glaucoma, right eye, mild stage: Secondary | ICD-10-CM | POA: Diagnosis not present

## 2023-02-21 ENCOUNTER — Other Ambulatory Visit: Payer: Self-pay

## 2023-02-21 ENCOUNTER — Other Ambulatory Visit (HOSPITAL_COMMUNITY): Payer: Self-pay

## 2023-02-21 MED FILL — Donepezil Hydrochloride Tab 10 MG: ORAL | 90 days supply | Qty: 90 | Fill #1 | Status: AC

## 2023-02-22 ENCOUNTER — Ambulatory Visit (INDEPENDENT_AMBULATORY_CARE_PROVIDER_SITE_OTHER): Payer: PPO | Admitting: Internal Medicine

## 2023-02-22 ENCOUNTER — Other Ambulatory Visit: Payer: Self-pay

## 2023-02-22 ENCOUNTER — Encounter: Payer: Self-pay | Admitting: Internal Medicine

## 2023-02-22 VITALS — BP 116/64 | HR 54 | Temp 97.9°F | Resp 16 | Ht 72.0 in | Wt 167.0 lb

## 2023-02-22 DIAGNOSIS — F03B0 Unspecified dementia, moderate, without behavioral disturbance, psychotic disturbance, mood disturbance, and anxiety: Secondary | ICD-10-CM | POA: Diagnosis not present

## 2023-02-22 DIAGNOSIS — F339 Major depressive disorder, recurrent, unspecified: Secondary | ICD-10-CM

## 2023-02-22 DIAGNOSIS — E785 Hyperlipidemia, unspecified: Secondary | ICD-10-CM | POA: Diagnosis not present

## 2023-02-22 DIAGNOSIS — E039 Hypothyroidism, unspecified: Secondary | ICD-10-CM

## 2023-02-22 LAB — BASIC METABOLIC PANEL
BUN: 23 mg/dL (ref 6–23)
CO2: 29 meq/L (ref 19–32)
Calcium: 9.1 mg/dL (ref 8.4–10.5)
Chloride: 106 meq/L (ref 96–112)
Creatinine, Ser: 1.26 mg/dL (ref 0.40–1.50)
GFR: 50.68 mL/min — ABNORMAL LOW (ref >60.00)
Glucose, Bld: 91 mg/dL (ref 70–99)
Potassium: 4.3 meq/L (ref 3.5–5.1)
Sodium: 142 meq/L (ref 135–145)

## 2023-02-22 LAB — LIPID PANEL
Cholesterol: 133 mg/dL (ref 0–200)
HDL: 32.5 mg/dL — ABNORMAL LOW (ref >39.00)
LDL Cholesterol: 60 mg/dL (ref 0–99)
NonHDL: 100.46
Total CHOL/HDL Ratio: 4
Triglycerides: 201 mg/dL — ABNORMAL HIGH (ref 0.0–149.0)
VLDL: 40.2 mg/dL — ABNORMAL HIGH (ref 0.0–40.0)

## 2023-02-22 LAB — TSH: TSH: 2.04 u[IU]/mL (ref 0.35–5.50)

## 2023-02-22 NOTE — Assessment & Plan Note (Signed)
Hyperlipidemia: On Pravachol, checking labs.  Adjust medicines if necessary Hypothyroidism,On Synthroid, check TSH.  Adjust medicines if necessary Depression:  See LOV, on Lexapro, amitriptyline added, benefit?  Patient not sure.   He also reports feeling sleepy throughout the day, not sure if worse since he started amitriptyline.  He had no new falls and reports he does not feel less steady.  After a conversation we agreed to stay on both Cipro and amitriptyline. Dementia: Memory issues started after TBI in 2022, initially had some behavioral issues, currently does not.  We talk about possibly adding Namenda or referred to neurology but they declined it.  Continue Aricept. Vaccine advice provided RTC 4 months

## 2023-02-22 NOTE — Progress Notes (Signed)
Subjective:    Patient ID: Brandon Burke, male    DOB: July 29, 1931, 89 y.o.   MRN: [id number]  DOS:  02/22/2023 Type of visit - description: Follow-up, here with his wife  Since the last office visit he has no new concerns. Wife report dementia is about the same.  No wandering, no aggressive behavior. Sleep well at night but falls asleep throughout the day aswell.  No falls.   Review of Systems See above   Past Medical History:  Diagnosis Date   Constipation    DVT (deep venous thrombosis) (HCC)    hx of 2001 (after prostate surgery)     Glaucoma    Hyperlipidemia    Hypertension    Melanoma (HCC)    face, hand   Mitral valve prolapse    OSA (obstructive sleep apnea) 11/26/2020   PE (pulmonary embolism)    5-09:was referred to hematology, and they recommend to discontinue Coumadin 5/11   Prostate cancer (HCC)     released from urology in 2010,needs yearly PSAs with PCP   Seasonal allergies     Past Surgical History:  Procedure Laterality Date   CATARACT EXTRACTION Bilateral    EYE SURGERY Bilateral    Cat Sx   PARS PLANA VITRECTOMY Left 09/06/2019   Procedure: PARS PLANA VITRECTOMY WITH 25 GAUGE WITH INTRAVITREAL ANTIBIOTICS;  Surgeon: Rennis Chris, MD;  Location: [facility] OR;  Service: Ophthalmology;  Laterality: Left;   PHOTOCOAGULATION WITH LASER Left 09/06/2019   Procedure: Photocoagulation With Laser;  Surgeon: Rennis Chris, MD;  Location: [facility] OR;  Service: Ophthalmology;  Laterality: Left;   PROSTATECTOMY  2000   RUPTURED GLOBE EXPLORATION AND REPAIR Left 08/16/2019   Procedure: OPEN GLOBE EXPLORATION EYE POSSIBLE ANTERIOR  CHAMBER WASH OUT;  Surgeon: Shon Millet, MD;  Location: [facility] OR;  Service: Ophthalmology;  Laterality: Left;    Current Outpatient Medications  Medication Instructions   acetaminophen (TYLENOL) 650 mg, Oral, Every 6 hours PRN   amitriptyline (ELAVIL) 10 mg, Oral, Daily at bedtime   ARTIFICIAL TEAR OP 1 drop, Both Eyes, As needed    atenolol (TENORMIN) 25 mg, Oral, Daily   azelastine (ASTELIN) 0.1 % nasal spray 2 sprays, Each Nare, 2 times daily, Use in each nostril as directed   calcium carbonate (TUMS - DOSED IN MG ELEMENTAL CALCIUM) 500 MG chewable tablet 1-2 tablets, Oral, As needed   docusate sodium (COLACE) 100 mg, Oral, 2 times daily   donepezil (ARICEPT) 10 mg, Oral, Daily at bedtime   dorzolamide-timolol (COSOPT) 2-0.5 % ophthalmic solution 1 drop, Both Eyes, 2 times daily   Eliquis 2.5 mg, Oral, 2 times daily   escitalopram (LEXAPRO) 20 mg, Oral, Daily   fluorouracil (EFUDEX) 5 % cream Topical, 2 times daily, Use as directed on scalp   fluticasone (FLONASE) 50 MCG/ACT nasal spray 2 sprays, Each Nare, Daily PRN   latanoprost (XALATAN) 0.005 % ophthalmic solution 1 drop, Both Eyes, Every evening   levothyroxine (SYNTHROID) 50 mcg, Oral, Daily before breakfast   MELATONIN PO 4 mg, Daily at bedtime   Multiple Vitamin (MULTIVITAMIN) tablet 1 tablet, Oral, Daily, Takes at lunch   polyethylene glycol (MIRALAX / GLYCOLAX) 17 g, Oral, Daily   pravastatin (PRAVACHOL) 40 mg, Oral, Daily at bedtime   Travoprost, BAK Free, (TRAVATAN) 0.004 % SOLN ophthalmic solution 1 drop, Daily at bedtime   Vitamin D3 1,000 Units, Oral, Daily       Objective:   Physical Exam BP 116/64   Pulse Marland Kitchen)  54   Temp 97.9 F (36.6 C) (Oral)   Resp 16   Ht 6' (1.829 m)   Wt 167 lb (75.8 kg)   SpO2 96%   BMI 22.65 kg/m  General:   Well developed, NAD, BMI noted. HEENT:  Normocephalic . Face symmetric, atraumatic Lungs:  CTA B Normal respiratory effort, no intercostal retractions, no accessory muscle use. Heart: RRR,  no murmur.  Lower extremities: no pretibial edema bilaterally  Skin: Not pale. Not jaundice Neurologic:  alert & oriented to self, recognizes his wife.  Knows where he is at, remember his phone number but not his address. Speech normal, gait assisted by cane Psych--  Behavior appropriate. No anxious or depressed  appearing.      Assessment     Assessment Hyperlipidemia Hypothyroidism, diagnosed 08-2020. Depression: On Lexapro, intolerant to Wellbutrin 6 12-2021 OSA Dx 09/01/2020.unable to use a CPAP Prostate cancer, released from urology 2010, Rx yearly PSAs by PCP MVP,  h/o palpitations: on chronic atenolol   HEM: DVT 2001 after surgery; PE 2009, eval by hematology, they recommend DC Coumadin 2011.  Spontaneous right leg DVT 11/2019, anticoag x life (dose decreased to Eliquis 2.5 twice daily on 04/2020) H/o MVP-- on BB glaucoma Sees dermatology q 6 months  Facial FX 08-2019, lost L eye vision 01-2021: Fall, TBI, facial lacerations, multiple rib fractures, Dementia-- MCI started after TBI  PLAN Hyperlipidemia: On Pravachol, checking labs.  Adjust medicines if necessary Hypothyroidism,On Synthroid, check TSH.  Adjust medicines if necessary Depression:  See LOV, on Lexapro, amitriptyline added, benefit?  Patient not sure.   He also reports feeling sleepy throughout the day, not sure if worse since he started amitriptyline.  He had no new falls and reports he does not feel less steady.  After a conversation we agreed to stay on both Cipro and amitriptyline. Dementia: Memory issues started after TBI in 2022, initially had some behavioral issues, currently does not.  We talk about possibly adding Namenda or referred to neurology but they declined it.  Continue Aricept. Vaccine advice provided RTC 4 months

## 2023-02-22 NOTE — Patient Instructions (Addendum)
Vaccines I recommend: Covid booster RSV vaccine    GO TO THE LAB : Get the blood work     Next visit with me in 4 months for a physical exam     Please schedule it at the front desk

## 2023-02-28 ENCOUNTER — Other Ambulatory Visit: Payer: Self-pay | Admitting: Internal Medicine

## 2023-02-28 ENCOUNTER — Other Ambulatory Visit: Payer: Self-pay

## 2023-02-28 ENCOUNTER — Other Ambulatory Visit (HOSPITAL_COMMUNITY): Payer: Self-pay

## 2023-02-28 ENCOUNTER — Telehealth: Payer: Self-pay | Admitting: Internal Medicine

## 2023-02-28 DIAGNOSIS — L570 Actinic keratosis: Secondary | ICD-10-CM | POA: Diagnosis not present

## 2023-02-28 DIAGNOSIS — Z85828 Personal history of other malignant neoplasm of skin: Secondary | ICD-10-CM | POA: Diagnosis not present

## 2023-02-28 MED ORDER — PRAVASTATIN SODIUM 40 MG PO TABS
40.0000 mg | ORAL_TABLET | Freq: Every day | ORAL | 1 refills | Status: DC
  Filled 2023-02-28: qty 90, 90d supply, fill #0
  Filled 2023-05-25: qty 90, 90d supply, fill #1

## 2023-02-28 NOTE — Telephone Encounter (Signed)
Patients wife called and needing a med refill  pravastatin (PRAVACHOL) 40 MG tablet  Please send to [facility] .  Patients wife states does not have many left .

## 2023-02-28 NOTE — Telephone Encounter (Signed)
Rx already sent this morning,

## 2023-03-03 ENCOUNTER — Other Ambulatory Visit (HOSPITAL_COMMUNITY): Payer: Self-pay

## 2023-03-31 MED FILL — Levothyroxine Sodium Tab 50 MCG: ORAL | 90 days supply | Qty: 90 | Fill #1 | Status: AC

## 2023-04-01 ENCOUNTER — Other Ambulatory Visit: Payer: Self-pay

## 2023-04-21 ENCOUNTER — Other Ambulatory Visit: Payer: Self-pay

## 2023-04-21 ENCOUNTER — Other Ambulatory Visit: Payer: Self-pay | Admitting: Internal Medicine

## 2023-04-21 ENCOUNTER — Other Ambulatory Visit (HOSPITAL_COMMUNITY): Payer: Self-pay

## 2023-04-21 MED ORDER — AMITRIPTYLINE HCL 10 MG PO TABS
10.0000 mg | ORAL_TABLET | Freq: Every day | ORAL | 1 refills | Status: AC
  Filled 2023-04-21 – 2023-05-25 (×2): qty 90, 90d supply, fill #0
  Filled 2023-08-11: qty 90, 90d supply, fill #1

## 2023-04-26 ENCOUNTER — Other Ambulatory Visit (HOSPITAL_COMMUNITY): Payer: Self-pay

## 2023-05-24 DIAGNOSIS — M79672 Pain in left foot: Secondary | ICD-10-CM | POA: Diagnosis not present

## 2023-05-24 DIAGNOSIS — M79671 Pain in right foot: Secondary | ICD-10-CM | POA: Diagnosis not present

## 2023-05-24 DIAGNOSIS — L602 Onychogryphosis: Secondary | ICD-10-CM | POA: Diagnosis not present

## 2023-05-25 ENCOUNTER — Other Ambulatory Visit: Payer: Self-pay

## 2023-05-25 ENCOUNTER — Other Ambulatory Visit: Payer: Self-pay | Admitting: Internal Medicine

## 2023-05-25 ENCOUNTER — Other Ambulatory Visit (HOSPITAL_COMMUNITY): Payer: Self-pay

## 2023-05-25 MED ORDER — ESCITALOPRAM OXALATE 10 MG PO TABS
20.0000 mg | ORAL_TABLET | Freq: Every day | ORAL | 1 refills | Status: DC
  Filled 2023-05-25 – 2023-07-21 (×2): qty 180, 90d supply, fill #0
  Filled 2023-10-12: qty 180, 90d supply, fill #1

## 2023-05-25 MED ORDER — LEVOTHYROXINE SODIUM 50 MCG PO TABS
50.0000 ug | ORAL_TABLET | Freq: Every day | ORAL | 1 refills | Status: AC
  Filled 2023-05-25 – 2023-07-21 (×2): qty 90, 90d supply, fill #0
  Filled 2023-10-12: qty 90, 90d supply, fill #1

## 2023-05-25 MED ORDER — DONEPEZIL HCL 10 MG PO TABS
10.0000 mg | ORAL_TABLET | Freq: Every day | ORAL | 1 refills | Status: AC
  Filled 2023-05-25: qty 90, 90d supply, fill #0
  Filled 2023-09-06: qty 90, 90d supply, fill #1

## 2023-05-25 MED ORDER — APIXABAN 2.5 MG PO TABS
2.5000 mg | ORAL_TABLET | Freq: Two times a day (BID) | ORAL | 1 refills | Status: DC
  Filled 2023-05-25: qty 180, 90d supply, fill #0
  Filled 2023-08-08: qty 180, 90d supply, fill #1

## 2023-05-30 ENCOUNTER — Other Ambulatory Visit (HOSPITAL_COMMUNITY): Payer: Self-pay

## 2023-06-24 ENCOUNTER — Other Ambulatory Visit (HOSPITAL_COMMUNITY): Payer: Self-pay

## 2023-06-24 MED ORDER — LATANOPROST 0.005 % OP SOLN
1.0000 [drp] | Freq: Every day | OPHTHALMIC | 3 refills | Status: AC
  Filled 2023-06-24: qty 2.5, 50d supply, fill #0
  Filled 2023-08-08: qty 7.5, 75d supply, fill #1

## 2023-06-28 ENCOUNTER — Encounter: Payer: Self-pay | Admitting: Internal Medicine

## 2023-06-28 ENCOUNTER — Other Ambulatory Visit (HOSPITAL_COMMUNITY): Payer: Self-pay

## 2023-06-28 ENCOUNTER — Ambulatory Visit (INDEPENDENT_AMBULATORY_CARE_PROVIDER_SITE_OTHER): Payer: PPO | Admitting: Internal Medicine

## 2023-06-28 VITALS — BP 128/76 | HR 55 | Temp 97.5°F | Ht 72.0 in | Wt 161.2 lb

## 2023-06-28 DIAGNOSIS — R5382 Chronic fatigue, unspecified: Secondary | ICD-10-CM | POA: Diagnosis not present

## 2023-06-28 DIAGNOSIS — I10 Essential (primary) hypertension: Secondary | ICD-10-CM

## 2023-06-28 DIAGNOSIS — E039 Hypothyroidism, unspecified: Secondary | ICD-10-CM

## 2023-06-28 LAB — CBC WITH DIFFERENTIAL/PLATELET
Basophils Absolute: 0.1 10*3/uL (ref 0.0–0.1)
Basophils Relative: 0.8 % (ref 0.0–3.0)
Eosinophils Absolute: 0.3 10*3/uL (ref 0.0–0.7)
Eosinophils Relative: 4.4 % (ref 0.0–5.0)
HCT: 45.4 % (ref 39.0–52.0)
Hemoglobin: 15.3 g/dL (ref 13.0–17.0)
Lymphocytes Relative: 17.6 % (ref 12.0–46.0)
Lymphs Abs: 1.1 10*3/uL (ref 0.7–4.0)
MCHC: 33.6 g/dL (ref 30.0–36.0)
MCV: 99.9 fL (ref 78.0–100.0)
Monocytes Absolute: 0.8 10*3/uL (ref 0.1–1.0)
Monocytes Relative: 11.8 % (ref 3.0–12.0)
Neutro Abs: 4.2 10*3/uL (ref 1.4–7.7)
Neutrophils Relative %: 65.4 % (ref 43.0–77.0)
Platelets: 159 10*3/uL (ref 150.0–400.0)
RBC: 4.55 Mil/uL (ref 4.22–5.81)
RDW: 12.9 % (ref 11.5–15.5)
WBC: 6.5 10*3/uL (ref 4.0–10.5)

## 2023-06-28 LAB — BASIC METABOLIC PANEL
BUN: 28 mg/dL — ABNORMAL HIGH (ref 6–23)
CO2: 31 meq/L (ref 19–32)
Calcium: 9.3 mg/dL (ref 8.4–10.5)
Chloride: 105 meq/L (ref 96–112)
Creatinine, Ser: 1.39 mg/dL (ref 0.40–1.50)
GFR: 44.94 mL/min — ABNORMAL LOW (ref >60.00)
Glucose, Bld: 66 mg/dL — ABNORMAL LOW (ref 70–99)
Potassium: 4.8 meq/L (ref 3.5–5.1)
Sodium: 143 meq/L (ref 135–145)

## 2023-06-28 LAB — TSH: TSH: 1.6 u[IU]/mL (ref 0.35–5.50)

## 2023-06-28 NOTE — Progress Notes (Addendum)
 Subjective:    Patient ID: Brandon Burke, male    DOB: April 29, 1932, 89 y.o.   MRN: [id number]  DOS:  06/28/2023 Type of visit - description: f/u  Here with his wife. She remains concerned about Brandon Burke's  lack of energy, he often times simply stay sitting at home and has no desire to do much. Specifically denies shortness of breath or edema. No cough. Depression: At times Brandon Burke admits to feeling sad   Review of Systems See above   Past Medical History:  Diagnosis Date   Constipation    DVT (deep venous thrombosis) (HCC)    hx of 2001 (after prostate surgery)     Glaucoma    Hyperlipidemia    Hypertension    Melanoma (HCC)    face, hand   Mitral valve prolapse    OSA (obstructive sleep apnea) 11/26/2020   PE (pulmonary embolism)    5-09:was referred to hematology, and they recommend to discontinue Coumadin 5/11   Prostate cancer [facility])     released from urology in 2010,needs yearly PSAs with PCP   Seasonal allergies     Past Surgical History:  Procedure Laterality Date   CATARACT EXTRACTION Bilateral    EYE SURGERY Bilateral    Cat Sx   PARS PLANA VITRECTOMY Left 09/06/2019   Procedure: PARS PLANA VITRECTOMY WITH 25 GAUGE WITH INTRAVITREAL ANTIBIOTICS;  Surgeon: Rennis Chris, MD;  Location: [facility] OR;  Service: Ophthalmology;  Laterality: Left;   PHOTOCOAGULATION WITH LASER Left 09/06/2019   Procedure: Photocoagulation With Laser;  Surgeon: Rennis Chris, MD;  Location: [facility] OR;  Service: Ophthalmology;  Laterality: Left;   PROSTATECTOMY  2000   RUPTURED GLOBE EXPLORATION AND REPAIR Left 08/16/2019   Procedure: OPEN GLOBE EXPLORATION EYE POSSIBLE ANTERIOR  CHAMBER WASH OUT;  Surgeon: Shon Millet, MD;  Location: [facility] OR;  Service: Ophthalmology;  Laterality: Left;    Current Outpatient Medications  Medication Instructions   acetaminophen (TYLENOL) 650 mg, Oral, Every 6 hours PRN   amitriptyline (ELAVIL) 10 mg, Oral, Daily at bedtime   ARTIFICIAL TEAR OP 1 drop, As  needed   atenolol (TENORMIN) 25 mg, Oral, Daily   azelastine (ASTELIN) 0.1 % nasal spray 2 sprays, 2 times daily   calcium carbonate (TUMS - DOSED IN MG ELEMENTAL CALCIUM) 500 MG chewable tablet 1-2 tablets, As needed   docusate sodium (COLACE) 100 mg, Oral, 2 times daily   donepezil (ARICEPT) 10 mg, Oral, Daily at bedtime   dorzolamide-timolol (COSOPT) 2-0.5 % ophthalmic solution 1 drop, Both Eyes, 2 times daily   Eliquis 2.5 mg, Oral, 2 times daily   escitalopram (LEXAPRO) 20 mg, Oral, Daily   fluorouracil (EFUDEX) 5 % cream 2 times daily   fluticasone (FLONASE) 50 MCG/ACT nasal spray 2 sprays, Each Nare, Daily PRN   latanoprost (XALATAN) 0.005 % ophthalmic solution 1 drop, Both Eyes, Every evening   latanoprost (XALATAN) 0.005 % ophthalmic solution 1 drop, Both Eyes, Daily at bedtime   levothyroxine (SYNTHROID) 50 mcg, Oral, Daily before breakfast   MELATONIN PO 4 mg, Daily at bedtime   Multiple Vitamin (MULTIVITAMIN) tablet 1 tablet, Daily   polyethylene glycol (MIRALAX / GLYCOLAX) 17 g, Oral, Daily   pravastatin (PRAVACHOL) 40 mg, Oral, Daily at bedtime   Vitamin D3 1,000 Units, Oral, Daily       Objective:   Physical Exam BP 128/76   Pulse (!) 55   Temp (!) 97.5 F (36.4 C)   Ht 6' (1.829 m)   [employer]  161 lb 3.2 oz (73.1 kg)   SpO2 95%   BMI 21.86 kg/m  General:   Well developed, NAD, BMI noted. HEENT:  Normocephalic . Face symmetric, atraumatic Lungs:  CTA B Normal respiratory effort, no intercostal retractions, no accessory muscle use. Heart: RRR,  no murmur.  Lower extremities: no pretibial edema bilaterally  Skin: Not pale. Not jaundice Neurologic:  alert & oriented X3.  Speech normal, gait appropriate for age   Psych--  Cognition and judgment appear intact.  Cooperative with normal attention span and concentration.  Behavior appropriate. Does not  seem depressed to me, he is smiling during most of the visit, still has a sense of humor.    Assessment       Assessment Hyperlipidemia Hypothyroidism, diagnosed 08-2020. Depression: On Lexapro, intolerant to Wellbutrin 6 12-2021 OSA Dx 09/01/2020.unable to use a CPAP Prostate cancer, released from urology 2010, Rx yearly PSAs by PCP MVP,  h/o palpitations: on chronic atenolol   HEM: DVT 2001 after surgery; PE 2009, eval by hematology, they recommend DC Coumadin 2011.  Spontaneous right leg DVT 11/2019, anticoag x life (dose decreased to Eliquis 2.5 twice daily on 04/2020) H/o MVP-- on BB glaucoma Sees dermatology q 6 months  Facial FX 08-2019, lost L eye vision 01-2021: Fall, TBI, facial lacerations, multiple rib fractures, Dementia-- MCI started after TBI  PLAN Hypothyroidism: Synthroid, check TSH. HTN  On atenolol, check labs. Fatigue. Patient is 89 years old, most of the concerns are  coming from his wife, patient tells me is normal  for anybody to feel sad and fatigue  from time to time particularly given his medical status, his age and poor vision. Thus, his mood and stamina seems appropriate in relationship  to his general health.  For now, rec no  further specific evaluation for fatigue unless something changes dramatically. He is starting to have some trouble with ADLs like dressing, they moved to [facility] 03-2016 and still living at the independent area, recommend to consider - sooner than later - move to a assisted living area. RTC 4 months.  Time spent 34 minutes talking about fatigue, advising patient that I understand his general mood.  Advised him to call if something change dramatically as he will need further workup if that ever happens.

## 2023-06-28 NOTE — Patient Instructions (Signed)
  Check the  blood pressure regularly Blood pressure goal:  between 110/65 and  135/85. If it is consistently higher or lower, let me know     GO TO THE LAB : Get the blood work     Next visit with me in 4 months Please schedule it at the front desk

## 2023-06-29 ENCOUNTER — Encounter: Payer: Self-pay | Admitting: Internal Medicine

## 2023-06-29 NOTE — Assessment & Plan Note (Addendum)
 Hypothyroidism: Synthroid, check TSH. HTN  On atenolol, check labs. Fatigue. Patient is 89 years old, most of the concerns are  coming from his wife, patient tells me is normal  for anybody to feel sad and fatigue  from time to time particularly given his medical status, his age and poor vision. Thus, his mood and stamina seems appropriate in relationship  to his general health.  For now, rec no  further specific evaluation for fatigue unless something changes dramatically. He is starting to have some trouble with ADLs like dressing, they moved to [facility] 03-2016 and still living at the independent area, recommend to consider - sooner than later - move to a assisted living area. RTC 4 months.

## 2023-07-21 ENCOUNTER — Other Ambulatory Visit: Payer: Self-pay | Admitting: Internal Medicine

## 2023-07-21 ENCOUNTER — Other Ambulatory Visit (HOSPITAL_COMMUNITY): Payer: Self-pay

## 2023-07-22 ENCOUNTER — Other Ambulatory Visit (HOSPITAL_COMMUNITY): Payer: Self-pay

## 2023-07-22 ENCOUNTER — Other Ambulatory Visit: Payer: Self-pay

## 2023-07-22 MED ORDER — ATENOLOL 25 MG PO TABS
25.0000 mg | ORAL_TABLET | Freq: Every day | ORAL | 1 refills | Status: DC
  Filled 2023-07-22: qty 90, 90d supply, fill #0
  Filled 2023-10-12: qty 90, 90d supply, fill #1

## 2023-07-25 ENCOUNTER — Encounter: Payer: Self-pay | Admitting: Internal Medicine

## 2023-07-26 IMAGING — CT CT MAXILLOFACIAL W/O CM
3 series · 15 of 47 positions shown, 18 images · non-contrast
Comparison: CT head and maxillofacial 08/09/2019

CLINICAL DATA: Facial trauma, fall on cement steps.

EXAM:
CT HEAD WITHOUT CONTRAST
CT MAXILLOFACIAL WITHOUT CONTRAST
CT CERVICAL SPINE WITHOUT CONTRAST
TECHNIQUE: Multidetector CT imaging of the head, cervical spine, and
maxillofacial structures were performed using the standard protocol
without intravenous contrast. Multiplanar CT image reconstructions
of the cervical spine and maxillofacial structures were also
generated.

[Series 3: facialbone 2.0 st · axial · 0.39mm/px · z∈[+999,+1171]mm · 9 of 100 slices shown, 12 images]
[im 7/100  brain]
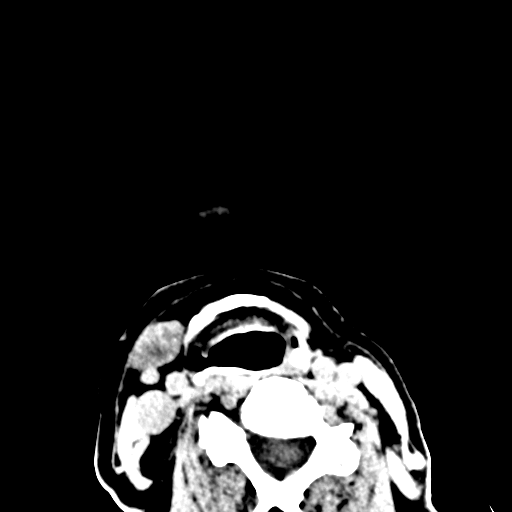
[im 7/100  bone]
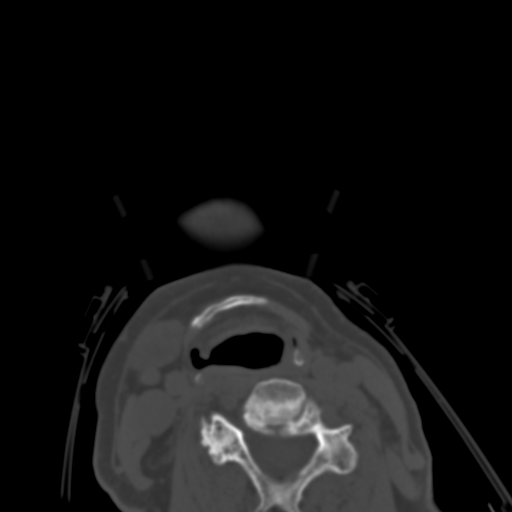
[im 18/100  bone]
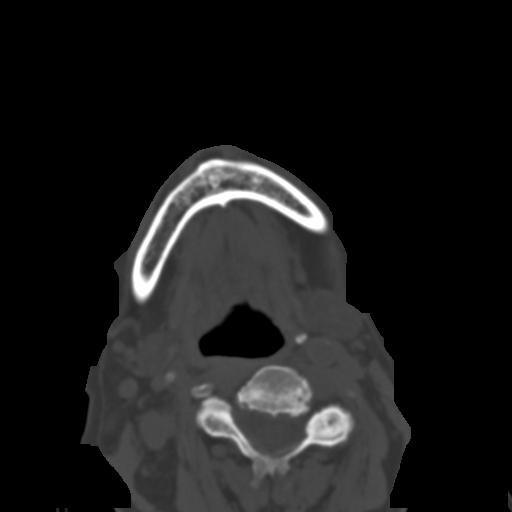
[im 28/100  bone]
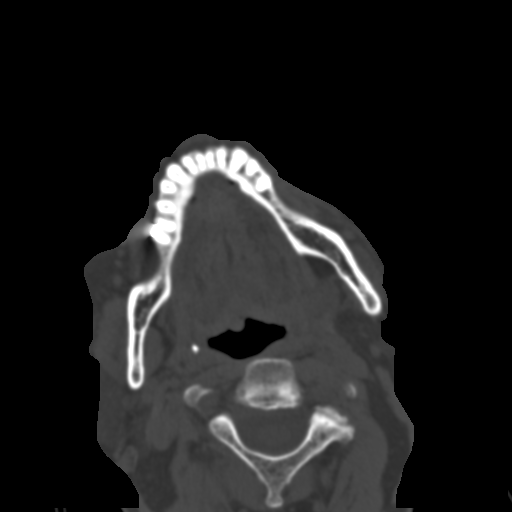
[im 38/100  bone]
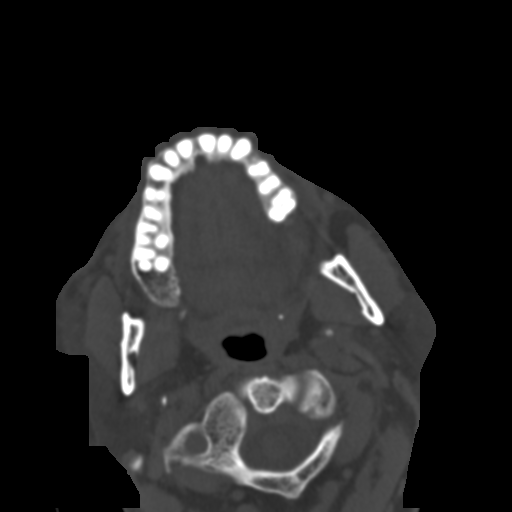
[im 52/100  brain]
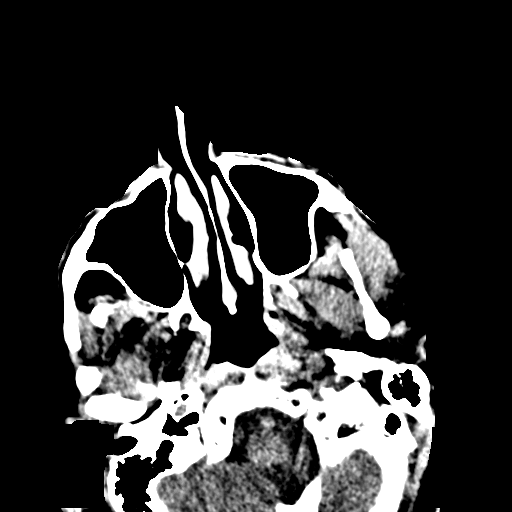
[im 52/100  bone]
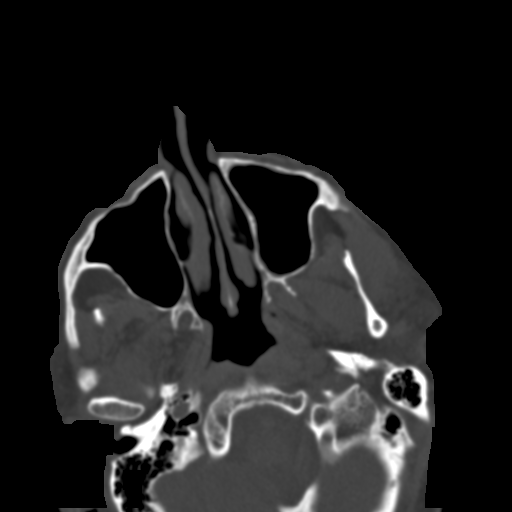
[im 62/100  bone]
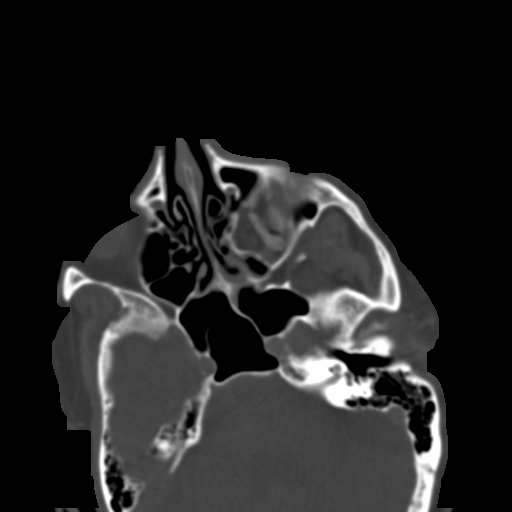
[im 72/100  bone]
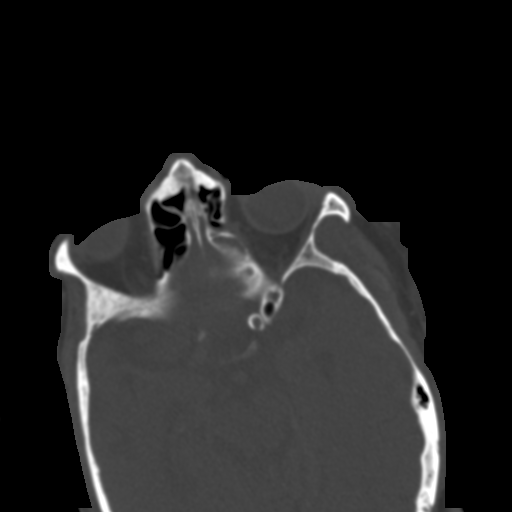
[im 82/100  bone]
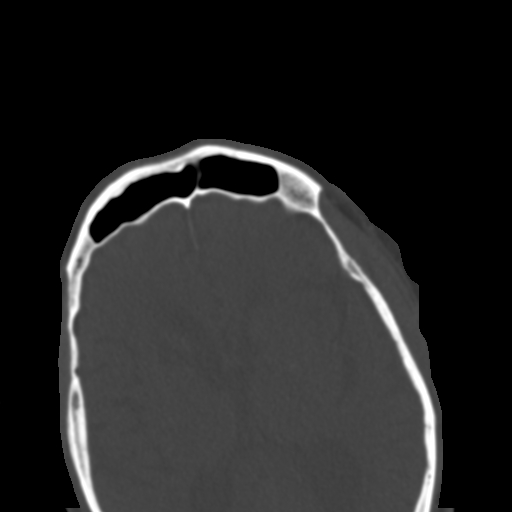
[im 93/100  brain]
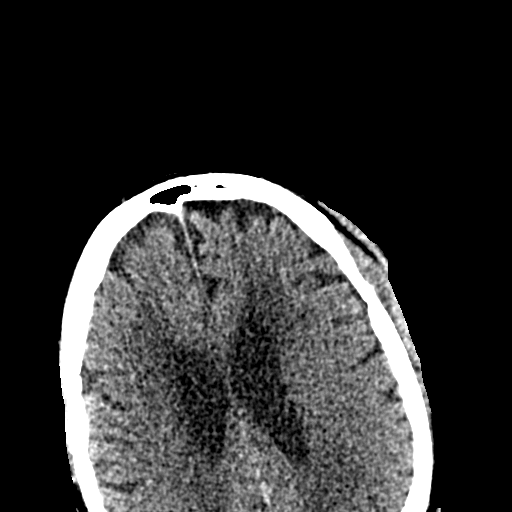
[im 93/100  bone]
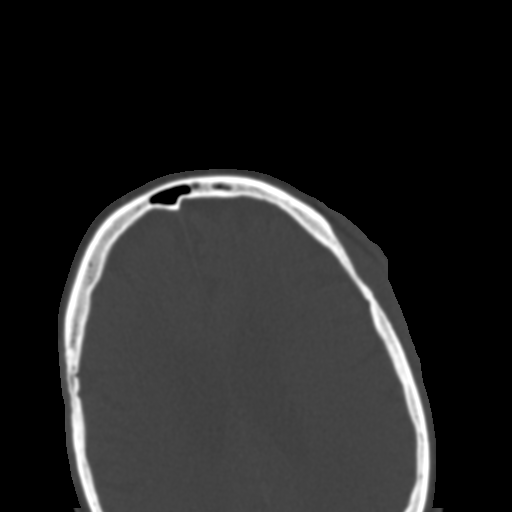

[Series 7: facialbone 2.0 cor st · coronal · 0.40mm/px · 3 of 88 slices shown]
[im 30/88  bone]
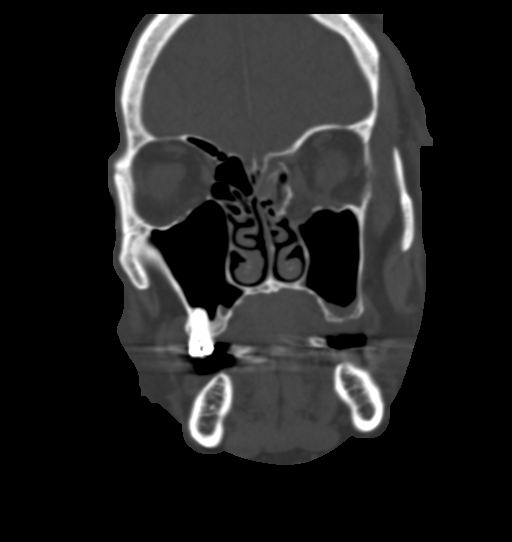
[im 39/88  bone]
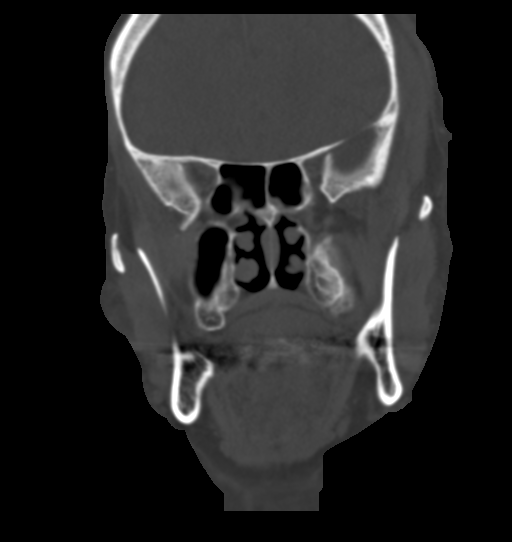
[im 49/88  bone]
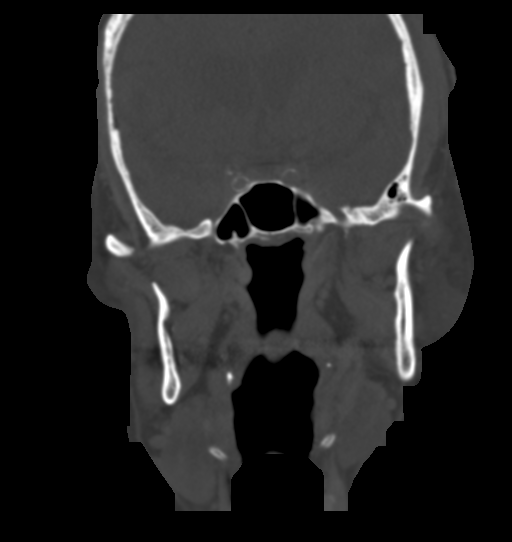

[Series 8: facialbone 2.0 sag st · sagittal · 0.43mm/px · 3 of 78 slices shown]
[im 26/78  bone]
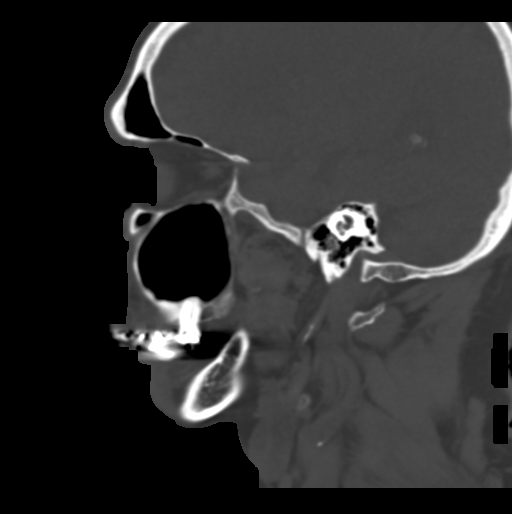
[im 39/78  bone]
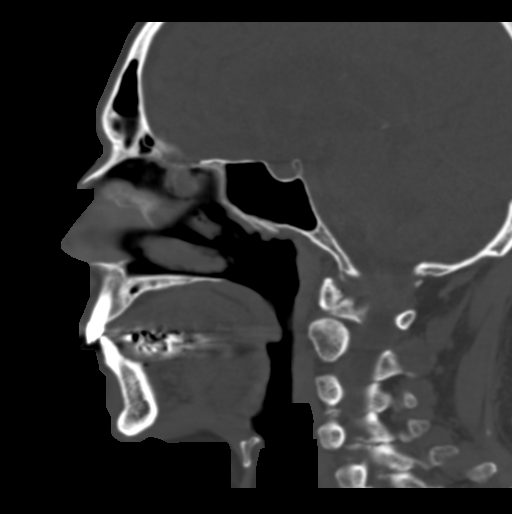
[im 52/78  bone]
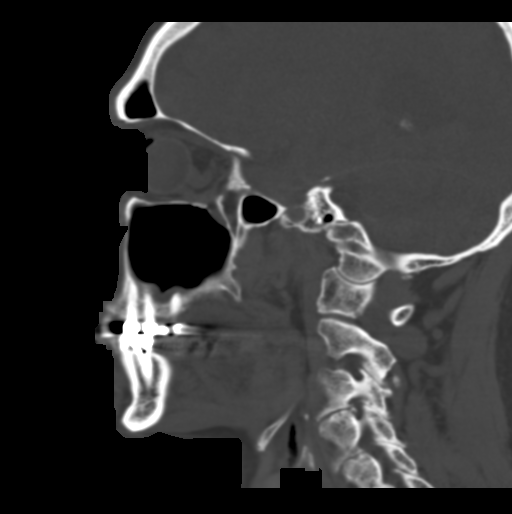

[15 of 47 positions shown; findings below may reference images not displayed]

FINDINGS: CT HEAD FINDINGS

Brain: No evidence of acute infarction, hemorrhage, hydrocephalus,
extra-axial collection or mass lesion/mass effect. Again seen is
mild diffuse atrophy. There is stable mild periventricular white
matter hypodensity, likely chronic small vessel ischemic change.
Small old infarct in the right frontal periventricular white matter
appears new from the prior examination.

Vascular: Atherosclerotic calcifications are present within the
cavernous internal carotid arteries.

Skull: Normal. Negative for fracture or focal lesion.

Other: There is left temporal scalp soft tissue swelling.

CT MAXILLOFACIAL FINDINGS

Osseous: No acute fractures are identified. There are old left
medial and left inferior orbital wall fractures. No dislocation.

Orbits: There is preseptal left orbital soft tissue swelling. Globes
are intact bilaterally. Postseptal orbital soft tissues are within
normal limits.

Sinuses: There is scattered opacification of left ethmoid air cells.
There is mild mucosal thickening of the left maxillary sinus. No
air-fluid levels are seen. The mastoid air cells appear clear.

Soft tissues: Negative.

CT CERVICAL SPINE FINDINGS

Alignment: There is trace anterolisthesis at C4-C5 which is favored
is degenerative. Alignment is otherwise anatomic.

Skull base and vertebrae: No acute fracture. No primary bone lesion
or focal pathologic process.

Soft tissues and spinal canal: No prevertebral fluid or swelling. No
visible canal hematoma.

Disc levels: There is disc space narrowing throughout the cervical
spine most significant at C3-C4, C5-C6 and C6-C7 compatible with
degenerative change. Facet arthropathy is seen bilaterally. There is
no severe central canal or neural foraminal stenosis.

Upper chest: Negative.

Other: None
IMPRESSION: 1. No acute intracranial process.
2. No acute fracture or traumatic subluxation of cervical spine.
3. No acute facial fracture.
4. Mild progression of chronic small vessel ischemic change in the
brain.
5. Old left orbital wall fractures.

## 2023-07-27 IMAGING — DX DG CHEST 1V PORT
1 series · 1 of 1 positions shown · non-contrast
Comparison: Chest x-ray 02/03/2021.

CLINICAL DATA: 89-year-old male with history of fall. Rib
fractures.

EXAM:
PORTABLE CHEST 1 VIEW

[chest ap]
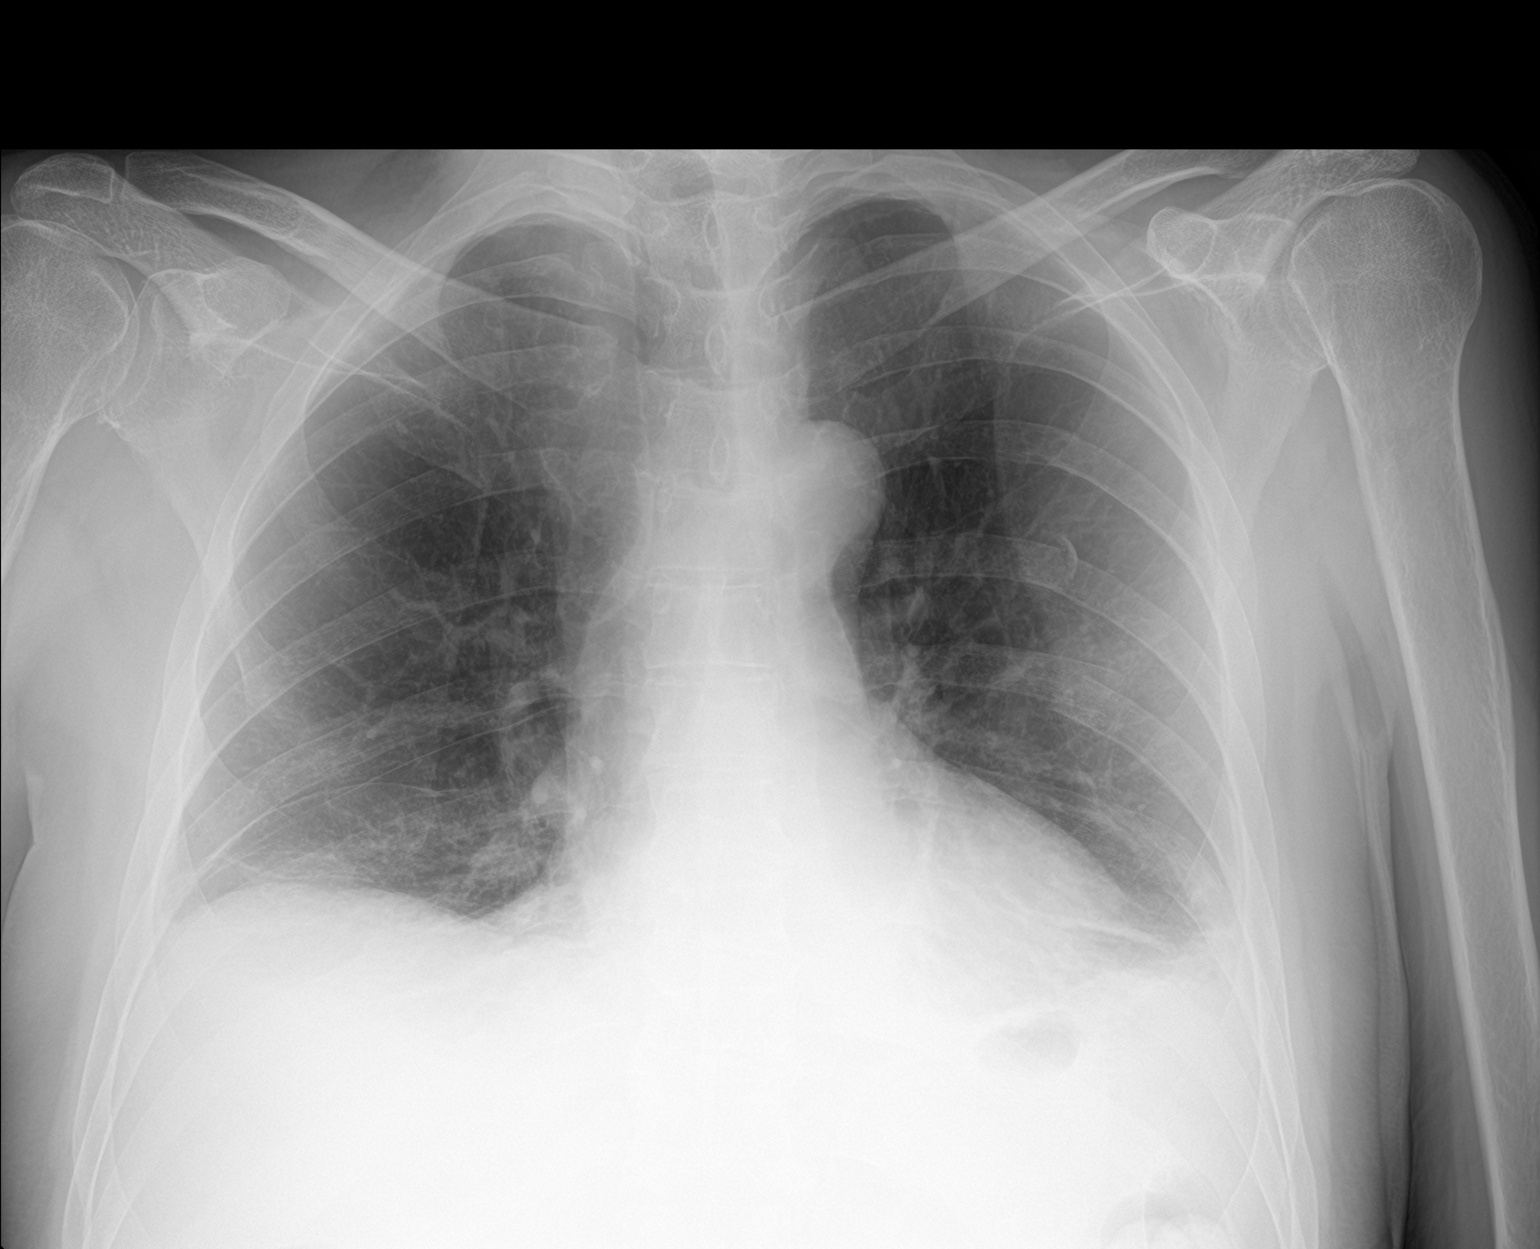

[1 of 1 positions shown; findings below may reference images not displayed]

FINDINGS: Lung volumes are low. Bibasilar opacities (left greater than right),
favored to reflect areas of subsegmental atelectasis. Trace left
pleural effusion. No definite right pleural effusion. No
consolidative airspace disease. No pleural effusions. No
pneumothorax confidently identified. No pulmonary nodule or mass
noted. Pulmonary vasculature and the cardiomediastinal silhouette
are within normal limits. Multiple posterior left-sided rib
fractures are again noted (left third through eighth), better
demonstrated on prior chest CT 02/03/2021.
IMPRESSION: 1. Multiple posterior left-sided rib fractures with low lung volumes
and bibasilar areas of subsegmental atelectasis.
2. Small left pleural effusion.
3. No definite pneumothorax.

## 2023-08-08 ENCOUNTER — Other Ambulatory Visit (HOSPITAL_COMMUNITY): Payer: Self-pay

## 2023-08-08 ENCOUNTER — Other Ambulatory Visit: Payer: Self-pay

## 2023-08-09 ENCOUNTER — Other Ambulatory Visit: Payer: Self-pay | Admitting: Internal Medicine

## 2023-08-09 ENCOUNTER — Other Ambulatory Visit (HOSPITAL_COMMUNITY): Payer: Self-pay

## 2023-08-09 MED ORDER — PRAVASTATIN SODIUM 40 MG PO TABS
40.0000 mg | ORAL_TABLET | Freq: Every day | ORAL | 1 refills | Status: AC
  Filled 2023-08-09: qty 90, 90d supply, fill #0

## 2023-08-11 ENCOUNTER — Other Ambulatory Visit (HOSPITAL_COMMUNITY): Payer: Self-pay

## 2023-08-22 ENCOUNTER — Emergency Department (HOSPITAL_BASED_OUTPATIENT_CLINIC_OR_DEPARTMENT_OTHER)
Admission: EM | Admit: 2023-08-22 | Discharge: 2023-08-22 | Disposition: A | Discharge status: Home / Self Care | Attending: Emergency Medicine | Admitting: Emergency Medicine

## 2023-08-22 ENCOUNTER — Emergency Department (HOSPITAL_BASED_OUTPATIENT_CLINIC_OR_DEPARTMENT_OTHER)

## 2023-08-22 ENCOUNTER — Encounter (HOSPITAL_BASED_OUTPATIENT_CLINIC_OR_DEPARTMENT_OTHER): Payer: Self-pay

## 2023-08-22 ENCOUNTER — Other Ambulatory Visit: Payer: Self-pay

## 2023-08-22 DIAGNOSIS — Z7901 Long term (current) use of anticoagulants: Secondary | ICD-10-CM | POA: Insufficient documentation

## 2023-08-22 DIAGNOSIS — M7989 Other specified soft tissue disorders: Secondary | ICD-10-CM | POA: Diagnosis not present

## 2023-08-22 DIAGNOSIS — S6992XA Unspecified injury of left wrist, hand and finger(s), initial encounter: Secondary | ICD-10-CM | POA: Diagnosis present

## 2023-08-22 DIAGNOSIS — W08XXXA Fall from other furniture, initial encounter: Secondary | ICD-10-CM | POA: Insufficient documentation

## 2023-08-22 DIAGNOSIS — S63259A Unspecified dislocation of unspecified finger, initial encounter: Secondary | ICD-10-CM

## 2023-08-22 DIAGNOSIS — S63285A Dislocation of proximal interphalangeal joint of left ring finger, initial encounter: Secondary | ICD-10-CM | POA: Diagnosis not present

## 2023-08-22 MED ORDER — BUPIVACAINE HCL (PF) 0.5 % IJ SOLN
10.0000 mL | Freq: Once | INTRAMUSCULAR | Status: AC
Start: 2023-08-22 — End: 2023-08-22
  Administered 2023-08-22: 10 mL
  Filled 2023-08-22: qty 10

## 2023-08-22 NOTE — ED Notes (Signed)
 Discharge instructions reviewed with patient. Patient verbalizes understanding, no further questions at this time. Medications and follow up information provided. No acute distress noted at time of departure.

## 2023-08-22 NOTE — Discharge Instructions (Signed)
 Please see your primary care doctor in 1 week to determine if it is safe to take off the splint and use your finger normally.  You may also consider following up with the orthopedic hand referral given to you.  Call for an appointment.  Your x-ray today showed a joint dislocation.  This was reduced.  No evidence of fracture.  You may have some numbness or tingling in your fingers for up to 8 hours.  This will go away during the course of the afternoon.

## 2023-08-22 NOTE — ED Triage Notes (Signed)
 Went to sit on the couch yesterday and missed the couch and fell and injured his left ring finger. Dislocation noted to finger, wedding band in place.

## 2023-08-22 NOTE — ED Provider Notes (Signed)
 [place] [facility] AT [facility] Provider Note   CSN: [id number] Arrival date & time: 08/22/23  6295     History  Chief Complaint  Patient presents with   Finger Injury    Brandon Burke is a 89 y.o. male.  Patient presents to the emergency department for evaluation of left ring finger injur occurring yesterday morning, now approximately 24 hours ago.  Patient fell yesterday.  Patient's family member at bedside states that he was sitting down on the couch and missed, falling onto the floor.  He used his left hand to catch himself.  Patient sustained a finger injury.  There was swelling.  Patient with wedding ring still in place.       Home Medications Prior to Admission medications   Medication Sig Start Date End Date Taking? Authorizing Provider  acetaminophen  (TYLENOL ) 325 MG tablet Take 2 tablets (650 mg total) by mouth every 6 (six) hours as needed for mild pain. 02/25/21   Angiulli, Everlyn Hockey, PA-C  amitriptyline  (ELAVIL ) 10 MG tablet Take 1 tablet (10 mg total) by mouth at bedtime. 04/21/23   Ezell Hollow, MD  apixaban  (ELIQUIS ) 2.5 MG TABS tablet Take 1 tablet (2.5 mg total) by mouth 2 (two) times daily. 05/25/23   Ezell Hollow, MD  ARTIFICIAL TEAR OP Place 1 drop into both eyes as needed (dry eyes).    [provider]  atenolol  (TENORMIN ) 25 MG tablet Take 1 tablet (25 mg total) by mouth daily. 07/22/23   Paz, Jose E, MD  azelastine  (ASTELIN ) 0.1 % nasal spray Place 2 sprays into both nostrils 2 (two) times daily. Use in each nostril as directed    [provider]  calcium  carbonate (TUMS - DOSED IN MG ELEMENTAL CALCIUM ) 500 MG chewable tablet Chew 1-2 tablets by mouth as needed for indigestion or heartburn.    [provider]  Cholecalciferol (VITAMIN D3) 25 MCG (1000 UT) CAPS Take 1 capsule (1,000 Units total) by mouth daily. 02/25/21   Angiulli, Everlyn Hockey, PA-C  docusate sodium  (COLACE) 100 MG capsule Take 1 capsule (100  mg total) by mouth 2 (two) times daily. 02/25/21   Angiulli, Everlyn Hockey, PA-C  donepezil  (ARICEPT ) 10 MG tablet Take 1 tablet (10 mg total) by mouth at bedtime. 05/25/23   Paz, Jose E, MD  dorzolamide -timolol  (COSOPT ) 2-0.5 % ophthalmic solution Place 1 drop into both eyes 2 (two) times daily. 10/28/22     escitalopram  (LEXAPRO ) 10 MG tablet Take 2 tablets (20 mg total) by mouth daily. 05/25/23   Paz, Jose E, MD  fluorouracil (EFUDEX) 5 % cream Apply topically 2 (two) times daily. Use as directed on scalp    [provider]  fluticasone  (FLONASE ) 50 MCG/ACT nasal spray Place 2 sprays into both nostrils daily as needed for allergies or rhinitis. 12/02/22   Paz, Jose E, MD  latanoprost  (XALATAN ) 0.005 % ophthalmic solution Place 1 drop into both eyes every evening. 10/28/22     latanoprost  (XALATAN ) 0.005 % ophthalmic solution Place 1 drop into both eyes at bedtime. 06/24/23     levothyroxine  (SYNTHROID ) 50 MCG tablet Take 1 tablet (50 mcg total) by mouth daily before breakfast. 05/25/23   Paz, Jose E, MD  MELATONIN PO Take 4 mg by mouth at bedtime.    [provider]  Multiple Vitamin (MULTIVITAMIN) tablet Take 1 tablet by mouth daily. Takes at lunch    [provider]  polyethylene glycol (MIRALAX  / GLYCOLAX ) 17 g  packet Take 17 g by mouth daily. 02/25/21   Angiulli, Everlyn Hockey, PA-C  pravastatin  (PRAVACHOL ) 40 MG tablet Take 1 tablet (40 mg total) by mouth at bedtime. 08/09/23   Ezell Hollow, MD      Allergies    Sulfonamide derivatives and Nitrofuran derivatives    Review of Systems   Review of Systems  Physical Exam Updated Vital Signs BP (!) 162/53 (BP Location: Right Arm)   Pulse (!) 51   Temp 97.6 F (36.4 C) (Oral)   Resp 16   Ht 6' (1.829 m)   Wt 73.1 kg   SpO2 100%   BMI 21.86 kg/m    Physical Exam Vitals and nursing note reviewed.  Constitutional:      Appearance: He is well-developed.  HENT:     Head: Normocephalic and atraumatic.  Eyes:      Conjunctiva/sclera: Conjunctivae normal.  Pulmonary:     Effort: No respiratory distress.  Musculoskeletal:     Cervical back: Normal range of motion and neck supple.     Comments: Left hand: Deformity of the finger noted, appears to be at the proximal interphalangeal joint.  There is swelling between the MCP and the PIP.  There is a wedding ring in place.  This cannot be left over the swelling, however it is freely mobile at the base of the finger.  I am able to slide it slightly up and down the finger and turn it without difficulty.  Skin:    General: Skin is warm and dry.  Neurological:     Mental Status: He is alert.     ED Results / Procedures / Treatments   Labs (all labs ordered are listed, but only abnormal results are displayed) Labs Reviewed - No data to display  EKG None  Radiology No results found.  Procedures .Reduction of dislocation  Date/Time: 08/22/2023 11:07 AM  Performed by: Lyna Sandhoff, PA-C Authorized by: Lyna Sandhoff, PA-C  Consent: Verbal consent obtained. Consent given by: patient Patient identity confirmed: verbally with patient and provided demographic data Local anesthesia used: yes Anesthesia: digital block  Anesthesia: Local anesthesia used: yes Local Anesthetic: bupivacaine  0.5% without epinephrine  Anesthetic total: 3 mL  Sedation: Patient sedated: no  Patient tolerance: patient tolerated the procedure well with no immediate complications Comments: Finger reduced with traction.  Joint felt to reduce.  Afterwards, 2-0 silk suture wrapped around the finger in order to incrementally remove ring.  This was done without any complications.  Patient tolerated well.       Medications Ordered in ED Medications  bupivacaine (PF) (MARCAINE ) 0.5 % injection 10 mL (10 mLs Infiltration Given by Other 08/22/23 1004)    ED Course/ Medical Decision Making/ A&P    Patient seen and examined. History obtained directly from patient and family  member at bedside.  Patient does have a ring in place.  There is associated swelling, however no disruption in blood flow suspected on initial evaluation.  For this reason, will obtain x-ray to determine if reduction will be needed.  Labs/EKG: None ordered  Imaging: Ordered x-ray of the finger.  X-ray personally reviewed and interpreted, shows PIP dislocation.  Medications/Fluids: Ordered: Bupivacaine  0.5% without epinephrine   Most recent vital signs reviewed and are as follows: BP (!) 162/53 (BP Location: Right Arm)   Pulse (!) 51   Temp 97.6 F (36.4 C) (Oral)   Resp 16   Ht 6' (1.829 m)   Wt 73.1 kg   SpO2 100%   BMI  21.86 kg/m   Initial impression: Finger injury, dislocation versus fracture, ring in place without acute vascular compromise.  10:31 AM Digital block was performed.  Care taken not to exacerbate the swelling with administration of bupivacaine .  3 sided ring block was performed.  Capillary refill remained less than 2 seconds after each injection.  I also massaged the bupivacaine  into the tissues.  Finger appears unchanged.  Patient tolerated well.  11:08 AM reduction performed per note.  I used 2-0 silk suture and wrapped this around the finger to incrementally remove the ring.  This was done without any problems.  Ring was placed into a biohazard bag, sealed and given to the patient's wife at bedside.  Splint ordered. Postreduction film reviewed. Joint appears reduced. No fracture.   11:26 AM Reassessment performed. Patient appears stable, comfortable.  Finger splint in place.  Reviewed pertinent lab work and imaging with patient at bedside. Questions answered.   Most current vital signs reviewed and are as follows: BP (!) 162/53 (BP Location: Right Arm)   Pulse (!) 51   Temp 97.6 F (36.4 C) (Oral)   Resp 16   Ht 6' (1.829 m)   Wt 73.1 kg   SpO2 100%   BMI 21.86 kg/m   Plan: Discharge to home.  Encouraged to follow-up with PCP in about a week to have the  finger rechecked.  Encouraged to use splint until cleared.  Also given orthopedic hand surgery follow-up if desired.  Postreduction films appear to show joint that is reduced, no evidence of fracture.  There is a long delay in radiology reads today.  Patient will be discharged prior to these resulting.  I will contact patient with any results that require a change in plan.  Prescriptions written for: None  Other home care instructions discussed: Use of splint, rest, ice, elevation.  ED return instructions discussed: Return with any concerns  Follow-up instructions discussed: Patient encouraged to follow-up with their PCP in 7 days.                                  Medical Decision Making Amount and/or Complexity of Data Reviewed Radiology: ordered.  Risk Prescription drug management.   Patient with finger joint dislocation, left ring finger.  Occurred 24 hours prior to presentation.  There was some swelling and deformity.  Ring was in place but does not appear to be causing arterial compromise.  Finger was reduced after ring block.  Ring was removed.  Patient doing well.  Follow-up plan as above.        Final Clinical Impression(s) / ED Diagnoses Final diagnoses:  Finger dislocation, initial encounter    Rx / DC Orders ED Discharge Orders     None         Lyna Sandhoff, PA-C 08/22/23 1128    Ninetta Basket, MD 08/28/23 [phone]

## 2023-08-23 ENCOUNTER — Telehealth: Payer: Self-pay

## 2023-08-23 NOTE — Transitions of Care (Post Inpatient/ED Visit) (Signed)
   08/23/2023  Name: Mahki Spikes [facility] MRN: [id number] DOB: [birth year]  Today's TOC FU Call Status: Today's TOC FU Call Status:: Unsuccessful Call (1st Attempt) Unsuccessful Call (1st Attempt) Date: 08/23/23  Attempted to reach the patient regarding the most recent Inpatient/ED visit.  Follow Up Plan: Additional outreach attempts will be made to reach the patient to complete the Transitions of Care (Post Inpatient/ED visit) call.   Signature  Se[place]ron [place], LPN [facility] Health Advisor [place] [facility] You Are. We Are. [facility] Direct Dial [phone]

## 2023-08-23 NOTE — Transitions of Care (Post Inpatient/ED Visit) (Signed)
 08/23/2023  Name: Brandon Burke [facility] MRN: [id number] DOB: 14-Sep-1931  Today's TOC FU Call Status: Today's TOC FU Call Status:: Successful TOC FU Call Completed Unsuccessful Call (1st Attempt) Date: 08/23/23 [facility] FU Call Complete Date: 08/23/23 Patient's Name and Date of Birth confirmed.  Transition Care Management Follow-up Telephone Call Date of Discharge: 08/22/23 Discharge Facility: [facility] Type of Discharge: Emergency Department Reason for ED Visit: Other: (Dislocation of finger) How have you been since you were released from the hospital?: Same Any questions or concerns?: No  Items Reviewed: Did you receive and understand the discharge instructions provided?: Yes Any new allergies since your discharge?: No Dietary orders reviewed?: NA Do you have support at home?: Yes People in Home [RPT]: spouse  Medications Reviewed Today: Medications Reviewed Today     Reviewed by Cathye Coca, LPN (Licensed Practical Nurse) on 08/23/23 at 1506  Med List Status: <None>   Medication Order Taking? Sig Documenting Provider Last Dose Status Informant  acetaminophen  (TYLENOL ) 325 MG tablet [id number] Yes Take 2 tablets (650 mg total) by mouth every 6 (six) hours as needed for mild pain. Sterling Eisenmenger, PA-C Taking Active   amitriptyline  (ELAVIL ) 10 MG tablet [id number] Yes Take 1 tablet (10 mg total) by mouth at bedtime. Ezell Hollow, MD Taking Active   apixaban  (ELIQUIS ) 2.5 MG TABS tablet [id number] Yes Take 1 tablet (2.5 mg total) by mouth 2 (two) times daily. Ezell Hollow, MD Taking Active   ARTIFICIAL TEAR OP [id number] Yes Place 1 drop into both eyes as needed (dry eyes). [provider] Taking Active Multiple Informants  atenolol  (TENORMIN ) 25 MG tablet [id number] Yes Take 1 tablet (25 mg total) by mouth daily. Paz, Jose E, MD Taking Active   azelastine  (ASTELIN ) 0.1 % nasal spray [id number] Yes Place 2 sprays into both nostrils 2 (two) times daily. Use in each  nostril as directed [provider] Taking Active   calcium  carbonate (TUMS - DOSED IN MG ELEMENTAL CALCIUM ) 500 MG chewable tablet [id number] Yes Chew 1-2 tablets by mouth as needed for indigestion or heartburn. [provider] Taking Active Spouse/Significant Other  Cholecalciferol (VITAMIN D3) 25 MCG (1000 UT) CAPS [id number] Yes Take 1 capsule (1,000 Units total) by mouth daily. Sterling Eisenmenger, PA-C Taking Active   docusate sodium  (COLACE) 100 MG capsule [id number] Yes Take 1 capsule (100 mg total) by mouth 2 (two) times daily. Sterling Eisenmenger, PA-C Taking Active            Med Note Alida Ion, TAMMY B   Mon Feb 01, 2022 11:01 AM) Only uses as needed  donepezil  (ARICEPT ) 10 MG tablet [id number] Yes Take 1 tablet (10 mg total) by mouth at bedtime. Paz, Jose E, MD Taking Active   dorzolamide -timolol  (COSOPT ) 2-0.5 % ophthalmic solution [id number] Yes Place 1 drop into both eyes 2 (two) times daily.  Taking Active   escitalopram  (LEXAPRO ) 10 MG tablet [id number] Yes Take 2 tablets (20 mg total) by mouth daily. Paz, Jose E, MD Taking Active   fluorouracil (EFUDEX) 5 % cream [id number] Yes Apply topically 2 (two) times daily. Use as directed on scalp [provider] Taking Active   fluticasone  (FLONASE ) 50 MCG/ACT nasal spray [id number] Yes Place 2 sprays into both nostrils daily as needed for allergies or rhinitis. Paz, Jose E, MD Taking Active   latanoprost  (XALATAN ) 0.005 % ophthalmic solution [id number] Yes Place 1 drop into both eyes every evening.  Taking Active   latanoprost  (XALATAN ) 0.005 %  ophthalmic solution [id number] Yes Place 1 drop into both eyes at bedtime.  Taking Active   levothyroxine  (SYNTHROID ) 50 MCG tablet [id number] Yes Take 1 tablet (50 mcg total) by mouth daily before breakfast. Paz, Jose E, MD Taking Active   MELATONIN PO [id number] Yes Take 4 mg by mouth at bedtime. [provider] Taking Active   Multiple Vitamin (MULTIVITAMIN) tablet [id number]  Yes Take 1 tablet by mouth daily. Takes at lunch [provider] Taking Active Multiple Informants  polyethylene glycol (MIRALAX  / GLYCOLAX ) 17 g packet [id number] Yes Take 17 g by mouth daily. Sterling Eisenmenger, PA-C Taking Active   pravastatin  (PRAVACHOL ) 40 MG tablet [id number] Yes Take 1 tablet (40 mg total) by mouth at bedtime. Ezell Hollow, MD Taking Active             Home Care and Equipment/Supplies: Were Home Health Services Ordered?: NA Any new equipment or medical supplies ordered?: Yes Were you able to get the equipment/medical supplies?: Yes Do you have any questions related to the use of the equipment/supplies?: No  Functional Questionnaire: Do you need assistance with bathing/showering or dressing?: No Do you need assistance with meal preparation?: Yes Do you need assistance with eating?: No Do you need assistance with getting out of bed/getting out of a chair/moving?: No Do you have difficulty managing or taking your medications?: Yes  Follow up appointments reviewed: PCP Follow-up appointment confirmed?: Yes Date of PCP follow-up appointment?: 08/30/23 Follow-up Provider: Dr. Neomi Banks [facility] Follow-up appointment confirmed?: NA Do you need transportation to your follow-up appointment?: No Do you understand care options if your condition(s) worsen?: Yes-patient verbalized understanding    SIGNATURE Seabron Cypress, LPN [facility] Health Advisor [place] [facility] You Are. We Are. [facility] Direct Dial [phone]

## 2023-08-30 ENCOUNTER — Encounter: Payer: Self-pay | Admitting: Internal Medicine

## 2023-08-30 ENCOUNTER — Inpatient Hospital Stay: Admitting: Internal Medicine

## 2023-08-30 ENCOUNTER — Ambulatory Visit (INDEPENDENT_AMBULATORY_CARE_PROVIDER_SITE_OTHER): Admitting: Internal Medicine

## 2023-08-30 VITALS — BP 126/72 | HR 60 | Temp 97.6°F | Resp 16 | Ht 72.0 in | Wt 163.5 lb

## 2023-08-30 DIAGNOSIS — W19XXXD Unspecified fall, subsequent encounter: Secondary | ICD-10-CM | POA: Diagnosis not present

## 2023-08-30 DIAGNOSIS — S63259D Unspecified dislocation of unspecified finger, subsequent encounter: Secondary | ICD-10-CM

## 2023-08-30 DIAGNOSIS — Z789 Other specified health status: Secondary | ICD-10-CM

## 2023-08-30 NOTE — Progress Notes (Unsigned)
 Subjective:    Patient ID: Brandon Burke, male    DOB: 09-03-1931, 89 y.o.   MRN: [id number]  DOS:  08/30/2023 Type of visit - description: ED follow-up ED 08/22/2023: Had a mechanical fall, dislocated his L ring finger. No other injuries.  The fall was mechanical, no syncope. Ring was removed and finger relocated.  Follow-up x-ray confirm postreduction of the proximal interphalangeal joint  Since then, he has been using a splint.  He has mild pain.  No swelling. At  the ER, BP was slightly elevated at 162/53.  Review of Systems See above   Past Medical History:  Diagnosis Date   Constipation    DVT (deep venous thrombosis) (HCC)    hx of 2001 (after prostate surgery)     Glaucoma    Hyperlipidemia    Hypertension    Melanoma (HCC)    face, hand   Mitral valve prolapse    OSA (obstructive sleep apnea) 11/26/2020   PE (pulmonary embolism)    5-09:was referred to hematology, and they recommend to discontinue Coumadin 5/11   Prostate cancer (HCC)     released from urology in 2010,needs yearly PSAs with PCP   Seasonal allergies     Past Surgical History:  Procedure Laterality Date   CATARACT EXTRACTION Bilateral    EYE SURGERY Bilateral    Cat Sx   PARS PLANA VITRECTOMY Left 09/06/2019   Procedure: PARS PLANA VITRECTOMY WITH 25 GAUGE WITH INTRAVITREAL ANTIBIOTICS;  Surgeon: Ronelle Coffee, MD;  Location: [facility] OR;  Service: Ophthalmology;  Laterality: Left;   PHOTOCOAGULATION WITH LASER Left 09/06/2019   Procedure: Photocoagulation With Laser;  Surgeon: Ronelle Coffee, MD;  Location: [facility] OR;  Service: Ophthalmology;  Laterality: Left;   PROSTATECTOMY  2000   RUPTURED GLOBE EXPLORATION AND REPAIR Left 08/16/2019   Procedure: OPEN GLOBE EXPLORATION EYE POSSIBLE ANTERIOR  CHAMBER WASH OUT;  Surgeon: Aminta Kales, MD;  Location: [facility] OR;  Service: Ophthalmology;  Laterality: Left;    Current Outpatient Medications  Medication Instructions   acetaminophen  (TYLENOL ) 650 mg,  Oral, Every 6 hours PRN   amitriptyline  (ELAVIL ) 10 mg, Oral, Daily at bedtime   ARTIFICIAL TEAR OP 1 drop, As needed   atenolol  (TENORMIN ) 25 mg, Oral, Daily   azelastine  (ASTELIN ) 0.1 % nasal spray 2 sprays, 2 times daily   calcium  carbonate (TUMS - DOSED IN MG ELEMENTAL CALCIUM ) 500 MG chewable tablet 1-2 tablets, As needed   docusate sodium  (COLACE) 100 mg, Oral, 2 times daily   donepezil  (ARICEPT ) 10 mg, Oral, Daily at bedtime   dorzolamide -timolol  (COSOPT ) 2-0.5 % ophthalmic solution 1 drop, Both Eyes, 2 times daily   Eliquis  2.5 mg, Oral, 2 times daily   escitalopram  (LEXAPRO ) 20 mg, Oral, Daily   fluorouracil (EFUDEX) 5 % cream 2 times daily   fluticasone  (FLONASE ) 50 MCG/ACT nasal spray 2 sprays, Each Nare, Daily PRN   latanoprost  (XALATAN ) 0.005 % ophthalmic solution 1 drop, Both Eyes, Daily at bedtime   levothyroxine  (SYNTHROID ) 50 mcg, Oral, Daily before breakfast   MELATONIN PO 4 mg, Daily at bedtime   Multiple Vitamin (MULTIVITAMIN) tablet 1 tablet, Daily   polyethylene glycol (MIRALAX  / GLYCOLAX ) 17 g, Oral, Daily   pravastatin  (PRAVACHOL ) 40 mg, Oral, Daily at bedtime   Vitamin D3 1,000 Units, Oral, Daily       Objective:   Physical Exam BP 126/72   Pulse 60   Temp 97.6 F (36.4 C) (Oral)   Resp 16  Ht 6' (1.829 m)   Wt 163 lb 8 oz (74.2 kg)   SpO2 95%   BMI 22.17 kg/m  General:   Well developed, NAD, BMI noted. HEENT:  Normocephalic . Face symmetric, atraumatic L hand: Fourth finger: No edema, good capillary refill.  ROM of the proximal interphalangeal decreased.  Slightly TTP there.  Skin: Not pale. Not jaundice Neurologic:  alert & oriented X3.  Speech normal, gait appropriate for age and unassisted Psych--  Cognition and judgment appear intact.  Cooperative with normal attention span and concentration.  Behavior appropriate. No anxious or depressed appearing.      Assessment     Assessment Hyperlipidemia Hypothyroidism, diagnosed  08-2020. Depression: On Lexapro , intolerant to Wellbutrin  6 12-2021 OSA Dx 09/01/2020.unable to use a CPAP Prostate cancer, released from urology 2010, Rx yearly PSAs by PCP [facility],  h/o palpitations: on chronic atenolol    HEM: DVT 2001 after surgery; PE 2009, eval by hematology, they recommend DC Coumadin 2011.  Spontaneous right leg DVT 11/2019, anticoag x life (dose decreased to Eliquis  2.5 twice daily on 04/2020) H/o MVP-- on BB glaucoma Sees dermatology q 6 months  Facial FX 08-2019, lost L eye vision 01-2021: Fall, TBI, facial lacerations, multiple rib fractures, Dementia-- MCI started after TBI  PLAN Dislocation, left finger: Injury happened at 08/22/2023, he seems to be doing well with the splint, ok to remove with intermittently if needed but recommend to remove it completely in 1 week from now. Office visit in 2 to 3 weeks for reassessment

## 2023-08-30 NOTE — Patient Instructions (Signed)
Vaccines I recommend:  Covid booster

## 2023-08-31 NOTE — Assessment & Plan Note (Signed)
 Dislocation, left finger: Injury happened at 08/22/2023, he seems to be doing well with the splint, ok to remove with intermittently if needed but recommend to remove it completely in 1 week from now. Office visit in 2 to 3 weeks for reassessment

## 2023-09-06 ENCOUNTER — Other Ambulatory Visit (HOSPITAL_COMMUNITY): Payer: Self-pay

## 2023-09-20 ENCOUNTER — Ambulatory Visit: Admitting: Internal Medicine

## 2023-09-27 ENCOUNTER — Encounter: Payer: Self-pay | Admitting: Internal Medicine

## 2023-09-27 ENCOUNTER — Ambulatory Visit (INDEPENDENT_AMBULATORY_CARE_PROVIDER_SITE_OTHER): Admitting: Internal Medicine

## 2023-09-27 VITALS — BP 128/78 | HR 58 | Temp 98.0°F | Resp 16 | Ht 72.0 in | Wt 144.4 lb

## 2023-09-27 DIAGNOSIS — S63259S Unspecified dislocation of unspecified finger, sequela: Secondary | ICD-10-CM

## 2023-09-27 DIAGNOSIS — R5382 Chronic fatigue, unspecified: Secondary | ICD-10-CM

## 2023-09-27 DIAGNOSIS — E039 Hypothyroidism, unspecified: Secondary | ICD-10-CM | POA: Diagnosis not present

## 2023-09-27 DIAGNOSIS — W19XXXS Unspecified fall, sequela: Secondary | ICD-10-CM | POA: Diagnosis not present

## 2023-09-27 NOTE — Assessment & Plan Note (Signed)
 Dislocation, left finger: See last office visit, seems to be recuperating well.  No further evaluation is needed. Fatigue: Chronic, at baseline Hypothyroidism: Last TSH satisfactory, recheck on RTC RTC 2 to 3 months

## 2023-09-27 NOTE — Progress Notes (Signed)
 Subjective:    Patient ID: [facility], male    DOB: June 27, 1931, 89 y.o.   MRN: [id number]  DOS:  09/27/2023 Type of visit - description: Follow-up, here with his wife  Follow-up, mostly to check his finger, he had a dislocation. Other than that he feels ok.  Fatigue is at baseline. Denies nausea or vomiting.  No blood in the stools. No chest pain or difficulty breathing w/  normal activities.   Review of Systems See above   Past Medical History:  Diagnosis Date   Constipation    DVT (deep venous thrombosis) (HCC)    hx of 2001 (after prostate surgery)     Glaucoma    Hyperlipidemia    Hypertension    Melanoma (HCC)    face, hand   Mitral valve prolapse    OSA (obstructive sleep apnea) 11/26/2020   PE (pulmonary embolism)    5-09:was referred to hematology, and they recommend to discontinue Coumadin 5/11   Prostate cancer (HCC)     released from urology in 2010,needs yearly PSAs with PCP   Seasonal allergies     Past Surgical History:  Procedure Laterality Date   CATARACT EXTRACTION Bilateral    EYE SURGERY Bilateral    Cat Sx   PARS PLANA VITRECTOMY Left 09/06/2019   Procedure: PARS PLANA VITRECTOMY WITH 25 GAUGE WITH INTRAVITREAL ANTIBIOTICS;  Surgeon: Ronelle Coffee, MD;  Location: [facility] OR;  Service: Ophthalmology;  Laterality: Left;   PHOTOCOAGULATION WITH LASER Left 09/06/2019   Procedure: Photocoagulation With Laser;  Surgeon: Ronelle Coffee, MD;  Location: [facility] OR;  Service: Ophthalmology;  Laterality: Left;   PROSTATECTOMY  2000   RUPTURED GLOBE EXPLORATION AND REPAIR Left 08/16/2019   Procedure: OPEN GLOBE EXPLORATION EYE POSSIBLE ANTERIOR  CHAMBER WASH OUT;  Surgeon: Aminta Kales, MD;  Location: [facility] OR;  Service: Ophthalmology;  Laterality: Left;    Current Outpatient Medications  Medication Instructions   acetaminophen  (TYLENOL ) 650 mg, Oral, Every 6 hours PRN   amitriptyline  (ELAVIL ) 10 mg, Oral, Daily at bedtime   ARTIFICIAL TEAR OP 1 drop, As  needed   atenolol  (TENORMIN ) 25 mg, Oral, Daily   azelastine  (ASTELIN ) 0.1 % nasal spray 2 sprays, 2 times daily   calcium  carbonate (TUMS - DOSED IN MG ELEMENTAL CALCIUM ) 500 MG chewable tablet 1-2 tablets, As needed   docusate sodium  (COLACE) 100 mg, Oral, 2 times daily   donepezil  (ARICEPT ) 10 mg, Oral, Daily at bedtime   dorzolamide -timolol  (COSOPT ) 2-0.5 % ophthalmic solution 1 drop, Both Eyes, 2 times daily   Eliquis  2.5 mg, Oral, 2 times daily   escitalopram  (LEXAPRO ) 20 mg, Oral, Daily   fluorouracil (EFUDEX) 5 % cream 2 times daily   fluticasone  (FLONASE ) 50 MCG/ACT nasal spray 2 sprays, Each Nare, Daily PRN   latanoprost  (XALATAN ) 0.005 % ophthalmic solution 1 drop, Both Eyes, Daily at bedtime   levothyroxine  (SYNTHROID ) 50 mcg, Oral, Daily before breakfast   MELATONIN PO 4 mg, Daily at bedtime   Multiple Vitamin (MULTIVITAMIN) tablet 1 tablet, Daily   polyethylene glycol (MIRALAX  / GLYCOLAX ) 17 g, Oral, Daily   pravastatin  (PRAVACHOL ) 40 mg, Oral, Daily at bedtime   Vitamin D3 1,000 Units, Oral, Daily       Objective:   Physical Exam BP 128/78   Pulse (!) 58   Temp 98 F (36.7 C) (Oral)   Resp 16   Ht 6' (1.829 m)   Wt 144 lb 6 oz (65.5 kg)   SpO2 95%  BMI 19.58 kg/m  General:   Well developed, chronically ill appearing.   HEENT:  Normocephalic . Face symmetric, atraumatic Lungs:  CTA B Normal respiratory effort, no intercostal retractions, no accessory muscle use. Heart: RRR,  no murmur.  L fourth finger: Slightly swollen around the PIP, range of motion only slightly decreased.  Skin: Not pale. Not jaundice Neurologic:  alert & oriented X3.  Speech normal, gait assisted by cane. Psych--  Cognition and judgment appear intact.  Cooperative with normal attention span and concentration.  Behavior appropriate. Seems to be in good spirits     Assessment     Assessment Hyperlipidemia Hypothyroidism, diagnosed 08-2020. Depression: On Lexapro , intolerant  to Wellbutrin  6 12-2021 OSA Dx 09/01/2020.unable to use a CPAP Prostate cancer, released from urology 2010, Rx yearly PSAs by PCP MVP,  h/o palpitations: on chronic atenolol    HEM: DVT 2001 after surgery; PE 2009, eval by hematology, they recommend DC Coumadin 2011.  Spontaneous right leg DVT 11/2019, anticoag x life (dose decreased to Eliquis  2.5 twice daily on 04/2020) H/o MVP-- on BB glaucoma Sees dermatology q 6 months  Facial FX 08-2019, lost L eye vision 01-2021: Fall, TBI, facial lacerations, multiple rib fractures, Dementia-- MCI started after TBI  PLAN Dislocation, left finger: See last office visit, seems to be recuperating well.  No further evaluation is needed. Fatigue: Chronic, at baseline Hypothyroidism: Last TSH satisfactory, recheck on RTC RTC 2 to 3 months

## 2023-09-27 NOTE — Patient Instructions (Addendum)
   Next office visit for a check up in 2 to 3 months . Sooner if needed  Please make an appointment before you leave today

## 2023-09-30 DIAGNOSIS — H401111 Primary open-angle glaucoma, right eye, mild stage: Secondary | ICD-10-CM | POA: Diagnosis not present

## 2023-09-30 DIAGNOSIS — H02423 Myogenic ptosis of bilateral eyelids: Secondary | ICD-10-CM | POA: Diagnosis not present

## 2023-09-30 DIAGNOSIS — H4032X3 Glaucoma secondary to eye trauma, left eye, severe stage: Secondary | ICD-10-CM | POA: Diagnosis not present

## 2023-09-30 DIAGNOSIS — H16212 Exposure keratoconjunctivitis, left eye: Secondary | ICD-10-CM | POA: Diagnosis not present

## 2023-10-03 ENCOUNTER — Other Ambulatory Visit (HOSPITAL_COMMUNITY): Payer: Self-pay

## 2023-10-11 ENCOUNTER — Ambulatory Visit: Payer: PPO | Admitting: Internal Medicine

## 2023-10-12 ENCOUNTER — Other Ambulatory Visit (HOSPITAL_COMMUNITY): Payer: Self-pay

## 2023-10-12 MED ORDER — DORZOLAMIDE HCL-TIMOLOL MAL 2-0.5 % OP SOLN
1.0000 [drp] | Freq: Two times a day (BID) | OPHTHALMIC | 2 refills | Status: AC
  Filled 2023-10-12: qty 20, 80d supply, fill #0

## 2023-10-31 NOTE — Telephone Encounter (Signed)
 error

## 2023-11-07 ENCOUNTER — Telehealth: Payer: Self-pay

## 2023-11-07 DIAGNOSIS — Z111 Encounter for screening for respiratory tuberculosis: Secondary | ICD-10-CM | POA: Diagnosis not present

## 2023-11-07 NOTE — Telephone Encounter (Signed)
 FL2 forms received from [facility]. Pt's wife has been made comfort care w/ Hospice and family is trying to move Pt in ASAP. Move in date is anticipated on 11/14/23. Forms completed and placed in PCP red folder to be signed.

## 2023-11-08 DIAGNOSIS — Z0279 Encounter for issue of other medical certificate: Secondary | ICD-10-CM

## 2023-11-08 NOTE — Telephone Encounter (Signed)
 Form faxed

## 2023-11-08 NOTE — Telephone Encounter (Signed)
Forms signed, please fax.

## 2023-11-09 DIAGNOSIS — Z111 Encounter for screening for respiratory tuberculosis: Secondary | ICD-10-CM | POA: Diagnosis not present

## 2023-11-09 NOTE — Telephone Encounter (Signed)
 Form was faxed back to [facility] at [phone]. Form sent for scanning.

## 2023-11-14 DIAGNOSIS — E039 Hypothyroidism, unspecified: Secondary | ICD-10-CM | POA: Diagnosis not present

## 2023-11-14 DIAGNOSIS — F0150 Vascular dementia without behavioral disturbance: Secondary | ICD-10-CM | POA: Diagnosis not present

## 2023-11-14 DIAGNOSIS — F411 Generalized anxiety disorder: Secondary | ICD-10-CM | POA: Diagnosis not present

## 2023-11-14 DIAGNOSIS — K5900 Constipation, unspecified: Secondary | ICD-10-CM | POA: Diagnosis not present

## 2023-11-14 DIAGNOSIS — E559 Vitamin D deficiency, unspecified: Secondary | ICD-10-CM | POA: Diagnosis not present

## 2023-11-14 DIAGNOSIS — J309 Allergic rhinitis, unspecified: Secondary | ICD-10-CM | POA: Diagnosis not present

## 2023-11-14 DIAGNOSIS — G4733 Obstructive sleep apnea (adult) (pediatric): Secondary | ICD-10-CM | POA: Diagnosis not present

## 2023-11-14 DIAGNOSIS — F331 Major depressive disorder, recurrent, moderate: Secondary | ICD-10-CM | POA: Diagnosis not present

## 2023-11-14 DIAGNOSIS — Z7901 Long term (current) use of anticoagulants: Secondary | ICD-10-CM | POA: Diagnosis not present

## 2023-11-14 DIAGNOSIS — E782 Mixed hyperlipidemia: Secondary | ICD-10-CM | POA: Diagnosis not present

## 2023-11-14 DIAGNOSIS — Z87820 Personal history of traumatic brain injury: Secondary | ICD-10-CM | POA: Diagnosis not present

## 2023-11-14 DIAGNOSIS — H409 Unspecified glaucoma: Secondary | ICD-10-CM | POA: Diagnosis not present

## 2023-11-16 ENCOUNTER — Telehealth: Payer: Self-pay | Admitting: Internal Medicine

## 2023-11-16 NOTE — Telephone Encounter (Signed)
 Spoke w/ [name], informed that letter is ready for pick up at front desk.

## 2023-11-16 NOTE — Telephone Encounter (Signed)
 Okay to write a letter. I will be happy to talk with the patient anytime, in fact I left a message for him several days ago.  To whom it may concern I know  Br[facility] for many years. He has a number of medical problems including dementia. At this point, I do not think he is able to handle his own affairs due to above.

## 2023-11-16 NOTE — Telephone Encounter (Signed)
 Copied from CRM [id number]. Topic: General - Other >> Nov 16, 2023 11:55 AM Emylou G wrote: Reason for CRM:  Reilley Latorre at [phone]. Called.. needs a letter that patient is no longer able to handle his affairs because he has dementia .SABRA Wants to know if he needs to come in or not?

## 2023-11-16 NOTE — Telephone Encounter (Signed)
**Note De-identified  [place] Obfuscation** Please advise 

## 2023-11-22 DIAGNOSIS — F0390 Unspecified dementia without behavioral disturbance: Secondary | ICD-10-CM | POA: Diagnosis not present

## 2023-11-22 DIAGNOSIS — F411 Generalized anxiety disorder: Secondary | ICD-10-CM | POA: Diagnosis not present

## 2023-11-25 DIAGNOSIS — E782 Mixed hyperlipidemia: Secondary | ICD-10-CM | POA: Diagnosis not present

## 2023-11-25 DIAGNOSIS — E559 Vitamin D deficiency, unspecified: Secondary | ICD-10-CM | POA: Diagnosis not present

## 2023-11-25 DIAGNOSIS — E039 Hypothyroidism, unspecified: Secondary | ICD-10-CM | POA: Diagnosis not present

## 2023-11-25 DIAGNOSIS — I10 Essential (primary) hypertension: Secondary | ICD-10-CM | POA: Diagnosis not present

## 2023-11-28 DIAGNOSIS — R4182 Altered mental status, unspecified: Secondary | ICD-10-CM | POA: Diagnosis not present

## 2023-11-28 DIAGNOSIS — W19XXXA Unspecified fall, initial encounter: Secondary | ICD-10-CM | POA: Diagnosis not present

## 2023-11-28 DIAGNOSIS — M6281 Muscle weakness (generalized): Secondary | ICD-10-CM | POA: Diagnosis not present

## 2023-11-29 DIAGNOSIS — N390 Urinary tract infection, site not specified: Secondary | ICD-10-CM | POA: Diagnosis not present

## 2023-11-30 DIAGNOSIS — G4700 Insomnia, unspecified: Secondary | ICD-10-CM | POA: Diagnosis not present

## 2023-11-30 DIAGNOSIS — Z9181 History of falling: Secondary | ICD-10-CM | POA: Diagnosis not present

## 2023-11-30 DIAGNOSIS — F0393 Unspecified dementia, unspecified severity, with mood disturbance: Secondary | ICD-10-CM | POA: Diagnosis not present

## 2023-11-30 DIAGNOSIS — E039 Hypothyroidism, unspecified: Secondary | ICD-10-CM | POA: Diagnosis not present

## 2023-11-30 DIAGNOSIS — F32A Depression, unspecified: Secondary | ICD-10-CM | POA: Diagnosis not present

## 2023-11-30 DIAGNOSIS — Z7901 Long term (current) use of anticoagulants: Secondary | ICD-10-CM | POA: Diagnosis not present

## 2023-11-30 DIAGNOSIS — Z87820 Personal history of traumatic brain injury: Secondary | ICD-10-CM | POA: Diagnosis not present

## 2023-11-30 DIAGNOSIS — F0392 Unspecified dementia, unspecified severity, with psychotic disturbance: Secondary | ICD-10-CM | POA: Diagnosis not present

## 2023-11-30 DIAGNOSIS — F0394 Unspecified dementia, unspecified severity, with anxiety: Secondary | ICD-10-CM | POA: Diagnosis not present

## 2023-11-30 DIAGNOSIS — E785 Hyperlipidemia, unspecified: Secondary | ICD-10-CM | POA: Diagnosis not present

## 2023-12-01 DIAGNOSIS — H409 Unspecified glaucoma: Secondary | ICD-10-CM | POA: Diagnosis not present

## 2023-12-01 DIAGNOSIS — E039 Hypothyroidism, unspecified: Secondary | ICD-10-CM | POA: Diagnosis not present

## 2023-12-09 DIAGNOSIS — H409 Unspecified glaucoma: Secondary | ICD-10-CM | POA: Diagnosis not present

## 2023-12-09 DIAGNOSIS — E039 Hypothyroidism, unspecified: Secondary | ICD-10-CM | POA: Diagnosis not present

## 2023-12-12 DIAGNOSIS — E559 Vitamin D deficiency, unspecified: Secondary | ICD-10-CM | POA: Diagnosis not present

## 2023-12-12 DIAGNOSIS — F331 Major depressive disorder, recurrent, moderate: Secondary | ICD-10-CM | POA: Diagnosis not present

## 2023-12-12 DIAGNOSIS — E782 Mixed hyperlipidemia: Secondary | ICD-10-CM | POA: Diagnosis not present

## 2023-12-30 ENCOUNTER — Ambulatory Visit: Admitting: Internal Medicine

## 2023-12-30 DIAGNOSIS — E789 Disorder of lipoprotein metabolism, unspecified: Secondary | ICD-10-CM | POA: Diagnosis not present

## 2023-12-30 DIAGNOSIS — F32A Depression, unspecified: Secondary | ICD-10-CM | POA: Diagnosis not present

## 2023-12-30 DIAGNOSIS — F0390 Unspecified dementia without behavioral disturbance: Secondary | ICD-10-CM | POA: Diagnosis not present

## 2023-12-30 DIAGNOSIS — E039 Hypothyroidism, unspecified: Secondary | ICD-10-CM | POA: Diagnosis not present

## 2024-01-16 DIAGNOSIS — E039 Hypothyroidism, unspecified: Secondary | ICD-10-CM | POA: Diagnosis not present

## 2024-01-16 DIAGNOSIS — M6281 Muscle weakness (generalized): Secondary | ICD-10-CM | POA: Diagnosis not present

## 2024-01-16 DIAGNOSIS — K5900 Constipation, unspecified: Secondary | ICD-10-CM | POA: Diagnosis not present

## 2024-01-19 DIAGNOSIS — E785 Hyperlipidemia, unspecified: Secondary | ICD-10-CM | POA: Diagnosis not present

## 2024-01-19 DIAGNOSIS — G4701 Insomnia due to medical condition: Secondary | ICD-10-CM | POA: Diagnosis not present

## 2024-01-19 DIAGNOSIS — F02818 Dementia in other diseases classified elsewhere, unspecified severity, with other behavioral disturbance: Secondary | ICD-10-CM | POA: Diagnosis not present

## 2024-01-19 DIAGNOSIS — F0283 Dementia in other diseases classified elsewhere, unspecified severity, with mood disturbance: Secondary | ICD-10-CM | POA: Diagnosis not present

## 2024-01-19 DIAGNOSIS — F0152 Vascular dementia, unspecified severity, with psychotic disturbance: Secondary | ICD-10-CM | POA: Diagnosis not present

## 2024-01-31 ENCOUNTER — Emergency Department (HOSPITAL_COMMUNITY)
Admission: EM | Admit: 2024-01-31 | Discharge: 2024-02-01 | Disposition: A | Discharge status: Home / Self Care | Attending: Emergency Medicine | Admitting: Emergency Medicine

## 2024-01-31 DIAGNOSIS — M4802 Spinal stenosis, cervical region: Secondary | ICD-10-CM | POA: Diagnosis not present

## 2024-01-31 DIAGNOSIS — M47812 Spondylosis without myelopathy or radiculopathy, cervical region: Secondary | ICD-10-CM | POA: Diagnosis not present

## 2024-01-31 DIAGNOSIS — W01198A Fall on same level from slipping, tripping and stumbling with subsequent striking against other object, initial encounter: Secondary | ICD-10-CM | POA: Insufficient documentation

## 2024-01-31 DIAGNOSIS — F0390 Unspecified dementia without behavioral disturbance: Secondary | ICD-10-CM | POA: Insufficient documentation

## 2024-01-31 DIAGNOSIS — I6782 Cerebral ischemia: Secondary | ICD-10-CM | POA: Diagnosis not present

## 2024-01-31 DIAGNOSIS — Y92199 Unspecified place in other specified residential institution as the place of occurrence of the external cause: Secondary | ICD-10-CM | POA: Diagnosis not present

## 2024-01-31 DIAGNOSIS — S50311A Abrasion of right elbow, initial encounter: Secondary | ICD-10-CM | POA: Insufficient documentation

## 2024-01-31 DIAGNOSIS — M4312 Spondylolisthesis, cervical region: Secondary | ICD-10-CM | POA: Diagnosis not present

## 2024-01-31 DIAGNOSIS — T1490XA Injury, unspecified, initial encounter: Secondary | ICD-10-CM

## 2024-01-31 DIAGNOSIS — R404 Transient alteration of awareness: Secondary | ICD-10-CM | POA: Diagnosis not present

## 2024-01-31 DIAGNOSIS — R519 Headache, unspecified: Secondary | ICD-10-CM | POA: Diagnosis not present

## 2024-01-31 DIAGNOSIS — I10 Essential (primary) hypertension: Secondary | ICD-10-CM | POA: Diagnosis not present

## 2024-01-31 DIAGNOSIS — S0990XA Unspecified injury of head, initial encounter: Secondary | ICD-10-CM | POA: Diagnosis not present

## 2024-01-31 DIAGNOSIS — G9389 Other specified disorders of brain: Secondary | ICD-10-CM | POA: Diagnosis not present

## 2024-01-31 DIAGNOSIS — I491 Atrial premature depolarization: Secondary | ICD-10-CM | POA: Diagnosis not present

## 2024-01-31 DIAGNOSIS — Z043 Encounter for examination and observation following other accident: Secondary | ICD-10-CM | POA: Diagnosis not present

## 2024-01-31 DIAGNOSIS — F411 Generalized anxiety disorder: Secondary | ICD-10-CM | POA: Diagnosis not present

## 2024-01-31 DIAGNOSIS — E785 Hyperlipidemia, unspecified: Secondary | ICD-10-CM | POA: Diagnosis not present

## 2024-01-31 DIAGNOSIS — S199XXA Unspecified injury of neck, initial encounter: Secondary | ICD-10-CM | POA: Diagnosis not present

## 2024-01-31 DIAGNOSIS — I499 Cardiac arrhythmia, unspecified: Secondary | ICD-10-CM | POA: Diagnosis not present

## 2024-01-31 DIAGNOSIS — R918 Other nonspecific abnormal finding of lung field: Secondary | ICD-10-CM | POA: Diagnosis not present

## 2024-01-31 LAB — CBC WITH DIFFERENTIAL/PLATELET
Abs Immature Granulocytes: 0.02 K/uL (ref 0.00–0.07)
Basophils Absolute: 0 K/uL (ref 0.0–0.1)
Basophils Relative: 0 %
Eosinophils Absolute: 0.3 K/uL (ref 0.0–0.5)
Eosinophils Relative: 4 %
HCT: 44.3 % (ref 39.0–52.0)
Hemoglobin: 14.7 g/dL (ref 13.0–17.0)
Immature Granulocytes: 0 %
Lymphocytes Relative: 5 %
Lymphs Abs: 0.4 K/uL — ABNORMAL LOW (ref 0.7–4.0)
MCH: 33.1 pg (ref 26.0–34.0)
MCHC: 33.2 g/dL (ref 30.0–36.0)
MCV: 99.8 fL (ref 80.0–100.0)
Monocytes Absolute: 0.8 K/uL (ref 0.1–1.0)
Monocytes Relative: 9 %
Neutro Abs: 7.6 K/uL (ref 1.7–7.7)
Neutrophils Relative %: 82 %
Platelets: 125 K/uL — ABNORMAL LOW (ref 150–400)
RBC: 4.44 MIL/uL (ref 4.22–5.81)
RDW: 11.9 % (ref 11.5–15.5)
WBC: 9.2 K/uL (ref 4.0–10.5)
nRBC: 0 % (ref 0.0–0.2)

## 2024-01-31 NOTE — Progress Notes (Incomplete)
..  Trauma Response Nurse Documentation   Brandon Burke is a 89 y.o. male arriving to [facility] ED via [facility]  On Eliquis  (apixaban ) daily. Trauma was activated as a Level 2 by charge nurse based on the following trauma criteria Elderly patients > 65 with head trauma on anti-coagulation (excluding ASA).  Patient cleared for CT by Dr. Jerral. Pt transported to CT with trauma response nurse present to monitor. RN remained with the patient throughout their absence from the department for clinical observation.   [facility] ***.  Trauma MD Arrival Time: n/a.  History   Past Medical History:  Diagnosis Date   Constipation    DVT (deep venous thrombosis) (HCC)    hx of 2001 (after prostate surgery)     Glaucoma    Hyperlipidemia    Hypertension    Melanoma (HCC)    face, hand   Mitral valve prolapse    OSA (obstructive sleep apnea) 11/26/2020   PE (pulmonary embolism)    5-09:was referred to hematology, and they recommend to discontinue Coumadin 5/11   Prostate cancer (HCC)     released from urology in 2010,needs yearly PSAs with PCP   Seasonal allergies      Past Surgical History:  Procedure Laterality Date   CATARACT EXTRACTION Bilateral    EYE SURGERY Bilateral    Cat Sx   PARS PLANA VITRECTOMY Left 09/06/2019   Procedure: PARS PLANA VITRECTOMY WITH 25 GAUGE WITH INTRAVITREAL ANTIBIOTICS;  Surgeon: Valdemar Rogue, MD;  Location: [facility] OR;  Service: Ophthalmology;  Laterality: Left;   PHOTOCOAGULATION WITH LASER Left 09/06/2019   Procedure: Photocoagulation With Laser;  Surgeon: Valdemar Rogue, MD;  Location: [facility] OR;  Service: Ophthalmology;  Laterality: Left;   PROSTATECTOMY  2000   RUPTURED GLOBE EXPLORATION AND REPAIR Left 08/16/2019   Procedure: OPEN GLOBE EXPLORATION EYE POSSIBLE ANTERIOR  CHAMBER WASH OUT;  Surgeon: Marcey Elspeth PARAS, MD;  Location: [facility] OR;  Service: Ophthalmology;  Laterality: Left;       Initial Focused Assessment (If applicable, or please see  trauma documentation):   CT's Completed:   CT Head and CT C-Spine   Interventions:   Plan for disposition:  Other   Consults completed:  {Trauma Consults:26862} at ***.  Event Summary:  MTP Summary (If applicable): N/A  Bedside handoff with {Trauma handoff:26863::ED RN} ***.    Jalani Rominger Dee  Trauma Response RN  Please call TRN at [phone] for further assistance.

## 2024-01-31 NOTE — ED Provider Notes (Signed)
 [facility] Provider Note   CSN: [id number] Arrival date & time: 01/31/24  2332     History No chief complaint on file.   HPI Brandon Burke is a 89 y.o. male presenting for chief complaint of fall. HX of HLD/OSA/TBI/PE on [facility] Coming from SNF  Per EMS: [facility] and hit his head at facility. No LOC. Was dizzy. Facility says LKW was 2200 Family arrived and said this is his baseline mental status. Per chart from 2021: Left pupil is not reactive. Pupils are unequal.   Patient's recorded medical, surgical, social, medication list and allergies were reviewed in the Snapshot window as part of the initial history.   Review of Systems   Review of Systems  Unable to perform ROS: Dementia    Physical Exam Updated Vital Signs BP 123/64   Pulse 80   Temp 99.8 F (37.7 C) (Oral)   Resp 11   Ht 6' (1.829 m)   Wt 65.5 kg   SpO2 99%   BMI 19.58 kg/m  Physical Exam Vitals and nursing note reviewed.  Constitutional:      General: He is not in acute distress.    Appearance: He is well-developed.  HENT:     Head: Normocephalic and atraumatic.  Eyes:     Conjunctiva/sclera: Conjunctivae normal.  Cardiovascular:     Rate and Rhythm: Normal rate and regular rhythm.     Heart sounds: No murmur heard. Pulmonary:     Effort: Pulmonary effort is normal. No respiratory distress.     Breath sounds: Normal breath sounds.  Abdominal:     Palpations: Abdomen is soft.     Tenderness: There is no abdominal tenderness.  Musculoskeletal:        General: Signs of injury (abrasion to right elbow) present. No swelling.     Cervical back: Neck supple.  Skin:    General: Skin is warm and dry.     Capillary Refill: Capillary refill takes less than 2 seconds.  Neurological:     Mental Status: He is alert.     Comments: Right pupil 4mm nonresponsive to light   Psychiatric:        Mood and Affect: Mood normal.      ED Course/ Medical Decision  Making/ A&P    Procedures Procedures   Medications Ordered in ED Medications - No data to display Medical Decision Making:   Br[facility] Burke is a 89 y.o. male who presented to the ED today with a fall at their living facility detailed above. They are on a blood thinner. Patient placed on continuous vitals and telemetry monitoring while in ED which was reviewed periodically.   Complete initial physical exam performed, notably the patient  was hemodynamically stable  in no acute distress. No obvious deformities or injuries appreciated on extensive physical exam including active range of motion of all joints.     Reviewed and confirmed nursing documentation for past medical history, family history, social history.    Initial Assessment/Plan:   This is a patient presenting with a moderate blunt mechanism trauma.  As such, I have considered intracranial injuries including intracranial hemorrhage, intrathoracic injuries including blunt myocardial or blunt lung injury, blunt abdominal injuries including aortic dissection, bladder injury, spleen injury, liver injury and I have considered orthopedic injuries including extremity or spinal injury. This is most consistent with an acute life/limb threatening illness complicated by underlying chronic conditions.  With the patient's presentation of moderate mechanism  trauma but an otherwise reassuring exam, patient warrants targeted evaluation for potential traumatic injuries. Will proceed with targeted evaluation for potential injuries. Will proceed with CT Head, Cervical Spine CT, and Chest/Pelvis XR.   Additionally, patient with pain at right elbow. Will evaluate with  XR. Images reviewed and agree with radiology interpretation.  DG Chest Portable 1 View Result Date: 02/01/2024 CLINICAL DATA:  Fall EXAM: PORTABLE CHEST 1 VIEW COMPARISON:  02/04/2021 FINDINGS: Multiple old left-sided rib fractures. Probable pleuroparenchymal scarring at left base. No  pleural effusion or pneumothorax. Atelectasis versus nodule at the right base. Normal cardiac size. No pneumothorax IMPRESSION: 1. No active disease. Multiple old left-sided rib fractures. 2. Atelectasis versus nodule at the right base. Suggest short interval radiographic follow-up or chest CT for further evaluation. Electronically Signed   By: [facility] M.D.   On: 02/01/2024 00:23   CT CERVICAL SPINE WO CONTRAST Result Date: 02/01/2024 CLINICAL DATA:  Trauma EXAM: CT CERVICAL SPINE WITHOUT CONTRAST TECHNIQUE: Multidetector CT imaging of the cervical spine was performed without intravenous contrast. Multiplanar CT image reconstructions were also generated. RADIATION DOSE REDUCTION: This exam was performed according to the departmental dose-optimization program which includes automated exposure control, adjustment of the mA and/or kV according to patient size and/or use of iterative reconstruction technique. COMPARISON:  CT 02/03/2021 FINDINGS: Alignment: Trace anterolisthesis C4 on C5. Facet alignment within normal limits Skull base and vertebrae: No acute fracture. No primary bone lesion or focal pathologic process. Soft tissues and spinal canal: No prevertebral fluid or swelling. No visible canal hematoma. Disc levels: Multilevel degenerative change. Moderate advanced disc space narrowing C3-C4, C5-C6 and C6-C7. Multilevel facet degenerative changes with foraminal narrowing. No high-grade canal stenosis Upper chest: Negative. Other: None IMPRESSION: Multilevel degenerative changes. No acute osseous abnormality. Electronically Signed   By: [facility] M.D.   On: 02/01/2024 00:22   DG ELBOW COMPLETE RIGHT (3+VIEW) Result Date: 02/01/2024 EXAM: 3 VIEW(S) XRAY OF THE RIGHT ELBOW COMPARISON: None available. CLINICAL HISTORY: [id number] Fall (on)(from) escalator, initial encounter [phone]. Pt BIB [facility] from [facility] due to witnessed fall. Pt fell and hit head. No LOC. LKW estimated 2200.  Compliant on Eliquis . Upon assessment, EMS noticed blown left pupil. Hx of TBI. Skin tear is present on left elbow. Hx of dementia. FINDINGS: BONES AND JOINTS: No acute fracture. No focal osseous lesion. No joint dislocation. Assessment for joint effusion is limited due to obliquity of the lateral. SOFT TISSUES: The soft tissues are unremarkable. IMPRESSION: 1. No acute abnormality. Electronically signed by: Norman Gatlin MD 02/01/2024 12:22 AM EDT RP Workstation: [id number]   CT HEAD WO CONTRAST ( ) Result Date: 02/01/2024 CLINICAL DATA:  Head trauma EXAM: CT HEAD WITHOUT CONTRAST TECHNIQUE: Contiguous axial images were obtained from the base of the skull through the vertex without intravenous contrast. RADIATION DOSE REDUCTION: This exam was performed according to the departmental dose-optimization program which includes automated exposure control, adjustment of the mA and/or kV according to patient size and/or use of iterative reconstruction technique. COMPARISON:  Head CT 08/09/2019, 02/03/2021 FINDINGS: Brain: No acute territorial infarction, hemorrhage or intracranial mass. Atrophy and advanced chronic small vessel ischemic changes of the white matter. Small chronic right basal ganglial and white matter infarct. Small chronic appearing left frontal parietal infarct at the vertex but new compared with 2022. Small chronic appearing cerebellar infarcts. The ventricles are prominent in size but likely due to atrophy. Mild ex vacuo enlargement of the right lateral ventricle. Vascular: No hyperdense vessels.  Carotid  vascular calcification Skull: No fracture Sinuses/Orbits: Moderate mucosal thickening in the sinuses. Old left medial orbital fracture. Old left orbital floor fracture Other: None IMPRESSION: 1. No CT evidence for acute intracranial abnormality. 2. Atrophy and advanced chronic small vessel ischemic changes of the white matter. Multiple small chronic appearing infarcts as described above  Electronically Signed   By: [facility] M.D.   On: 02/01/2024 00:19   DG Pelvis Portable Result Date: 02/01/2024 CLINICAL DATA:  Fall EXAM: PORTABLE PELVIS 1-2 VIEWS COMPARISON:  02/03/2021 FINDINGS: SI joints are non widened. Clips in the pelvis. Pubic symphysis and rami appear intact. No definitive fracture or malalignment IMPRESSION: No acute osseous abnormality. Cross-section imaging follow-up if high suspicion for fracture Electronically Signed   By: [facility] M.D.   On: 02/01/2024 00:13    Final Reassessment and Plan:   Negative imaging. At mental status baseline per family.   Disposition:  I have considered need for hospitalization, however, considering all of the above, I believe this patient is stable for discharge at this time.  Patient/family educated about specific return precautions for given chief complaint and symptoms.  Patient/family educated about follow-up with PCP .     Patient/family expressed understanding of return precautions and need for follow-up. Patient spoken to regarding all imaging and laboratory results and appropriate follow up for these results. All education provided in verbal form with additional information in written form. Time was allowed for answering of patient questions. Patient discharged.    Emergency Department Medication Summary:   Medications - No data to display          Clinical Impression:  1. Trauma      Discharge   Final Clinical Impression(s) / ED Diagnoses Final diagnoses:  Trauma    Rx / DC Orders ED Discharge Orders     None         Jerral Meth, MD 02/01/24 0105

## 2024-01-31 NOTE — ED Triage Notes (Signed)
 Pt BIB [facility] from [place] due to witnessed fall. Pt fell and hit head. No LOC. LKW estimated 2200.  Compliant on Eliquis .  Upon assessment, EMS noticed blown left pupil. Hx of TBI. Skin tear is present on left elbow. Hx of dementia.  153/71 100.3 temp CBG 125

## 2024-01-31 NOTE — Progress Notes (Signed)
 Orthopedic Tech Progress Note Patient Details:  Ardell Makarewicz [facility] 1932/04/09 [id number]  Patient ID: Vinie ORN People, male   DOB: 01/11/1932, 89 y.o.   MRN: [id number] Checked in for level 2 trauma.  Morna Pink 01/31/2024, 11:44 PM

## 2024-02-01 ENCOUNTER — Emergency Department (HOSPITAL_COMMUNITY)

## 2024-02-01 ENCOUNTER — Other Ambulatory Visit: Payer: Self-pay

## 2024-02-01 ENCOUNTER — Encounter (HOSPITAL_COMMUNITY): Payer: Self-pay

## 2024-02-01 DIAGNOSIS — S199XXA Unspecified injury of neck, initial encounter: Secondary | ICD-10-CM | POA: Diagnosis not present

## 2024-02-01 DIAGNOSIS — R918 Other nonspecific abnormal finding of lung field: Secondary | ICD-10-CM | POA: Diagnosis not present

## 2024-02-01 DIAGNOSIS — I6782 Cerebral ischemia: Secondary | ICD-10-CM | POA: Diagnosis not present

## 2024-02-01 DIAGNOSIS — S0990XA Unspecified injury of head, initial encounter: Secondary | ICD-10-CM | POA: Diagnosis not present

## 2024-02-01 DIAGNOSIS — M4312 Spondylolisthesis, cervical region: Secondary | ICD-10-CM | POA: Diagnosis not present

## 2024-02-01 DIAGNOSIS — Z043 Encounter for examination and observation following other accident: Secondary | ICD-10-CM | POA: Diagnosis not present

## 2024-02-01 DIAGNOSIS — M4802 Spinal stenosis, cervical region: Secondary | ICD-10-CM | POA: Diagnosis not present

## 2024-02-01 DIAGNOSIS — G9389 Other specified disorders of brain: Secondary | ICD-10-CM | POA: Diagnosis not present

## 2024-02-01 DIAGNOSIS — M47812 Spondylosis without myelopathy or radiculopathy, cervical region: Secondary | ICD-10-CM | POA: Diagnosis not present

## 2024-02-01 DIAGNOSIS — S51011A Laceration without foreign body of right elbow, initial encounter: Secondary | ICD-10-CM | POA: Diagnosis not present

## 2024-02-01 LAB — COMPREHENSIVE METABOLIC PANEL WITH GFR
ALT: 20 U/L (ref 0–44)
AST: 23 U/L (ref 15–41)
Albumin: 3.1 g/dL — ABNORMAL LOW (ref 3.5–5.0)
Alkaline Phosphatase: 73 U/L (ref 38–126)
Anion gap: 10 (ref 5–15)
BUN: 19 mg/dL (ref 8–23)
CO2: 24 mmol/L (ref 22–32)
Calcium: 8.7 mg/dL — ABNORMAL LOW (ref 8.9–10.3)
Chloride: 103 mmol/L (ref 98–111)
Creatinine, Ser: 1.22 mg/dL (ref 0.61–1.24)
GFR, Estimated: 56 mL/min — ABNORMAL LOW (ref >60)
Glucose, Bld: 123 mg/dL — ABNORMAL HIGH (ref 70–99)
Potassium: 3.7 mmol/L (ref 3.5–5.1)
Sodium: 137 mmol/L (ref 135–145)
Total Bilirubin: 0.4 mg/dL (ref 0.0–1.2)
Total Protein: 6 g/dL — ABNORMAL LOW (ref 6.5–8.1)

## 2024-02-01 NOTE — ED Notes (Signed)
 Discharge instructions reviewed.   Opportunity for questions and concerns provided.   Alert, oriented and escorted to brothers vehicle via wheelchair.   Displays no signs of distress.

## 2024-02-01 NOTE — ED Notes (Signed)
 Patient transported to CT

## 2024-02-06 DIAGNOSIS — M6281 Muscle weakness (generalized): Secondary | ICD-10-CM | POA: Diagnosis not present

## 2024-02-06 DIAGNOSIS — R4182 Altered mental status, unspecified: Secondary | ICD-10-CM | POA: Diagnosis not present

## 2024-02-06 DIAGNOSIS — W19XXXA Unspecified fall, initial encounter: Secondary | ICD-10-CM | POA: Diagnosis not present

## 2024-02-06 DIAGNOSIS — S0990XA Unspecified injury of head, initial encounter: Secondary | ICD-10-CM | POA: Diagnosis not present

## 2024-02-08 DIAGNOSIS — R4182 Altered mental status, unspecified: Secondary | ICD-10-CM | POA: Diagnosis not present

## 2024-02-15 DIAGNOSIS — F32A Depression, unspecified: Secondary | ICD-10-CM | POA: Diagnosis not present

## 2024-02-15 DIAGNOSIS — Z9181 History of falling: Secondary | ICD-10-CM | POA: Diagnosis not present

## 2024-02-15 DIAGNOSIS — E039 Hypothyroidism, unspecified: Secondary | ICD-10-CM | POA: Diagnosis not present

## 2024-02-15 DIAGNOSIS — F05 Delirium due to known physiological condition: Secondary | ICD-10-CM | POA: Diagnosis not present

## 2024-02-15 DIAGNOSIS — E782 Mixed hyperlipidemia: Secondary | ICD-10-CM | POA: Diagnosis not present

## 2024-02-15 DIAGNOSIS — H409 Unspecified glaucoma: Secondary | ICD-10-CM | POA: Diagnosis not present

## 2024-02-15 DIAGNOSIS — G4730 Sleep apnea, unspecified: Secondary | ICD-10-CM | POA: Diagnosis not present

## 2024-02-15 DIAGNOSIS — E559 Vitamin D deficiency, unspecified: Secondary | ICD-10-CM | POA: Diagnosis not present

## 2024-02-15 DIAGNOSIS — F0394 Unspecified dementia, unspecified severity, with anxiety: Secondary | ICD-10-CM | POA: Diagnosis not present

## 2024-02-15 DIAGNOSIS — Z7901 Long term (current) use of anticoagulants: Secondary | ICD-10-CM | POA: Diagnosis not present

## 2024-02-15 DIAGNOSIS — F0393 Unspecified dementia, unspecified severity, with mood disturbance: Secondary | ICD-10-CM | POA: Diagnosis not present

## 2024-02-15 DIAGNOSIS — F03918 Unspecified dementia, unspecified severity, with other behavioral disturbance: Secondary | ICD-10-CM | POA: Diagnosis not present

## 2024-02-15 DIAGNOSIS — Z87820 Personal history of traumatic brain injury: Secondary | ICD-10-CM | POA: Diagnosis not present

## 2024-02-27 DIAGNOSIS — E782 Mixed hyperlipidemia: Secondary | ICD-10-CM | POA: Diagnosis not present

## 2024-02-27 DIAGNOSIS — H409 Unspecified glaucoma: Secondary | ICD-10-CM | POA: Diagnosis not present

## 2024-02-27 DIAGNOSIS — E559 Vitamin D deficiency, unspecified: Secondary | ICD-10-CM | POA: Diagnosis not present

## 2024-02-29 DIAGNOSIS — F0390 Unspecified dementia without behavioral disturbance: Secondary | ICD-10-CM | POA: Diagnosis not present

## 2024-02-29 DIAGNOSIS — G4733 Obstructive sleep apnea (adult) (pediatric): Secondary | ICD-10-CM | POA: Diagnosis not present

## 2024-02-29 DIAGNOSIS — F411 Generalized anxiety disorder: Secondary | ICD-10-CM | POA: Diagnosis not present

## 2024-03-01 DIAGNOSIS — F32A Depression, unspecified: Secondary | ICD-10-CM | POA: Diagnosis not present

## 2024-03-01 DIAGNOSIS — F0394 Unspecified dementia, unspecified severity, with anxiety: Secondary | ICD-10-CM | POA: Diagnosis not present

## 2024-03-01 DIAGNOSIS — F0393 Unspecified dementia, unspecified severity, with mood disturbance: Secondary | ICD-10-CM | POA: Diagnosis not present

## 2024-03-15 DIAGNOSIS — R4182 Altered mental status, unspecified: Secondary | ICD-10-CM | POA: Diagnosis not present

## 2024-03-15 DIAGNOSIS — R410 Disorientation, unspecified: Secondary | ICD-10-CM | POA: Diagnosis not present

## 2024-03-19 DIAGNOSIS — Z9181 History of falling: Secondary | ICD-10-CM | POA: Diagnosis not present

## 2024-03-19 DIAGNOSIS — R2681 Unsteadiness on feet: Secondary | ICD-10-CM | POA: Diagnosis not present

## 2024-03-19 DIAGNOSIS — M6281 Muscle weakness (generalized): Secondary | ICD-10-CM | POA: Diagnosis not present

## 2024-03-19 DIAGNOSIS — F0280 Dementia in other diseases classified elsewhere without behavioral disturbance: Secondary | ICD-10-CM | POA: Diagnosis not present

## 2024-03-19 DIAGNOSIS — E039 Hypothyroidism, unspecified: Secondary | ICD-10-CM | POA: Diagnosis not present

## 2024-03-19 DIAGNOSIS — K5909 Other constipation: Secondary | ICD-10-CM | POA: Diagnosis not present

## 2024-03-25 ENCOUNTER — Emergency Department (HOSPITAL_COMMUNITY)
Admission: EM | Admit: 2024-03-25 | Discharge: 2024-03-25 | Disposition: A | Discharge status: Skilled Nursing Facility | Attending: Emergency Medicine | Admitting: Emergency Medicine

## 2024-03-25 ENCOUNTER — Encounter (HOSPITAL_COMMUNITY): Payer: Self-pay | Admitting: Emergency Medicine

## 2024-03-25 ENCOUNTER — Other Ambulatory Visit: Payer: Self-pay

## 2024-03-25 DIAGNOSIS — Z7901 Long term (current) use of anticoagulants: Secondary | ICD-10-CM | POA: Insufficient documentation

## 2024-03-25 DIAGNOSIS — Z79899 Other long term (current) drug therapy: Secondary | ICD-10-CM | POA: Insufficient documentation

## 2024-03-25 DIAGNOSIS — F0390 Unspecified dementia without behavioral disturbance: Secondary | ICD-10-CM | POA: Insufficient documentation

## 2024-03-25 DIAGNOSIS — R296 Repeated falls: Secondary | ICD-10-CM | POA: Diagnosis not present

## 2024-03-25 DIAGNOSIS — R9431 Abnormal electrocardiogram [ECG] [EKG]: Secondary | ICD-10-CM | POA: Diagnosis not present

## 2024-03-25 DIAGNOSIS — N3000 Acute cystitis without hematuria: Secondary | ICD-10-CM | POA: Insufficient documentation

## 2024-03-25 LAB — URINALYSIS, ROUTINE W REFLEX MICROSCOPIC
Bacteria, UA: NONE SEEN
Bilirubin Urine: NEGATIVE
Glucose, UA: NEGATIVE mg/dL
Hgb urine dipstick: NEGATIVE
Ketones, ur: NEGATIVE mg/dL
Nitrite: NEGATIVE
Protein, ur: NEGATIVE mg/dL
Specific Gravity, Urine: 1.017 (ref 1.005–1.030)
WBC, UA: >50 WBC/hpf (ref 0–5)
pH: 6 (ref 5.0–8.0)

## 2024-03-25 LAB — COMPREHENSIVE METABOLIC PANEL WITH GFR
ALT: 23 U/L (ref 0–44)
AST: 31 U/L (ref 15–41)
Albumin: 4.6 g/dL (ref 3.5–5.0)
Alkaline Phosphatase: 113 U/L (ref 38–126)
Anion gap: 9 (ref 5–15)
BUN: 21 mg/dL (ref 8–23)
CO2: 28 mmol/L (ref 22–32)
Calcium: 9.5 mg/dL (ref 8.9–10.3)
Chloride: 104 mmol/L (ref 98–111)
Creatinine, Ser: 1.1 mg/dL (ref 0.61–1.24)
GFR, Estimated: >60 mL/min (ref >60)
Glucose, Bld: 71 mg/dL (ref 70–99)
Potassium: 4.3 mmol/L (ref 3.5–5.1)
Sodium: 141 mmol/L (ref 135–145)
Total Bilirubin: 0.7 mg/dL (ref 0.0–1.2)
Total Protein: 8.1 g/dL (ref 6.5–8.1)

## 2024-03-25 LAB — CBC WITH DIFFERENTIAL/PLATELET
Abs Immature Granulocytes: 0.02 K/uL (ref 0.00–0.07)
Basophils Absolute: 0.1 K/uL (ref 0.0–0.1)
Basophils Relative: 1 %
Eosinophils Absolute: 0.2 K/uL (ref 0.0–0.5)
Eosinophils Relative: 3 %
HCT: 45.9 % (ref 39.0–52.0)
Hemoglobin: 15.1 g/dL (ref 13.0–17.0)
Immature Granulocytes: 0 %
Lymphocytes Relative: 11 %
Lymphs Abs: 0.8 K/uL (ref 0.7–4.0)
MCH: 32.8 pg (ref 26.0–34.0)
MCHC: 32.9 g/dL (ref 30.0–36.0)
MCV: 99.6 fL (ref 80.0–100.0)
Monocytes Absolute: 0.6 K/uL (ref 0.1–1.0)
Monocytes Relative: 8 %
Neutro Abs: 5.5 K/uL (ref 1.7–7.7)
Neutrophils Relative %: 77 %
Platelets: 179 K/uL (ref 150–400)
RBC: 4.61 MIL/uL (ref 4.22–5.81)
RDW: 12.7 % (ref 11.5–15.5)
WBC: 7.2 K/uL (ref 4.0–10.5)
nRBC: 0 % (ref 0.0–0.2)

## 2024-03-25 MED ORDER — CEPHALEXIN 500 MG PO CAPS
500.0000 mg | ORAL_CAPSULE | Freq: Once | ORAL | Status: AC
  Administered 2024-03-25: 500 mg via ORAL
  Filled 2024-03-25: qty 1

## 2024-03-25 MED ORDER — CEPHALEXIN 500 MG PO CAPS: 500.0000 mg | ORAL_CAPSULE | Freq: Three times a day (TID) | ORAL | 0 refills | Status: DC

## 2024-03-25 MED ORDER — CEPHALEXIN 500 MG PO CAPS: 500.0000 mg | ORAL_CAPSULE | Freq: Three times a day (TID) | ORAL | 0 refills | Status: AC

## 2024-03-25 NOTE — ED Provider Notes (Signed)
 [place] EMERGENCY DEPARTMENT AT [facility] Provider Note   CSN: [id number] Arrival date & time: 03/25/24  9065     Patient presents with: Brandon Burke is a 89 y.o. male.    Fall   This patient is a 89 year old male with a history of dementia, he presents to the hospital today complaining of several falls that occurred over the last 24 hours, the patient has had evaluations in the ER as recently as 2 months ago for a fall, he was also seen in April 2025 after a fall and has had office visits because of frequent falls.  The patient has a history of dementia, he has had some progressive dementia, he stays most of the day in the bed but will try to get up and walk, he used to walk with a walker until 2 weeks ago where they started to use a wheelchair.  He is now standing up out of the wheelchair which is when he falls.  The brother who is the healthcare power of attorney reports that he is on a blood thinner because of blood clots in his legs, I have had a long discussion with the brother about the downside to continuing to take a blood thinner if he is having frequent falls and no history of DVTs in quite some time.  There is no signs of injury, the brother states that he was sent here [facility] so that they can cover their ass.  The brother does not want any specific testing done as far as CT scans or surgery and the patient is DNR    Prior to Admission medications   Medication Sig Start Date End Date Taking? Authorizing Provider  cephALEXin  (KEFLEX ) 500 MG capsule Take 1 capsule (500 mg total) by mouth 3 (three) times daily for 7 days. 03/25/24 04/01/24 Yes Cleotilde Rogue, MD  acetaminophen  (TYLENOL ) 325 MG tablet Take 2 tablets (650 mg total) by mouth every 6 (six) hours as needed for mild pain. 02/25/21   Angiulli, Toribio PARAS, PA-C  amitriptyline  (ELAVIL ) 10 MG tablet Take 1 tablet (10 mg total) by mouth at bedtime. 04/21/23   Amon Aloysius BRAVO, MD  apixaban   (ELIQUIS ) 2.5 MG TABS tablet Take 1 tablet (2.5 mg total) by mouth 2 (two) times daily. 05/25/23   Amon Aloysius BRAVO, MD  ARTIFICIAL TEAR OP Place 1 drop into both eyes as needed (dry eyes).    [provider]  atenolol  (TENORMIN ) 25 MG tablet Take 1 tablet (25 mg total) by mouth daily. 07/22/23   Paz, Jose E, MD  azelastine  (ASTELIN ) 0.1 % nasal spray Place 2 sprays into both nostrils 2 (two) times daily. Use in each nostril as directed    [provider]  calcium  carbonate (TUMS - DOSED IN MG ELEMENTAL CALCIUM ) 500 MG chewable tablet Chew 1-2 tablets by mouth as needed for indigestion or heartburn.    [provider]  Cholecalciferol (VITAMIN D3) 25 MCG (1000 UT) CAPS Take 1 capsule (1,000 Units total) by mouth daily. 02/25/21   Angiulli, Toribio PARAS, PA-C  docusate sodium  (COLACE) 100 MG capsule Take 1 capsule (100 mg total) by mouth 2 (two) times daily. Patient not taking: Reported on 09/27/2023 02/25/21   Angiulli, Toribio PARAS, PA-C  donepezil  (ARICEPT ) 10 MG tablet Take 1 tablet (10 mg total) by mouth at bedtime. 05/25/23   Amon Aloysius BRAVO, MD  dorzolamide -timolol  (COSOPT ) 2-0.5 % ophthalmic solution Place 1 drop into both eyes 2 (two)  times daily. 10/12/23     escitalopram  (LEXAPRO ) 10 MG tablet Take 2 tablets (20 mg total) by mouth daily. 05/25/23   Paz, Jose E, MD  fluorouracil (EFUDEX) 5 % cream Apply topically 2 (two) times daily. Use as directed on scalp    [provider]  fluticasone  (FLONASE ) 50 MCG/ACT nasal spray Place 2 sprays into both nostrils daily as needed for allergies or rhinitis. 12/02/22   Paz, Jose E, MD  latanoprost  (XALATAN ) 0.005 % ophthalmic solution Place 1 drop into both eyes at bedtime. 06/24/23     levothyroxine  (SYNTHROID ) 50 MCG tablet Take 1 tablet (50 mcg total) by mouth daily before breakfast. 05/25/23   Paz, Jose E, MD  MELATONIN PO Take 4 mg by mouth at bedtime.    [provider]  Multiple Vitamin (MULTIVITAMIN) tablet Take 1 tablet by  mouth daily. Takes at lunch    [provider]  polyethylene glycol (MIRALAX  / GLYCOLAX ) 17 g packet Take 17 g by mouth daily. 02/25/21   Angiulli, Toribio PARAS, PA-C  pravastatin  (PRAVACHOL ) 40 MG tablet Take 1 tablet (40 mg total) by mouth at bedtime. 08/09/23   Amon Aloysius BRAVO, MD    Allergies: Sulfonamide derivatives and Nitrofuran derivatives    Review of Systems  All other systems reviewed and are negative.   Updated Vital Signs BP 132/62 (BP Location: Left Arm)   Pulse 74   Temp 97.8 F (36.6 C) (Oral)   Resp 17   Ht 1.854 m (6' 1)   Wt 77.6 kg   SpO2 99%   BMI 22.56 kg/m   Physical Exam Vitals and nursing note reviewed.  Constitutional:      General: He is not in acute distress.    Appearance: He is well-developed.  HENT:     Head: Normocephalic and atraumatic.     Mouth/Throat:     Pharynx: No oropharyngeal exudate.  Eyes:     General: No scleral icterus.       Right eye: No discharge.     Conjunctiva/sclera: Conjunctivae normal.     Comments: Chronic left eye blindness and abnormality, right eye appears normal  Neck:     Thyroid : No thyromegaly.     Vascular: No JVD.  Cardiovascular:     Rate and Rhythm: Normal rate and regular rhythm.     Heart sounds: Normal heart sounds. No murmur heard.    No friction rub. No gallop.  Pulmonary:     Effort: Pulmonary effort is normal. No respiratory distress.     Breath sounds: Normal breath sounds. No wheezing or rales.  Abdominal:     General: Bowel sounds are normal. There is no distension.     Palpations: Abdomen is soft. There is no mass.     Tenderness: There is no abdominal tenderness.  Musculoskeletal:        General: No tenderness. Normal range of motion.     Cervical back: Normal range of motion and neck supple.     Right lower leg: No edema.     Left lower leg: No edema.     Comments: The patient is able to follow commands, he is able to sit up in the bed, he moves all 4 extremities without any signs of  deformity, compartments are soft, joints are supple  Lymphadenopathy:     Cervical: No cervical adenopathy.  Skin:    General: Skin is warm and dry.     Findings: No erythema or rash.  Neurological:  Mental Status: He is alert.     Coordination: Coordination normal.  Psychiatric:        Behavior: Behavior normal.     (all labs ordered are listed, but only abnormal results are displayed) Labs Reviewed  URINALYSIS, ROUTINE W REFLEX MICROSCOPIC - Abnormal; Notable for the following components:      Result Value   Leukocytes,Ua SMALL (*)    All other components within normal limits  URINE CULTURE  CBC WITH DIFFERENTIAL/PLATELET  COMPREHENSIVE METABOLIC PANEL WITH GFR    EKG: EKG Interpretation Date/Time:  "Sunday March 25 2024 10:27:14 EST Ventricular Rate:  73 PR Interval:  146 QRS Duration:  91 QT Interval:  426 QTC Calculation: 470 R Axis:   -12  Text Interpretation: Sinus rhythm Consider left atrial enlargement Left ventricular hypertrophy Since last tracing lvh now seen Confirmed by Lindia Garms ([id number]) on 03/25/2024 11:02:04 AM  Radiology: No results found.   Procedures   Medications Ordered in the ED  cephALEXin (KEFLEX) capsule 500 mg (has no administration in time range)                                    Medical Decision Making Amount and/or Complexity of Data Reviewed Labs: ordered. ECG/medicine tests: ordered.  Risk Prescription drug management.   Elderly patient, vital signs normal, will stop Eliquis immediately at the request of the brother I think this is very logical and appropriate given that the patient is a fall risk and has no history of DVTs in several years according to the brother's report.  Will obtain some labs including CBC metabolic panel and a urinalysis as well as an EKG to look for other low hanging fruit that might easily be treated however I agree with the patient's brother that a CT scan of the brain or other aggressive or  advanced imaging studies would not be warranted given the patient's overall general health and decline with severe dementia  Labs:  I  personally viewed and interpreted the labs which show CBC and metabolic panel unremarkable, urinalysis consistent with possible UTI with many white blood cells,  Culture ordered  Cephalexin given  I have discussed with the patient at the bedside the results, and the meaning of these results.  They have had opportunity to ask questions,  expressed their understanding to the need for follow-up with primary care physician      Final diagnoses:  Acute cystitis without hematuria  Frequent falls    ED Discharge Orders          Ordered    cephALEXin (KEFLEX) 500 MG capsule  3 times daily        11" /23/25 1241               Cleotilde Rogue, MD 03/25/24 1242

## 2024-03-25 NOTE — ED Triage Notes (Signed)
 Pt to the ED with his brother from [place] after having had 3 falls in the last 24 hours. The brother states he has had no injuries from the recent falls, but the SNF is requesting labs.  Pt normally walks with a walker, but has been unable to do so for the past 2 weeks.  Pt forgets and stands up from the wheelchair causing falls.  Pt has a history of dementia.

## 2024-03-25 NOTE — Discharge Instructions (Signed)
 Your testing today shows that you have a urinary infection, take cephalexin  3 times a day for 7 days  Thank you for allowing us  to treat you in the emergency department today.  After reviewing your examination and potential testing that was done it appears that you are safe to go home.  I would like for you to follow-up with your doctor within the next several days, have them obtain your records and follow-up with them to review all potential tests and results from your visit.  If you should develop severe or worsening symptoms return to the emergency department immediately

## 2024-03-26 DIAGNOSIS — Z86718 Personal history of other venous thrombosis and embolism: Secondary | ICD-10-CM | POA: Diagnosis not present

## 2024-03-26 DIAGNOSIS — R296 Repeated falls: Secondary | ICD-10-CM | POA: Diagnosis not present

## 2024-03-26 DIAGNOSIS — Z7901 Long term (current) use of anticoagulants: Secondary | ICD-10-CM | POA: Diagnosis not present

## 2024-03-26 DIAGNOSIS — N3000 Acute cystitis without hematuria: Secondary | ICD-10-CM | POA: Diagnosis not present

## 2024-03-26 DIAGNOSIS — R2681 Unsteadiness on feet: Secondary | ICD-10-CM | POA: Diagnosis not present

## 2024-03-26 DIAGNOSIS — M6281 Muscle weakness (generalized): Secondary | ICD-10-CM | POA: Diagnosis not present

## 2024-03-28 LAB — URINE CULTURE: Culture: 20000 — AB

## 2024-03-29 ENCOUNTER — Telehealth (HOSPITAL_BASED_OUTPATIENT_CLINIC_OR_DEPARTMENT_OTHER): Payer: Self-pay | Admitting: *Deleted

## 2024-03-29 NOTE — Progress Notes (Signed)
 ED Antimicrobial Stewardship Positive Culture Follow Up   Brandon Burke is an 89 y.o. male who presented to [facility] on @ADMITDT @ with a chief complaint of  Chief Complaint  Patient presents with   Fall    Recent Results (from the past 720 hours)  Urine Culture     Status: Abnormal   Collection Time: 03/25/24 10:17 AM   Specimen: Urine, Clean Catch  Result Value Ref Range Status   Specimen Description   Final    URINE, CLEAN CATCH Performed at [facility], [address] [place], [place] [address]    Special Requests   Final    NONE Performed at [facility] [address], [place], [place] [address]    Culture 20,000 COLONIES/mL ENTEROCOCCUS FAECALIS (A)  Final   Report Status 03/28/2024 FINAL  Final   Organism ID, Bacteria ENTEROCOCCUS FAECALIS (A)  Final      Susceptibility   Enterococcus faecalis - MIC*    AMPICILLIN <=2 SENSITIVE Sensitive     NITROFURANTOIN <=16 SENSITIVE Sensitive     VANCOMYCIN  1 SENSITIVE Sensitive     * 20,000 COLONIES/mL ENTEROCOCCUS FAECALIS    [x]  Treated with Keflex , organism resistant to prescribed antimicrobial  STOP Keflex  Start: Augmentin 875/125 mg tablets 1 tablet twice daily for 7 days (Qty 14; Refills 0)  ED Provider: [place] MD   Dorn Buttner, PharmD, BCPS 03/29/2024 8:02 AM ED Clinical Pharmacist -  [phone]

## 2024-04-02 DIAGNOSIS — H02423 Myogenic ptosis of bilateral eyelids: Secondary | ICD-10-CM | POA: Diagnosis not present

## 2024-04-02 DIAGNOSIS — H4032X3 Glaucoma secondary to eye trauma, left eye, severe stage: Secondary | ICD-10-CM | POA: Diagnosis not present

## 2024-04-02 DIAGNOSIS — H401111 Primary open-angle glaucoma, right eye, mild stage: Secondary | ICD-10-CM | POA: Diagnosis not present

## 2024-04-02 DIAGNOSIS — H16212 Exposure keratoconjunctivitis, left eye: Secondary | ICD-10-CM | POA: Diagnosis not present

## 2024-04-09 DIAGNOSIS — L02811 Cutaneous abscess of head [any part, except face]: Secondary | ICD-10-CM | POA: Diagnosis not present

## 2024-05-24 ENCOUNTER — Emergency Department (HOSPITAL_COMMUNITY)

## 2024-05-24 ENCOUNTER — Emergency Department (HOSPITAL_COMMUNITY)
Admission: EM | Admit: 2024-05-24 | Discharge: 2024-05-25 | Disposition: A | Discharge status: Nursing Home (Includes ICF) | Source: Skilled Nursing Facility | Attending: Emergency Medicine | Admitting: Emergency Medicine

## 2024-05-24 ENCOUNTER — Encounter (HOSPITAL_COMMUNITY): Payer: Self-pay

## 2024-05-24 DIAGNOSIS — Z79899 Other long term (current) drug therapy: Secondary | ICD-10-CM | POA: Diagnosis not present

## 2024-05-24 DIAGNOSIS — S0990XA Unspecified injury of head, initial encounter: Secondary | ICD-10-CM | POA: Diagnosis present

## 2024-05-24 DIAGNOSIS — E039 Hypothyroidism, unspecified: Secondary | ICD-10-CM | POA: Diagnosis not present

## 2024-05-24 DIAGNOSIS — F0390 Unspecified dementia without behavioral disturbance: Secondary | ICD-10-CM | POA: Diagnosis not present

## 2024-05-24 DIAGNOSIS — Z7901 Long term (current) use of anticoagulants: Secondary | ICD-10-CM | POA: Insufficient documentation

## 2024-05-24 DIAGNOSIS — W19XXXA Unspecified fall, initial encounter: Secondary | ICD-10-CM

## 2024-05-24 DIAGNOSIS — W01198A Fall on same level from slipping, tripping and stumbling with subsequent striking against other object, initial encounter: Secondary | ICD-10-CM | POA: Diagnosis not present

## 2024-05-24 DIAGNOSIS — R410 Disorientation, unspecified: Secondary | ICD-10-CM | POA: Insufficient documentation

## 2024-05-24 LAB — CBC WITH DIFFERENTIAL/PLATELET
Abs Immature Granulocytes: 0.02 K/uL (ref 0.00–0.07)
Basophils Absolute: 0 K/uL (ref 0.0–0.1)
Basophils Relative: 1 %
Eosinophils Absolute: 0.3 K/uL (ref 0.0–0.5)
Eosinophils Relative: 6 %
HCT: 45.1 % (ref 39.0–52.0)
Hemoglobin: 14.7 g/dL (ref 13.0–17.0)
Immature Granulocytes: 0 %
Lymphocytes Relative: 19 %
Lymphs Abs: 1.2 K/uL (ref 0.7–4.0)
MCH: 32.2 pg (ref 26.0–34.0)
MCHC: 32.6 g/dL (ref 30.0–36.0)
MCV: 98.9 fL (ref 80.0–100.0)
Monocytes Absolute: 0.8 K/uL (ref 0.1–1.0)
Monocytes Relative: 13 %
Neutro Abs: 3.7 K/uL (ref 1.7–7.7)
Neutrophils Relative %: 61 %
Platelets: 196 K/uL (ref 150–400)
RBC: 4.56 MIL/uL (ref 4.22–5.81)
RDW: 12.1 % (ref 11.5–15.5)
WBC: 6 K/uL (ref 4.0–10.5)
nRBC: 0 % (ref 0.0–0.2)

## 2024-05-24 LAB — COMPREHENSIVE METABOLIC PANEL WITH GFR
ALT: 22 U/L (ref 0–44)
AST: 24 U/L (ref 15–41)
Albumin: 4.5 g/dL (ref 3.5–5.0)
Alkaline Phosphatase: 112 U/L (ref 38–126)
Anion gap: 15 (ref 5–15)
BUN: 23 mg/dL (ref 8–23)
CO2: 25 mmol/L (ref 22–32)
Calcium: 9.6 mg/dL (ref 8.9–10.3)
Chloride: 105 mmol/L (ref 98–111)
Creatinine, Ser: 1.43 mg/dL — ABNORMAL HIGH (ref 0.61–1.24)
GFR, Estimated: 47 mL/min — ABNORMAL LOW
Glucose, Bld: 79 mg/dL (ref 70–99)
Potassium: 4.6 mmol/L (ref 3.5–5.1)
Sodium: 144 mmol/L (ref 135–145)
Total Bilirubin: 0.4 mg/dL (ref 0.0–1.2)
Total Protein: 8 g/dL (ref 6.5–8.1)

## 2024-05-24 LAB — PROTIME-INR
INR: 0.9 (ref 0.8–1.2)
Prothrombin Time: 12.9 s (ref 11.4–15.2)

## 2024-05-24 LAB — APTT: aPTT: 27 s (ref 24–36)

## 2024-05-24 NOTE — ED Triage Notes (Signed)
 [facility] from [facility] [place] . Initially called out for a fall. Patient had unwitnessed fall ; might have been putting on his shoes when he fell over and hit his head.  Patient was alert and oriented when they arrived   But when they stood him up to get on the stretcher he became confused and not talking coherently. Which is intermittent.  This occurred at 2200. Code stroke called by ems at 2207.  211/60 88HR 99% on RA.   Cbg 75  Arrival time 2212 Push button 2211 Cleared for CT 2215 Neurologist on screen 2224

## 2024-05-24 NOTE — ED Provider Notes (Signed)
 " [place] EMERGENCY DEPARTMENT AT [facility] Provider Note   CSN: [id number] Arrival date & time: 05/24/24  2212     Patient presents with: Fall on Thinners and Code Stroke   Brandon Burke is a 89 y.o. male.   HPI  Presents because of fall.  Patient was a stroke alert on the way to the hospital.  According to EMS, they were called to patient's facility because of fall.  Patient is on Eliquis .  Reported fall.  Fall about 940.  EMS arrived around 10.  Patient was mentating well initially.  Subsequently had word finding difficulty for EMS so EMS subsequently called stroke alert.  In the ED, patient has no acute complains     Per Lavella Larve: Patient has has good days and bad days.  Advanced dementia.  Usually [name].  Currently, Acting Little Bit More Confused Than Baseline but Not Far off.  Previous medical history reviewed : Last seen in the ED in November 2005.  Was seen because of fall.  Negative workup at that time.  At that time that patient has poor overall general health with severe dementia.     Prior to Admission medications  Medication Sig Start Date End Date Taking? Authorizing Provider  acetaminophen  (TYLENOL ) 325 MG tablet Take 2 tablets (650 mg total) by mouth every 6 (six) hours as needed for mild pain. 02/25/21   Angiulli, Toribio PARAS, PA-C  amitriptyline  (ELAVIL ) 10 MG tablet Take 1 tablet (10 mg total) by mouth at bedtime. 04/21/23   Amon Aloysius BRAVO, MD  apixaban  (ELIQUIS ) 2.5 MG TABS tablet Take 1 tablet (2.5 mg total) by mouth 2 (two) times daily. 05/25/23   Amon Aloysius BRAVO, MD  ARTIFICIAL TEAR OP Place 1 drop into both eyes as needed (dry eyes).    [provider]  atenolol  (TENORMIN ) 25 MG tablet Take 1 tablet (25 mg total) by mouth daily. 07/22/23   Paz, Jose E, MD  azelastine  (ASTELIN ) 0.1 % nasal spray Place 2 sprays into both nostrils 2 (two) times daily. Use in each nostril as directed    [provider]   calcium  carbonate (TUMS - DOSED IN MG ELEMENTAL CALCIUM ) 500 MG chewable tablet Chew 1-2 tablets by mouth as needed for indigestion or heartburn.    [provider]  Cholecalciferol (VITAMIN D3) 25 MCG (1000 UT) CAPS Take 1 capsule (1,000 Units total) by mouth daily. 02/25/21   Angiulli, Toribio PARAS, PA-C  docusate sodium  (COLACE) 100 MG capsule Take 1 capsule (100 mg total) by mouth 2 (two) times daily. Patient not taking: Reported on 09/27/2023 02/25/21   Angiulli, Toribio PARAS, PA-C  donepezil  (ARICEPT ) 10 MG tablet Take 1 tablet (10 mg total) by mouth at bedtime. 05/25/23   Paz, Jose E, MD  dorzolamide -timolol  (COSOPT ) 2-0.5 % ophthalmic solution Place 1 drop into both eyes 2 (two) times daily. 10/12/23     escitalopram  (LEXAPRO ) 10 MG tablet Take 2 tablets (20 mg total) by mouth daily. 05/25/23   Paz, Jose E, MD  fluorouracil (EFUDEX) 5 % cream Apply topically 2 (two) times daily. Use as directed on scalp    [provider]  fluticasone  (FLONASE ) 50 MCG/ACT nasal spray Place 2 sprays into both nostrils daily as needed for allergies or rhinitis. 12/02/22   Paz, Jose E, MD  latanoprost  (XALATAN ) 0.005 % ophthalmic solution Place 1 drop into both eyes at bedtime. 06/24/23     levothyroxine  (SYNTHROID ) 50 MCG tablet  Take 1 tablet (50 mcg total) by mouth daily before breakfast. 05/25/23   Paz, Jose E, MD  MELATONIN PO Take 4 mg by mouth at bedtime.    [provider]  Multiple Vitamin (MULTIVITAMIN) tablet Take 1 tablet by mouth daily. Takes at lunch    [provider]  polyethylene glycol (MIRALAX  / GLYCOLAX ) 17 g packet Take 17 g by mouth daily. 02/25/21   Angiulli, Toribio PARAS, PA-C  pravastatin  (PRAVACHOL ) 40 MG tablet Take 1 tablet (40 mg total) by mouth at bedtime. 08/09/23   Amon Aloysius BRAVO, MD    Allergies: Sulfonamide derivatives and Nitrofuran derivatives    Review of Systems  Constitutional:  Negative for chills and fever.  HENT:  Negative for ear pain and sore throat.    Eyes:  Negative for pain and visual disturbance.  Respiratory:  Negative for cough and shortness of breath.   Cardiovascular:  Negative for chest pain and palpitations.  Gastrointestinal:  Negative for abdominal pain and vomiting.  Genitourinary:  Negative for dysuria and hematuria.  Musculoskeletal:  Negative for arthralgias and back pain.  Skin:  Negative for color change and rash.  Neurological:  Negative for seizures and syncope.  All other systems reviewed and are negative.   Updated Vital Signs BP (!) 181/76   Pulse 74   Resp 12   Ht 6' 1 (1.854 m)   Wt 77.6 kg   SpO2 94%   BMI 22.57 kg/m   Physical Exam Vitals and nursing note reviewed.  Constitutional:      General: He is not in acute distress.    Appearance: He is well-developed.  HENT:     Head: Normocephalic and atraumatic.  Eyes:     Conjunctiva/sclera: Conjunctivae normal.  Cardiovascular:     Rate and Rhythm: Normal rate and regular rhythm.     Heart sounds: No murmur heard. Pulmonary:     Effort: Pulmonary effort is normal. No respiratory distress.     Breath sounds: Normal breath sounds.  Abdominal:     Palpations: Abdomen is soft.     Tenderness: There is no abdominal tenderness.  Musculoskeletal:        General: No swelling.     Cervical back: Neck supple.  Skin:    General: Skin is warm and dry.     Capillary Refill: Capillary refill takes less than 2 seconds.  Neurological:     Mental Status: He is confused.     GCS: GCS eye subscore is 4. GCS verbal subscore is 4. GCS motor subscore is 6.     Cranial Nerves: Cranial nerves 2-12 are intact. No cranial nerve deficit.     Sensory: Sensation is intact.     Comments: Patient at neurobaseline per brother at bedside.  Psychiatric:        Mood and Affect: Mood normal.     (all labs ordered are listed, but only abnormal results are displayed) Labs Reviewed  CBC WITH DIFFERENTIAL/PLATELET  PROTIME-INR  APTT  COMPREHENSIVE METABOLIC PANEL WITH  GFR  URINALYSIS, ROUTINE W REFLEX MICROSCOPIC    EKG: EKG Interpretation Date/Time:  Thursday May 24 2024 22:28:21 EST Ventricular Rate:  80 PR Interval:  158 QRS Duration:  93 QT Interval:  396 QTC Calculation: 457 R Axis:   -11  Text Interpretation: Sinus rhythm Atrial premature complex Confirmed by Simon Rea [phone]) on 05/24/2024 10:38:37 PM  Radiology: CT HEAD CODE STROKE WO CONTRAST` Result Date: 05/24/2024 EXAM: CT HEAD WITHOUT CONTRAST 05/24/2024 10:43:56  PM TECHNIQUE: CT of the head was performed without the administration of intravenous contrast. Automated exposure control, iterative reconstruction, and/or weight based adjustment of the mA/kV was utilized to reduce the radiation dose to as low as reasonably achievable. COMPARISON: CT Head Oct 1, 25 CLINICAL HISTORY: Fall. FINDINGS: BRAIN AND VENTRICLES: No acute hemorrhage. No evidence of acute infarct. No hydrocephalus. No extra-axial collection. No mass effect or midline shift. Remote cerebellar, left frontoparietal, and bilateral basal ganglia infarcts. Modertae patchy white matter hypodensities, compatible with chronic microvascular ischemic change. ORBITS: No acute abnormality. Remote left medial orbital wall fracture. SINUSES: Opacified left ethmoid air cells. SOFT TISSUES AND SKULL: High left scalp contusion. No skull fracture. IMPRESSION: 1. No acute intracranial abnormality. ASPECTS 10. 2. Moderate chronic microvascular ischemic change and multiple remote infarcts. 3. High left scalp contusion. Electronically signed by: Glendia Molt MD 05/24/2024 11:08 PM EST RP Workstation: [id number]   DG Chest Portable 1 View Result Date: 05/24/2024 EXAM: 1 VIEW(S) XRAY OF THE CHEST 05/24/2024 11:00:00 PM COMPARISON: 02/01/2024 CLINICAL HISTORY: fall, evaluate for pneumonia. FINDINGS: LUNGS AND PLEURA: Stable nodular opacity in the right lower lung, reflecting right middle lobe scarring from prior CT. Stable scarring at the left lung  base. Low lung volumes with mild basilar atelectasis. No pleural effusion. No pneumothorax. HEART AND MEDIASTINUM: No acute abnormality of the cardiac and mediastinal silhouettes. BONES AND SOFT TISSUES: Old left rib fractures. IMPRESSION: 1. No acute findings. Electronically signed by: Pinkie Pebbles MD 05/24/2024 11:05 PM EST RP Workstation: [id number]   CT Cervical Spine Wo Contrast Result Date: 05/24/2024 CLINICAL DATA:  Unwitnessed fall EXAM: CT CERVICAL SPINE WITHOUT CONTRAST TECHNIQUE: Multidetector CT imaging of the cervical spine was performed without intravenous contrast. Multiplanar CT image reconstructions were also generated. RADIATION DOSE REDUCTION: This exam was performed according to the departmental dose-optimization program which includes automated exposure control, adjustment of the mA and/or kV according to patient size and/or use of iterative reconstruction technique. COMPARISON:  CT 02/01/2024 FINDINGS: Alignment: Trace anterolisthesis C2 on C3 and C4 on C5, unchanged. Straightening of the cervical spine. Facet alignment is normal Skull base and vertebrae: No acute fracture. No primary bone lesion or focal pathologic process. Soft tissues and spinal canal: No prevertebral fluid or swelling. No visible canal hematoma. Disc levels: Multilevel degenerative changes. Advanced disc space narrowing C3-C4, C5-C6 and C6-C7. Multilevel facet degenerative change with foraminal narrowing. No high-grade bony canal stenosis Upper chest: Negative. Other: None IMPRESSION: Straightening of the cervical spine with multilevel degenerative changes. No acute osseous abnormality. Electronically Signed   By: Luke Bun M.D.   On: 05/24/2024 22:51     Procedures   Medications Ordered in the ED - No data to display                                  Medical Decision Making Amount and/or Complexity of Data Reviewed Labs: ordered. Radiology: ordered.     HPI:  Presents because of fall.  Patient  was a stroke alert on the way to the hospital.  According to EMS, they were called to patient's facility because of fall.  Patient is on Eliquis .  Reported fall.  Fall about 940.  EMS arrived around 10.  Patient was mentating well initially.  Subsequently had word finding difficulty for EMS so EMS subsequently called stroke alert.  In the ED, patient has no acute complains    Per Lavella Larve: Patient has  has good days and bad days.  Advanced dementia.  Usually [name].  Currently, Acting Little Bit More Confused Than Baseline but Not Far off.  Previous medical history reviewed : Last seen in the ED in November 2005.  Was seen because of fall.  Negative workup at that time.  At that time that patient has poor overall general health with severe dementia.  MDM:   Upon examination, patient hemodynamically stable.  Alert to self.  Not time or place.  This seems to be around patient's baseline according to brother.  Generalized weakness in upper and lower extremities against gravity but symmetrical.  Cranials 2 through 12 intact.  No focal deficits of the face second appreciated.  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   There is question whether or not patient still on Eliquis .  Per chart review, he was cautioned to stop the Eliquis  last time he was in the ED.  Brother thinks they stopped the medication as well.    Reevaluation:   Neurology saw the patient.  No further recommendation.  No indication for MRI brain.  No concerns for CVA.  CT head CT cervical spine benign.  No acute fracture.  Patient does have a area on the top of his scalp where he had a biopsy.  Some slight bleeding to the area.  No indication for suturing or stapling of this area.  Pending CMP as well as UA.  Will evaluate for any kind of large electrolyte derangements or UA that  shows UTI.  Per brother, sometimes he is confused he does have a UTI.  Patient be signed out in stable condition.  Plan for discharge back to facility.  EKG Interpreted by Me: sinus    Cardiac Tele Interpreted by Me: sinus    I have independently interpreted the CXR  and CT  images and agree with the radiologist finding   Social Determinant of Health: dementia    Disposition and Follow Up: signed out       Final diagnoses:  Fall, initial encounter  Confusion    ED Discharge Orders     None          Simon Lavonia SAILOR, MD 05/24/24 2317  "

## 2024-05-24 NOTE — Consult Note (Signed)
 [facility] Tele[facility]rology Consult Services   Patient Name:   Brandon Burke, Brandon Burke Date of Birth:   [birth year] Identification Number:   MRN - [id number] Date of Service:   05/24/2024 22:12:30  Diagnosis:       W19.CHERENE - Fall  Impression:       89 year old man with HLD, hypothyroidism, OSA, severe dementia, DVT on eliquis , and hx of TBI for whom neurology is consulted for evaluation of stroke. LKW 2130 with symptoms of fall. There was reported change in mental status which prompted SA activation, but it seems patients current level of confusion is actually his baseline. There are no focal deficits appreciated on his examination. NCCT Head without any obvious intracranial pathology, but left frontal scalp injury noted. Overall, lower suspicion for acute ischemic stroke at this time. From history, falls seems mechanical (tripped while putting on shoes). I think so long as patient remains at baseline, no further need for additional neurologic workup. Would consider holding patients eliquis  outpatient as it seems he has history of falls with head injury.    ------------------------------------------------------------------------------  Advanced Imaging: Advanced Imaging Deferred because:  Stroke not suspected with clinical presentation and exam   Metrics: Last Known Well: 05/24/2024 21:30:00 Arrival Time: 05/24/2024 22:12:00 Activation Time: 05/24/2024 22:12:30 Initial Response Time: 05/24/2024 22:24:29 Symptoms: Fall. Initial patient interaction: 05/24/2024 22:26:30 NIHSS Assessment Completed: 05/24/2024 22:32:11 Patient is not a candidate for Thrombolytic. Thrombolytic Medical Decision: 05/24/2024 22:32:12 Patient was not deemed candidate for Thrombolytic because of following reasons: Use of NOAC in last 48 hrs. . Significant head trauma or stroke in previous 3 months .  CT Head: I personally reviewed all the CT images that were available to me and it  showed: No hemorrhage or obvious acute ischemia  Primary Provider Notified of Diagnostic Impression and Management Plan on: 05/24/2024 22:51:12    ------------------------------------------------------------------------------  History of Present Illness: Patient is a 89 year old Male.  Patient was brought by EMS for symptoms of Fall. Brandon Burke is a 89 year old man with HLD, hypothyroidism, OSA, severe dementia, DVT on eliquis , and hx of TBI who presents to ED after a fall. LKW around 2130 when he suffered an unwitnessed fall. He was found shortly after by NH staff (memory care unit) noted to be at his neurologic baseline. EMS was summoned and noted by them to be alert and oriented x4 on arrival. However, enroute patient noted to be very confused so stroke alert was activated. Patient's brother is at bedside and explains that at baseline he is typically this confused and usually orients to self only and has trouble following commands or having a normal conversation.    Past Medical History:      Hyperlipidemia       Dementia/MCI Other PMH:  DVT  TBI  Medications:  Anticoagulant use:  Yes Eliquis  No Antiplatelet use Reviewed EMR for current medications  Allergies:  Reviewed  Social History: Drug Use: No  Family History:  There is no family history of premature cerebrovascular disease pertinent to this consultation  ROS : 14 Points Review of Systems was performed and was negative except mentioned in HPI.  Past Surgical History: There Is No Surgical History Contributory To Todays Visit    Examination: BP(211/69), Pulse(88), Blood Glucose(75) 1A: Level of Consciousness - Arouses to minor stimulation + 1 1B: Ask Month and Age - Could Not Answer Either Question Correctly + 2 1C: Blink Eyes & Squeeze Hands - Performs 1 Task + 1 2: Test  Horizontal Extraocular Movements - Normal + 0 3: Test Visual Fields - No Visual Loss + 0 4: Test Facial Palsy (Use Grimace if  Obtunded) - Normal symmetry + 0 5A: Test Left Arm Motor Drift - No Drift for 10 Seconds + 0 5B: Test Right Arm Motor Drift - No Drift for 10 Seconds + 0 6A: Test Left Leg Motor Drift - No Drift for 5 Seconds + 0 6B: Test Right Leg Motor Drift - No Drift for 5 Seconds + 0 7: Test Limb Ataxia (FNF/Heel-Shin) - No Ataxia + 0 8: Test Sensation - Normal; No sensory loss + 0 9: Test Language/Aphasia - Mild-Moderate Aphasia: Some Obvious Changes, Without Significant Limitation + 1 10: Test Dysarthria - Mild-Moderate Dysarthria: Slurring but can be understood + 1 11: Test Extinction/Inattention - No abnormality + 0  NIHSS Score: 6   Pre-Morbid Modified Rankin Scale: 0 Points = No symptoms at all  Spoke with : ED Attending MD I reviewed the available imaging via Rapid and initiated discussion with the primary provider  This consult was conducted in real time using interactive audio and immunologist. Patient was informed of the technology being used for this visit and agreed to proceed. Patient located in hospital and provider located at home/office setting.   Patient is being evaluated for possible acute neurologic impairment and high probability of imminent or life-threatening deterioration. I spent total of 35 minutes providing care to this patient, including time for face to face visit via telemedicine, review of medical records, imaging studies and discussion of findings with providers, the patient and/or family.    Dr Aloha Coppersmith   [facility] For Inpatient follow-up with [facility] physician please call [facility] at [phone]. As we are not an outpatient service for any post hospital discharge needs please contact the hospital for assistance. If you have any questions for the [facility] physicians or need to reconsult for clinical or diagnostic changes please contact us  via [facility] at [phone].  Non-radiologist review of imaging performed to assist with emergent  clinical decision-making. Remote physician workstations do not possess the same resolution, calibration, or diagnostic capabilities as hospital-based radiology reading stations, and formal radiologist read is necessary.   Signature : Aloha Coppersmith

## 2024-05-24 NOTE — Discharge Instructions (Addendum)
 Your imaging workup showed no acute evidence of bleed.  Please take it easy while ambulating

## 2024-05-25 LAB — URINALYSIS, ROUTINE W REFLEX MICROSCOPIC
Bilirubin Urine: NEGATIVE
Glucose, UA: NEGATIVE mg/dL
Hgb urine dipstick: NEGATIVE
Ketones, ur: NEGATIVE mg/dL
Nitrite: NEGATIVE
Protein, ur: 30 mg/dL — AB
Specific Gravity, Urine: 1.017 (ref 1.005–1.030)
pH: 6 (ref 5.0–8.0)

## 2024-05-25 NOTE — ED Provider Notes (Signed)
 I assumed care at signout. Plan was to follow-up on urinalysis.  Urinalysis does not show any convincing signs of UTI Patient is awake alert no acute distress.  He moves all 4 extremities.  He has no chest pain, no abdominal tenderness. Abrasions to arms, but no signs of traumatic injuries  Brother prefers to take patient back to facility. This appears to be a reasonable plan.   Midge Golas, MD 05/25/24 [phone]

## 2024-06-01 ENCOUNTER — Observation Stay (HOSPITAL_COMMUNITY)
Admission: EM | Admit: 2024-06-01 | Source: Skilled Nursing Facility | Attending: Family Medicine | Admitting: Family Medicine

## 2024-06-01 ENCOUNTER — Emergency Department (HOSPITAL_COMMUNITY)

## 2024-06-01 ENCOUNTER — Other Ambulatory Visit: Payer: Self-pay

## 2024-06-01 ENCOUNTER — Observation Stay (HOSPITAL_COMMUNITY)

## 2024-06-01 ENCOUNTER — Encounter (HOSPITAL_COMMUNITY): Payer: Self-pay | Admitting: Emergency Medicine

## 2024-06-01 DIAGNOSIS — G9341 Metabolic encephalopathy: Secondary | ICD-10-CM | POA: Diagnosis not present

## 2024-06-01 DIAGNOSIS — E039 Hypothyroidism, unspecified: Secondary | ICD-10-CM | POA: Diagnosis present

## 2024-06-01 DIAGNOSIS — F0390 Unspecified dementia without behavioral disturbance: Secondary | ICD-10-CM

## 2024-06-01 DIAGNOSIS — F03B0 Unspecified dementia, moderate, without behavioral disturbance, psychotic disturbance, mood disturbance, and anxiety: Secondary | ICD-10-CM | POA: Diagnosis present

## 2024-06-01 DIAGNOSIS — R569 Unspecified convulsions: Principal | ICD-10-CM

## 2024-06-01 DIAGNOSIS — G4733 Obstructive sleep apnea (adult) (pediatric): Secondary | ICD-10-CM | POA: Diagnosis present

## 2024-06-01 DIAGNOSIS — F339 Major depressive disorder, recurrent, unspecified: Secondary | ICD-10-CM | POA: Diagnosis present

## 2024-06-01 DIAGNOSIS — Z8546 Personal history of malignant neoplasm of prostate: Secondary | ICD-10-CM

## 2024-06-01 DIAGNOSIS — Z86711 Personal history of pulmonary embolism: Secondary | ICD-10-CM | POA: Diagnosis present

## 2024-06-01 DIAGNOSIS — E785 Hyperlipidemia, unspecified: Secondary | ICD-10-CM | POA: Diagnosis present

## 2024-06-01 LAB — URINALYSIS, ROUTINE W REFLEX MICROSCOPIC
Bacteria, UA: NONE SEEN
Bilirubin Urine: NEGATIVE
Glucose, UA: NEGATIVE mg/dL
Hgb urine dipstick: NEGATIVE
Ketones, ur: NEGATIVE mg/dL
Leukocytes,Ua: NEGATIVE
Nitrite: NEGATIVE
Protein, ur: 30 mg/dL — AB
Specific Gravity, Urine: 1.017 (ref 1.005–1.030)
pH: 7 (ref 5.0–8.0)

## 2024-06-01 LAB — COMPREHENSIVE METABOLIC PANEL WITH GFR
ALT: 15 U/L (ref 0–44)
AST: 24 U/L (ref 15–41)
Albumin: 4.1 g/dL (ref 3.5–5.0)
Alkaline Phosphatase: 101 U/L (ref 38–126)
Anion gap: 16 — ABNORMAL HIGH (ref 5–15)
BUN: 18 mg/dL (ref 8–23)
CO2: 22 mmol/L (ref 22–32)
Calcium: 9 mg/dL (ref 8.9–10.3)
Chloride: 104 mmol/L (ref 98–111)
Creatinine, Ser: 1.16 mg/dL (ref 0.61–1.24)
GFR, Estimated: 60 mL/min — ABNORMAL LOW
Glucose, Bld: 109 mg/dL — ABNORMAL HIGH (ref 70–99)
Potassium: 4.2 mmol/L (ref 3.5–5.1)
Sodium: 143 mmol/L (ref 135–145)
Total Bilirubin: 0.5 mg/dL (ref 0.0–1.2)
Total Protein: 6.9 g/dL (ref 6.5–8.1)

## 2024-06-01 LAB — CBC WITH DIFFERENTIAL/PLATELET
Abs Immature Granulocytes: 0.02 10*3/uL (ref 0.00–0.07)
Basophils Absolute: 0 10*3/uL (ref 0.0–0.1)
Basophils Relative: 1 %
Eosinophils Absolute: 0.2 10*3/uL (ref 0.0–0.5)
Eosinophils Relative: 3 %
HCT: 43.7 % (ref 39.0–52.0)
Hemoglobin: 14.1 g/dL (ref 13.0–17.0)
Immature Granulocytes: 0 %
Lymphocytes Relative: 11 %
Lymphs Abs: 0.6 10*3/uL — ABNORMAL LOW (ref 0.7–4.0)
MCH: 32.7 pg (ref 26.0–34.0)
MCHC: 32.3 g/dL (ref 30.0–36.0)
MCV: 101.4 fL — ABNORMAL HIGH (ref 80.0–100.0)
Monocytes Absolute: 0.5 10*3/uL (ref 0.1–1.0)
Monocytes Relative: 9 %
Neutro Abs: 4.3 10*3/uL (ref 1.7–7.7)
Neutrophils Relative %: 76 %
Platelets: 173 10*3/uL (ref 150–400)
RBC: 4.31 MIL/uL (ref 4.22–5.81)
RDW: 12 % (ref 11.5–15.5)
WBC: 5.6 10*3/uL (ref 4.0–10.5)
nRBC: 0 % (ref 0.0–0.2)

## 2024-06-01 LAB — TROPONIN T, HIGH SENSITIVITY
Troponin T High Sensitivity: 18 ng/L (ref 0–19)
Troponin T High Sensitivity: 25 ng/L — ABNORMAL HIGH (ref 0–19)

## 2024-06-01 LAB — TSH: TSH: 1.66 u[IU]/mL (ref 0.350–4.500)

## 2024-06-01 MED ORDER — ACETAMINOPHEN 325 MG PO TABS
650.0000 mg | ORAL_TABLET | Freq: Four times a day (QID) | ORAL | Status: AC | PRN
  Administered 2024-06-06: 650 mg via ORAL
  Filled 2024-06-01: qty 2

## 2024-06-01 MED ORDER — ACETAMINOPHEN 650 MG RE SUPP: 650.0000 mg | Freq: Four times a day (QID) | RECTAL | Status: AC | PRN

## 2024-06-01 MED ORDER — SODIUM CHLORIDE 0.45 % IV SOLN: INTRAVENOUS | Status: DC

## 2024-06-01 MED ORDER — GADOBUTROL 1 MMOL/ML IV SOLN
7.5000 mL | Freq: Once | INTRAVENOUS | Status: AC | PRN
  Administered 2024-06-01: 7.5 mL via INTRAVENOUS

## 2024-06-01 NOTE — ED Provider Notes (Signed)
 " [place] EMERGENCY DEPARTMENT AT [facility] Provider Note   CSN: [id number] Arrival date & time: 06/01/24  9084     Patient presents with: Seizures   Brandon Burke is a 89 y.o. male.  {Add pertinent medical, surgical, social history, OB history to YEP:67052} Patient has severe dementia.  He was recently started on Vicodin and Vistaril.  Patient had a seizure today.  Patient does not have a history of seizures   Seizures      Prior to Admission medications  Medication Sig Start Date End Date Taking? Authorizing Provider  acetaminophen  (TYLENOL ) 325 MG tablet Take 2 tablets (650 mg total) by mouth every 6 (six) hours as needed for mild pain. 02/25/21   Angiulli, Toribio PARAS, PA-C  amitriptyline  (ELAVIL ) 10 MG tablet Take 1 tablet (10 mg total) by mouth at bedtime. 04/21/23   Amon Aloysius BRAVO, MD  apixaban  (ELIQUIS ) 2.5 MG TABS tablet Take 1 tablet (2.5 mg total) by mouth 2 (two) times daily. 05/25/23   Amon Aloysius BRAVO, MD  ARTIFICIAL TEAR OP Place 1 drop into both eyes as needed (dry eyes).    [provider]  atenolol  (TENORMIN ) 25 MG tablet Take 1 tablet (25 mg total) by mouth daily. 07/22/23   Paz, Jose E, MD  azelastine  (ASTELIN ) 0.1 % nasal spray Place 2 sprays into both nostrils 2 (two) times daily. Use in each nostril as directed    [provider]  calcium  carbonate (TUMS - DOSED IN MG ELEMENTAL CALCIUM ) 500 MG chewable tablet Chew 1-2 tablets by mouth as needed for indigestion or heartburn.    [provider]  Cholecalciferol (VITAMIN D3) 25 MCG (1000 UT) CAPS Take 1 capsule (1,000 Units total) by mouth daily. 02/25/21   Angiulli, Toribio PARAS, PA-C  docusate sodium  (COLACE) 100 MG capsule Take 1 capsule (100 mg total) by mouth 2 (two) times daily. Patient not taking: Reported on 09/27/2023 02/25/21   Pegge Toribio PARAS, PA-C  donepezil  (ARICEPT ) 10 MG tablet Take 1 tablet (10 mg total) by mouth at bedtime. 05/25/23   Paz, Jose E, MD   dorzolamide -timolol  (COSOPT ) 2-0.5 % ophthalmic solution Place 1 drop into both eyes 2 (two) times daily. 10/12/23     escitalopram  (LEXAPRO ) 10 MG tablet Take 2 tablets (20 mg total) by mouth daily. 05/25/23   Paz, Jose E, MD  fluorouracil (EFUDEX) 5 % cream Apply topically 2 (two) times daily. Use as directed on scalp    [provider]  fluticasone  (FLONASE ) 50 MCG/ACT nasal spray Place 2 sprays into both nostrils daily as needed for allergies or rhinitis. 12/02/22   Paz, Jose E, MD  latanoprost  (XALATAN ) 0.005 % ophthalmic solution Place 1 drop into both eyes at bedtime. 06/24/23     levothyroxine  (SYNTHROID ) 50 MCG tablet Take 1 tablet (50 mcg total) by mouth daily before breakfast. 05/25/23   Paz, Jose E, MD  MELATONIN PO Take 4 mg by mouth at bedtime.    [provider]  Multiple Vitamin (MULTIVITAMIN) tablet Take 1 tablet by mouth daily. Takes at lunch    [provider]  polyethylene glycol (MIRALAX  / GLYCOLAX ) 17 g packet Take 17 g by mouth daily. 02/25/21   Angiulli, Toribio PARAS, PA-C  pravastatin  (PRAVACHOL ) 40 MG tablet Take 1 tablet (40 mg total) by mouth at bedtime. 08/09/23   Amon Aloysius BRAVO, MD    Allergies: Sulfonamide derivatives and Nitrofuran derivatives    Review of Systems  Neurological:  Positive for  seizures.    Updated Vital Signs BP 138/68   Pulse 87   Temp 98.1 F (36.7 C) (Oral)   Resp 19   Ht 6' 1 (1.854 m)   Wt 78 kg   SpO2 94%   BMI 22.69 kg/m   Physical Exam  (all labs ordered are listed, but only abnormal results are displayed) Labs Reviewed  COMPREHENSIVE METABOLIC PANEL WITH GFR - Abnormal; Notable for the following components:      Result Value   Glucose, Bld 109 (*)    GFR, Estimated 60 (*)    Anion gap 16 (*)    All other components within normal limits  CBC WITH DIFFERENTIAL/PLATELET - Abnormal; Notable for the following components:   MCV 101.4 (*)    Lymphs Abs 0.6 (*)    All other components within normal limits   URINALYSIS, ROUTINE W REFLEX MICROSCOPIC - Abnormal; Notable for the following components:   Protein, ur 30 (*)    All other components within normal limits  TROPONIN T, HIGH SENSITIVITY - Abnormal; Notable for the following components:   Troponin T High Sensitivity 25 (*)    All other components within normal limits  TROPONIN T, HIGH SENSITIVITY    EKG: EKG Interpretation Date/Time:  Friday June 01 2024 09:32:55 EST Ventricular Rate:  90 PR Interval:  143 QRS Duration:  94 QT Interval:  394 QTC Calculation: 483 R Axis:   -12  Text Interpretation: Sinus rhythm Minimal ST depression, inferior leads Borderline prolonged QT interval Confirmed by Suzette Pac [phone]) on 06/01/2024 1:43:12 PM  Radiology: CT Head Wo Contrast Result Date: 06/01/2024 EXAM: CT HEAD WITHOUT CONTRAST 06/01/2024 10:43:52 AM TECHNIQUE: CT of the head was performed without the administration of intravenous contrast. Automated exposure control, iterative reconstruction, and/or weight based adjustment of the mA/kV was utilized to reduce the radiation dose to as low as reasonably achievable. Exam quality is diminished due to motion artifact. COMPARISON: None available. CLINICAL HISTORY: Seizure, new-onset, no history of trauma. New-onset seizure; no history of trauma. FINDINGS: BRAIN AND VENTRICLES: No acute hemorrhage. No evidence of acute infarct. Hypoattenuating foci in the cerebral white matter, most likely representing chronic small vessel disease. Remote bilateral basal ganglia lacunar infarcts with ex vacuo dilatation of the anterior horn of the right lateral ventricle. Prominence of the sulci and ventricles compatible with brain atrophy. No hydrocephalus. No extra-axial collection. No mass effect or midline shift. ORBITS: No acute abnormality. SINUSES: Mucosal thickening involving the ethmoid air cells and left maxillary sinus. No sinus air-fluid levels. SOFT TISSUES AND SKULL: Large scalp hematoma overlying the  left lateral convexity measuring 2.7 x 1.6 cm, axial image 29/10. No acute soft tissue abnormality (other than the hematoma). No skull fracture. IMPRESSION: 1. Exam quality is diminished due to motion artifact. 2. No acute intracranial abnormality related to the new-onset seizure. 3. Chronic small vessel ischemic changes and brain atrophy. 4. Large scalp hematoma overlying the left lateral convexity measuring 2.7 x 1.6 cm, likely related to the reported seizure. Electronically signed by: Waddell Calk MD 06/01/2024 10:53 AM EST RP Workstation: [id number]    {Document cardiac monitor, telemetry assessment procedure when appropriate:32947} Procedures   Medications Ordered in the ED - No data to display   I spoke with Dr. Germaine neurology and she recommended an MRI of the brain and to hold the Vicodin and Vistaril and see if he improves.  If he has no seizure he should be placed on antiepileptics {Click here for ABCD2, HEART and  other calculators REFRESH Note before signing:1}                              Medical Decision Making Amount and/or Complexity of Data Reviewed Labs: ordered. Radiology: ordered. ECG/medicine tests: ordered.  Risk Decision regarding hospitalization.   Seizure  {Document critical care time when appropriate  Document review of labs and clinical decision tools ie CHADS2VASC2, etc  Document your independent review of radiology images and any outside records  Document your discussion with family members, caretakers and with consultants  Document social determinants of health affecting pt's care  Document your decision making why or why not admission, treatments were needed:32947:::1}   Final diagnoses:  Seizure [facility])    ED Discharge Orders     None        "

## 2024-06-01 NOTE — ED Notes (Signed)
 Notified CCMD for monitoring of pt's room monitor.

## 2024-06-01 NOTE — Consult Note (Signed)
 NEUROLOGY CONSULT NOTE   Date of service: June 01, 2024 Patient Name: Brandon Burke MRN:  [id number] DOB:  Jun 25, 1931 Chief Complaint: confusion, seizures, post ictal and not back to baseline Requesting Provider: Bryn Bernardino NOVAK, MD  History of Present Illness  Brandon Burke is a 89 y.o. male with hx of dementia, HTN, HLD, Osa, PE but not on AC 2/2 falls, hypothyroidism who lives in a facility and was sent to the ED after he had episode of seizure like activity.  ***  LKW: *** Modified rankin score: {Modified Rankin Scale:21264} IV Thrombolysis: ***Yes, *** No (reason) EVT: ***Yes, *** No (reason) ICH Score:***  NIHSS components Score: Comment  1a Level of Conscious 0[]  1[]  2[]  3[]      1b LOC Questions 0[]  1[]  2[]       1c LOC Commands 0[]  1[]  2[]       2 Best Gaze 0[]  1[]  2[]       3 Visual 0[]  1[]  2[]  3[]      4 Facial Palsy 0[]  1[]  2[]  3[]      5a Motor Arm - left 0[]  1[]  2[]  3[]  4[]  UN[]    5b Motor Arm - Right 0[]  1[]  2[]  3[]  4[]  UN[]    6a Motor Leg - Left 0[]  1[]  2[]  3[]  4[]  UN[]    6b Motor Leg - Right 0[]  1[]  2[]  3[]  4[]  UN[]    7 Limb Ataxia 0[]  1[]  2[]  UN[]      8 Sensory 0[]  1[]  2[]  UN[]      9 Best Language 0[]  1[]  2[]  3[]      10 Dysarthria 0[]  1[]  2[]  UN[]      11 Extinct. and Inattention 0[]  1[]  2[]       TOTAL:       ROS  ***Comprehensive ROS performed and pertinent positives documented in HPI  ***Unable to ascertain due to ***  Past History   Past Medical History:  Diagnosis Date   Constipation    DVT (deep venous thrombosis) (HCC)    hx of 2001 (after prostate surgery)     Glaucoma    Hyperlipidemia    Hypertension    Melanoma (HCC)    face, hand   Mitral valve prolapse    OSA (obstructive sleep apnea) 11/26/2020   PE (pulmonary embolism)    5-09:was referred to hematology, and they recommend to discontinue Coumadin 5/11   Prostate cancer (HCC)     released from urology in 2010,needs yearly PSAs with PCP   Seasonal allergies     Past  Surgical History:  Procedure Laterality Date   CATARACT EXTRACTION Bilateral    EYE SURGERY Bilateral    Cat Sx   PARS PLANA VITRECTOMY Left 09/06/2019   Procedure: PARS PLANA VITRECTOMY WITH 25 GAUGE WITH INTRAVITREAL ANTIBIOTICS;  Surgeon: Valdemar Rogue, MD;  Location: [facility] OR;  Service: Ophthalmology;  Laterality: Left;   PHOTOCOAGULATION WITH LASER Left 09/06/2019   Procedure: Photocoagulation With Laser;  Surgeon: Valdemar Rogue, MD;  Location: [facility] OR;  Service: Ophthalmology;  Laterality: Left;   PROSTATECTOMY  2000   RUPTURED GLOBE EXPLORATION AND REPAIR Left 08/16/2019   Procedure: OPEN GLOBE EXPLORATION EYE POSSIBLE ANTERIOR  CHAMBER WASH OUT;  Surgeon: Marcey Elspeth PARAS, MD;  Location: [facility] OR;  Service: Ophthalmology;  Laterality: Left;    Family History: Family History  Problem Relation Age of Onset   CAD Father 60   Hypertension Father    Glaucoma Mother    Diabetes Neg Hx    Colon cancer Neg Hx    Prostate cancer  Neg Hx     Social History  reports that he has never smoked. He has never used smokeless tobacco. He reports that he does not drink alcohol  and does not use drugs.  Allergies[1]  Medications  Current Medications[2]  Vitals   Vitals:   08-Jun-2024 1800 06/08/2024 1815 Jun 08, 2024 1830 2024-06-08 2146  BP: (!) 154/59 (!) 153/59 (!) 156/65 (!) 161/66  Pulse: 79 78 76 77  Resp: 17 18 (!) 21 18  Temp:    98.4 F (36.9 C)  TempSrc:    Oral  SpO2: 99% 100% 94% 94%  Weight:      Height:        Body mass index is 22.69 kg/m.   Physical Exam   Constitutional: Appears well-developed and well-nourished. *** Psych: Affect appropriate to situation. *** Eyes: No scleral injection. *** HENT: No OP obstruction. *** Head: Normocephalic. *** Cardiovascular: Normal rate and regular rhythm. *** Respiratory: Effort normal, non-labored breathing. *** GI: Soft.  No distension. There is no tenderness. *** Skin: WDI. ***  Neurologic Examination    ***  Labs/Imaging/Neurodiagnostic studies   CBC:  Recent Labs  Lab 2024/06/08 1012  WBC 5.6  NEUTROABS 4.3  HGB 14.1  HCT 43.7  MCV 101.4*  PLT 173   Basic Metabolic Panel:  Lab Results  Component Value Date   NA 143 2024-06-08   K 4.2 Jun 08, 2024   CO2 22 06/08/24   GLUCOSE 109 (H) 2024/06/08   BUN 18 2024-06-08   CREATININE 1.16 06/08/2024   CALCIUM  9.0 Jun 08, 2024   GFRNONAA 60 (L) 2024-06-08   GFRAA >60 08/09/2019   Lipid Panel:  Lab Results  Component Value Date   LDLCALC 60 02/22/2023   HgbA1c: No results found for: HGBA1C Urine Drug Screen: No results found for: LABOPIA, COCAINSCRNUR, LABBENZ, AMPHETMU, THCU, LABBARB  Alcohol  Level     Component Value Date/Time   ETH <10 02/03/2021 1608   INR  Lab Results  Component Value Date   INR 0.9 05/24/2024   APTT  Lab Results  Component Value Date   APTT 27 05/24/2024   AED levels: No results found for: PHENYTOIN, ZONISAMIDE, LAMOTRIGINE, LEVETIRACETA  CT Head without contrast(Personally reviewed): ***  CT angio Head and Neck with contrast(Personally reviewed): ***  MR Angio head without contrast and Carotid Duplex BL(Personally reviewed): ***  MRI Brain(Personally reviewed): ***  Neurodiagnostics rEEG:  ***  ASSESSMENT   Brandon Burke is a 89 y.o. male ***  RECOMMENDATIONS  *** ______________________________________________________________________    Signed, Onetha Gaffey, MD [facility]     [1]  Allergies Allergen Reactions   Sulfonamide Derivatives Nausea And Vomiting   Nitrofuran Derivatives Other (See Comments)    Caused headaches   [2]  Current Facility-Administered Medications:    0.45 % sodium chloride  infusion, , Intravenous, Continuous, Bryn, Ryan B, MD   acetaminophen  (TYLENOL ) tablet 650 mg, 650 mg, Oral, Q6H PRN **OR** acetaminophen  (TYLENOL ) suppository 650 mg, 650 mg, Rectal, Q6H PRN, Bryn Bernardino NOVAK, MD

## 2024-06-01 NOTE — ED Notes (Addendum)
 Patient transported to MRI

## 2024-06-01 NOTE — ED Triage Notes (Signed)
 Pt to ER via EMS from [facility].  Pt was witnessed by staff sitting in chair with grand mal seizure like activity.  Pt has no hx of same. Per EMS pt was post ictal, unsure of pt's baseline.  Pt arrives alert, follows some commands.

## 2024-06-01 NOTE — H&P (Signed)
 " History and Physical    Patient: Brandon Burke [facility] FMW:[id number] DOB: 1932/04/28 DOA: 06/01/2024 DOS: the patient was seen and examined on 06/01/2024 PCP: Leontine Phebe Charlotte ONEIDA, NP  Patient coming from: [facility]  Chief Complaint:  Chief Complaint  Patient presents with   Seizures   HPI: ENDY EASTERLY is a 89 y.o. male with a history of dementia, HTN, HLD, OSA, history of PE no longer on anticoagulation due to recurrent falls, hypothyroidism, prostate CA s/p prostatectomy who presented from his living facility today due to concern for seizure activity.   Due to AMS and dementia, patient provides no history. Triage note reports grand mal seizure activity while sitting in a chair this morning. Per EMS, he was post ictal, but following some commands in the ED. His brother was called and told they were sending the patient to the ED because he was having a seizure. Of note, he was recently (1/28) started on standing vicodin dosing TID and prn hydroxyzine for anxiety or agitation.   Of note, his HCPOA Brother and SIL mention that there are times he demonstrates shaking-type activity either spontaneously or when seemingly overwhelmed. They wonder if the reported seizure activity could have been related to that. He has not returned to baseline, remains quite lethargic, and they have concern that he wouldn't be manageable back at the facility.   Work up in the ED included a motion-degraded head CT which showed a scalp hematoma, brain atrophy and small vessel disease without acute intracranial findings. UA negative and other labs reassuring. Due to failure to return to baseline, neurology was consulted, recommending admission to [facility] for formal evaluation, MRI brain, and EEG.   Review of Systems: unable to review all systems due to the inability of the patient to answer questions. Past Medical History:  Diagnosis Date   Constipation    DVT (deep venous thrombosis) (HCC)    hx of 2001  (after prostate surgery)     Glaucoma    Hyperlipidemia    Hypertension    Melanoma (HCC)    face, hand   Mitral valve prolapse    OSA (obstructive sleep apnea) 11/26/2020   PE (pulmonary embolism)    5-09:was referred to hematology, and they recommend to discontinue Coumadin 5/11   Prostate cancer (HCC)     released from urology in 2010,needs yearly PSAs with PCP   Seasonal allergies    Past Surgical History:  Procedure Laterality Date   CATARACT EXTRACTION Bilateral    EYE SURGERY Bilateral    Cat Sx   PARS PLANA VITRECTOMY Left 09/06/2019   Procedure: PARS PLANA VITRECTOMY WITH 25 GAUGE WITH INTRAVITREAL ANTIBIOTICS;  Surgeon: Valdemar Rogue, MD;  Location: [facility] OR;  Service: Ophthalmology;  Laterality: Left;   PHOTOCOAGULATION WITH LASER Left 09/06/2019   Procedure: Photocoagulation With Laser;  Surgeon: Valdemar Rogue, MD;  Location: [facility] OR;  Service: Ophthalmology;  Laterality: Left;   PROSTATECTOMY  2000   RUPTURED GLOBE EXPLORATION AND REPAIR Left 08/16/2019   Procedure: OPEN GLOBE EXPLORATION EYE POSSIBLE ANTERIOR  CHAMBER WASH OUT;  Surgeon: Marcey Elspeth PARAS, MD;  Location: [facility] OR;  Service: Ophthalmology;  Laterality: Left;   Social History:  reports that he has never smoked. He has never used smokeless tobacco. He reports that he does not drink alcohol  and does not use drugs.  Allergies[1]  Family History  Problem Relation Age of Onset   CAD Father 59   Hypertension Father    Glaucoma Mother  Diabetes Neg Hx    Colon cancer Neg Hx    Prostate cancer Neg Hx     Prior to Admission medications  Medication Sig Start Date End Date Taking? Authorizing Provider  acetaminophen  (TYLENOL ) 325 MG tablet Take 2 tablets (650 mg total) by mouth every 6 (six) hours as needed for mild pain. 02/25/21   Angiulli, Toribio PARAS, PA-C  amitriptyline  (ELAVIL ) 10 MG tablet Take 1 tablet (10 mg total) by mouth at bedtime. 04/21/23   Amon Aloysius BRAVO, MD  apixaban  (ELIQUIS ) 2.5 MG TABS tablet Take 1  tablet (2.5 mg total) by mouth 2 (two) times daily. 05/25/23   Amon Aloysius BRAVO, MD  ARTIFICIAL TEAR OP Place 1 drop into both eyes as needed (dry eyes).    [provider]  atenolol  (TENORMIN ) 25 MG tablet Take 1 tablet (25 mg total) by mouth daily. 07/22/23   Paz, Jose E, MD  azelastine  (ASTELIN ) 0.1 % nasal spray Place 2 sprays into both nostrils 2 (two) times daily. Use in each nostril as directed    [provider]  calcium  carbonate (TUMS - DOSED IN MG ELEMENTAL CALCIUM ) 500 MG chewable tablet Chew 1-2 tablets by mouth as needed for indigestion or heartburn.    [provider]  Cholecalciferol (VITAMIN D3) 25 MCG (1000 UT) CAPS Take 1 capsule (1,000 Units total) by mouth daily. 02/25/21   Angiulli, Toribio PARAS, PA-C  docusate sodium  (COLACE) 100 MG capsule Take 1 capsule (100 mg total) by mouth 2 (two) times daily. Patient not taking: Reported on 09/27/2023 02/25/21   Pegge Toribio PARAS, PA-C  donepezil  (ARICEPT ) 10 MG tablet Take 1 tablet (10 mg total) by mouth at bedtime. 05/25/23   Paz, Jose E, MD  dorzolamide -timolol  (COSOPT ) 2-0.5 % ophthalmic solution Place 1 drop into both eyes 2 (two) times daily. 10/12/23     escitalopram  (LEXAPRO ) 10 MG tablet Take 2 tablets (20 mg total) by mouth daily. 05/25/23   Paz, Jose E, MD  fluorouracil (EFUDEX) 5 % cream Apply topically 2 (two) times daily. Use as directed on scalp    [provider]  fluticasone  (FLONASE ) 50 MCG/ACT nasal spray Place 2 sprays into both nostrils daily as needed for allergies or rhinitis. 12/02/22   Paz, Jose E, MD  latanoprost  (XALATAN ) 0.005 % ophthalmic solution Place 1 drop into both eyes at bedtime. 06/24/23     levothyroxine  (SYNTHROID ) 50 MCG tablet Take 1 tablet (50 mcg total) by mouth daily before breakfast. 05/25/23   Paz, Jose E, MD  MELATONIN PO Take 4 mg by mouth at bedtime.    [provider]  Multiple Vitamin (MULTIVITAMIN) tablet Take 1 tablet by mouth daily. Takes at lunch     [provider]  polyethylene glycol (MIRALAX  / GLYCOLAX ) 17 g packet Take 17 g by mouth daily. 02/25/21   Angiulli, Toribio PARAS, PA-C  pravastatin  (PRAVACHOL ) 40 MG tablet Take 1 tablet (40 mg total) by mouth at bedtime. 08/09/23   Amon Aloysius BRAVO, MD    Physical Exam: Vitals:   06/01/24 1415 06/01/24 1445 06/01/24 1500 06/01/24 1530  BP: 138/68 (!) 152/63 (!) 156/64 133/61  Pulse: 87 88 83 72  Resp: 19 17 16 18   Temp:      TempSrc:      SpO2: 94% 95% 96% 95%  Weight:      Height:      Gen: Frail elderly male in no distress Pulm: Clear, nonlabored  CV: RRR, no MRG, distant S1S2, no  pitting edema.  GI: Soft, NT, ND, +BS Neuro: Lethargic but rouses to loud voice and touch. No verbal output. Did not follow commands, but spontaneously moved all extremities  Ext: Warm, no deformities Skin: Left posterior scalp    Data Reviewed: Troponin 18 WBC, Hgb, Plt wnl. ALC 600. Bicarb 22, AG 16, BUN 18, Cr 1.16.  UA: +dipstick protein, negative micro. CT head with motion degradation, no large intracranial abnormality.   Assessment and Plan: Acute metabolic encephalopathy on chronic dementia, concern for initial seizure:  - Given ongoing lethargy, neurology, Dr. Germaine, recommends admission, EEG, MRI brain. Has alerted Dr. Merrianne of plan to admit to [facility] under hospitalist service.  - Holding aricept  and all other oral medications given his current aspiration risk. - Facility MAR confirms standing hydrocodone-APAP TID and hydroxyzine prn started 2 days ago. Will hold these. If they are contributing, would expect washout overnight.   - Also note family report that pt has mostly upper extremity tremors at times, saw neurology Dr. Chalice in 2022 for excessive sleepiness and had a Dx listed of myoclonus.  - Seizure precautions - Neurochecks q4h - IVF ordered given his current NPO status.  DNR: POA.  - GOC conversations should be ongoing, as his HCPOA is attuned to the fact that he has severe  dementia and is 89 years old. He confirms that admission to [facility] is within the patient's goals of care.   Hyperlipidemia:  - Continue statin once able to take po  Hypothyroidism: Dx 2022. Last TSH 1.60 in Feb 2025.  - Continue synthroid  50mcg once able to do so. Recheck TSH.    Depression:  - Once safe to restart oral medications, would restart  lexapro  20mg  and has also been on amitriptyline  10mg  qHS. Reportedly intolerant to bupropion  which is now contraindicated anyway.   OSA Dx 09/01/2020.  - Unable to use a CPAP  Multiple DVT/PE: DVT 2001 after surgery; PE 2009 ultimately stopped anticoagulation, though had spontaneous right leg DVT 11/2019.  - Due to frequent falls, the patient has been taken off blood thinners in the past week.     Advance Care Planning:   Code Status: Limited: Do not attempt resuscitation (DNR) -DNR-LIMITED -Do Not Intubate/DNI   Consults: Neurology  Family Communication: At bedside  Severity of Illness: The appropriate patient status for this patient is OBSERVATION. Observation status is judged to be reasonable and necessary in order to provide the required intensity of service to ensure the patient's safety. The patient's presenting symptoms, physical exam findings, and initial radiographic and laboratory data in the context of their medical condition is felt to place them at decreased risk for further clinical deterioration. Furthermore, it is anticipated that the patient will be medically stable for discharge from the hospital within 2 midnights of admission.   Author: Bernardino KATHEE Come, MD 06/01/2024 3:48 PM  For on call review [email or web address].      [1]  Allergies Allergen Reactions   Sulfonamide Derivatives Nausea And Vomiting   Nitrofuran Derivatives Other (See Comments)    Caused headaches    "

## 2024-06-01 NOTE — ED Notes (Signed)
 Pt has been changed into new brief; previous brief full.

## 2024-06-02 ENCOUNTER — Observation Stay (HOSPITAL_COMMUNITY)

## 2024-06-02 ENCOUNTER — Other Ambulatory Visit (HOSPITAL_COMMUNITY): Payer: Self-pay

## 2024-06-02 DIAGNOSIS — R569 Unspecified convulsions: Secondary | ICD-10-CM | POA: Diagnosis not present

## 2024-06-02 DIAGNOSIS — G9341 Metabolic encephalopathy: Secondary | ICD-10-CM | POA: Diagnosis not present

## 2024-06-02 LAB — CBC WITH DIFFERENTIAL/PLATELET
Abs Immature Granulocytes: 0.05 10*3/uL (ref 0.00–0.07)
Basophils Absolute: 0.1 10*3/uL (ref 0.0–0.1)
Basophils Relative: 1 %
Eosinophils Absolute: 0.1 10*3/uL (ref 0.0–0.5)
Eosinophils Relative: 1 %
HCT: 45.1 % (ref 39.0–52.0)
Hemoglobin: 15.4 g/dL (ref 13.0–17.0)
Immature Granulocytes: 1 %
Lymphocytes Relative: 12 %
Lymphs Abs: 1.1 10*3/uL (ref 0.7–4.0)
MCH: 32.9 pg (ref 26.0–34.0)
MCHC: 34.1 g/dL (ref 30.0–36.0)
MCV: 96.4 fL (ref 80.0–100.0)
Monocytes Absolute: 1 10*3/uL (ref 0.1–1.0)
Monocytes Relative: 11 %
Neutro Abs: 6.3 10*3/uL (ref 1.7–7.7)
Neutrophils Relative %: 74 %
Platelets: 205 10*3/uL (ref 150–400)
RBC: 4.68 MIL/uL (ref 4.22–5.81)
RDW: 12.1 % (ref 11.5–15.5)
WBC: 8.6 10*3/uL (ref 4.0–10.5)
nRBC: 0 % (ref 0.0–0.2)

## 2024-06-02 LAB — COMPREHENSIVE METABOLIC PANEL WITH GFR
ALT: 18 U/L (ref 0–44)
AST: 45 U/L — ABNORMAL HIGH (ref 15–41)
Albumin: 3.7 g/dL (ref 3.5–5.0)
Alkaline Phosphatase: 112 U/L (ref 38–126)
Anion gap: 12 (ref 5–15)
BUN: 15 mg/dL (ref 8–23)
CO2: 19 mmol/L — ABNORMAL LOW (ref 22–32)
Calcium: 9.6 mg/dL (ref 8.9–10.3)
Chloride: 104 mmol/L (ref 98–111)
Creatinine, Ser: 1.18 mg/dL (ref 0.61–1.24)
GFR, Estimated: 59 mL/min — ABNORMAL LOW
Glucose, Bld: 90 mg/dL (ref 70–99)
Potassium: 5.3 mmol/L — ABNORMAL HIGH (ref 3.5–5.1)
Sodium: 136 mmol/L (ref 135–145)
Total Bilirubin: 0.8 mg/dL (ref 0.0–1.2)
Total Protein: 7.4 g/dL (ref 6.5–8.1)

## 2024-06-02 MED ORDER — LATANOPROST 0.005 % OP SOLN
1.0000 [drp] | Freq: Every day | OPHTHALMIC | Status: AC
  Administered 2024-06-03 – 2024-06-08 (×6): 1 [drp] via OPHTHALMIC
  Filled 2024-06-02: qty 2.5

## 2024-06-02 MED ORDER — AMITRIPTYLINE HCL 10 MG PO TABS
10.0000 mg | ORAL_TABLET | Freq: Every day | ORAL | Status: DC
  Administered 2024-06-02 – 2024-06-03 (×2): 10 mg via ORAL
  Filled 2024-06-02 (×2): qty 1

## 2024-06-02 MED ORDER — DORZOLAMIDE HCL-TIMOLOL MAL 2-0.5 % OP SOLN
1.0000 [drp] | Freq: Two times a day (BID) | OPHTHALMIC | Status: AC
  Administered 2024-06-02 – 2024-06-08 (×12): 1 [drp] via OPHTHALMIC
  Filled 2024-06-02 (×2): qty 10

## 2024-06-02 MED ORDER — ESCITALOPRAM OXALATE 10 MG PO TABS
20.0000 mg | ORAL_TABLET | Freq: Every day | ORAL | Status: AC
  Administered 2024-06-02 – 2024-06-08 (×7): 20 mg via ORAL
  Filled 2024-06-02 (×7): qty 2

## 2024-06-02 MED ORDER — ORAL CARE MOUTH RINSE: 15.0000 mL | OROMUCOSAL | Status: AC | PRN

## 2024-06-02 MED ORDER — DONEPEZIL HCL 10 MG PO TABS
10.0000 mg | ORAL_TABLET | Freq: Every day | ORAL | Status: AC
  Administered 2024-06-02 – 2024-06-08 (×7): 10 mg via ORAL
  Filled 2024-06-02 (×8): qty 1

## 2024-06-02 MED ORDER — VALPROIC ACID 250 MG PO CAPS
500.0000 mg | ORAL_CAPSULE | Freq: Two times a day (BID) | ORAL | 0 refills | Status: AC
  Filled 2024-06-02: qty 120, 30d supply, fill #0

## 2024-06-02 MED ORDER — LEVOTHYROXINE SODIUM 50 MCG PO TABS
50.0000 ug | ORAL_TABLET | Freq: Every day | ORAL | Status: AC
  Administered 2024-06-03 – 2024-06-08 (×6): 50 ug via ORAL
  Filled 2024-06-02 (×6): qty 1

## 2024-06-02 MED ORDER — ATENOLOL 25 MG PO TABS
25.0000 mg | ORAL_TABLET | Freq: Every day | ORAL | Status: AC
  Administered 2024-06-02 – 2024-06-08 (×7): 25 mg via ORAL
  Filled 2024-06-02 (×7): qty 1

## 2024-06-02 MED ORDER — VALPROIC ACID 250 MG PO CAPS
500.0000 mg | ORAL_CAPSULE | Freq: Two times a day (BID) | ORAL | Status: DC
  Administered 2024-06-02 – 2024-06-08 (×13): 500 mg via ORAL
  Filled 2024-06-02 (×14): qty 2

## 2024-06-02 NOTE — Progress Notes (Signed)
 "  [facility] PROGRESS NOTE   Brandon Burke [facility] FMW:[id number] DOB: [birth year] DOA: 06/01/2024  PCP: Leontine Phebe Charlotte ONEIDA, NP  Brief History: 89 y.o. male with a history of dementia, HTN, HLD, OSA, history of PE no longer on anticoagulation due to recurrent falls, hypothyroidism, prostate CA s/p prostatectomy who presented from his living facility due to concern for seizure activity.  Patient was transferred to [facility] for evaluation by neurology.  Consultants: Neurology  Procedures: EEG    Subjective/Interval History: Patient is pleasantly confused.  Denies any headaches.  No chest pain or shortness of breath.  No nausea or vomiting.    Assessment/Plan:  Seizures Appears to be new onset.   Patient underwent CT head followed by MRI brain.  MRI brain does not show any acute findings but several chronic changes were noted. Patient also underwent EEG. Seen by neurology.  Patient has been started on Depakote.  They have communicated with patient's family.  No further workup at this time. PT and OT eval.  Acute metabolic encephalopathy in the setting of dementia Stable.  Mentation appears to have improved.  Hypothyroidism Continue levothyroxine .  History of depression Should be able to resume his home medications.  He was on Lexapro  and amitriptyline . Caution with using amitriptyline  in elderly patient.  History of DVT and PE No longer on anticoagulation due to frequent falls.  DVT Prophylaxis: Initiate SCDs Code Status: DNR Family Communication: No family at bedside Disposition Plan: Unclear if he is from ALF or SNF.  Will involve TOC  Status is: Observation The patient will require care spanning > 2 midnights and should be moved to inpatient because: Seizure activity.      Medications: Scheduled:  valproic  acid  500 mg Oral BID   Continuous: PRN:acetaminophen  **OR** acetaminophen , mouth rinse  Antibiotics: Anti-infectives (From admission,  onward)    None       Objective:  Vital Signs  Vitals:   06/01/24 2146 06/01/24 2339 06/02/24 0321 06/02/24 0827  BP: (!) 161/66 (!) 157/64 (!) 148/60 (!) 165/69  Pulse: 77 72 82 79  Resp: 18 (P) 17 18 16   Temp: 98.4 F (36.9 C) 97.7 F (36.5 C) 98.1 F (36.7 C) 98.4 F (36.9 C)  TempSrc: Oral Oral Oral Oral  SpO2: 94% 94% 96% 97%  Weight:      Height:        Intake/Output Summary (Last 24 hours) at 06/02/2024 1048 Last data filed at 06/02/2024 9364 Gross per 24 hour  Intake 621.11 ml  Output 1200 ml  Net -578.89 ml   Filed Weights   06/01/24 0933  Weight: 78 kg    General appearance: Awake alert.  In no distress.  Distracted Resp: Clear to auscultation bilaterally.  Normal effort Cardio: S1-S2 is normal regular.  No S3-S4.  No rubs murmurs or bruit GI: Abdomen is soft.  Nontender nondistended.  Bowel sounds are present normal.  No masses organomegaly Extremities: No edema.  Full range of motion of lower extremities. No obvious focal neurological deficits noted.  Lab Results:  Data Reviewed: I have personally reviewed following labs and reports of the imaging studies  CBC: Recent Labs  Lab 06/01/24 1012 06/02/24 1011  WBC 5.6 8.6  NEUTROABS 4.3 6.3  HGB 14.1 15.4  HCT 43.7 45.1  MCV 101.4* 96.4  PLT 173 205    Basic Metabolic Panel: Recent Labs  Lab 06/01/24 1012  NA 143  K 4.2  CL 104  CO2 22  GLUCOSE  109*  BUN 18  CREATININE 1.16  CALCIUM  9.0    GFR: Estimated Creatinine Clearance: 46.7 mL/min (by C-G formula based on SCr of 1.16 mg/dL).  Liver Function Tests: Recent Labs  Lab 06/01/24 1012  AST 24  ALT 15  ALKPHOS 101  BILITOT 0.5  PROT 6.9  ALBUMIN 4.1    Thyroid  Function Tests: Recent Labs    06/01/24 1157  TSH 1.660    Radiology Studies: EEG adult Result Date: 06/02/2024 Shelton Arlin KIDD, MD     06/02/2024 10:43 AM Patient Name: Brandon Burke MRN: [id number] Epilepsy Attending: Arlin KIDD Shelton Referring  Physician/Provider: Voncile Isles, MD Date: 06/02/2024 Duration: 22.34 mins Patient history:  89 y.o. male with episode of seizure like activity. EEG to evaluate for seizure Level of alertness: Awake AEDs during EEG study: VPA Technical aspects: This EEG study was done with scalp electrodes positioned according to the 10-20 International system of electrode placement. Electrical activity was reviewed with band pass filter of 1-70Hz , sensitivity of 7 uV/mm, display speed of 54mm/sec with a 60Hz  notched filter applied as appropriate. EEG data were recorded continuously and digitally stored.  Video monitoring was available and reviewed as appropriate. Description: The posterior dominant rhythm consists of 7 Hz activity of moderate voltage (25-35 uV) seen predominantly in posterior head regions, symmetric and reactive to eye opening and eye closing. EEG showed continuous generalized predominantly 5 to 7 Hz theta slowing admixed with intermittent 2-3hz  delta slowing. Hyperventilation and photic stimulation were not performed.   ABNORMALITY - Continuous slow, generalized IMPRESSION: This study is suggestive of generalized cerebral dysfunction ( encephalopathy). No seizures or epileptiform discharges were seen throughout the recording. Priyanka O Yadav   MR BRAIN W WO CONTRAST Result Date: 06/01/2024 EXAM: MRI BRAIN WITH AND WITHOUT CONTRAST 06/01/2024 05:08:54 PM TECHNIQUE: Multiplanar multisequence MRI of the head/brain was performed with and without the administration of intravenous contrast. COMPARISON: None available. CLINICAL HISTORY: Seizure, new-onset, no history of trauma. New-onset seizure; no history of trauma. FINDINGS: PORT BRAIN AND VENTRICLES: Carotid infarcts are present in the cerebellum, left greater than right. Consolidative scattered foci of susceptibility are present over the high frontal and parietal convexities. Advanced atrophy and diffuse confluent white matter changes are present bilaterally. No  acute intracranial hemorrhage. No mass effect or midline shift. No hydrocephalus. The sella is unremarkable. Normal flow voids. ORBITS: No significant abnormality. SINUSES: No significant abnormality. BONES AND SOFT TISSUES: Normal bone marrow signal. No soft tissue abnormality. IMPRESSION: 1. Scattered foci of susceptibility over the high frontal and parietal convexities. This likely represents remote microhemorrhages related to underlying vasculitis or hypertension. 2. Chronic cerebellar infarcts, left greater than right. 3. Advanced atrophy and diffuse confluent white matter changes bilaterally. Electronically signed by: Lonni Necessary MD 06/01/2024 05:56 PM EST RP Workstation: [id number]   CT Head Wo Contrast Result Date: 06/01/2024 EXAM: CT HEAD WITHOUT CONTRAST 06/01/2024 10:43:52 AM TECHNIQUE: CT of the head was performed without the administration of intravenous contrast. Automated exposure control, iterative reconstruction, and/or weight based adjustment of the mA/kV was utilized to reduce the radiation dose to as low as reasonably achievable. Exam quality is diminished due to motion artifact. COMPARISON: None available. CLINICAL HISTORY: Seizure, new-onset, no history of trauma. New-onset seizure; no history of trauma. FINDINGS: BRAIN AND VENTRICLES: No acute hemorrhage. No evidence of acute infarct. Hypoattenuating foci in the cerebral white matter, most likely representing chronic small vessel disease. Remote bilateral basal ganglia lacunar infarcts with ex vacuo dilatation of the anterior horn  of the right lateral ventricle. Prominence of the sulci and ventricles compatible with brain atrophy. No hydrocephalus. No extra-axial collection. No mass effect or midline shift. ORBITS: No acute abnormality. SINUSES: Mucosal thickening involving the ethmoid air cells and left maxillary sinus. No sinus air-fluid levels. SOFT TISSUES AND SKULL: Large scalp hematoma overlying the left lateral convexity  measuring 2.7 x 1.6 cm, axial image 29/10. No acute soft tissue abnormality (other than the hematoma). No skull fracture. IMPRESSION: 1. Exam quality is diminished due to motion artifact. 2. No acute intracranial abnormality related to the new-onset seizure. 3. Chronic small vessel ischemic changes and brain atrophy. 4. Large scalp hematoma overlying the left lateral convexity measuring 2.7 x 1.6 cm, likely related to the reported seizure. Electronically signed by: Waddell Calk MD 06/01/2024 10:53 AM EST RP Workstation: [id number]       LOS: 0 days   Joette Pebbles  [facility] Pager on [email or web address]  06/02/2024, 10:48 AM   "

## 2024-06-02 NOTE — Care Management Obs Status (Signed)
 MEDICARE OBSERVATION STATUS NOTIFICATION   Patient Details  Name: Brandon Burke MRN: [id number] Date of Birth: Apr 14, 1932   Medicare Observation Status Notification Given:  Yes    Elodie Panameno G., RN 06/02/2024, 9:53 AM

## 2024-06-02 NOTE — Progress Notes (Signed)
 Routine EEG completed, results pending Neurology review and interpretation

## 2024-06-02 NOTE — Plan of Care (Signed)

## 2024-06-02 NOTE — Procedures (Signed)
 Patient Name: DERAK SCHURMAN  MRN: [id number]  Epilepsy Attending: Arlin MALVA Krebs  Referring Physician/Provider: Voncile Isles, MD  Date: 06/02/2024 Duration: 22.34 mins  Patient history:  89 y.o. male with episode of seizure like activity. EEG to evaluate for seizure  Level of alertness: Awake  AEDs during EEG study: VPA  Technical aspects: This EEG study was done with scalp electrodes positioned according to the 10-20 International system of electrode placement. Electrical activity was reviewed with band pass filter of 1-70Hz , sensitivity of 7 uV/mm, display speed of 63mm/sec with a 60Hz  notched filter applied as appropriate. EEG data were recorded continuously and digitally stored.  Video monitoring was available and reviewed as appropriate.  Description: The posterior dominant rhythm consists of 7 Hz activity of moderate voltage (25-35 uV) seen predominantly in posterior head regions, symmetric and reactive to eye opening and eye closing. EEG showed continuous generalized predominantly 5 to 7 Hz theta slowing admixed with intermittent 2-3hz  delta slowing. Hyperventilation and photic stimulation were not performed.     ABNORMALITY - Continuous slow, generalized  IMPRESSION: This study is suggestive of generalized cerebral dysfunction ( encephalopathy). No seizures or epileptiform discharges were seen throughout the recording.  Izik Bingman O Keval Nam

## 2024-06-02 NOTE — Evaluation (Signed)
 Clinical/Bedside Swallow Evaluation Patient Details  Name: Brandon Burke MRN: [id number] Date of Birth: 11/16/1931  Today's Date: 06/02/2024 Time: SLP Start Time (ACUTE ONLY): 0807 SLP Stop Time (ACUTE ONLY): 0815 SLP Time Calculation (min) (ACUTE ONLY): 8 min  Past Medical History:  Past Medical History:  Diagnosis Date   Constipation    DVT (deep venous thrombosis) (HCC)    hx of 2001 (after prostate surgery)     Glaucoma    Hyperlipidemia    Hypertension    Melanoma (HCC)    face, hand   Mitral valve prolapse    OSA (obstructive sleep apnea) 11/26/2020   PE (pulmonary embolism)    5-09:was referred to hematology, and they recommend to discontinue Coumadin 5/11   Prostate cancer (HCC)     released from urology in 2010,needs yearly PSAs with PCP   Seasonal allergies    Past Surgical History:  Past Surgical History:  Procedure Laterality Date   CATARACT EXTRACTION Bilateral    EYE SURGERY Bilateral    Cat Sx   PARS PLANA VITRECTOMY Left 09/06/2019   Procedure: PARS PLANA VITRECTOMY WITH 25 GAUGE WITH INTRAVITREAL ANTIBIOTICS;  Surgeon: Valdemar Rogue, MD;  Location: [facility] OR;  Service: Ophthalmology;  Laterality: Left;   PHOTOCOAGULATION WITH LASER Left 09/06/2019   Procedure: Photocoagulation With Laser;  Surgeon: Valdemar Rogue, MD;  Location: [facility] OR;  Service: Ophthalmology;  Laterality: Left;   PROSTATECTOMY  2000   RUPTURED GLOBE EXPLORATION AND REPAIR Left 08/16/2019   Procedure: OPEN GLOBE EXPLORATION EYE POSSIBLE ANTERIOR  CHAMBER WASH OUT;  Surgeon: Marcey Elspeth PARAS, MD;  Location: [facility] OR;  Service: Ophthalmology;  Laterality: Left;   HPI:  89 yo male presenting to [facility] ED 1/30 after an episode of seizure-like activity. MRI negative for acute intracranial abnormality. Pt seen by SLP in AIR for cognitive-linguistic goals 01/2021 after a TBI. PMH: dementia, HTN, HLD, OSA, history of PE no longer on anticoagulation due to recurrent falls, hypothyroidism, postrate cancer s/p  prostatectomy    Assessment / Plan / Recommendation  Clinical Impression  Will advance to regular solids and thin liquids, encouraging full supervision for PO intake. He is at advanced risk for chronic pharyngeal dysphagia secondary to his h/o dementia but has not had an acute change that further increases risk.  Pt's mentation appears to now be more consistent with his baseline. He is alert and fed himself regular solids, clearing his oral cavity completely. Observed consecutive sips of thin liquids without overt coughing, though throat clearance was observed intermittently primarily following solids.   SLP Visit Diagnosis: Dysphagia, unspecified (R13.10)    Aspiration Risk  Mild aspiration risk    Diet Recommendation           Other Recommendations Oral Care Recommendations: Oral care BID     Swallow Evaluation Recommendations Recommendations: PO diet PO Diet Recommendation: Regular;Thin liquids (Level 0) Liquid Administration via: Cup;Straw Medication Administration: Whole meds with puree Supervision: Full assist for feeding;Full supervision/cueing for swallowing strategies Swallowing strategies  : Minimize environmental distractions;Slow rate;Small bites/sips Postural changes: Position pt fully upright for meals Oral care recommendations: Oral care BID (2x/day)   Assistance Recommended at Discharge    Functional Status Assessment Patient has not had a recent decline in their functional status  Frequency and Duration            Prognosis Prognosis for improved oropharyngeal function: Good Barriers to Reach Goals: Cognitive deficits      Swallow Study   General HPI: 89  yo male presenting to [facility] ED 1/30 after an episode of seizure-like activity. MRI negative for acute intracranial abnormality. Pt seen by SLP in AIR for cognitive-linguistic goals 01/2021 after a TBI. PMH: dementia, HTN, HLD, OSA, history of PE no longer on anticoagulation due to recurrent falls,  hypothyroidism, postrate cancer s/p prostatectomy Type of Study: Bedside Swallow Evaluation Previous Swallow Assessment: none in chart Diet Prior to this Study: NPO Temperature Spikes Noted: No Respiratory Status: Room air History of Recent Intubation: No Behavior/Cognition: Alert;Cooperative;Pleasant mood Oral Cavity Assessment: Within Functional Limits Oral Care Completed by SLP: No Oral Cavity - Dentition: Adequate natural dentition Vision: Functional for self-feeding Self-Feeding Abilities: Able to feed self Patient Positioning: Upright in bed Baseline Vocal Quality: Normal Volitional Cough: Strong Volitional Swallow: Able to elicit    Oral/Motor/Sensory Function Overall Oral Motor/Sensory Function: Within functional limits   Ice Chips Ice chips: Not tested   Thin Liquid Thin Liquid: Within functional limits Presentation: Straw;Self Fed    Nectar Thick Nectar Thick Liquid: Not tested   Honey Thick Honey Thick Liquid: Not tested   Puree Puree: Not tested   Solid     Solid: Impaired Pharyngeal Phase Impairments: Throat Clearing - Delayed      Damien Blumenthal, M.A., CCC-SLP Speech Language Pathology, Acute Rehabilitation Services  Secure Chat preferred [phone]  06/02/2024,8:55 AM

## 2024-06-02 NOTE — TOC Progression Note (Signed)
 Call placed to [facility] re possible dc today. Per Ezzie at [place], they are unable to accept pt back this weekend due to weather related staffing shortages. MD updated re barrier to dc.   Julien Das, MSW, LCSW [phone] (coverage)

## 2024-06-03 ENCOUNTER — Other Ambulatory Visit (HOSPITAL_COMMUNITY): Payer: Self-pay

## 2024-06-03 DIAGNOSIS — G9341 Metabolic encephalopathy: Secondary | ICD-10-CM | POA: Diagnosis not present

## 2024-06-03 DIAGNOSIS — R569 Unspecified convulsions: Secondary | ICD-10-CM | POA: Diagnosis not present

## 2024-06-03 MED ORDER — ASPIRIN 81 MG PO CHEW
81.0000 mg | CHEWABLE_TABLET | Freq: Every day | ORAL | Status: AC
  Administered 2024-06-03 – 2024-06-08 (×6): 81 mg via ORAL
  Filled 2024-06-03 (×6): qty 1

## 2024-06-03 MED ORDER — PANTOPRAZOLE SODIUM 40 MG PO TBEC
40.0000 mg | DELAYED_RELEASE_TABLET | Freq: Every day | ORAL | Status: AC
  Administered 2024-06-03 – 2024-06-08 (×6): 40 mg via ORAL
  Filled 2024-06-03 (×6): qty 1

## 2024-06-03 MED ORDER — POLYETHYLENE GLYCOL 3350 17 G PO PACK
17.0000 g | PACK | Freq: Every day | ORAL | Status: AC
  Administered 2024-06-05 – 2024-06-08 (×4): 17 g via ORAL
  Filled 2024-06-03 (×5): qty 1

## 2024-06-03 MED ORDER — PRAVASTATIN SODIUM 40 MG PO TABS
40.0000 mg | ORAL_TABLET | Freq: Every day | ORAL | Status: AC
  Administered 2024-06-03 – 2024-06-08 (×6): 40 mg via ORAL
  Filled 2024-06-03 (×7): qty 1

## 2024-06-03 NOTE — Plan of Care (Signed)

## 2024-06-03 NOTE — Evaluation (Signed)
 Occupational Therapy Evaluation Patient Details Name: Brandon Burke MRN: [id number] DOB: 17-Mar-1932 Today's Date: 06/03/2024   History of Present Illness   Pt is a 89 y.o. M who presented to [facility] ED from ALF d/t c/f seizure activity. Triage note reports grand mal seizure activity while sitting in a chair. Per EMS, he was post ictal. CT showed a scalp hematoma, brain atrophy and small vessel disease without acute intracranial findings. UA negative. MRI showed scattered foci of susceptibility over the high frontal and parietal convexities, chronic cerebellar infarcts (L>R), and advanced atrophy. EEG suggestive of generalized cerebral dysfunction (encephalopathy). PMHx: dementia, HTN, HLD, OSA, history of PE no longer on anticoagulation due to recurrent falls, hypothyroidism, prostate CA s/p prostatectomy.     Clinical Impressions Pt seen for OT evaluation, received in supine with bed linens soiled. Pt pulled of primifit, NT notified. No family at bedside; pt is a poor historian so unclear PLOF. Per notes, pt from [facility]. Today he presents with persistent confusion, very poor command following, and general weakness. Eyes remained closed for majority of session. Requires max A for bed mobility/linen exchange and max/total A for all self-care. Pt with poor initiation and execution 2/2 cog deficits (baseline dementia per chart). Pt is functioning below his baseline; current recommendation is post-acute rehab (< 3 hours/day). Will continue to follow.     If plan is discharge home, recommend the following:   Two people to help with walking and/or transfers;Two people to help with bathing/dressing/bathroom;Supervision due to cognitive status     Functional Status Assessment   Patient has had a recent decline in their functional status and demonstrates the ability to make significant improvements in function in a reasonable and predictable amount of time.     Equipment  Recommendations   Other (comment) (defer to next level of care)     Recommendations for Other Services         Precautions/Restrictions   Precautions Precautions: Fall Recall of Precautions/Restrictions: Impaired Restrictions Weight Bearing Restrictions Per Provider Order: No     Mobility Bed Mobility Overal bed mobility: Needs Assistance Bed Mobility: Rolling, Supine to Sit Rolling: Max assist   Supine to sit: Mod assist     General bed mobility comments: needs A to come into long sitting, max multimodal cognitive cues to roll bilaterally for linen change    Transfers                   General transfer comment: not assessed 2/2 pt soiled, persistent confusion, and very poor command following      Balance                                           ADL either performed or assessed with clinical judgement   ADL Overall ADL's : Needs assistance/impaired Eating/Feeding: Maximal assistance Eating/Feeding Details (indicate cue type and reason): observed pt with food splayed across himself and the bed, attempting to eat scrambled eggs & breakfast potatoes with hands rather than utensils, even though fork in hand Grooming: Maximal assistance Grooming Details (indicate cue type and reason): pt with poor initiation         Upper Body Dressing : Maximal assistance   Lower Body Dressing: Total assistance       Toileting- Clothing Manipulation and Hygiene: Total assistance Toileting - Clothing Manipulation Details (indicate cue type and reason): pt  had pulled primifit off, sheets and pad completely soaked, requiring total A for hygiene             Vision Baseline Vision/History: 1 Wears glasses       Perception         Praxis         Pertinent Vitals/Pain Pain Assessment Pain Assessment: PAINAD Breathing: normal Negative Vocalization: none Facial Expression: smiling or inexpressive Body Language: tense, distressed pacing,  fidgeting Consolability: no need to console PAINAD Score: 1 Pain Intervention(s): Monitored during session     Extremity/Trunk Assessment Upper Extremity Assessment Upper Extremity Assessment: Difficult to assess due to impaired cognition   Lower Extremity Assessment Lower Extremity Assessment: Difficult to assess due to impaired cognition   Cervical / Trunk Assessment Cervical / Trunk Assessment: Kyphotic   Communication Communication Communication: Impaired Factors Affecting Communication: Hearing impaired   Cognition Arousal: Alert Behavior During Therapy: Impulsive Cognition: History of cognitive impairments (baseline dementia per chart)             OT - Cognition Comments: pt with eyes closed for majority of session, had pulled primifit off upon OT entry                 Following commands: Impaired Following commands impaired: Follows one step commands inconsistently (<15%)     Cueing  General Comments   Cueing Techniques: Verbal cues;Gestural cues;Tactile cues      Exercises     Shoulder Instructions      Home Living Family/patient expects to be discharged to:: Assisted living [facility])                                        Prior Functioning/Environment Prior Level of Function : Patient poor historian/Family not available             Mobility Comments: pt reports he does not ambulate as much anymore, reports he can feed and dress himself ADLs Comments: pt is a poor historian    OT Problem List: Decreased strength;Decreased activity tolerance;Decreased cognition;Decreased safety awareness   OT Treatment/Interventions: Self-care/ADL training;Therapeutic exercise;Patient/family education;Therapeutic activities      OT Goals(Current goals can be found in the care plan section)   Acute Rehab OT Goals Patient Stated Goal: did not state OT Goal Formulation: Patient unable to participate in goal setting Time For  Goal Achievement: 06/17/24 Potential to Achieve Goals: Fair   OT Frequency:  Min 1X/week    Co-evaluation              AM-PAC OT 6 Clicks Daily Activity     Outcome Measure Help from another person eating meals?: A Lot Help from another person taking care of personal grooming?: A Lot Help from another person toileting, which includes using toliet, bedpan, or urinal?: Total Help from another person bathing (including washing, rinsing, drying)?: A Lot Help from another person to put on and taking off regular upper body clothing?: A Lot Help from another person to put on and taking off regular lower body clothing?: Total 6 Click Score: 10   End of Session Nurse Communication: Mobility status;Other (comment) (pt pulled primifit off)  Activity Tolerance: Other (comment) (limited by confusion & cognition) Patient left: in bed;with call bell/phone within reach;with bed alarm set  OT Visit Diagnosis: Other abnormalities of gait and mobility (R26.89);Muscle weakness (generalized) (M62.81);Other symptoms and signs involving cognitive function  Time: 8980-8953 OT Time Calculation (min): 27 min Charges:  OT General Charges $OT Visit: 1 Visit OT Evaluation $OT Eval Moderate Complexity: 1 Mod OT Treatments $Therapeutic Activity: 8-22 mins  Aleah Ahlgrim M. [place], OTR/L [facility] Acute Rehabilitation Services [phone] Secure Chat Preferred  Vonya Ohalloran 06/03/2024, 1:22 PM

## 2024-06-03 NOTE — Progress Notes (Signed)
 "  [facility] PROGRESS NOTE   Cleave Ternes [facility] FMW:[id number] DOB: December 26, 1931 DOA: 06/01/2024  PCP: Leontine Phebe Charlotte ONEIDA, NP  Brief History: 89 y.o. male with a history of dementia, HTN, HLD, OSA, history of PE no longer on anticoagulation due to recurrent falls, hypothyroidism, prostate CA s/p prostatectomy who presented from his living facility due to concern for seizure activity.  Patient was transferred to [facility] for evaluation by neurology.  Consultants: Neurology  Procedures: EEG  Subjective/Interval History: Patient noted to be distracted this morning.  No agitation noted overnight however he is a little bit more confused compared to yesterday morning.    Assessment/Plan:  Seizures Appears to be new onset.   Patient underwent CT head followed by MRI brain.  MRI brain does not show any acute findings but several chronic changes were noted. Patient also underwent EEG. Seen by neurology.  Patient has been started on Depakote.  They have communicated with patient's family.  No further workup at this time. Home health is recommended by PT.  Acute metabolic encephalopathy in the setting of dementia Some degree of sundowning is present.  This is in the setting of known dementia.  No focal neurological deficits noted.  Hypothyroidism Continue levothyroxine .  History of depression Should be able to resume his home medications.  He was on Lexapro  and amitriptyline . Caution with using amitriptyline  in elderly patient.  History of DVT and PE No longer on anticoagulation due to frequent falls.  DVT Prophylaxis: SCDs Code Status: DNR Family Communication: No family at bedside Disposition Plan: He is from assisted living facility.  Home health ordered.  Facility cannot take him till tomorrow.     Medications: Scheduled:  amitriptyline   10 mg Oral QHS   atenolol   25 mg Oral Daily   donepezil   10 mg Oral QHS   dorzolamide -timolol   1 drop Both Eyes BID    escitalopram   20 mg Oral Daily   latanoprost   1 drop Both Eyes QHS   levothyroxine   50 mcg Oral QAC breakfast   valproic  acid  500 mg Oral BID   Continuous: PRN:acetaminophen  **OR** acetaminophen , mouth rinse   Objective:  Vital Signs  Vitals:   06/02/24 1547 06/02/24 2142 06/03/24 0127 06/03/24 0731  BP: (!) 167/74 (!) 155/85 (!) 154/61 (!) 158/74  Pulse: 68 73 62 64  Resp: 16 18 18 18   Temp: 97.8 F (36.6 C)  97.6 F (36.4 C)   TempSrc: Oral  Oral   SpO2: 100% 95% 100% 98%  Weight:      Height:        Intake/Output Summary (Last 24 hours) at 06/03/2024 0948 Last data filed at 06/02/2024 1826 Gross per 24 hour  Intake --  Output 500 ml  Net -500 ml   Filed Weights   06/01/24 0933  Weight: 78 kg    General appearance: Awake alert.  In no distress.  Distracted Resp: Clear to auscultation bilaterally.  Normal effort Cardio: S1-S2 is normal regular.  No S3-S4.  No rubs murmurs or bruit GI: Abdomen is soft.  Nontender nondistended.  Bowel sounds are present normal.  No masses organomegaly Moving all of his extremities. He is disoriented but no focal neurological deficits are noted.  Lab Results:  Data Reviewed: I have personally reviewed following labs and reports of the imaging studies  CBC: Recent Labs  Lab 06/01/24 1012 06/02/24 1011  WBC 5.6 8.6  NEUTROABS 4.3 6.3  HGB 14.1 15.4  HCT 43.7 45.1  MCV 101.4* 96.4  PLT 173 205    Basic Metabolic Panel: Recent Labs  Lab 06/01/24 1012  NA 143  K 4.2  CL 104  CO2 22  GLUCOSE 109*  BUN 18  CREATININE 1.16  CALCIUM  9.0    GFR: Estimated Creatinine Clearance: 46.7 mL/min (by C-G formula based on SCr of 1.16 mg/dL).  Liver Function Tests: Recent Labs  Lab 06/01/24 1012  AST 24  ALT 15  ALKPHOS 101  BILITOT 0.5  PROT 6.9  ALBUMIN 4.1    Thyroid  Function Tests: Recent Labs    06/01/24 1157  TSH 1.660    Radiology Studies: EEG adult Result Date: 06/02/2024 Shelton Arlin KIDD, MD      06/02/2024 10:43 AM Patient Name: Brandon Burke MRN: [id number] Epilepsy Attending: Arlin KIDD Shelton Referring Physician/Provider: Voncile Isles, MD Date: 06/02/2024 Duration: 22.34 mins Patient history:  89 y.o. male with episode of seizure like activity. EEG to evaluate for seizure Level of alertness: Awake AEDs during EEG study: VPA Technical aspects: This EEG study was done with scalp electrodes positioned according to the 10-20 International system of electrode placement. Electrical activity was reviewed with band pass filter of 1-70Hz , sensitivity of 7 uV/mm, display speed of 50mm/sec with a 60Hz  notched filter applied as appropriate. EEG data were recorded continuously and digitally stored.  Video monitoring was available and reviewed as appropriate. Description: The posterior dominant rhythm consists of 7 Hz activity of moderate voltage (25-35 uV) seen predominantly in posterior head regions, symmetric and reactive to eye opening and eye closing. EEG showed continuous generalized predominantly 5 to 7 Hz theta slowing admixed with intermittent 2-3hz  delta slowing. Hyperventilation and photic stimulation were not performed.   ABNORMALITY - Continuous slow, generalized IMPRESSION: This study is suggestive of generalized cerebral dysfunction ( encephalopathy). No seizures or epileptiform discharges were seen throughout the recording. Priyanka O Yadav   MR BRAIN W WO CONTRAST Result Date: 06/01/2024 EXAM: MRI BRAIN WITH AND WITHOUT CONTRAST 06/01/2024 05:08:54 PM TECHNIQUE: Multiplanar multisequence MRI of the head/brain was performed with and without the administration of intravenous contrast. COMPARISON: None available. CLINICAL HISTORY: Seizure, new-onset, no history of trauma. New-onset seizure; no history of trauma. FINDINGS: PORT BRAIN AND VENTRICLES: Carotid infarcts are present in the cerebellum, left greater than right. Consolidative scattered foci of susceptibility are present over the high  frontal and parietal convexities. Advanced atrophy and diffuse confluent white matter changes are present bilaterally. No acute intracranial hemorrhage. No mass effect or midline shift. No hydrocephalus. The sella is unremarkable. Normal flow voids. ORBITS: No significant abnormality. SINUSES: No significant abnormality. BONES AND SOFT TISSUES: Normal bone marrow signal. No soft tissue abnormality. IMPRESSION: 1. Scattered foci of susceptibility over the high frontal and parietal convexities. This likely represents remote microhemorrhages related to underlying vasculitis or hypertension. 2. Chronic cerebellar infarcts, left greater than right. 3. Advanced atrophy and diffuse confluent white matter changes bilaterally. Electronically signed by: Lonni Necessary MD 06/01/2024 05:56 PM EST RP Workstation: [id number]   CT Head Wo Contrast Result Date: 06/01/2024 EXAM: CT HEAD WITHOUT CONTRAST 06/01/2024 10:43:52 AM TECHNIQUE: CT of the head was performed without the administration of intravenous contrast. Automated exposure control, iterative reconstruction, and/or weight based adjustment of the mA/kV was utilized to reduce the radiation dose to as low as reasonably achievable. Exam quality is diminished due to motion artifact. COMPARISON: None available. CLINICAL HISTORY: Seizure, new-onset, no history of trauma. New-onset seizure; no history of trauma. FINDINGS: BRAIN AND VENTRICLES: No acute hemorrhage.  No evidence of acute infarct. Hypoattenuating foci in the cerebral white matter, most likely representing chronic small vessel disease. Remote bilateral basal ganglia lacunar infarcts with ex vacuo dilatation of the anterior horn of the right lateral ventricle. Prominence of the sulci and ventricles compatible with brain atrophy. No hydrocephalus. No extra-axial collection. No mass effect or midline shift. ORBITS: No acute abnormality. SINUSES: Mucosal thickening involving the ethmoid air cells and left  maxillary sinus. No sinus air-fluid levels. SOFT TISSUES AND SKULL: Large scalp hematoma overlying the left lateral convexity measuring 2.7 x 1.6 cm, axial image 29/10. No acute soft tissue abnormality (other than the hematoma). No skull fracture. IMPRESSION: 1. Exam quality is diminished due to motion artifact. 2. No acute intracranial abnormality related to the new-onset seizure. 3. Chronic small vessel ischemic changes and brain atrophy. 4. Large scalp hematoma overlying the left lateral convexity measuring 2.7 x 1.6 cm, likely related to the reported seizure. Electronically signed by: Waddell Calk MD 06/01/2024 10:53 AM EST RP Workstation: [id number]       LOS: 0 days   Joette Pebbles  [facility] Pager on [email or web address]  06/03/2024, 9:48 AM   "

## 2024-06-04 ENCOUNTER — Other Ambulatory Visit (HOSPITAL_COMMUNITY): Payer: Self-pay

## 2024-06-04 DIAGNOSIS — G9341 Metabolic encephalopathy: Secondary | ICD-10-CM | POA: Diagnosis not present

## 2024-06-04 DIAGNOSIS — R569 Unspecified convulsions: Secondary | ICD-10-CM | POA: Diagnosis not present

## 2024-06-04 LAB — BASIC METABOLIC PANEL WITH GFR
Anion gap: 9 (ref 5–15)
BUN: 24 mg/dL — ABNORMAL HIGH (ref 8–23)
CO2: 28 mmol/L (ref 22–32)
Calcium: 9.4 mg/dL (ref 8.9–10.3)
Chloride: 101 mmol/L (ref 98–111)
Creatinine, Ser: 1.23 mg/dL (ref 0.61–1.24)
GFR, Estimated: 56 mL/min — ABNORMAL LOW
Glucose, Bld: 82 mg/dL (ref 70–99)
Potassium: 3.8 mmol/L (ref 3.5–5.1)
Sodium: 139 mmol/L (ref 135–145)

## 2024-06-04 LAB — MAGNESIUM: Magnesium: 2.4 mg/dL (ref 1.7–2.4)

## 2024-06-04 MED ORDER — ATENOLOL 25 MG PO TABS
25.0000 mg | ORAL_TABLET | Freq: Every day | ORAL | 0 refills | Status: AC
  Filled 2024-06-04: qty 30, 30d supply, fill #0

## 2024-06-04 NOTE — [place] FL2 (Signed)
 " [place]  MEDICAID FL2 LEVEL OF CARE FORM     IDENTIFICATION  Patient Name: Brandon Burke [facility] Birthdate: [birth year] Sex: male Admission Date (Current Location): 06/01/2024  [facility] and [place] Number:  [employer] and Address:  The [place], [address] [place], [place] [address]      Provider Number: [id number]  Attending Physician Name and Address:  Verdene Purchase, MD  Relative Name and Phone Number:       Current Level of Care: Hospital Recommended Level of Care: Skilled Nursing Facility Prior Approval Number:    Date Approved/Denied:   PASRR Number: [id number]  Discharge Plan: SNF    Current Diagnoses: Patient Active Problem List   Diagnosis Date Noted   Acute metabolic encephalopathy 06/01/2024   Moderate dementia (HCC) 01/27/2022   Depression, recurrent 11/25/2021   Hypothyroidism    Transaminitis    History of pulmonary embolus (PE)    TBI (traumatic brain injury) (HCC) 02/11/2021   Drug induced constipation    OSA (obstructive sleep apnea) 11/26/2020   Chronic fatigue 08/05/2020   Myoclonus 08/05/2020   Excessive daytime sleepiness 08/05/2020   Chronic anticoagulation 04/29/2020   Pulmonary nodule 12/26/2019   PCP NOTES >>>>>>>>>>>>>>> 10/09/2015   Allergic rhinitis 02/26/2014   Headache 03/09/2012   Annual physical exam 06/22/2011   Osteoarthritis 12/15/2009   Insomnia 06/02/2009   History of pulmonary embolism 09/22/2007   DVT, HX OF 09/13/2007   Dyslipidemia 10/27/2006   PROSTATE CANCER, HX OF 10/27/2006   MVP (mitral valve prolapse) 10/05/2006    Orientation RESPIRATION BLADDER Height & Weight     Self  Normal Incontinent Weight: 171 lb 15.3 oz (78 kg) Height:  6' 1 (185.4 cm)  BEHAVIORAL SYMPTOMS/MOOD NEUROLOGICAL BOWEL NUTRITION STATUS    Convulsions/Seizures Incontinent Diet (regular)  AMBULATORY STATUS COMMUNICATION OF NEEDS Skin   Extensive Assist Verbally Skin abrasions (left head, no  dressing)                       Personal Care Assistance Level of Assistance  Bathing, Feeding, Dressing Bathing Assistance: Maximum assistance Feeding assistance: Maximum assistance Dressing Assistance: Maximum assistance     Functional Limitations Info  Sight Sight Info: Impaired (blind left eye)        SPECIAL CARE FACTORS FREQUENCY  PT (By licensed PT), OT (By licensed OT)     PT Frequency: 5x/wk OT Frequency: 5x/wk            Contractures Contractures Info: Not present    Additional Factors Info  Code Status, Allergies, Psychotropic Code Status Info: DNR Allergies Info: Sulfonamide Derivatives, Nitrofuran Derivatives Psychotropic Info: Aricept  10mg  daily at bed; Lexapro  20 mg daily;         Current Medications (06/04/2024):  This is the current hospital active medication list Current Facility-Administered Medications  Medication Dose Route Frequency Provider Last Rate Last Admin   acetaminophen  (TYLENOL ) tablet 650 mg  650 mg Oral Q6H PRN Bryn Bernardino NOVAK, MD       Or   acetaminophen  (TYLENOL ) suppository 650 mg  650 mg Rectal Q6H PRN Bryn Bernardino NOVAK, MD       aspirin  chewable tablet 81 mg  81 mg Oral Daily Krishnan, Gokul, MD   81 mg at 06/04/24 1016   atenolol  (TENORMIN ) tablet 25 mg  25 mg Oral Daily Krishnan, Gokul, MD   25 mg at 06/04/24 1016   donepezil  (ARICEPT ) tablet 10 mg  10 mg Oral  QHS Krishnan, Gokul, MD   10 mg at 06/03/24 2255   dorzolamide -timolol  (COSOPT ) 2-0.5 % ophthalmic solution 1 drop  1 drop Both Eyes BID Krishnan, Gokul, MD   1 drop at 06/04/24 1017   escitalopram  (LEXAPRO ) tablet 20 mg  20 mg Oral Daily Krishnan, Gokul, MD   20 mg at 06/04/24 1016   latanoprost  (XALATAN ) 0.005 % ophthalmic solution 1 drop  1 drop Both Eyes QHS Krishnan, Gokul, MD   1 drop at 06/03/24 2257   levothyroxine  (SYNTHROID ) tablet 50 mcg  50 mcg Oral QAC breakfast Krishnan, Gokul, MD   50 mcg at 06/04/24 9340   Oral care mouth rinse  15 mL Mouth Rinse PRN Bryn Bernardino NOVAK, MD       pantoprazole  (PROTONIX ) EC tablet 40 mg  40 mg Oral Daily Krishnan, Gokul, MD   40 mg at 06/04/24 1017   polyethylene glycol (MIRALAX  / GLYCOLAX ) packet 17 g  17 g Oral Daily Krishnan, Gokul, MD       pravastatin  (PRAVACHOL ) tablet 40 mg  40 mg Oral QHS Krishnan, Gokul, MD   40 mg at 06/03/24 2255   valproic  acid (DEPAKENE ) 250 MG capsule 500 mg  500 mg Oral BID Khaliqdina, Salman, MD   500 mg at 06/04/24 1016     Discharge Medications: Please see discharge summary for a list of discharge medications.  Relevant Imaging Results:  Relevant Lab Results:   Additional Information SS#: [id number]  Almarie CHRISTELLA Goodie, LCSW     "

## 2024-06-04 NOTE — Plan of Care (Signed)
" °  Problem: Clinical Measurements: Goal: Ability to maintain clinical measurements within normal limits will improve Outcome: Progressing   Problem: Pain Managment: Goal: General experience of comfort will improve and/or be controlled Outcome: Progressing   Problem: Safety: Goal: Ability to remain free from injury will improve Outcome: Progressing   Problem: Skin Integrity: Goal: Risk for impaired skin integrity will decrease Outcome: Progressing   Problem: Nutrition: Goal: Adequate nutrition will be maintained Outcome: Not Progressing   "

## 2024-06-04 NOTE — Progress Notes (Signed)
 Pt refuses 0400 vitals , will not unbend arm , readings are inaccurate , pt states I will not unbend my arm , leave me alone . Provider on call notified of the above.

## 2024-06-04 NOTE — Plan of Care (Signed)
  Problem: Education: Goal: Knowledge of General Education information will improve Description: Including pain rating scale, medication(s)/side effects and non-pharmacologic comfort measures Outcome: Progressing   Problem: Activity: Goal: Risk for activity intolerance will decrease Outcome: Progressing   Problem: Nutrition: Goal: Adequate nutrition will be maintained Outcome: Progressing   Problem: Coping: Goal: Level of anxiety will decrease Outcome: Progressing   Problem: Elimination: Goal: Will not experience complications related to bowel motility Outcome: Progressing Goal: Will not experience complications related to urinary retention Outcome: Progressing   Problem: Safety: Goal: Ability to remain free from injury will improve Outcome: Progressing   Problem: Skin Integrity: Goal: Risk for impaired skin integrity will decrease Outcome: Progressing   

## 2024-06-04 NOTE — Progress Notes (Signed)
 "  [facility] PROGRESS NOTE   Brandon Burke [facility] FMW:[id number] DOB: 10/10/1931 DOA: 06/01/2024  PCP: Leontine Phebe Charlotte ONEIDA, NP  Brief History: 89 y.o. male with a history of dementia, HTN, HLD, OSA, history of PE no longer on anticoagulation due to recurrent falls, hypothyroidism, prostate CA s/p prostatectomy who presented from his living facility due to concern for seizure activity.  Patient was transferred to [facility] for evaluation by neurology.  Consultants: Neurology  Procedures: EEG  Subjective/Interval History: Patient is quite sleepy this morning.  Mumbles but does not answer any questions.    Assessment/Plan:  Seizures Appears to be new onset.   Patient underwent CT head followed by MRI brain.  MRI brain does not show any acute findings but several chronic changes were noted. Patient also underwent EEG. Seen by neurology.  Patient has been started on Depakote.  They have communicated with patient's family.  No further workup at this time. Home health is recommended by PT.  Acute metabolic encephalopathy in the setting of dementia Some degree of sundowning is present.  This is in the setting of known dementia.  No focal neurological deficits noted.  Somnolent this morning.  Discussed with nursing staff who will reevaluate him this morning.  Hypothyroidism Continue levothyroxine .  History of depression Should be able to resume his home medications.  He was on Lexapro  and amitriptyline . Caution with using amitriptyline  in elderly patient.  Since he has been started on Depakote we will discontinue his amitriptyline  to avoid excessive sedation.  History of DVT and PE No longer on anticoagulation due to frequent falls.  DVT Prophylaxis: SCDs Code Status: DNR Family Communication: No family at bedside.  Patient's brother was updated yesterday. Disposition Plan: He is from assisted living facility.  Home health ordered.  Hopefully discharge later today if  his mentation improves.   Medications: Scheduled:  amitriptyline   10 mg Oral QHS   aspirin   81 mg Oral Daily   atenolol   25 mg Oral Daily   donepezil   10 mg Oral QHS   dorzolamide -timolol   1 drop Both Eyes BID   escitalopram   20 mg Oral Daily   latanoprost   1 drop Both Eyes QHS   levothyroxine   50 mcg Oral QAC breakfast   pantoprazole   40 mg Oral Daily   polyethylene glycol  17 g Oral Daily   pravastatin   40 mg Oral QHS   valproic  acid  500 mg Oral BID   Continuous: PRN:acetaminophen  **OR** acetaminophen , mouth rinse   Objective:  Vital Signs  Vitals:   06/03/24 1504 06/03/24 2038 06/04/24 0038 06/04/24 0731  BP: 129/76 (!) 141/69 (!) 150/105 (!) 155/71  Pulse: 61 62 63 61  Resp: 16 18 17 17   Temp: 98.1 F (36.7 C) 98.5 F (36.9 C) 98.2 F (36.8 C) 98.4 F (36.9 C)  TempSrc: Oral Oral Oral Axillary  SpO2: 99% 100% 97% 100%  Weight:      Height:        Intake/Output Summary (Last 24 hours) at 06/04/2024 1053 Last data filed at 06/03/2024 2200 Gross per 24 hour  Intake 120 ml  Output 400 ml  Net -280 ml   Filed Weights   06/01/24 0933  Weight: 78 kg    General appearance: Somnolent this morning Resp: Clear to auscultation bilaterally.  Normal effort Cardio: S1-S2 is normal regular.  No S3-S4.  No rubs murmurs or bruit GI: Abdomen is soft.  Nontender nondistended.  Bowel sounds are present normal.  No  masses organomegaly   Lab Results:  Data Reviewed: I have personally reviewed following labs and reports of the imaging studies  CBC: Recent Labs  Lab 06/01/24 1012 06/02/24 1011  WBC 5.6 8.6  NEUTROABS 4.3 6.3  HGB 14.1 15.4  HCT 43.7 45.1  MCV 101.4* 96.4  PLT 173 205    Basic Metabolic Panel: Recent Labs  Lab 06/01/24 1012 06/02/24 1011 06/04/24 0240  NA 143 136 139  K 4.2 5.3* 3.8  CL 104 104 101  CO2 22 19* 28  GLUCOSE 109* 90 82  BUN 18 15 24*  CREATININE 1.16 1.18 1.23  CALCIUM  9.0 9.6 9.4  MG  --   --  2.4    GFR: Estimated  Creatinine Clearance: 44 mL/min (by C-G formula based on SCr of 1.23 mg/dL).  Liver Function Tests: Recent Labs  Lab 06/01/24 1012 06/02/24 1011  AST 24 45*  ALT 15 18  ALKPHOS 101 112  BILITOT 0.5 0.8  PROT 6.9 7.4  ALBUMIN 4.1 3.7    Thyroid  Function Tests: Recent Labs    06/01/24 1157  TSH 1.660    Radiology Studies: No results found.      LOS: 0 days   Brandon Burke [employer] on [email or web address]  06/04/2024, 10:53 AM   "

## 2024-06-04 NOTE — Plan of Care (Signed)

## 2024-06-05 ENCOUNTER — Other Ambulatory Visit (HOSPITAL_COMMUNITY): Payer: Self-pay

## 2024-06-05 DIAGNOSIS — G9341 Metabolic encephalopathy: Secondary | ICD-10-CM | POA: Diagnosis not present

## 2024-06-05 DIAGNOSIS — R569 Unspecified convulsions: Secondary | ICD-10-CM | POA: Diagnosis not present

## 2024-06-05 LAB — GLUCOSE, CAPILLARY: Glucose-Capillary: 135 mg/dL — ABNORMAL HIGH (ref 70–99)

## 2024-06-05 LAB — VALPROIC ACID LEVEL: Valproic Acid Lvl: 60 ug/mL (ref 50–100)

## 2024-06-05 NOTE — Plan of Care (Signed)
" °  Problem: Nutrition: Goal: Adequate nutrition will be maintained 06/05/2024 0214 by Peri Magdalena LABOR, RN Outcome: Progressing 06/04/2024 1937 by Peri Magdalena LABOR, RN Outcome: Progressing   Problem: Activity: Goal: Risk for activity intolerance will decrease 06/05/2024 0214 by Peri Magdalena LABOR, RN Outcome: Progressing 06/04/2024 1937 by Peri Magdalena LABOR, RN Outcome: Progressing   Problem: Coping: Goal: Level of anxiety will decrease 06/05/2024 0214 by Peri Magdalena LABOR, RN Outcome: Progressing 06/04/2024 1937 by Peri Magdalena LABOR, RN Outcome: Progressing   Problem: Elimination: Goal: Will not experience complications related to bowel motility 06/05/2024 0214 by Peri Magdalena LABOR, RN Outcome: Progressing 06/04/2024 1937 by Peri Magdalena LABOR, RN Outcome: Progressing Goal: Will not experience complications related to urinary retention 06/05/2024 0214 by Peri Magdalena LABOR, RN Outcome: Progressing 06/04/2024 1937 by Peri Magdalena LABOR, RN Outcome: Progressing   Problem: Pain Managment: Goal: General experience of comfort will improve and/or be controlled 06/05/2024 0214 by Peri Magdalena LABOR, RN Outcome: Progressing 06/04/2024 1937 by Peri Magdalena LABOR, RN Outcome: Progressing   Problem: Safety: Goal: Ability to remain free from injury will improve 06/05/2024 0214 by Peri Magdalena LABOR, RN Outcome: Progressing 06/04/2024 1937 by Peri Magdalena LABOR, RN Outcome: Progressing   Problem: Skin Integrity: Goal: Risk for impaired skin integrity will decrease 06/05/2024 0214 by Peri Magdalena LABOR, RN Outcome: Progressing 06/04/2024 1937 by Peri Magdalena LABOR, RN Outcome: Progressing   "

## 2024-06-06 ENCOUNTER — Other Ambulatory Visit (HOSPITAL_COMMUNITY): Payer: Self-pay

## 2024-06-06 LAB — CBC
HCT: 46.3 % (ref 39.0–52.0)
Hemoglobin: 15.8 g/dL (ref 13.0–17.0)
MCH: 32.8 pg (ref 26.0–34.0)
MCHC: 34.1 g/dL (ref 30.0–36.0)
MCV: 96.1 fL (ref 80.0–100.0)
Platelets: 171 10*3/uL (ref 150–400)
RBC: 4.82 MIL/uL (ref 4.22–5.81)
RDW: 12.1 % (ref 11.5–15.5)
WBC: 7.9 10*3/uL (ref 4.0–10.5)
nRBC: 0 % (ref 0.0–0.2)

## 2024-06-06 LAB — COMPREHENSIVE METABOLIC PANEL WITH GFR
ALT: 14 U/L (ref 0–44)
AST: 33 U/L (ref 15–41)
Albumin: 3.5 g/dL (ref 3.5–5.0)
Alkaline Phosphatase: 98 U/L (ref 38–126)
Anion gap: 9 (ref 5–15)
BUN: 45 mg/dL — ABNORMAL HIGH (ref 8–23)
CO2: 26 mmol/L (ref 22–32)
Calcium: 8.8 mg/dL — ABNORMAL LOW (ref 8.9–10.3)
Chloride: 100 mmol/L (ref 98–111)
Creatinine, Ser: 1.48 mg/dL — ABNORMAL HIGH (ref 0.61–1.24)
GFR, Estimated: 45 mL/min — ABNORMAL LOW
Glucose, Bld: 103 mg/dL — ABNORMAL HIGH (ref 70–99)
Potassium: 4.5 mmol/L (ref 3.5–5.1)
Sodium: 136 mmol/L (ref 135–145)
Total Bilirubin: 0.4 mg/dL (ref 0.0–1.2)
Total Protein: 6.5 g/dL (ref 6.5–8.1)

## 2024-06-06 LAB — AMMONIA: Ammonia: 16 umol/L (ref 9–35)

## 2024-06-06 MED ORDER — SODIUM CHLORIDE 0.9 % IV SOLN: INTRAVENOUS | Status: AC

## 2024-06-06 NOTE — Progress Notes (Addendum)
 " PROGRESS NOTE  Brandon Burke [facility]  FMW:[id number] DOB: [birth year] DOA: 06/01/2024 PCP: Leontine Phebe Charlotte ONEIDA, NP   Brief Narrative: Patient is a 89 year old male with history of dementia, hypertension, hyperlipidemia, OSA, history of PE, no longer on anticoagulant ,recurrent falls, hypothyroidism, prostate cancer status post prostatectomy who was brought here for the concern of seizure activity.  Neurology consulted.  Started on Depakote.  Has been seizure-free now.  TOC following for SNF placement.  Medically stable for discharge whenever possible  Assessment & Plan:  Principal Problem:   Acute metabolic encephalopathy Active Problems:   Dyslipidemia   History of pulmonary embolism   PROSTATE CANCER, HX OF   OSA (obstructive sleep apnea)   Hypothyroidism   Depression, recurrent   Moderate dementia (HCC)  Seizures: Appear to be new onset.  Underwent CT/MRI of the brain which did not show any acute findings but showed chronic changes.  Also underwent EEG.  Seen by neurology.  Currently on Depakote which we will continue.  Acute metabolic encephalopathy in the setting of dementia: No focal deficits.  As per the report, he became somnolent 2/2.  This morning, he was alert and awake, sitting on the chair.  Currently without any distress.  Continue delirium precautions, frequent reorientation.  Ammonia level normal  Mild AKI: Creatinine up to the range of 1.4.  Started on gentle IV fluids.  BMP tomorrow  Hypothyroidism: Continue levothyroxine   History of depression: Continue Lexapro .  Amitriptyline  discontinued due to somnolence  History of DVT/PE: Currently not on anticoagulation due to frequent falls  DVT/deconditioning: PT recommending SNF at discharge.  TOC following        DVT prophylaxis:SCDs Start: 06/01/24 2123     Code Status: Limited: Do not attempt resuscitation (DNR) -DNR-LIMITED -Do Not Intubate/DNI   Family Communication: None at bedside  Patient  status:Inpatient  Patient is from :Home  Anticipated discharge to:SnF  Estimated DC date:whenever possible   Consultants: Neurology  Procedures:None  Antimicrobials:  Anti-infectives (From admission, onward)    None       Subjective: Patient seen and examined at bedside today.  Hemodynamically stable.  Sitting on the chair today.  Does not appear to be somnolent.  Alert and awake but confused.  Not in any Distress.  Objective: Vitals:   06/05/24 2116 06/06/24 0045 06/06/24 0456 06/06/24 0807  BP: (!) 117/41 (!) 122/59 (!) 126/56 (P) 103/86  Pulse: 67 71 77 (P) 74  Resp: 16 18 16  (P) 18  Temp: 98.6 F (37 C) 98.1 F (36.7 C) 98 F (36.7 C) (P) 98.9 F (37.2 C)  TempSrc: Oral Oral  (P) Oral  SpO2: 95% 94% (!) 89% (P) 95%  Weight:      Height:        Intake/Output Summary (Last 24 hours) at 06/06/2024 1047 Last data filed at 06/06/2024 0524 Gross per 24 hour  Intake 810 ml  Output 625 ml  Net 185 ml   Filed Weights   06/01/24 0933  Weight: 78 kg    Examination:  General exam: Overall comfortable, not in distress HEENT: PERRL, chronic lesion on the right parietal scalp Respiratory system:  no wheezes or crackles  Cardiovascular system: S1 & S2 heard, RRR.  Gastrointestinal system: Abdomen is nondistended, soft and nontender. Central nervous system: Alert and awake  but not oriented Extremities: No edema, no clubbing ,no cyanosis Skin: No rashes, no ulcers,no icterus     Data Reviewed: I have personally reviewed following labs and imaging studies  CBC: Recent Labs  Lab 06/01/24 1012 06/02/24 1011 06/06/24 0246  WBC 5.6 8.6 7.9  NEUTROABS 4.3 6.3  --   HGB 14.1 15.4 15.8  HCT 43.7 45.1 46.3  MCV 101.4* 96.4 96.1  PLT 173 205 171   Basic Metabolic Panel: Recent Labs  Lab 06/01/24 1012 06/02/24 1011 06/04/24 0240 06/06/24 0246  NA 143 136 139 136  K 4.2 5.3* 3.8 4.5  CL 104 104 101 100  CO2 22 19* 28 26  GLUCOSE 109* 90 82 103*  BUN 18  15 24* 45*  CREATININE 1.16 1.18 1.23 1.48*  CALCIUM  9.0 9.6 9.4 8.8*  MG  --   --  2.4  --      No results found for this or any previous visit (from the past 240 hours).   Radiology Studies: No results found.  Scheduled Meds:  aspirin   81 mg Oral Daily   atenolol   25 mg Oral Daily   donepezil   10 mg Oral QHS   dorzolamide -timolol   1 drop Both Eyes BID   escitalopram   20 mg Oral Daily   latanoprost   1 drop Both Eyes QHS   levothyroxine   50 mcg Oral QAC breakfast   pantoprazole   40 mg Oral Daily   polyethylene glycol  17 g Oral Daily   pravastatin   40 mg Oral QHS   valproic  acid  500 mg Oral BID   Continuous Infusions:  sodium chloride        LOS: 1 day   Ivonne Mustache, MD [facility] P2/08/2024, 10:47 AM  "

## 2024-06-06 NOTE — Progress Notes (Signed)
 Notified on call Dr. ONEIDA. Opyd of reduced urine output and new labs of BUN 45 and Cr 1.48. Pt remains lethargic, drinking only a small amount when prompted and encouraged. Pt has poor PO intake at night. Dr. Charlton defers orders to Dr. Verdene in am.

## 2024-06-06 NOTE — TOC Progression Note (Signed)
 Transition of Care [facility]) - Progression Note    Patient Details  Name: Brandon Burke MRN: [id number] Date of Birth: 1931-09-02  [facility] CM/SW Contact  Almarie CHRISTELLA Goodie, [place] Phone Number: 06/06/2024, 3:08 PM  Clinical Narrative:   CSW updated by MD that patient more awake today. CSW coordinated with acute rehab for updated PT note for patient's care needs now that he is awake. CSW faxed out referral again with updated information. CSW spoke with patient's brother, [name], and SIL on speakerphone to discuss. Patient's family is not interested in [facility], patient has no other bed offers in [place]. Family willing to look into options in [place] if it means that the patient will receive better care. CSW faxed out referral again, will update family with offers. CSW spoke with [employer] to update that the family still has not chosen a SNF, will update them when a SNF has been chosen for authorization.    Expected Discharge Plan: Skilled Nursing Facility Barriers to Discharge: Continued Medical Work up               Expected Discharge Plan and Services                                               Social Drivers of Health (SDOH) Interventions SDOH Screenings   Food Insecurity: Patient Unable To Answer (06/05/2024)  Housing: Unknown (06/05/2024)  Transportation Needs: Patient Unable To Answer (06/05/2024)  Depression (PHQ2-9): Low Risk (09/27/2023)  Financial Resource Strain: Low Risk (06/24/2023)  Physical Activity: Unknown (06/24/2023)  Social Connections: Unknown (06/24/2023)  Stress: No Stress Concern Present (06/24/2023)  Tobacco Use: Low Risk (06/01/2024)    Readmission Risk Interventions     No data to display

## 2024-06-06 NOTE — Progress Notes (Signed)
 Occupational Therapy Treatment Patient Details Name: SAJJAD HONEA MRN: [id number] DOB: [birth year] Today's Date: 06/06/2024   History of present illness Pt is a 89 y.o. M who presented to [facility] ED from ALF d/t c/f seizure activity. Triage note reports grand mal seizure activity while sitting in a chair. Per EMS, he was post ictal. CT showed a scalp hematoma, brain atrophy and small vessel disease without acute intracranial findings. UA negative. MRI showed scattered foci of susceptibility over the high frontal and parietal convexities, chronic cerebellar infarcts (L>R), and advanced atrophy. EEG suggestive of generalized cerebral dysfunction (encephalopathy). PMHx: dementia, HTN, HLD, OSA, history of PE no longer on anticoagulation due to recurrent falls, hypothyroidism, prostate CA s/p prostatectomy.   OT comments  Pt progressing well towards goals. Pt with improved command following and arousal level from initial eval. Pt only oriented towards self, and with limited verbalizations during session, mostly yes/no responses. Improved sitting balance EOB to min to CGA. Progressed to complete self-feeding and grooming with set up assist. Bed to chair transfer was mod +2 assist for STS, with min assist for step pivot to manage RW. Pt continues to be limited by decreased initiation, sequencing, strength, and balance. Continue to recommend <3 hours of skilled rehab daily to optimize independence levels. Will continue to follow acutely.       If plan is discharge home, recommend the following:  Two people to help with walking and/or transfers;Two people to help with bathing/dressing/bathroom;Supervision due to cognitive status   Equipment Recommendations  Other (comment) (Defer to next venue)       Precautions / Restrictions Precautions Precautions: Fall Recall of Precautions/Restrictions: Impaired Restrictions Weight Bearing Restrictions Per Provider Order: No       Mobility Bed  Mobility Overal bed mobility: Needs Assistance Bed Mobility: Supine to Sit     Supine to sit: Mod assist, +2 for physical assistance, +2 for safety/equipment, HOB elevated, Used rails     General bed mobility comments: Increased time for cueing, mod HH assist to manage trunk    Transfers Overall transfer level: Needs assistance Equipment used: Rolling walker (2 wheels) Transfers: Sit to/from Stand, Bed to chair/wheelchair/BSC Sit to Stand: Mod assist, +2 physical assistance, +2 safety/equipment     Step pivot transfers: Min assist, +2 safety/equipment     General transfer comment: Mod +2 to power up to stand, with tactile cueing for hand placement on RW. Min assist to manage RW and cue with steps     Balance Overall balance assessment: Needs assistance Sitting-balance support: Feet supported Sitting balance-Leahy Scale: Poor Sitting balance - Comments: Slight posterior lean, CGA to min assist to maintain upright   Standing balance support: Bilateral upper extremity supported, During functional activity, Reliant on assistive device for balance Standing balance-Leahy Scale: Poor Standing balance comment: Reliant on RW and external support       ADL either performed or assessed with clinical judgement   ADL Overall ADL's : Needs assistance/impaired Eating/Feeding: Set up;Sitting Eating/Feeding Details (indicate cue type and reason): with back supported Grooming: Set up;Bed level Grooming Details (indicate cue type and reason): increased time for task initation   Toilet Transfer: Moderate assistance;+2 for physical assistance;+2 for safety/equipment;Rolling walker (2 wheels) Toilet Transfer Details (indicate cue type and reason): short step pivot to chair         Functional mobility during ADLs: Moderate assistance;+2 for physical assistance;+2 for safety/equipment;Rolling walker (2 wheels)      Extremity/Trunk Assessment Upper Extremity Assessment Upper Extremity  Assessment:  Generalized weakness (use BUE functionally, cog limiting formal testing)   Lower Extremity Assessment Lower Extremity Assessment: Defer to PT evaluation   Cervical / Trunk Assessment Cervical / Trunk Assessment: Kyphotic    Vision   Vision Assessment?: Vision impaired- to be further tested in functional context Additional Comments: TBA d/t cog. Appears to have limited peripheral visual field, TBA         Communication Communication Communication: Impaired Factors Affecting Communication: Hearing impaired   Cognition Arousal: Lethargic Behavior During Therapy: Flat affect Cognition: No family/caregiver present to determine baseline, History of cognitive impairments, Cognition impaired   Orientation impairments: Place, Time, Situation Awareness: Online awareness impaired, Intellectual awareness impaired Memory impairment (select all impairments): Short-term memory, Working civil service fast streamer, Non-declarative long-term memory, Engineer, structural memory Attention impairment (select first level of impairment): Selective attention, Alternating attention Executive functioning impairment (select all impairments): Organization, Sequencing, Reasoning, Problem solving, Initiation OT - Cognition Comments: Hx of dementia, no family present for baseline. Pt only oriented towards self, delayed initation and sequencing. Able to sustain attention for goal directed task such as self-feeding       Following commands: Impaired Following commands impaired: Follows one step commands inconsistently, Follows one step commands with increased time      Cueing   Cueing Techniques: Verbal cues, Gestural cues, Tactile cues, Visual cues        General Comments Pt with L parietal scalp laceration    Pertinent Vitals/ Pain       Pain Assessment Pain Assessment: No/denies pain Pain Intervention(s): Monitored during session   Frequency  Min 2X/week        Progress Toward Goals  OT  Goals(current goals can now be found in the care plan section)  Progress towards OT goals: Progressing toward goals  Acute Rehab OT Goals Patient Stated Goal: Did not state OT Goal Formulation: Patient unable to participate in goal setting Time For Goal Achievement: 06/17/24 Potential to Achieve Goals: Fair ADL Goals Pt Will Perform Eating: with set-up;bed level Pt Will Perform Grooming: with min assist;sitting Pt/caregiver will Perform Home Exercise Program: Both right and left upper extremity;With minimal assist Additional ADL Goal #1: Pt will sit EOB with no more than CGA in prepration for ADLs EOB.  Plan         AM-PAC OT 6 Clicks Daily Activity     Outcome Measure   Help from another person eating meals?: A Little Help from another person taking care of personal grooming?: A Little Help from another person toileting, which includes using toliet, bedpan, or urinal?: Total Help from another person bathing (including washing, rinsing, drying)?: A Lot Help from another person to put on and taking off regular upper body clothing?: A Lot Help from another person to put on and taking off regular lower body clothing?: A Lot 6 Click Score: 13    End of Session Equipment Utilized During Treatment: Gait belt;Rolling walker (2 wheels)  OT Visit Diagnosis: Other abnormalities of gait and mobility (R26.89);Muscle weakness (generalized) (M62.81);Other symptoms and signs involving cognitive function   Activity Tolerance Patient tolerated treatment well   Patient Left in chair;with call bell/phone within reach;with chair alarm set   Nurse Communication Mobility status        Time: 9169-9144 OT Time Calculation (min): 25 min  Charges: OT General Charges $OT Visit: 1 Visit OT Treatments $Self Care/Home Management : 23-37 mins  Adrianne BROCKS, OT  Acute Rehabilitation Services Office [phone] Secure chat preferred   Adrianne GORMAN Savers 06/06/2024, 10:24  AM

## 2024-06-06 NOTE — Plan of Care (Signed)
 Assisted to chair with PT. Assisted with meals. Sleeping most of the day. IVF started as ordered.    Problem: Education: Goal: Knowledge of General Education information will improve Description: Including pain rating scale, medication(s)/side effects and non-pharmacologic comfort measures Outcome: Progressing   Problem: Activity: Goal: Risk for activity intolerance will decrease Outcome: Progressing   Problem: Nutrition: Goal: Adequate nutrition will be maintained Outcome: Progressing   Problem: Pain Managment: Goal: General experience of comfort will improve and/or be controlled Outcome: Progressing   Problem: Skin Integrity: Goal: Risk for impaired skin integrity will decrease Outcome: Progressing

## 2024-06-06 NOTE — Progress Notes (Signed)
 Physical Therapy Treatment Patient Details Name: Brandon Burke MRN: [id number] DOB: 11-Oct-1931 Today's Date: 06/06/2024   History of Present Illness Pt is a 89 y.o. M who presented to [facility] ED from ALF d/t c/f seizure activity. Triage note reports grand mal seizure activity while sitting in a chair. Per EMS, he was post ictal. CT showed a scalp hematoma, brain atrophy and small vessel disease without acute intracranial findings. UA negative. MRI showed scattered foci of susceptibility over the high frontal and parietal convexities, chronic cerebellar infarcts (L>R), and advanced atrophy. EEG suggestive of generalized cerebral dysfunction (encephalopathy). PMHx: dementia, HTN, HLD, OSA, history of PE no longer on anticoagulation due to recurrent falls, hypothyroidism, prostate CA s/p prostatectomy.    PT Comments  Slow progress towards goals due to poor endurance and activity tolerance. Pt gets fatigued sitting up in chair working on endurance. Pt had been up for 4 hours prior to physical therapy session in chair. Currently pt was unable to get fully to standing with Max A and RW pt was able to assist at [facility] A with squat pivot transfer and was mIn A for getting from sitting to supine. Due to pt current functional status, home set up and available assistance at home recommending skilled physical therapy services < 3 hours/day in order to address strength, balance and functional mobility to decrease risk for falls, injury, immobility, skin break down and re-hospitalization.     If plan is discharge home, recommend the following: Supervision due to cognitive status;Assistance with cooking/housework;Assist for transportation;Two people to help with walking and/or transfers;Help with stairs or ramp for entrance   Can travel by private vehicle     No  Equipment Recommendations  Wheelchair (measurements PT);Wheelchair cushion (measurements PT);Hospital bed;Hoyer lift       Precautions /  Restrictions Precautions Precautions: Fall Recall of Precautions/Restrictions: Impaired Restrictions Weight Bearing Restrictions Per Provider Order: No     Mobility  Bed Mobility Overal bed mobility: Needs Assistance Bed Mobility: Sit to Supine       Sit to supine: Min assist   General bed mobility comments: Min A for LE to get to supine    Transfers Overall transfer level: Needs assistance Equipment used: Rolling walker (2 wheels), None Transfers: Sit to/from Stand, Bed to chair/wheelchair/BSC Sit to Stand: Total assist     Squat pivot transfers: Max assist     General transfer comment: Attempted standing with RW 2x at Max A but unable to get fully to standing due to heavy posterior lean. Performed Max A Squat pivot from recliner to EOB with face to face technique using gait belt.    Ambulation/Gait   General Gait Details: Pt was unable to progress gait at this time.      Balance Overall balance assessment: Needs assistance Sitting-balance support: Feet supported Sitting balance-Leahy Scale: Fair   Postural control: Posterior lean Standing balance support: Bilateral upper extremity supported, During functional activity, Reliant on assistive device for balance Standing balance-Leahy Scale: Poor Standing balance comment: reliant on external support.      Communication Communication Communication: Impaired Factors Affecting Communication: Hearing impaired  Cognition Arousal: Lethargic Behavior During Therapy: Flat affect   PT - Cognitive impairments: No family/caregiver present to determine baseline, History of cognitive impairments, Difficult to assess   Orientation impairments: Place, Time, Situation     PT - Cognition Comments: pt is able to follow ~50% of commands Following commands: Impaired Following commands impaired: Follows one step commands inconsistently, Follows one step commands with  increased time    Cueing Cueing Techniques: Verbal cues,  Gestural cues, Tactile cues, Visual cues     General Comments General comments (skin integrity, edema, etc.): Pt with L parietal scalp laceration. No signs/symptoms of cardiac/respiratory distress during session.      Pertinent Vitals/Pain Pain Assessment Pain Assessment: Faces Faces Pain Scale: No hurt Breathing: normal Negative Vocalization: none Facial Expression: smiling or inexpressive Body Language: relaxed Consolability: no need to console PAINAD Score: 0 Pain Intervention(s): Monitored during session     PT Goals (current goals can now be found in the care plan section) Acute Rehab PT Goals PT Goal Formulation: Patient unable to participate in goal setting Time For Goal Achievement: 06/16/24 Potential to Achieve Goals: Fair Progress towards PT goals: Progressing toward goals    Frequency    Min 2X/week      PT Plan  Continue with current POC        AM-PAC PT 6 Clicks Mobility   Outcome Measure  Help needed turning from your back to your side while in a flat bed without using bedrails?: A Lot Help needed moving from lying on your back to sitting on the side of a flat bed without using bedrails?: A Lot Help needed moving to and from a bed to a chair (including a wheelchair)?: A Lot Help needed standing up from a chair using your arms (e.g., wheelchair or bedside chair)?: Total Help needed to walk in hospital room?: Total Help needed climbing 3-5 steps with a railing? : Total 6 Click Score: 9    End of Session Equipment Utilized During Treatment: Gait belt Activity Tolerance: Patient tolerated treatment well Patient left: with call bell/phone within reach;in bed;with bed alarm set Nurse Communication: Mobility status PT Visit Diagnosis: Muscle weakness (generalized) (M62.81);Unsteadiness on feet (R26.81);Difficulty in walking, not elsewhere classified (R26.2)     Time: 8698-8679 PT Time Calculation (min) (ACUTE ONLY): 19 min  Charges:     $Therapeutic Activity: 8-22 mins PT General Charges $$ ACUTE PT VISIT: 1 Visit                     Dorothyann Maier, DPT, CLT  Acute Rehabilitation Services Office: [phone] (Secure chat preferred)    Dorothyann VEAR Maier 06/06/2024, 1:44 PM

## 2024-06-07 LAB — BASIC METABOLIC PANEL WITH GFR
Anion gap: 11 (ref 5–15)
BUN: 36 mg/dL — ABNORMAL HIGH (ref 8–23)
CO2: 21 mmol/L — ABNORMAL LOW (ref 22–32)
Calcium: 8.6 mg/dL — ABNORMAL LOW (ref 8.9–10.3)
Chloride: 104 mmol/L (ref 98–111)
Creatinine, Ser: 1.37 mg/dL — ABNORMAL HIGH (ref 0.61–1.24)
GFR, Estimated: 49 mL/min — ABNORMAL LOW
Glucose, Bld: 94 mg/dL (ref 70–99)
Potassium: 4.7 mmol/L (ref 3.5–5.1)
Sodium: 136 mmol/L (ref 135–145)

## 2024-06-07 NOTE — TOC Progression Note (Signed)
 Transition of Care [facility]) - Progression Note    Patient Details  Name: Brandon Burke MRN: [id number] Date of Birth: [birth year]  Transition of [facility]) CM/SW Contact  Almarie CHRISTELLA Goodie, [place] Phone Number: 06/07/2024, 3:54 PM  Clinical Narrative:   CSW spoke with patient's brother, [name], and SIL Erminio to provide and discuss bed offers. Family reviewed and chose [facility]. CSW spoke with [facility], they are awaiting bed availability for patient, will check in again tomorrow. CSW spoke with [facility] to provide confirmation of SNF choice. CSW to follow.    Expected Discharge Plan: Skilled Nursing Facility Barriers to Discharge: Continued Medical Work up               Expected Discharge Plan and Services                                               Social Drivers of Health (SDOH) Interventions SDOH Screenings   Food Insecurity: Patient Unable To Answer (06/05/2024)  Housing: Unknown (06/05/2024)  Transportation Needs: Patient Unable To Answer (06/05/2024)  Depression (PHQ2-9): Low Risk (09/27/2023)  Financial Resource Strain: Low Risk (06/24/2023)  Physical Activity: Unknown (06/24/2023)  Social Connections: Unknown (06/24/2023)  Stress: No Stress Concern Present (06/24/2023)  Tobacco Use: Low Risk (06/01/2024)    Readmission Risk Interventions     No data to display

## 2024-06-07 NOTE — Progress Notes (Signed)
 " PROGRESS NOTE  Makarios Madlock [facility]  FMW:[id number] DOB: 1932-04-29 DOA: 06/01/2024 PCP: Leontine Phebe Charlotte ONEIDA, NP   Brief Narrative: Patient is a 89 year old male with history of dementia, hypertension, hyperlipidemia, OSA, history of PE, no longer on anticoagulant ,recurrent falls, hypothyroidism, prostate cancer status post prostatectomy who was brought here for the concern of seizure activity.  Neurology consulted.  Started on Depakote.  Has been seizure-free now.  TOC following for SNF placement.  Medically stable for discharge whenever possible  Assessment & Plan:  Principal Problem:   Acute metabolic encephalopathy Active Problems:   Dyslipidemia   History of pulmonary embolism   PROSTATE CANCER, HX OF   OSA (obstructive sleep apnea)   Hypothyroidism   Depression, recurrent   Moderate dementia (HCC)  Seizures: Appear to be new onset.  Underwent CT/MRI of the brain which did not show any acute findings but showed chronic changes.  Also underwent EEG.  Seen by neurology.  Currently on Depakote which we will continue.  Acute metabolic encephalopathy in the setting of dementia: No focal deficits.  As per the report, he became somnolent 2/2.  This morning, he was alert and awake, sitting on the chair.  Currently without any distress.  Continue delirium precautions, frequent reorientation.  Ammonia level normal  Mild AKI: Creatinine up to the range of 1.4.  Started on gentle IV fluids.Improved  Hypothyroidism: Continue levothyroxine   History of depression: Continue Lexapro .  Amitriptyline  discontinued due to somnolence  History of DVT/PE: Currently not on anticoagulation due to frequent falls  DVT/deconditioning: PT recommending SNF at discharge.  TOC following        DVT prophylaxis:SCDs Start: 06/01/24 2123     Code Status: Limited: Do not attempt resuscitation (DNR) -DNR-LIMITED -Do Not Intubate/DNI   Family Communication: Called and discussed with brother [name] on phone on  2/5  Patient status:Inpatient  Patient is from :Home  Anticipated discharge to:SnF  Estimated DC date:whenever possible   Consultants: Neurology  Procedures:None  Antimicrobials:  Anti-infectives (From admission, onward)    None       Subjective: Patient seen and examined at bedside today.  Hemodynamically stable.  Sitting on the bed.  Sleeping.  Talks when called his name.  Denies any complaints.  Remains confused.  Not agitated.  Not in any current distress  Objective: Vitals:   06/07/24 0054 06/07/24 0428 06/07/24 0713 06/07/24 1104  BP: (!) 106/59 (!) 123/51 (!) 101/42 (!) 101/42  Pulse: 71 80 74 74  Resp: 18 17 17    Temp: 98.8 F (37.1 C) 98.4 F (36.9 C) 98.2 F (36.8 C)   TempSrc: Oral Oral Oral   SpO2: 95% 93% 93%   Weight:      Height:        Intake/Output Summary (Last 24 hours) at 06/07/2024 1124 Last data filed at 06/07/2024 0900 Gross per 24 hour  Intake 540 ml  Output 500 ml  Net 40 ml   Filed Weights   06/01/24 0933  Weight: 78 kg    Examination:  General exam: Overall comfortable, not in distress HEENT: PERRL, chronic skin lesion on the left parietal scalp Respiratory system:  no wheezes or crackles  Cardiovascular system: S1 & S2 heard, RRR.  Gastrointestinal system: Abdomen is nondistended, soft and nontender. Central nervous system: Alert but not oriented Extremities: No edema, no clubbing ,no cyanosis Skin: No rashes, no ulcers,no icterus       Data Reviewed: I have personally reviewed following labs and imaging studies  CBC: Recent Labs  Lab 06/01/24 1012 06/02/24 1011 06/06/24 0246  WBC 5.6 8.6 7.9  NEUTROABS 4.3 6.3  --   HGB 14.1 15.4 15.8  HCT 43.7 45.1 46.3  MCV 101.4* 96.4 96.1  PLT 173 205 171   Basic Metabolic Panel: Recent Labs  Lab 06/01/24 1012 06/02/24 1011 06/04/24 0240 06/06/24 0246 06/07/24 0235  NA 143 136 139 136 136  K 4.2 5.3* 3.8 4.5 4.7  CL 104 104 101 100 104  CO2 22 19* 28 26 21*   GLUCOSE 109* 90 82 103* 94  BUN 18 15 24* 45* 36*  CREATININE 1.16 1.18 1.23 1.48* 1.37*  CALCIUM  9.0 9.6 9.4 8.8* 8.6*  MG  --   --  2.4  --   --      No results found for this or any previous visit (from the past 240 hours).   Radiology Studies: No results found.  Scheduled Meds:  aspirin   81 mg Oral Daily   atenolol   25 mg Oral Daily   donepezil   10 mg Oral QHS   dorzolamide -timolol   1 drop Both Eyes BID   escitalopram   20 mg Oral Daily   latanoprost   1 drop Both Eyes QHS   levothyroxine   50 mcg Oral QAC breakfast   pantoprazole   40 mg Oral Daily   polyethylene glycol  17 g Oral Daily   pravastatin   40 mg Oral QHS   valproic  acid  500 mg Oral BID   Continuous Infusions:  sodium chloride  75 mL/hr at 06/07/24 0049     LOS: 2 days   Ivonne Mustache, MD [facility] P2/08/2024, 11:24 AM  "

## 2024-06-07 NOTE — Plan of Care (Signed)
 Assisted with eating per students. More verbal today. Wound on L head weeping.    Problem: Education: Goal: Knowledge of General Education information will improve Description: Including pain rating scale, medication(s)/side effects and non-pharmacologic comfort measures Outcome: Progressing   Problem: Activity: Goal: Risk for activity intolerance will decrease Outcome: Progressing   Problem: Nutrition: Goal: Adequate nutrition will be maintained Outcome: Progressing   Problem: Pain Managment: Goal: General experience of comfort will improve and/or be controlled Outcome: Progressing   Problem: Safety: Goal: Ability to remain free from injury will improve Outcome: Progressing   Problem: Skin Integrity: Goal: Risk for impaired skin integrity will decrease Outcome: Progressing

## 2024-06-08 ENCOUNTER — Other Ambulatory Visit (HOSPITAL_COMMUNITY): Payer: Self-pay

## 2024-06-08 MED ORDER — VALPROIC ACID 250 MG/5ML PO SOLN
500.0000 mg | Freq: Two times a day (BID) | ORAL | Status: AC
  Administered 2024-06-08: 500 mg via ORAL
  Filled 2024-06-08: qty 10

## 2024-06-08 NOTE — Progress Notes (Signed)
 " PROGRESS NOTE  [facility]  FMW:[id number] DOB: 11-05-1931 DOA: 06/01/2024 PCP: Leontine Phebe Charlotte ONEIDA, NP   Brief Narrative: Patient is a 89 year old male with history of dementia, hypertension, hyperlipidemia, OSA, history of PE, no longer on anticoagulant ,recurrent falls, hypothyroidism, prostate cancer status post prostatectomy who was brought here for the concern of seizure activity.  Neurology consulted.  Started on Depakote.  Has been seizure-free now.  TOC following for SNF placement.  Medically stable for discharge whenever possible  Assessment & Plan:  Principal Problem:   Acute metabolic encephalopathy Active Problems:   Dyslipidemia   History of pulmonary embolism   PROSTATE CANCER, HX OF   OSA (obstructive sleep apnea)   Hypothyroidism   Depression, recurrent   Moderate dementia (HCC)  Seizures: Appear to be new onset.  Underwent CT/MRI of the brain which did not show any acute findings but showed chronic changes.  Also underwent EEG.  Seen by neurology.  Currently on Depakote which we will continue.  Acute metabolic encephalopathy in the setting of dementia: No focal deficits.  Currently without any distress.  Continue delirium precautions, frequent reorientation.  Ammonia level normal.  Mentation fluctuating  Mild AKI: Creatinine up to the range of 1.4.  Started on gentle IV fluids.Improved  Hypothyroidism: Continue levothyroxine   History of depression: Continue Lexapro .  Amitriptyline  discontinued due to somnolence  History of DVT/PE: Currently not on anticoagulation due to frequent falls  DVT/deconditioning: PT recommending SNF at discharge.  TOC following        DVT prophylaxis:SCDs Start: 06/01/24 2123     Code Status: Limited: Do not attempt resuscitation (DNR) -DNR-LIMITED -Do Not Intubate/DNI   Family Communication: Called and discussed with brother [name] on phone on 2/5  Patient status:Inpatient  Patient is from :Home  Anticipated discharge  to:SnF  Estimated DC date:whenever possible   Consultants: Neurology  Procedures:None  Antimicrobials:  Anti-infectives (From admission, onward)    None       Subjective: Patient seen and examined at bedside today.  Hemodynamically stable.  He was sleeping.  Remains confused.  Not in any kind of distress.  No change from yesterday.  Objective: Vitals:   06/07/24 2100 06/08/24 0030 06/08/24 0506 06/08/24 0809  BP: (!) 136/58 (!) 134/58 (!) 112/55 123/61  Pulse: 80 78 72 70  Resp: 17 16 18  (!) 2  Temp: 99.2 F (37.3 C) 98.9 F (37.2 C) 98.3 F (36.8 C) 98.8 F (37.1 C)  TempSrc: Axillary Axillary Oral Oral  SpO2: 100% 95% 92% 93%  Weight:      Height:        Intake/Output Summary (Last 24 hours) at 06/08/2024 1134 Last data filed at 06/07/2024 2344 Gross per 24 hour  Intake 360 ml  Output 600 ml  Net -240 ml   Filed Weights   06/01/24 0933  Weight: 78 kg    Examination:  General exam: Overall comfortable, not in distress HEENT: PERRL, chronically skin lesion on the left parietal scalp Respiratory system:  no wheezes or crackles  Cardiovascular system: S1 & S2 heard, RRR.  Gastrointestinal system: Abdomen is nondistended, soft and nontender. Central nervous system: Sleeping, confused  extremities: No edema, no clubbing ,no cyanosis Skin: No rashes, no ulcers,no icterus       Data Reviewed: I have personally reviewed following labs and imaging studies  CBC: Recent Labs  Lab 06/02/24 1011 06/06/24 0246  WBC 8.6 7.9  NEUTROABS 6.3  --   HGB 15.4 15.8  HCT 45.1  46.3  MCV 96.4 96.1  PLT 205 171   Basic Metabolic Panel: Recent Labs  Lab 06/02/24 1011 06/04/24 0240 06/06/24 0246 06/07/24 0235  NA 136 139 136 136  K 5.3* 3.8 4.5 4.7  CL 104 101 100 104  CO2 19* 28 26 21*  GLUCOSE 90 82 103* 94  BUN 15 24* 45* 36*  CREATININE 1.18 1.23 1.48* 1.37*  CALCIUM  9.6 9.4 8.8* 8.6*  MG  --  2.4  --   --      No results found for this or any  previous visit (from the past 240 hours).   Radiology Studies: No results found.  Scheduled Meds:  aspirin   81 mg Oral Daily   atenolol   25 mg Oral Daily   donepezil   10 mg Oral QHS   dorzolamide -timolol   1 drop Both Eyes BID   escitalopram   20 mg Oral Daily   latanoprost   1 drop Both Eyes QHS   levothyroxine   50 mcg Oral QAC breakfast   pantoprazole   40 mg Oral Daily   polyethylene glycol  17 g Oral Daily   pravastatin   40 mg Oral QHS   valproic  acid  500 mg Oral BID   Continuous Infusions:     LOS: 3 days   Ivonne Mustache, MD [facility] P2/10/2024, 11:34 AM  "

## 2024-06-08 NOTE — TOC Progression Note (Addendum)
 Transition of Care [facility]) - Progression Note    Patient Details  Name: Brandon Burke MRN: [id number] Date of Birth: [birth year]  [facility] CM/SW Contact  Almarie CHRISTELLA Goodie, [place] Phone Number: 06/08/2024, 4:27 PM  Clinical Narrative:   CSW checked with [facility], they still do not have any male beds available. CSW sent referral to additional facilities, patient received another bed offer at [facility]. CSW spoke with patient's brother and SIL to update. Family at this point feels like the patient would be better serviced with hospice services. Family has reached out to [facility] to see about admission at [facility], but there is no bed available. CSW discussed with family about seeing if patient could return to [facility] with hospice, that may be possible. Family would like to see if that is an option. CSW reached out to [facility] to ask about the possibility of patient returning with hospice, awaiting call back. CSW to follow.  UPDATE 4:39 PM: [facility] called back, they are not likely to be able to take the patient back. They will follow up on Monday for an assessment, but they report that they patient would have to transfer on his own. CSW updated brother and SIL via phone. CSW to follow.    Expected Discharge Plan: Skilled Nursing Facility Barriers to Discharge: Continued Medical Work up               Expected Discharge Plan and Services                                               Social Drivers of Health (SDOH) Interventions SDOH Screenings   Food Insecurity: Patient Unable To Answer (06/05/2024)  Housing: Unknown (06/05/2024)  Transportation Needs: Patient Unable To Answer (06/05/2024)  Depression (PHQ2-9): Low Risk (09/27/2023)  Financial Resource Strain: Low Risk (06/24/2023)  Physical Activity: Unknown (06/24/2023)  Social Connections: Unknown (06/24/2023)  Stress: No Stress Concern Present (06/24/2023)  Tobacco Use: Low  Risk (06/01/2024)    Readmission Risk Interventions     No data to display

## 2024-06-08 NOTE — Plan of Care (Signed)
